# Patient Record
Sex: Male | Born: 1950 | Race: White | Hispanic: No | Marital: Married | Smoking: Former smoker
Health system: Southern US, Community
[De-identification: ages and names within clinical notes are randomized; demographics above are authoritative.]

## PROBLEM LIST (undated history)

## (undated) DIAGNOSIS — E669 Obesity, unspecified: Secondary | ICD-10-CM

## (undated) DIAGNOSIS — I2510 Atherosclerotic heart disease of native coronary artery without angina pectoris: Secondary | ICD-10-CM

## (undated) DIAGNOSIS — M1990 Unspecified osteoarthritis, unspecified site: Secondary | ICD-10-CM

## (undated) DIAGNOSIS — E559 Vitamin D deficiency, unspecified: Secondary | ICD-10-CM

## (undated) DIAGNOSIS — I10 Essential (primary) hypertension: Secondary | ICD-10-CM

## (undated) DIAGNOSIS — K219 Gastro-esophageal reflux disease without esophagitis: Secondary | ICD-10-CM

## (undated) DIAGNOSIS — E119 Type 2 diabetes mellitus without complications: Secondary | ICD-10-CM

## (undated) DIAGNOSIS — Z973 Presence of spectacles and contact lenses: Secondary | ICD-10-CM

## (undated) DIAGNOSIS — E785 Hyperlipidemia, unspecified: Secondary | ICD-10-CM

## (undated) DIAGNOSIS — G4733 Obstructive sleep apnea (adult) (pediatric): Secondary | ICD-10-CM

## (undated) HISTORY — DX: Vitamin D deficiency, unspecified: E55.9

## (undated) HISTORY — DX: Type 2 diabetes mellitus without complications: E11.9

## (undated) HISTORY — DX: Atherosclerotic heart disease of native coronary artery without angina pectoris: I25.10

## (undated) HISTORY — DX: Obstructive sleep apnea (adult) (pediatric): G47.33

## (undated) HISTORY — DX: Hyperlipidemia, unspecified: E78.5

## (undated) HISTORY — DX: Gastro-esophageal reflux disease without esophagitis: K21.9

## (undated) HISTORY — DX: Obesity, unspecified: E66.9

## (undated) HISTORY — DX: Essential (primary) hypertension: I10

## (undated) HISTORY — PX: POLYPECTOMY: SHX149

---

## 1999-08-18 ENCOUNTER — Encounter (INDEPENDENT_AMBULATORY_CARE_PROVIDER_SITE_OTHER): Payer: Self-pay | Admitting: Specialist

## 1999-08-18 ENCOUNTER — Other Ambulatory Visit: Admission: RE | Admit: 1999-08-18 | Discharge: 1999-08-18 | Payer: Self-pay | Admitting: Gastroenterology

## 2001-07-24 ENCOUNTER — Encounter: Payer: Self-pay | Admitting: Internal Medicine

## 2001-07-24 ENCOUNTER — Ambulatory Visit (HOSPITAL_COMMUNITY): Admission: RE | Admit: 2001-07-24 | Discharge: 2001-07-24 | Payer: Self-pay | Admitting: Internal Medicine

## 2002-07-30 ENCOUNTER — Encounter: Payer: Self-pay | Admitting: Internal Medicine

## 2002-07-30 ENCOUNTER — Ambulatory Visit (HOSPITAL_COMMUNITY): Admission: RE | Admit: 2002-07-30 | Discharge: 2002-07-30 | Payer: Self-pay | Admitting: Internal Medicine

## 2003-08-05 ENCOUNTER — Ambulatory Visit (HOSPITAL_COMMUNITY): Admission: RE | Admit: 2003-08-05 | Discharge: 2003-08-05 | Payer: Self-pay | Admitting: Internal Medicine

## 2003-08-15 HISTORY — PX: CARDIAC CATHETERIZATION: SHX172

## 2003-08-15 HISTORY — PX: OTHER SURGICAL HISTORY: SHX169

## 2003-12-18 ENCOUNTER — Ambulatory Visit (HOSPITAL_COMMUNITY): Admission: RE | Admit: 2003-12-18 | Discharge: 2003-12-19 | Payer: Self-pay | Admitting: Cardiology

## 2004-06-09 ENCOUNTER — Ambulatory Visit (HOSPITAL_COMMUNITY): Admission: RE | Admit: 2004-06-09 | Discharge: 2004-06-09 | Payer: Self-pay | Admitting: Cardiology

## 2004-08-04 ENCOUNTER — Ambulatory Visit (HOSPITAL_COMMUNITY): Admission: RE | Admit: 2004-08-04 | Discharge: 2004-08-04 | Payer: Self-pay | Admitting: Internal Medicine

## 2005-08-03 ENCOUNTER — Ambulatory Visit (HOSPITAL_COMMUNITY): Admission: RE | Admit: 2005-08-03 | Discharge: 2005-08-03 | Payer: Self-pay | Admitting: Cardiology

## 2006-05-11 ENCOUNTER — Ambulatory Visit: Payer: Self-pay | Admitting: Pulmonary Disease

## 2006-08-15 ENCOUNTER — Ambulatory Visit (HOSPITAL_COMMUNITY): Admission: RE | Admit: 2006-08-15 | Discharge: 2006-08-15 | Payer: Self-pay | Admitting: Internal Medicine

## 2007-08-27 ENCOUNTER — Ambulatory Visit (HOSPITAL_COMMUNITY): Admission: RE | Admit: 2007-08-27 | Discharge: 2007-08-27 | Payer: Self-pay | Admitting: Internal Medicine

## 2008-09-21 ENCOUNTER — Ambulatory Visit (HOSPITAL_COMMUNITY): Admission: RE | Admit: 2008-09-21 | Discharge: 2008-09-21 | Payer: Self-pay | Admitting: Internal Medicine

## 2010-09-28 ENCOUNTER — Ambulatory Visit (HOSPITAL_COMMUNITY)
Admission: RE | Admit: 2010-09-28 | Discharge: 2010-09-28 | Disposition: A | Discharge status: Home / Self Care | Payer: 59 | Source: Ambulatory Visit | Attending: Internal Medicine | Admitting: Internal Medicine

## 2010-09-28 ENCOUNTER — Other Ambulatory Visit (HOSPITAL_COMMUNITY): Payer: Self-pay | Admitting: Internal Medicine

## 2010-09-28 DIAGNOSIS — J4489 Other specified chronic obstructive pulmonary disease: Secondary | ICD-10-CM | POA: Insufficient documentation

## 2010-09-28 DIAGNOSIS — F17200 Nicotine dependence, unspecified, uncomplicated: Secondary | ICD-10-CM

## 2010-09-28 DIAGNOSIS — J449 Chronic obstructive pulmonary disease, unspecified: Secondary | ICD-10-CM | POA: Insufficient documentation

## 2010-12-30 NOTE — Cardiovascular Report (Signed)
NAME:  Joe Green, Joe Green                       ACCOUNT NO.:  [id number]   MEDICAL RECORD NO.:  [id number]                   PATIENT TYPE:  OIB   LOCATION:  6532                                 FACILITY:  [facility]   PHYSICIAN:  Armanda Magic, M.D.                  DATE OF BIRTH:  Nov 19, 1950   DATE OF PROCEDURE:  12/18/2003  DATE OF DISCHARGE:                              CARDIAC CATHETERIZATION   REFERRING PHYSICIAN:  Lovenia Kim, D.O.   PROCEDURES:  1. Left heart catheterization.  2. Coronary angiography.  3. Left ventriculography.   OPERATOR/CARDIOLOGIST:  Armanda Magic, M.D.   INDICATIONS:  Chest pain.   COMPLICATIONS:  None.   IV ACCESS:  Via right femoral artery, 6 French sheath.   HISTORY:  This is a very pleasant 60 year old white male who recently had  some atypical chest pain.  His cardiac risk factors include:  His age, sex  and hyperlipidemia as well as family history.  He presented for stress  Cardiolite study which showed a reversible inferior wall defect extending to  the apex. He now presents for cardiac catheterization.   DESCRIPTION OF PROCEDURE:  The patient was brought to the cardiac  catheterization laboratory in the fasting, nonsedated state. Informed  consent was obtained. The patient was connected to continuous heart rate,  pulse oximetry monitoring, and intermittent blood pressure monitoring. The  right groin was prepped and draped in a sterile fashion. Xylocaine 1% was  used for local anesthesia.   Using modified Seldinger technique, a 6-French sheath was placed in the  right femoral artery. Under fluoroscopic guidance a 6-French JL-4 catheter  was placed in the left coronary artery.  Multiple cine films were taken in  the 30-degree RAO and 40-degree LAO views.  This catheter was then exchanged  out over a guidewire for a 6-French JR-4 catheter, which was placed under  fluoroscopic guidance in the right coronary artery. Multiple cine films were  taken in 30-degree RAO and 40-degree LAO views.   This catheter was then exchanged out over a guidewire for a 6-French angled  pigtail catheter, which was placed under fluoroscopic guidance in the left  ventricular cavity. Left ventriculography was performed in the 30-degree RAO  view using a total of  30 cc of contrast at 15 cc/second.  The catheter was  then pulled back across the aortic valve with no significant gradient.  At  the end of the procedure the patient went on to PCI for Dr. Amil Amen of the  RCA.   RESULTS:  1. The left main coronary artery is widely patent and bifurcates into a left     anterior descending artery and left circumflex artery.  2. The left anterior descending artery gives right to a first diagonal     branch which has a mid-70% narrowing.  Just distal to the takeoff of the     first diagonal branch there is a  60% narrowing of the LAD; and then there     is a second diagonal branch that takes off which is widely patent. The     rest of the LAD traverses the AV groove and is widely patent.  3. The left circumflex gives off a first obtuse marginal branch which is     widely patent. The rest of the left circumflex traverses the AV groove.     It is diffusely diseased up to 40%.  There is evidence of left-to-right     collateral filling of part of the distal RCA.  4. Right coronary artery has a 95% mid-to-distal stenosis with questionable     thrombus.  The ongoing circumflex bifurcates into a posterior descending     artery and posterolateral artery both of which are widely patent.  5. Left ventriculography shows normal LV systolic function.  LV pressure     122/7 mmHg, aortic pressure 118/75 mmHg, LVEDP 12 mmHg.   ASSESSMENT:  1. 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  2. Normal LV function.   PLAN:  1. PCI of the RCA per Dr.  Amil Amen.  2. Aspirin and Plavix.  3. Continue Lipitor.                                               Armanda Magic, M.D.    TT/MEDQ  D:  12/18/2003  T:  12/19/2003  Job:  [id number]   cc:   Lovenia Kim, D.O.  [address]  [place]  [place] [address]  Fax: [phone]

## 2010-12-30 NOTE — Cardiovascular Report (Signed)
NAMEMarland Kitchen  Joe Green, Joe Green             ACCOUNT NO.:  [id number]   MEDICAL RECORD NO.:  [id number]          PATIENT TYPE:  OIB   LOCATION:  2899                         FACILITY:  [facility]   PHYSICIAN:  Armanda Magic, M.D.     DATE OF BIRTH:  [birth year]   DATE OF PROCEDURE:  08/03/2005  DATE OF DISCHARGE:                              CARDIAC CATHETERIZATION   PROCEDURE:  Left heart catheterization, coronary angiography, left  ventriculography.   OPERATOR:  Armanda Magic, M.D.   INDICATIONS:  Chest pain, abnormal Cardiolite.   COMPLICATIONS:  None.   IV ACCESS:  Via right femoral artery 6-French sheath.   This is a very pleasant 60 year old white male who is status post PTCA  stenting of the RCA back in 2005. He now presents with recurrent anginal  symptoms and a stress Cardiolite study showing partially reversible defect  in the inferior wall. He now presents for cardiac catheterization.   The patient is brought to cardiac catheterization laboratory in a fasting  nonsedated state. Informed consent was obtained. The patient was connected  to continuous heart rate and pulse oximeter monitoring and intermittent  blood pressure monitoring. The right groin was prepped and draped in sterile  fashion. Xylocaine 1% was used for local anesthesia. Using modified  Seldinger technique, a 6-French sheath was placed in right femoral artery.  Under fluoroscopic guidance a 6-French JL-4 catheter was placed in the left  coronary artery. Multiple cine films were taken in 30 degree RAO and 40  degree  LAO views.  This catheter was then exchanged out over a guidewire  for 6-French JR-4 catheter which was placed under fluoroscopic guidance in  right coronary artery. Multiple cine films were taken in 30 degree RAO and  40 degree LAO views. This catheter was then exchanged out over a guidewire  for 6-French angled pigtail catheter which was placed under fluoroscopic  guidance in the left ventricular cavity.  Left ventriculography was performed  in 30 degree RAO view using total of 30 mL of contrast at 15 mL per second.  Catheter was then pulled back across the aortic valve with no significant  gradient noted.  At the end of the procedure all catheters and sheaths were  removed. Manual compression was performed until adequate hemostasis was  obtained. The patient was transferred back to room in stable condition.   RESULTS:  1.  Left main coronary artery is widely patent and bifurcates into left      anterior descending artery and left circumflex artery. Left anterior      descending artery gives rise to a first diagonal branch which has an 80%      proximal narrowing.  The vessel is a moderate sized vessel. In between      the takeoff of the first and second diagonal there is a 60% narrowing of      the proximal to mid LAD, the second diagonal is widely patent and the      ongoing LAD is widely patent throughout its course at the apex.   Left circumflex is widely patent in its proximal and mid portions  giving  rise to a first diagonal branch which has a little luminal irregularities up  to 20%. This branch does bifurcate into two daughter branches both of which  are widely patent. Just distal to the takeoff of the obtuse marginal branch,  there is an aneurysmal dilatation with a 30% narrowing just prior to the  aneurysmal dilatation. The left circumflex then gives rise to a second  obtuse marginal branch which is widely patent. The ongoing circumflex  traverses the AV groove and is patent.   The right coronary artery is widely patent including the stent site and  bifurcates distally in a posterior descending artery posterior lateral  artery both of which are widely patent.   Left ventriculography shows normal LV systolic function. Aortic pressure  127/75 mmHg. LV pressure systolic 134 mmHg.   ASSESSMENT:  1.  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  2.  Normal left  ventricular function.  3.  Dyslipidemia.  4.  Hypertension.  5.  Diabetes mellitus.  6.  Chest pain, presumed secondary to obstructive diagonal disease.   PLAN:  Aggressive medical management. Continue aspirin.  Add Imdur 30 mg a  day. Continue current medications. Follow up with me in two weeks and we did  discuss smoking cessation.      Armanda Magic, M.D.  Electronically Signed     TT/MEDQ  D:  08/03/2005  T:  08/05/2005  Job:  [id number]   cc:   Lovenia Kim, D.O.  Fax: [phone]

## 2010-12-30 NOTE — Cardiovascular Report (Signed)
NAME:  Joe Green, Joe Green                       ACCOUNT NO.:  [id number]   MEDICAL RECORD NO.:  [id number]                   PATIENT TYPE:  OIB   LOCATION:  6532                                 FACILITY:  [facility]   PHYSICIAN:  Francisca December, M.D.               DATE OF BIRTH:  Feb 13, 1951   DATE OF PROCEDURE:  12/18/2003  DATE OF DISCHARGE:                              CARDIAC CATHETERIZATION   PROCEDURE PERFORMED:  1. Percutaneous coronary intervention/drug-eluting stent implantation mid     right coronary artery.  2. Percutaneous closure right femoral artery.   INDICATION:  [name] is a 60 year old man with atypical angina who  underwent recent myocardial perfusion study showing inferior ischemia.  Dr.  Armanda Magic has completed cardiac catheterization and coronary angiography  revealing a subtotal stenosis in the mid right coronary which is rather  diffuse in nature.  He is to undergo percutaneous coronary intervention at  this time.   PROCEDURAL NOTE:  Via the previously placed 6 French catheter sheath a 6  French #4 FR guiding catheter was advanced in the ascending aorta where the  right coronary os was engaged.  The patient received 45 units of heparin  intravenously as well as a bolus of  Aggrastat at 25 mg/kg and subsequent constant infusion.  The resulting ACT  was 286 seconds.  A 0.014 inch Scimed Luge intracoronary guide wire was  passed across the lesion in the mid right coronary without difficulty.  Initial balloon dilatation was performed with 3.0 x 20-mm Scimed Maverick  intracoronary balloon.  This was inflated to 8 atmospheres for approximately  one minute. This device was removed and replaced with a 3.5/32-mm Scimed  Taxus drug-eluting stent intracoronary stent.  This device was deployed at a  peak pressure of 16 atmospheres for approximately one minute. The balloon  was reinflated to 11 atmospheres for approximately 30 seconds to facilitate  removal.  The  balloon was subsequently removed successfully.  Adequate  patency was confirmed in orthogonal views both with and without the guide  wire in place.  The guiding catheter was then removed.  A right femoral  arteriogram was performed in a 45-degree angulation utilizing the arterial  sheath.  It documented femoral artery to be widely patent and the  arteriotomy site to be well above the bifurcation into the profunda femoris  and superficial femoral arteries.  I then proceeded with percutaneous  closure using the Angio-Seal system.  This was successful.  The patient was  transported to the recovery area in stable condition with an intact distal  pulse.   ANGIOGRAPHY:  As mentioned, the lesion treated was in the mid portion of the  right coronary and was 95% stenotic.  The lesion extended approximately 22-  25 mm throughout the mid portion of the right coronary.  Following balloon  dilatation and drug-eluting stent implantation, there was no residual  stenosis.   FINAL IMPRESSION:  1. Atherosclerotic coronary vascular disease, two vessel.  There is a 60 and     70% lesion in the LAD and diagonal     branch respectively.  2. Status post successful percutaneous coronary intervention/drug-eluting     stent implantation mid right coronary.  3. Successful percutaneous closure right femoral artery/Angio-Seal.                                               Francisca December, M.D.    JHE/MEDQ  D:  12/18/2003  T:  12/19/2003  Job:  [id number]   cc:   Lovenia Kim, D.O.  [address]  [place]  [place] [address]  Fax: [phone]

## 2010-12-30 NOTE — Cardiovascular Report (Signed)
NAMEMarland Kitchen  Joe Green, Joe Green             ACCOUNT NO.:  [id number]   MEDICAL RECORD NO.:  [id number]          PATIENT TYPE:  OIB   LOCATION:  2899                         FACILITY:  [facility]   PHYSICIAN:  Armanda Magic, M.D.     DATE OF BIRTH:  November 04, 1950   DATE OF PROCEDURE:  06/09/2004  DATE OF DISCHARGE:                              CARDIAC CATHETERIZATION   PROCEDURE:  Left heart catheterization, coronary angiography and left  ventriculography.   OPERATOR:  Armanda Magic, M.D.   INDICATIONS FOR PROCEDURE:  Chest pain and coronary artery disease.   COMPLICATIONS:  None.   IV ACCESS:  Via the right femoral artery, 6 French sheath.   BRIEF HISTORY:  This is a 60 year old white male who presented back in May  of 2005 with atypical chest pain and was found to have one vessel  obstructive coronary artery disease of the RCA with a 95% mid to distal  lesion and underwent PTCA and stenting of that lesion. He also had  borderline obstructive disease in the LAD and diagonal branches which were  medically managed at that time.  Since then, he has been complaining of  intermittent substernal chest pain similar to what he had in the past and  now presents for repeat heart catheterization.   DESCRIPTION OF PROCEDURE:  The patient was brought to the cardiac  catheterization laboratory in the fasting, non-sedated state.  Informed  consent was obtained.  The patient was connected to continuous heart rate  and pulse oximetry monitoring and intermittent blood pressure monitoring.  The right groin was prepped and draped in sterile fashion.  1% Xylocaine was  used for local anesthesia.  Using the modified Seldinger technique, a 6  French sheath was placed in the right femoral artery.  Under fluoroscopic  guidance, a 6 [place] JL4 catheter was placed in the left coronary artery.  Multiple cine films were taken in the 30 degree RAO and 40 degree LAO views.  This catheter was exchanged out of a guide wire for  a 6 [place] JR4 catheter  which was placed under fluoroscopic guidance into the right coronary artery.  Multiple cine films were taken in the 30 degree RAO and 40 degree LAO views.  This catheter was then exchanged out of a guide wire for a 6 French angled  pigtail catheter which was placed under fluoroscopic guidance in the left  ventricular cavity.  Left ventriculography was performed in the 30 degree  RAO views using a total of 30 mL of contrast at 15 mL per second.  The  catheter was then pulled back across the aortic valve with no significant  gradient noted.  At the end of the procedure the films were reviewed with  Dr. Amil Amen.   RESULTS:  1.  The left main coronary artery is widely patent and bifurcates into the      left anterior descending artery and left circumflex artery.  The left      anterior descending artery has a mid 60 to 70% narrowing in between the      takeoff of the first diagonal and the second  diagonal.  The first      diagonal has a 70 to 80% narrowing proximally.  The left circumflex      gives rise to OM1 which is widely patent.  The circumflex is widely      patent and gives rise to a second obtuse marginal branch which is widely      patent as well. The right coronary artery is widely patent throughout      its course including the mid to distal portion with a stent that appears      widely patent.  It bifurcates into the posterior descending artery and      posterolateral artery both of which are widely patent.  2.  The left ventricular systolic function is normal.  Aortic pressure is      121/64 mmHg.  The left ventricular pressure is 121/0 mmHg.  Left      ventricular end diastolic pressure is 12 mmHg.   ASSESSMENT:  1.  One vessel obstructive coronary artery disease of the diagonal with      borderline obstructive disease of the left anterior descending.  2.  Normal left ventricular function.  3.  Widely patent right coronary artery stent.   PLAN:   1.  Review of films with Dr. Amil Amen.  2.  Continue aspirin and Plavix.       TT/MEDQ  D:  06/09/2004  T:  06/09/2004  Job:  [id number]   cc:   Lovenia Kim, D.O.  [address]  [place]  [place] [address]  Fax: [phone]

## 2010-12-30 NOTE — Assessment & Plan Note (Signed)
[name]                             PULMONARY OFFICE NOTE   Joe Green, Joe Green                    MRN:          [id number]  DATE:05/11/2006                            DOB:          06-28-1951    Patient is a 60 year old gentleman who I have been asked to see for mild  obstructive sleep apnea.  The patient underwent nocturnal  polysomnography in August of 2007 and was starting to have a respiratory  disturbance in excess of 11 events per hour and O2 saturation as low as  83%.  He had no significant leg jerks.  Patient was found to only have  14 minutes less than 90% O2 saturation which is insignificant.  He  states that he typically gets to bed between 10 and 11 and gets up at  5:30 to start his day.  He has been noted to have snoring which is  greatly improved with using Breathe Right strips.  No one has ever  mentioned pauses in his breathing during sleep nor has he ever had  choking arousals.  The patient states that he is rested most of the time  whenever he arises.  He works as a Biochemist, clinical and  rarely has sleepiness in periods of inactivity.  He has rare dozing with  movies or TV.  He has no issues with driving short or long distances.  His weight is up about 5 lbs over the last two years.   PAST MEDICAL HISTORY:  1. Diabetes.  2. Dyslipidemia.  3. History of coronary disease with percutaneous intervention in 2005.   CURRENT MEDICATIONS:  Caduet 5/20 1 daily, Toprol XL 25 mg daily, Altace  5 mg daily, aspirin 1 daily.  PATIENT HAS NO KNOWN DRUG ALLERGIES.   SOCIAL HISTORY:  Married and he has children.  He has a history of  smoking one pack per day for 35 years.  He continues to smoke a pack a  day currently.   FAMILY HISTORY:  Noncontributory in first degree relatives.   REVIEW OF SYSTEMS:  As per History of Present Illness, also see patient  intake form documented in the chart.   PHYSICAL EXAMINATION:  In  general, he is an overweight male in no acute  distress.  Blood pressure is 114/72, pulse 57, temperature is 98.3,  weight is 212 lbs, he is 5' 11 tall, O2 saturation on room air is 96%.  HEENT: Pupils equal, round and reactive to light and accommodation,  extraocular muscles are intact. Nares showed mild deviation to the left.  Oropharynx shows elongation of the soft palate and uvula.  Neck is  supple without JVD or lymphadenopathies.  No palpable thyromegaly.  Chest reveals clear breath sounds throughout.  Cardiac exam reveals  regular rate and rhythm with a 1/6 systolic murmur.  Abdomen is soft and  nontender with good bowel sounds.  Rectal exam was not done and not  indicated.  Lower extremities could not be examined secondary to tall  boots that are difficult to remove for the patient.  Neurological: He is  alert and oriented with  no obvious observable motor defects.   IMPRESSION:  Mild obstructive sleep apnea with very little symptoms  during the day.  The patient feels that he is rested the majority of the  time and that this is having very little impact on his quality of life.  Had a long discussion with him about the pathophysiology of sleep apnea,  including the short term quality of life issues and the long term  cardiovascular issues.  At his current sleep apnea level, this should  have very little impact on his cardiovascular health.  I think the  patient has a choice between looking at some type of treatment versus  just trying to aggressively work on weight loss over the next six  months.  Because he is not very symptomatic and he feels he can lose the  weight, I think that would be the most acceptable solution.   PLAN:  1. Work on weight loss over the next six months.  2. The patient is to call me if he is not able to lose weight or if he      changes his mind and feels this is affecting his quality of life      more than he has thought.     Barbaraann Share,  MD,FCCP  Electronically Signed    [facility]/MedQ  DD: 07/26/2006  DT: 07/26/2006  Job #: [id number]   cc:   Armanda Magic, M.D.  Lovenia Kim, D.O.

## 2011-03-02 ENCOUNTER — Other Ambulatory Visit: Payer: Self-pay | Admitting: Internal Medicine

## 2011-03-02 DIAGNOSIS — R200 Anesthesia of skin: Secondary | ICD-10-CM

## 2011-03-02 DIAGNOSIS — R479 Unspecified speech disturbances: Secondary | ICD-10-CM

## 2011-03-03 ENCOUNTER — Ambulatory Visit
Admission: RE | Admit: 2011-03-03 | Discharge: 2011-03-03 | Disposition: A | Discharge status: Home / Self Care | Payer: BC Managed Care – PPO | Source: Ambulatory Visit | Attending: Internal Medicine | Admitting: Internal Medicine

## 2011-03-03 DIAGNOSIS — R479 Unspecified speech disturbances: Secondary | ICD-10-CM

## 2011-03-03 DIAGNOSIS — R200 Anesthesia of skin: Secondary | ICD-10-CM

## 2011-03-09 ENCOUNTER — Other Ambulatory Visit: Payer: Self-pay | Admitting: Internal Medicine

## 2011-03-09 DIAGNOSIS — I10 Essential (primary) hypertension: Secondary | ICD-10-CM

## 2011-03-13 ENCOUNTER — Ambulatory Visit
Admission: RE | Admit: 2011-03-13 | Discharge: 2011-03-13 | Disposition: A | Discharge status: Home / Self Care | Payer: BC Managed Care – PPO | Source: Ambulatory Visit | Attending: Internal Medicine | Admitting: Internal Medicine

## 2011-03-13 DIAGNOSIS — I10 Essential (primary) hypertension: Secondary | ICD-10-CM

## 2011-03-30 ENCOUNTER — Telehealth: Payer: Self-pay

## 2011-03-30 NOTE — Telephone Encounter (Signed)
Records received from [facility] gave to [facility], spoke with PT he would like to make a NP appt here w/ [facility]  03/30/11/km

## 2011-03-30 NOTE — Telephone Encounter (Deleted)
Records received from [facility], gave to [place] Pt need to make NP appt   03/30/11/km

## 2011-03-31 ENCOUNTER — Encounter: Payer: Self-pay | Admitting: Cardiovascular Disease

## 2011-03-31 ENCOUNTER — Encounter: Payer: Self-pay | Admitting: *Deleted

## 2011-03-31 ENCOUNTER — Ambulatory Visit (INDEPENDENT_AMBULATORY_CARE_PROVIDER_SITE_OTHER): Payer: BC Managed Care – PPO | Admitting: Cardiovascular Disease

## 2011-03-31 DIAGNOSIS — I10 Essential (primary) hypertension: Secondary | ICD-10-CM

## 2011-03-31 DIAGNOSIS — E782 Mixed hyperlipidemia: Secondary | ICD-10-CM

## 2011-03-31 DIAGNOSIS — I2510 Atherosclerotic heart disease of native coronary artery without angina pectoris: Secondary | ICD-10-CM

## 2011-03-31 DIAGNOSIS — Z01810 Encounter for preprocedural cardiovascular examination: Secondary | ICD-10-CM

## 2011-03-31 NOTE — Patient Instructions (Addendum)

## 2011-04-03 DIAGNOSIS — E1169 Type 2 diabetes mellitus with other specified complication: Secondary | ICD-10-CM | POA: Insufficient documentation

## 2011-04-03 DIAGNOSIS — E785 Hyperlipidemia, unspecified: Secondary | ICD-10-CM

## 2011-04-03 DIAGNOSIS — I2510 Atherosclerotic heart disease of native coronary artery without angina pectoris: Secondary | ICD-10-CM | POA: Insufficient documentation

## 2011-04-03 HISTORY — DX: Hyperlipidemia, unspecified: E78.5

## 2011-04-03 HISTORY — DX: Type 2 diabetes mellitus with other specified complication: E11.69

## 2011-04-03 NOTE — Assessment & Plan Note (Signed)
Well controlled.  Continue current medications and low sodium Dash type diet.    

## 2011-04-03 NOTE — Progress Notes (Signed)
60 yo previously followed by [facility] cardiology.  Not happy with them.  Known CAD.  Stent to RCA in 2005.  F/U cath 2006 with patent stent but 80% D1 and 60% mid LAD lesions.  Reviewed cath.  Medical Rx decided on.  Interestingly myovue suggested inferior wall ischemia and tightest lesions were anterior.  Increasing SSCP that is exertional over the last 2 months.  No rest pain.  Symptoms are anginal sounding.  Has had nitro with some relief.  Discussed need for heart cath given known CAD and new symptoms suggesting progression of left sided disease.  Lack of correlation of previous myovue to disease severity makes noninvasive approach also less appealing Risks including bleeding, MI, stroke and need for emergent surgery discussed. Will try to arrange this week.  ROS: Denies fever, malais, weight loss, blurry vision, decreased visual acuity, cough, sputum, SOB, hemoptysis, pleuritic pain, palpitaitons, heartburn, abdominal pain, melena, lower extremity edema, claudication, or rash.  All other systems reviewed and negative   General: Affect appropriate Healthy:  appears stated age HEENT: normal Neck supple with no adenopathy JVP normal no bruits no thyromegaly Lungs clear with no wheezing and good diaphragmatic motion Heart:  S1/S2 no murmur,rub, gallop or click PMI normal Abdomen: benighn, BS positve, no tenderness, no AAA no bruit.  No HSM or HJR Distal pulses intact with no bruits No edema Neuro non-focal Skin warm and dry No muscular weakness  Medications Current Outpatient Prescriptions  Medication Sig Dispense Refill  . amLODipine (NORVASC) 5 MG tablet Take 5 mg by mouth daily.        Marland Kitchen aspirin 325 MG EC tablet Take 325 mg by mouth daily.        . fish oil-omega-3 fatty acids 1000 MG capsule Take 1 g by mouth 3 (three) times daily.        . ramipril (ALTACE) 5 MG capsule Take 5 mg by mouth daily.          Allergies Review of patient's allergies indicates no known  allergies.  Family History: No family history on file.  Social History: History   Social History  . Marital Status: Married    Spouse Name: N/A    Number of Children: N/A  . Years of Education: N/A   Occupational History  . Not on file.   Social History Main Topics  . Smoking status: Current Everyday Smoker -- 0.5 packs/day for 40 years    Types: Cigarettes  . Smokeless tobacco: Not on file  . Alcohol Use: Yes  . Drug Use: No  . Sexually Active: Not on file   Other Topics Concern  . Not on file   Social History Narrative  . No narrative on file    Electrocardiogram:  Assessment and Plan

## 2011-04-03 NOTE — Assessment & Plan Note (Signed)
Continue fish oil.  F/U labs Will probably need statin

## 2011-04-03 NOTE — Assessment & Plan Note (Signed)
Known disease by cath 2006 See HPI.  Increasing anginal sounding pain.  Cath to be scheduled

## 2011-04-05 ENCOUNTER — Encounter: Payer: Self-pay | Admitting: *Deleted

## 2011-04-05 ENCOUNTER — Telehealth: Payer: Self-pay | Admitting: Cardiovascular Disease

## 2011-04-05 DIAGNOSIS — Z01810 Encounter for preprocedural cardiovascular examination: Secondary | ICD-10-CM

## 2011-04-05 NOTE — Telephone Encounter (Signed)
Spoke with pt, he is ready to schedule cath. He would like for any procedure he will need be done on the day of his cath, therefore he will be scheduled in the main cath lab. He is scheduled for Tuesday 04-18-11 @ 7:30am with dr Clifton James. He will go to [address] next week for labs and cxr. Instructions discuss over the phone and copy mailed to pt Joe Green

## 2011-04-05 NOTE — Telephone Encounter (Signed)
Pt calling to set up heart cath, would like asap after 04-15-11 as possible, please

## 2011-04-10 ENCOUNTER — Other Ambulatory Visit: Payer: BC Managed Care – PPO

## 2011-04-13 ENCOUNTER — Other Ambulatory Visit (INDEPENDENT_AMBULATORY_CARE_PROVIDER_SITE_OTHER): Payer: BC Managed Care – PPO

## 2011-04-13 ENCOUNTER — Ambulatory Visit (INDEPENDENT_AMBULATORY_CARE_PROVIDER_SITE_OTHER)
Admission: RE | Admit: 2011-04-13 | Discharge: 2011-04-13 | Disposition: A | Discharge status: Home / Self Care | Payer: BC Managed Care – PPO | Source: Ambulatory Visit | Attending: Cardiovascular Disease | Admitting: Cardiovascular Disease

## 2011-04-13 DIAGNOSIS — R079 Chest pain, unspecified: Secondary | ICD-10-CM

## 2011-04-13 DIAGNOSIS — Z01810 Encounter for preprocedural cardiovascular examination: Secondary | ICD-10-CM

## 2011-04-13 LAB — CBC WITH DIFFERENTIAL/PLATELET
Basophils Absolute: 0 10*3/uL (ref 0.0–0.1)
Hemoglobin: 16.3 g/dL (ref 13.0–17.0)
Lymphocytes Relative: 27 % (ref 12.0–46.0)
Monocytes Relative: 8.4 % (ref 3.0–12.0)
Platelets: 145 10*3/uL — ABNORMAL LOW (ref 150.0–400.0)
RDW: 12.6 % (ref 11.5–14.6)

## 2011-04-13 LAB — BASIC METABOLIC PANEL
GFR: 109.66 mL/min (ref >60.00)
Potassium: 4 mEq/L (ref 3.5–5.1)
Sodium: 137 mEq/L (ref 135–145)

## 2011-04-13 LAB — PROTIME-INR
INR: 0.9 ratio (ref 0.8–1.0)
Prothrombin Time: 9.9 s — ABNORMAL LOW (ref 10.2–12.4)

## 2011-04-18 ENCOUNTER — Ambulatory Visit (HOSPITAL_COMMUNITY)
Admission: RE | Admit: 2011-04-18 | Discharge: 2011-04-18 | Disposition: A | Discharge status: Home / Self Care | Payer: BC Managed Care – PPO | Source: Ambulatory Visit | Attending: Cardiovascular Disease | Admitting: Cardiovascular Disease

## 2011-04-18 DIAGNOSIS — I10 Essential (primary) hypertension: Secondary | ICD-10-CM | POA: Insufficient documentation

## 2011-04-18 DIAGNOSIS — E785 Hyperlipidemia, unspecified: Secondary | ICD-10-CM | POA: Insufficient documentation

## 2011-04-18 DIAGNOSIS — I2510 Atherosclerotic heart disease of native coronary artery without angina pectoris: Secondary | ICD-10-CM

## 2011-04-18 DIAGNOSIS — Z87891 Personal history of nicotine dependence: Secondary | ICD-10-CM | POA: Insufficient documentation

## 2011-04-18 LAB — GLUCOSE, CAPILLARY
Glucose-Capillary: 148 mg/dL — ABNORMAL HIGH (ref 70–99)
Glucose-Capillary: 159 mg/dL — ABNORMAL HIGH (ref 70–99)
Glucose-Capillary: 143 mg/dL — ABNORMAL HIGH (ref 70–99)

## 2011-04-18 NOTE — Cardiovascular Report (Signed)
NAME:  Joe Green, Joe Green NO.:  [id number]  MEDICAL RECORD NO.:  [id number]  LOCATION:  [facility]                         FACILITY:  [facility]  PHYSICIAN:  Verne Carrow, MDDATE OF BIRTH:  [birth year]  DATE OF PROCEDURE:  04/18/2011 DATE OF DISCHARGE:                           CARDIAC CATHETERIZATION   PRIMARY CARDIOLOGIST:  Theron Arista C. Eden Emms, MD, [facility]  PROCEDURES PERFORMED: 1. Left heart catheterization. 2. Selective coronary angiography. 3. Left ventricular angiogram.  OPERATOR:  Verne Carrow, MD  ARTERIAL ACCESS SITE:  Right radial artery.  INDICATION:  This is a 60 year old Caucasian male with a history of coronary artery disease, hypertension, and hyperlipidemia as well as former tobacco abuse who has had recent complaints of chest discomfort. The patient had been followed in the past by Dr. Eliott Nine of [facility].  Last catheterization was in December 2006 and showed moderate disease in the diagonal branch to the LAD as well as in the proximal LAD.  The patient has a stent in the right coronary artery that was placed in 2005.  Diagnostic catheterization was arranged for today. Myoview in the office showed possible inferior wall ischemia.  DETAILS OF PROCEDURE:  The patient was brought to the main cardiac catheterization laboratory after signing informed consent for the procedure.  An Freida Busman test was performed on the right wrist and was positive.  The right wrist was prepped and draped in a sterile fashion. A 1% lidocaine was used for local anesthesia.  A 5-French sheath was inserted into the right radial artery without difficulty.  Verapamil 3 mg was given after sheath insertion.  A 4500 units of intravenous heparin was given after sheath insertion.  Standard diagnostic catheters were used to perform selective coronary angiography.  A pigtail catheter was used to perform a left ventricular angiogram.  The sheath was removed here in the  cath lab and a Terumo hemostasis band was applied over the arteriotomy site.  There were no immediate complications.  The patient was taken to the recovery area in stable condition.  HEMODYNAMIC FINDINGS:  Central aortic pressure 107/60.  Left ventricular pressure 109/6/11.  ANGIOGRAPHIC FINDINGS: 1. The left main coronary artery had no evidence of disease. 2. The left anterior descending was a moderate-to-large sized vessel     that coursed to the apex.  The vessel did become relatively small     in its distal portion before it wrapped around the apex.  Proximal     vessel had diffuse 60% stenosis which appeared unchanged from prior     catheterization.  The distal vessel had a discrete 65% stenosis in     a relatively small-caliber portion of the vessel.  The diagonal     branch was small to moderate in size and has 60% stenosis.  None of     the lesions in the anterior system appeared to be changed since     last catheterization 7 years ago. 3. The circumflex artery had 20% plaque throughout its proximal     segment and 50% plaque throughout the mid segment.  First obtuse     marginal branch was large in caliber and had a 30% plaque. 4. The right coronary artery is  a large dominant vessel with 20%     proximal plaque.  There is a stent present in the midportion of     vessel that is patent with no restenosis.  There is mild diffuse     plaque in the distal portion as well as in the posterior descending     artery. 5. Left ventricular angiogram was performed in the RAO projection and     showed normal left ventricular systolic function with ejection     fraction of 65%.  IMPRESSION: 1. Stable triple-vessel coronary artery disease. 2. Normal left ventricular systolic function.  RECOMMENDATIONS:  At this time, I recommend continued medical management.  I do not think that any of the patient's lesions have progressed in a manner that would indicate a need for  percutaneous intervention.  The patient did not have any anterior wall ischemia on his Myoview.     Verne Carrow, MD     CM/MEDQ  D:  04/18/2011  T:  04/18/2011  Job:  [id number]  cc:   Noralyn Pick. Eden Emms, MD, [facility]  Electronically Signed by Verne Carrow MD on 04/18/2011 02:32:29 PM

## 2011-04-25 NOTE — Progress Notes (Signed)
Addended by: Judithe Modest D on: 04/25/2011 03:44 PM   Modules accepted: Orders

## 2011-08-25 ENCOUNTER — Encounter: Payer: Self-pay | Admitting: Gastroenterology

## 2011-09-06 ENCOUNTER — Ambulatory Visit (AMBULATORY_SURGERY_CENTER): Payer: BC Managed Care – PPO | Admitting: *Deleted

## 2011-09-06 ENCOUNTER — Encounter: Payer: Self-pay | Admitting: Gastroenterology

## 2011-09-06 VITALS — Ht 63.0 in | Wt 207.7 lb

## 2011-09-06 DIAGNOSIS — Z1211 Encounter for screening for malignant neoplasm of colon: Secondary | ICD-10-CM

## 2011-09-06 MED ORDER — MOVIPREP 100 G PO SOLR: ORAL | Status: DC

## 2011-09-20 ENCOUNTER — Other Ambulatory Visit: Payer: BC Managed Care – PPO | Admitting: Gastroenterology

## 2011-11-03 ENCOUNTER — Encounter: Payer: Self-pay | Admitting: Gastroenterology

## 2011-11-03 ENCOUNTER — Ambulatory Visit (AMBULATORY_SURGERY_CENTER): Payer: BC Managed Care – PPO | Admitting: Gastroenterology

## 2011-11-03 VITALS — BP 116/83 | HR 69 | Temp 96.9°F | Resp 19 | Ht 63.0 in | Wt 207.0 lb

## 2011-11-03 DIAGNOSIS — D126 Benign neoplasm of colon, unspecified: Secondary | ICD-10-CM

## 2011-11-03 DIAGNOSIS — Z860100 Personal history of colon polyps, unspecified: Secondary | ICD-10-CM

## 2011-11-03 DIAGNOSIS — Z1211 Encounter for screening for malignant neoplasm of colon: Secondary | ICD-10-CM

## 2011-11-03 LAB — GLUCOSE, CAPILLARY: Glucose-Capillary: 105 mg/dL — ABNORMAL HIGH (ref 70–99)

## 2011-11-03 MED ORDER — SODIUM CHLORIDE 0.9 % IV SOLN: 500.0000 mL | INTRAVENOUS | Status: DC

## 2011-11-03 NOTE — Op Note (Signed)
[facility] [address] [place], [place]  [address]  COLONOSCOPY PROCEDURE REPORT PATIENT:  Joe Green, Joe Green  MR#:  [id number] BIRTHDATE:  08/26/1950, 61 yrs. old  GENDER:  male ENDOSCOPIST:  Judie Petit T. Russella Dar, MD, [facility]  PROCEDURE DATE:  11/03/2011 PROCEDURE:  Colonoscopy with biopsy and snare polypectomy ASA CLASS:  Class II INDICATIONS:  1) surveillance and high-risk screening  2) history of pre-cancerous (adenomatous) colon polyps: 2001 MEDICATIONS:   These medications were titrated to patient response per physician's verbal order, Fentanyl 75 mcg IV, Versed 8 mg IV DESCRIPTION OF PROCEDURE:   After the risks benefits and alternatives of the procedure were thoroughly explained, informed consent was obtained.  Digital rectal exam was performed and revealed no abnormalities.   The LB [id number]-[id number] endoscope was introduced through the anus and advanced to the cecum, which was identified by both the appendix and ileocecal valve, without limitations.  The quality of the prep was good, using MoviPrep. The instrument was then slowly withdrawn as the colon was fully examined. <<PROCEDUREIMAGES>> FINDINGS:  A sessile polyp was found in the descending colon. It was 7 mm in size. Polyp was snared without cautery. Retrieval was successful.  A sessile polyp was found in the sigmoid colon. It was 4 mm in size. The polyp was removed using cold biopsy forceps. Moderate diverticulosis was found in the sigmoid to descending colon. Otherwise normal colonoscopy without other polyps, masses, vascular ectasias, or inflammatory changes.  Retroflexed views in the rectum revealed internal hemorrhoids, small. The time to cecum =  3  minutes. The scope was then withdrawn (time =  14.33  min) from the patient and the procedure completed.  COMPLICATIONS:  None  ENDOSCOPIC IMPRESSION: 1) 7 mm sessile polyp in the descending colon 2) 4 mm sessile polyp in the sigmoid colon 3) Moderate  diverticulosis in the sigmoid to descending colon 4) Internal hemorrhoids  RECOMMENDATIONS: 1) Await pathology results 2) High fiber diet with liberal fluid intake. 3) Repeat Colonoscopy in 5 years.  [name]. Russella Dar, MD, [facility]  CC:  Lucky Cowboy, MD  n. Rosalie DoctorVenita Lick. Jadier Rockers at 11/03/2011 10:54 AM  Karsten Fells, [phone]

## 2011-11-03 NOTE — Patient Instructions (Signed)
YOU HAD AN ENDOSCOPIC PROCEDURE TODAY AT THE [facility]: Refer to the procedure report that was given to you for any specific questions about what was found during the examination.  If the procedure report does not answer your questions, please call your gastroenterologist to clarify.  If you requested that your care partner not be given the details of your procedure findings, then the procedure report has been included in a sealed envelope for you to review at your convenience later.  YOU SHOULD EXPECT: Some feelings of bloating in the abdomen. Passage of more gas than usual.  Walking can help get rid of the air that was put into your GI tract during the procedure and reduce the bloating. If you had a lower endoscopy (such as a colonoscopy or flexible sigmoidoscopy) you may notice spotting of blood in your stool or on the toilet paper. If you underwent a bowel prep for your procedure, then you may not have a normal bowel movement for a few days.  DIET: Your first meal following the procedure should be a light meal and then it is ok to progress to your normal diet.  A half-sandwich or bowl of soup is an example of a good first meal.  Heavy or fried foods are harder to digest and may make you feel nauseous or bloated.  Likewise meals heavy in dairy and vegetables can cause extra gas to form and this can also increase the bloating.  Drink plenty of fluids but you should avoid alcoholic beverages for 24 hours.  ACTIVITY: Your care partner should take you home directly after the procedure.  You should plan to take it easy, moving slowly for the rest of the day.  You can resume normal activity the day after the procedure however you should NOT DRIVE or use heavy machinery for 24 hours (because of the sedation medicines used during the test).    SYMPTOMS TO REPORT IMMEDIATELY: A gastroenterologist can be reached at any hour.  During normal business hours, 8:30 AM to 5:00 PM Monday through Friday,  call [phone].  After hours and on weekends, please call the GI answering service at [phone] who will take a message and have the physician on call contact you.   Following lower endoscopy (colonoscopy or flexible sigmoidoscopy):  Excessive amounts of blood in the stool  Significant tenderness or worsening of abdominal pains  Swelling of the abdomen that is new, acute  Fever of 100F or higher    FOLLOW UP: If any biopsies were taken you will be contacted by phone or by letter within the next 1-3 weeks.  Call your gastroenterologist if you have not heard about the biopsies in 3 weeks.  Our staff will call the home number listed on your records the next business day following your procedure to check on you and address any questions or concerns that you may have at that time regarding the information given to you following your procedure. This is a courtesy call and so if there is no answer at the home number and we have not heard from you through the emergency physician on call, we will assume that you have returned to your regular daily activities without incident.  SIGNATURES/CONFIDENTIALITY: You and/or your care partner have signed paperwork which will be entered into your electronic medical record.  These signatures attest to the fact that that the information above on your After Visit Summary has been reviewed and is understood.  Full responsibility of the confidentiality   of this discharge information lies with you and/or your care-partner.     

## 2011-11-03 NOTE — Progress Notes (Signed)
Patient did not have preoperative order for IV antibiotic SSI prophylaxis. (G8918)  Patient did not experience any of the following events: a burn prior to discharge; a fall within the facility; wrong site/side/patient/procedure/implant event; or a hospital transfer or hospital admission upon discharge from the facility. (G8907)  

## 2011-11-06 ENCOUNTER — Telehealth: Payer: Self-pay

## 2011-11-06 NOTE — Telephone Encounter (Signed)
  Follow up Call-  Call back number 11/03/2011  Post procedure Call Back phone  # [phone]  Permission to leave phone message Yes     Patient questions:  Do you have a fever, pain , or abdominal swelling? no Pain Score  0 *  Have you tolerated food without any problems? yes  Have you been able to return to your normal activities? yes  Do you have any questions about your discharge instructions: Diet   no Medications  no Follow up visit  no  Do you have questions or concerns about your Care? no  Actions: * If pain score is 4 or above: No action needed, pain <4.  Per the pt "the nurses are wonderful up there". Maw

## 2011-11-07 ENCOUNTER — Encounter: Payer: Self-pay | Admitting: Gastroenterology

## 2011-11-13 HISTORY — PX: COLONOSCOPY: SHX174

## 2012-08-13 ENCOUNTER — Encounter: Payer: Self-pay | Admitting: Cardiovascular Disease

## 2012-10-03 ENCOUNTER — Other Ambulatory Visit (HOSPITAL_COMMUNITY): Payer: Self-pay | Admitting: Physician Assistant

## 2012-10-03 ENCOUNTER — Ambulatory Visit (HOSPITAL_COMMUNITY)
Admission: RE | Admit: 2012-10-03 | Discharge: 2012-10-03 | Disposition: A | Discharge status: Home / Self Care | Payer: BC Managed Care – PPO | Source: Ambulatory Visit | Attending: Physician Assistant | Admitting: Physician Assistant

## 2012-10-03 DIAGNOSIS — F17200 Nicotine dependence, unspecified, uncomplicated: Secondary | ICD-10-CM | POA: Insufficient documentation

## 2012-10-03 DIAGNOSIS — J449 Chronic obstructive pulmonary disease, unspecified: Secondary | ICD-10-CM | POA: Insufficient documentation

## 2012-10-03 DIAGNOSIS — Z1231 Encounter for screening mammogram for malignant neoplasm of breast: Secondary | ICD-10-CM

## 2012-10-03 DIAGNOSIS — J4489 Other specified chronic obstructive pulmonary disease: Secondary | ICD-10-CM | POA: Insufficient documentation

## 2012-10-03 DIAGNOSIS — Z0000 Encounter for general adult medical examination without abnormal findings: Secondary | ICD-10-CM | POA: Insufficient documentation

## 2012-12-26 ENCOUNTER — Telehealth: Payer: Self-pay | Admitting: Cardiovascular Disease

## 2012-12-26 NOTE — Telephone Encounter (Signed)
New problem   Pt states he's been having some chest pains & wants to know if he needs to come in

## 2012-12-26 NOTE — Telephone Encounter (Signed)
SPOKE WITH  PT  HAS BEEN COMPLAINING WITH  SOME CHEST PAIN RADIATING TO ARM  FOR THE LAST  FEW DAYS  SAYS  PAIN IS  DIFFER THAN WHEN HAD STENT  DONE    APPT  SCHEDULED WITH SCOTT WEAVER  [facility] FOR  01/21/13  8:50 AM  ENCOURAGED PT IF S/S  BECOME MORE FREQUENT AND SEVERE TO GO TO ER  PT VERBALIZED UNDERSTANDING. WILL FORWARD TO DR Eden Emms FOR REVIEW /CY

## 2013-01-21 ENCOUNTER — Encounter: Payer: Self-pay | Admitting: Physician Assistant

## 2013-01-21 ENCOUNTER — Ambulatory Visit (INDEPENDENT_AMBULATORY_CARE_PROVIDER_SITE_OTHER): Payer: BC Managed Care – PPO | Admitting: Physician Assistant

## 2013-01-21 VITALS — BP 126/70 | HR 65 | Ht 69.0 in | Wt 210.4 lb

## 2013-01-21 DIAGNOSIS — I10 Essential (primary) hypertension: Secondary | ICD-10-CM

## 2013-01-21 DIAGNOSIS — F17200 Nicotine dependence, unspecified, uncomplicated: Secondary | ICD-10-CM

## 2013-01-21 DIAGNOSIS — I2510 Atherosclerotic heart disease of native coronary artery without angina pectoris: Secondary | ICD-10-CM

## 2013-01-21 DIAGNOSIS — E782 Mixed hyperlipidemia: Secondary | ICD-10-CM

## 2013-01-21 DIAGNOSIS — R079 Chest pain, unspecified: Secondary | ICD-10-CM

## 2013-01-21 DIAGNOSIS — Z720 Tobacco use: Secondary | ICD-10-CM

## 2013-01-21 NOTE — Progress Notes (Signed)
[address] [place], [place]  [address] Phone: [phone] Fax:  [phone]  Date:  01/21/2013   ID:  Joe Green, DOB 05/09/1951, MRN [id number]  PCP:  Nadean Corwin, MD  Cardiologist:  Dr. Charlton Haws     History of Present Illness: Joe Green is a 62 y.o. male who returns for evaluation of chest pain.  He has a hx of CAD, s/p stent to RCA in 2005, DM2, HTN, HL.  Last cath in 2006 after myoview that demonstrated inf ischemia was notable for a patent RCA stent but D1 80%, mLAD 60%.  Patient last seen by Dr. Eden Emms 03/2011. He was having increasing pain suggestive of angina. Cardiac catheterization was arranged. LHC 04/18/11: Proximal LAD 60% (unchanged from previous catheterization), distal LAD 65% (small caliber), Dx 60%, proximal CFX 20%, mid CFX 50%, OM1 30%, proximal RCA 20%, mid RCA stent patent, EF 65%. Films were reviewed from 2006 and there were no significant changes. PCI was not indicated. Medical therapy was continued at that time.  Patient had done well after his LHC in 2012 until 1-2 months ago. He started to note left-sided chest pressure. This would last for hours. It was not brought on by exertion. He had some pain in his left arm.  No assoc nausea, diaphoresis, dyspnea.  He did not take NTG.  His symptoms have since resolved. He is quite active.  He does Holiday representative work.  He denies exertional chest pain or shortness of breath at this time. He denies orthopnea, PND or edema. He denies syncope.   Labs (8/12):  K 4, Cr 0.8, Hgb 16.3  Wt Readings from Last 3 Encounters:  01/21/13 210 lb 6.4 oz (95.437 kg)  11/03/11 207 lb (93.895 kg)  09/06/11 207 lb 11.2 oz (94.212 kg)     Past Medical History  Diagnosis Date  . Hypertension   . Diabetes mellitus   . HLD (hyperlipidemia)   . CAD (coronary artery disease)     a. s/p stent to RCA 2005; b. LHC 2006 after Nuc with inf ischemia: patent RCA stent, D1 80%, LAD 60% => med Rx;  c. LHC  04/18/11: Proximal LAD 60% (unchanged from previous catheterization), distal LAD 65% (small caliber), Dx 60%, proximal CFX 20%, mid CFX 50%, OM1 30%, proximal RCA 20%, mid RCA stent patent, EF 65%.=> med Rx    Current Outpatient Prescriptions  Medication Sig Dispense Refill  . aspirin 325 MG EC tablet Take 325 mg by mouth daily.        Marland Kitchen doxazosin (CARDURA) 8 MG tablet       . glimepiride (AMARYL) 4 MG tablet Take 1 tablet by mouth Daily.      Marland Kitchen HYDROcodone-acetaminophen (NORCO) 7.5-325 MG per tablet as needed.       . metFORMIN (GLUCOPHAGE) 850 MG tablet Take 1 tablet by mouth Twice daily.      . Plant Sterols and Stanols (CHOLEST OFF PO) Take by mouth.      . ramipril (ALTACE) 5 MG capsule Take 5 mg by mouth daily.        . rosuvastatin (CRESTOR) 10 MG tablet Take 10 mg by mouth 3 (three) times a week. On Monday, Wednesday, Friday      . Vitamin D, Ergocalciferol, (DRISDOL) 50000 UNITS CAPS Take 5,000 Units by mouth.        Current Facility-Administered Medications  Medication Dose Route Frequency Provider Last Rate Last Dose  . 0.9 %  sodium chloride  infusion  500 mL Intravenous Continuous Meryl Dare, MD        Allergies:   No Known Allergies  Social History:  The patient  reports that he has been smoking Cigarettes.  He has a 20 pack-year smoking history. He does not have any smokeless tobacco history on file. He reports that  drinks alcohol. He reports that he does not use illicit drugs.   ROS:  Please see the history of present illness.      All other systems reviewed and negative.   PHYSICAL EXAM: VS:  BP 126/70  Pulse 65  Ht 5\' 9"  (1.753 m)  Wt 210 lb 6.4 oz (95.437 kg)  BMI 31.06 kg/m2  SpO2 95% Well nourished, well developed, in no acute distress HEENT: normal Neck: no JVD Vascular:  No carotid bruits Endocrine: no TM Cardiac:  normal S1, S2; RRR; no murmur Lungs:  clear to auscultation bilaterally, no wheezing, rhonchi or rales Abd: soft, nontender, no  hepatomegaly Ext: no edema Skin: warm and dry Neuro:  CNs 2-12 intact, no focal abnormalities noted  EKG:  NSR, HR 61, RBBB, no changes     ASSESSMENT AND PLAN:  1. Chest Pain:  This is resolved.  It was atypical.  He is quite active and has had not chest discomfort with heavy exertion.  He is on good medical Rx.  He is not on a beta blocker but would not tolerate this as his HR is already 60 and he has conduction system disease with RBBB.  Continue current Rx.  No cardiac workup at this time. 2. CAD:  Continue ASA and statin. 3. Hypertension:  Controlled.  Continue current therapy.  4. Hyperlipidemia:  Managed by PCP. He is intol to statins but has been able to take crestor 10 mg MWF. 5. Tobacco Abuse:  We discussed the importance of cessation and different strategies for quitting.    6. Disposition:  F/u 6 mos with Dr. Charlton Haws or sooner PRN.  Signed, Tereso Newcomer, PA-C  01/21/2013 9:14 AM

## 2013-01-21 NOTE — Patient Instructions (Addendum)
Your physician wants you to follow-up in: 6 MONTHS WITH DR. Eden Emms. You will receive a reminder letter in the mail two months in advance. If you don't receive a letter, please call our office to schedule the follow-up appointment.   NO CHANGES WERE MADE TODAY

## 2013-07-07 ENCOUNTER — Encounter: Payer: Self-pay | Admitting: Internal Medicine

## 2013-07-07 DIAGNOSIS — G4733 Obstructive sleep apnea (adult) (pediatric): Secondary | ICD-10-CM | POA: Insufficient documentation

## 2013-07-07 DIAGNOSIS — J449 Chronic obstructive pulmonary disease, unspecified: Secondary | ICD-10-CM | POA: Insufficient documentation

## 2013-07-07 DIAGNOSIS — I10 Essential (primary) hypertension: Secondary | ICD-10-CM | POA: Insufficient documentation

## 2013-07-08 ENCOUNTER — Encounter: Payer: Self-pay | Admitting: Physician Assistant

## 2013-07-08 ENCOUNTER — Ambulatory Visit: Payer: Self-pay | Admitting: Internal Medicine

## 2013-07-08 ENCOUNTER — Ambulatory Visit: Payer: BC Managed Care – PPO | Admitting: Physician Assistant

## 2013-07-08 VITALS — BP 138/78 | HR 72 | Temp 98.2°F | Resp 16 | Ht 69.0 in | Wt 213.0 lb

## 2013-07-08 DIAGNOSIS — E119 Type 2 diabetes mellitus without complications: Secondary | ICD-10-CM

## 2013-07-08 DIAGNOSIS — E782 Mixed hyperlipidemia: Secondary | ICD-10-CM

## 2013-07-08 DIAGNOSIS — Z79899 Other long term (current) drug therapy: Secondary | ICD-10-CM

## 2013-07-08 DIAGNOSIS — E559 Vitamin D deficiency, unspecified: Secondary | ICD-10-CM

## 2013-07-08 DIAGNOSIS — I10 Essential (primary) hypertension: Secondary | ICD-10-CM

## 2013-07-08 LAB — CBC WITH DIFFERENTIAL/PLATELET
Basophils Absolute: 0 10*3/uL (ref 0.0–0.1)
Basophils Relative: 0 % (ref 0–1)
Eosinophils Relative: 5 % (ref 0–5)
HCT: 46.2 % (ref 39.0–52.0)
Hemoglobin: 16.1 g/dL (ref 13.0–17.0)
Lymphs Abs: 2.7 10*3/uL (ref 0.7–4.0)
MCH: 31.9 pg (ref 26.0–34.0)
MCHC: 34.8 g/dL (ref 30.0–36.0)
MCV: 91.5 fL (ref 78.0–100.0)
Monocytes Absolute: 0.7 10*3/uL (ref 0.1–1.0)
Monocytes Relative: 7 % (ref 3–12)
Neutro Abs: 5 10*3/uL (ref 1.7–7.7)
RDW: 13 % (ref 11.5–15.5)

## 2013-07-08 LAB — BASIC METABOLIC PANEL WITH GFR
BUN: 10 mg/dL (ref 6–23)
CO2: 24 mEq/L (ref 19–32)
Chloride: 103 mEq/L (ref 96–112)
Creat: 0.73 mg/dL (ref 0.50–1.35)
GFR, Est Non African American: >89 mL/min
Glucose, Bld: 140 mg/dL — ABNORMAL HIGH (ref 70–99)
Potassium: 4.1 mEq/L (ref 3.5–5.3)

## 2013-07-08 LAB — HEPATIC FUNCTION PANEL
ALT: 29 U/L (ref 0–53)
AST: 17 U/L (ref 0–37)
Bilirubin, Direct: 0.1 mg/dL (ref 0.0–0.3)
Total Protein: 6.7 g/dL (ref 6.0–8.3)

## 2013-07-08 LAB — LIPID PANEL
Cholesterol: 164 mg/dL (ref 0–200)
LDL Cholesterol: 94 mg/dL (ref 0–99)
Total CHOL/HDL Ratio: 4 Ratio
Triglycerides: 143 mg/dL (ref <150)
VLDL: 29 mg/dL (ref 0–40)

## 2013-07-08 LAB — HEMOGLOBIN A1C
Hgb A1c MFr Bld: 7.4 % — ABNORMAL HIGH (ref <5.7)
Mean Plasma Glucose: 166 mg/dL — ABNORMAL HIGH (ref <117)

## 2013-07-08 LAB — MAGNESIUM: Magnesium: 1.8 mg/dL (ref 1.5–2.5)

## 2013-07-08 MED ORDER — ROSUVASTATIN CALCIUM 20 MG PO TABS: ORAL_TABLET | ORAL | Status: DC

## 2013-07-08 NOTE — Progress Notes (Signed)
HPI Patient presents for 3 month follow up with hypertension, hyperlipidemia, prediabetes and vitamin D. Patient's blood pressure has been controlled at home. Patient denies chest pain, shortness of breath, dizziness.  Patient's cholesterol is diet controlled. In addition they are on crestor however he has been off for several months, with his heart history we will prescribe it again. LDL was 57 The patient has been working on diet and exercise for diabetes, and denies changes in vision, polys, and paresthesias.  A1C 6.5 Hypomagnesemia 1.7 Patient is on Vitamin D supplement.  Vitamin D 64 Current Medications:  Current Outpatient Prescriptions on File Prior to Visit  Medication Sig Dispense Refill  . aspirin 325 MG EC tablet Take 325 mg by mouth daily.        Marland Kitchen doxazosin (CARDURA) 8 MG tablet       . glimepiride (AMARYL) 4 MG tablet Take 1 tablet by mouth Daily.      . metFORMIN (GLUCOPHAGE) 850 MG tablet Take 1 tablet by mouth Twice daily.      . Plant Sterols and Stanols (CHOLEST OFF PO) Take by mouth.      . ramipril (ALTACE) 5 MG capsule Take 5 mg by mouth daily.        . Vitamin D, Ergocalciferol, (DRISDOL) 50000 UNITS CAPS Take 5,000 Units by mouth.         Medical History:  Past Medical History  Diagnosis Date  . CAD (coronary artery disease)     a. s/p stent to RCA 2005; b. LHC 2006 after Nuc with inf ischemia: patent RCA stent, D1 80%, LAD 60% => med Rx;  c. LHC 04/18/11: Proximal LAD 60% (unchanged from previous catheterization), distal LAD 65% (small caliber), Dx 60%, proximal CFX 20%, mid CFX 50%, OM1 30%, proximal RCA 20%, mid RCA stent patent, EF 65%.=> med Rx  . Diabetes mellitus   . HLD (hyperlipidemia)   . Hypertension   . OSA (obstructive sleep apnea)    Allergies: No Known Allergies  ROS Constitutional: Denies fever, chills, weight loss/gain, headaches, insomnia, fatigue, night sweats, and change in appetite. Eyes: Denies redness, blurred vision, diplopia, discharge,  itchy, watery eyes.  ENT: Denies discharge, congestion, post nasal drip, sore throat, earache, dental pain, Tinnitus, Vertigo, Sinus pain, snoring.  Cardio: Denies chest pain, palpitations, irregular heartbeat,  dyspnea, diaphoresis, orthopnea, PND, claudication, edema Respiratory: denies cough, dyspnea,pleurisy, hoarseness, wheezing.  Gastrointestinal: Denies dysphagia, heartburn,  water brash, pain, cramps, nausea, vomiting, bloating, diarrhea, constipation, hematemesis, melena, hematochezia,  hemorrhoids Genitourinary: Denies dysuria, frequency, urgency, nocturia, hesitancy, discharge, hematuria, flank pain Musculoskeletal: Denies arthralgia, myalgia, stiffness, Jt. Swelling, pain, limp, and strain/sprain. Skin: Denies pruritis, rash, hives, warts, acne, eczema, changing in skin lesion Neuro: Weakness, tremor, incoordination, spasms, paresthesia, pain Psychiatric: Denies confusion, memory loss, sensory loss Endocrine: Denies change in weight, skin, hair change, nocturia, and paresthesia, Diabetic Polys, visual blurring, hyper /hypo glycemic episodes.  Heme/Lymph: Excessive bleeding, bruising, enlarged lymph nodes  Family history- Review and unchanged Social history- Review and unchanged Physical Exam: Filed Vitals:   07/08/13 1111  BP: 138/78  Pulse: 72  Temp: 98.2 F (36.8 C)  Resp: 16   Filed Weights   07/08/13 1111  Weight: 213 lb (96.616 kg)   General Appearance: Well nourished, in no apparent distress. Eyes: PERRLA, EOMs, conjunctiva no swelling or erythema, normal fundi and vessels. Sinuses: No Frontal/maxillary tenderness ENT/Mouth: Ext aud canals clear, with TMs without erythema, bulging.No erythema, swelling, or exudate on post pharynx.  Tonsils not swollen  or erythematous. Hearing normal.  Neck: Supple, thyroid normal.  Respiratory: Respiratory effort normal, BS equal bilaterally without rales, rhonchi, wheezing or stridor.  Cardio: Heart sounds normal, regular rate and  rhythm without murmurs, rubs or gallops. Peripheral pulses brisk and equal bilaterally, without edema.  Abdomen: Flat, soft, with bowel sounds. Non tender, no guarding, rebound, hernias, masses, or organomegaly.  Lymphatics: Non tender without lymphadenopathy.  Musculoskeletal: Full ROM all peripheral extremities, joint stability, 5/5 strength, and normal gait. Skin: Warm, dry without rashes, lesions, ecchymosis.  Neuro: Cranial nerves intact, reflexes equal bilaterally. Normal muscle tone, no cerebellar symptoms. Sensation intact.  Psych: Awake and oriented X 3, normal affect, Insight and Judgment appropriate.   Assessment and Plan:  Hypertension: Continue medication, monitor blood pressure at home. Continue DASH diet. Cholesterol: Continue diet and exercise. Check cholesterol. Crestor 20 mg sent in will do 1/2 pill M,W,F diabetes-Continue diet and exercise. Check A1C Vitamin D Def- check level and continue medications.  Mag- check mag Smoking- discussed smoking cessation and patient is ready to quit, he would like to try gum  Quentin Mulling 11:21 AM

## 2013-07-08 NOTE — Patient Instructions (Signed)

## 2013-07-09 LAB — VITAMIN D 25 HYDROXY (VIT D DEFICIENCY, FRACTURES): Vit D, 25-Hydroxy: 58 ng/mL (ref 30–89)

## 2013-07-09 LAB — INSULIN, FASTING: Insulin fasting, serum: 10 u[IU]/mL (ref 3–28)

## 2013-07-21 ENCOUNTER — Ambulatory Visit (INDEPENDENT_AMBULATORY_CARE_PROVIDER_SITE_OTHER): Payer: BC Managed Care – PPO | Admitting: Cardiovascular Disease

## 2013-07-21 ENCOUNTER — Encounter: Payer: Self-pay | Admitting: Cardiovascular Disease

## 2013-07-21 VITALS — BP 120/68 | HR 80 | Ht 71.0 in | Wt 215.4 lb

## 2013-07-21 DIAGNOSIS — F17200 Nicotine dependence, unspecified, uncomplicated: Secondary | ICD-10-CM

## 2013-07-21 DIAGNOSIS — E119 Type 2 diabetes mellitus without complications: Secondary | ICD-10-CM

## 2013-07-21 DIAGNOSIS — I2510 Atherosclerotic heart disease of native coronary artery without angina pectoris: Secondary | ICD-10-CM

## 2013-07-21 DIAGNOSIS — I10 Essential (primary) hypertension: Secondary | ICD-10-CM

## 2013-07-21 DIAGNOSIS — E782 Mixed hyperlipidemia: Secondary | ICD-10-CM

## 2013-07-21 NOTE — Assessment & Plan Note (Addendum)
Discussed low carb diet.  Target hemoglobin A1c is 6.5 or less.  Continue current medications. Needs to see a nutritionist Referral made poor insight into low carb ddiet

## 2013-07-21 NOTE — Progress Notes (Signed)
Patient ID: Joe Green, male   DOB: [birth year], 62 y.o.   MRN: [id number] Joe Green is a 62 y.o. male who returns for evaluation of chest pain. He has a hx of CAD, s/p stent to RCA in 2005, DM2, HTN, HL. Last cath in 2006 after myoview that demonstrated inf ischemia was notable for a patent RCA stent but D1 80%, mLAD 60%. Patient last seen by me  03/2011. He was having increasing pain suggestive of angina. Cardiac catheterization was arranged. LHC 04/18/11: Proximal LAD 60% (unchanged from previous catheterization), distal LAD 65% (small caliber), Dx 60%, proximal CFX 20%, mid CFX 50%, OM1 30%, proximal RCA 20%, mid RCA stent patent, EF 65%. Films were reviewed from 2006 and there were no significant changes. PCI was not indicated. Medical therapy was continued at that time. Patient had done well after his LHC in 2012   Recent LDL 94  And A1c 7.4   Still smoking Counseled for less than 10 minutes Diet is poor with too much bread.  Admits to not taking crestor  ROS: Denies fever, malais, weight loss, blurry vision, decreased visual acuity, cough, sputum, SOB, hemoptysis, pleuritic pain, palpitaitons, heartburn, abdominal pain, melena, lower extremity edema, claudication, or rash.  All other systems reviewed and negative  General: Affect appropriate Healthy:  appears stated age HEENT: normal Neck supple with no adenopathy JVP normal no bruits no thyromegaly Lungs clear with no wheezing and good diaphragmatic motion Heart:  S1/S2 no murmur, no rub, gallop or click PMI normal Abdomen: benighn, BS positve, no tenderness, no AAA no bruit.  No HSM or HJR Distal pulses intact with no bruits No edema Neuro non-focal Skin warm and dry No muscular weakness   Current Outpatient Prescriptions  Medication Sig Dispense Refill  . aspirin 325 MG EC tablet Take 325 mg by mouth daily.        Marland Kitchen doxazosin (CARDURA) 8 MG tablet       . glimepiride (AMARYL) 4 MG tablet Take 1 tablet by mouth Daily.       . metFORMIN (GLUCOPHAGE) 850 MG tablet Take 1 tablet by mouth Twice daily.      . Plant Sterols and Stanols (CHOLEST OFF PO) Take by mouth.      . ramipril (ALTACE) 5 MG capsule Take 5 mg by mouth daily.        . rosuvastatin (CRESTOR) 20 MG tablet Once daily  30 tablet  0  . Vitamin D, Ergocalciferol, (DRISDOL) 50000 UNITS CAPS Take 5,000 Units by mouth.        Current Facility-Administered Medications  Medication Dose Route Frequency Provider Last Rate Last Dose  . 0.9 %  sodium chloride infusion  500 mL Intravenous Continuous Meryl Dare, MD        Allergies  Review of patient's allergies indicates no known allergies.  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

## 2013-07-21 NOTE — Patient Instructions (Signed)
Your physician recommends that you schedule a follow-up appointment in:   3  MONTHS WITH DR [facility]  Your physician recommends that you continue on your current medications as directed. Please refer to the Current Medication list given to you today.  You have been referred to [facility]

## 2013-07-21 NOTE — Assessment & Plan Note (Addendum)
Seen note from primary crestor qod repeat labs 6 months Compliance is an issue Samples given

## 2013-07-21 NOTE — Assessment & Plan Note (Signed)
Well controlled.  Continue current medications and low sodium Dash type diet.    

## 2013-07-21 NOTE — Assessment & Plan Note (Signed)
Stable with no angina and good activity level.  Continue medical Rx  

## 2013-07-21 NOTE — Assessment & Plan Note (Signed)
Counseled for less than 10 minutes little motivation to quit 

## 2013-07-23 ENCOUNTER — Encounter: Payer: BC Managed Care – PPO | Attending: Cardiovascular Disease | Admitting: Dietician

## 2013-07-23 ENCOUNTER — Encounter: Payer: Self-pay | Admitting: Dietician

## 2013-07-23 VITALS — Ht 71.0 in | Wt 210.9 lb

## 2013-07-23 DIAGNOSIS — Z713 Dietary counseling and surveillance: Secondary | ICD-10-CM | POA: Insufficient documentation

## 2013-07-23 DIAGNOSIS — E119 Type 2 diabetes mellitus without complications: Secondary | ICD-10-CM

## 2013-07-23 NOTE — Patient Instructions (Signed)
Goals:  Follow Diabetes Meal Plan as instructed  Eat 3 meals and 2 snacks, every 3-5 hrs  Limit carbohydrate intake to 45-60 grams carbohydrate/meal  Limit carbohydrate intake to 0-15 grams carbohydrate/snack  Add lean protein foods to meals/snacks  Monitor glucose levels as instructed by your doctor  Aim for 30 mins of physical activity daily  Bring food record and glucose log to your next nutrition visit

## 2013-07-23 NOTE — Progress Notes (Signed)
Appt start time: 1500 end time:  1615.  Assessment:  Patient was seen on  07/23/2013 for individual diabetes education. Joe Green is here today to mostly learn about which foods to eat. 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 States that the shares the food preparation with his wife, who is currently on Weight Watchers.   Current HbA1c: 7.4% on 07/07/13  Preferred Learning Style:  No preference indicated   Learning Readiness:   Ready  MEDICATIONS: Metformin/Glimiperide   DIETARY INTAKE:  24-hr recall:  B ( AM): egg sandwich on brown bread with orange soda  Snk ( AM): none  L ( PM): sandwich, sub, or leftovers with Diet Coke Snk ( PM): none D (8PM): sandwich with unsweet tea Snk ( PM): none Beverages: Orange soda, diet, unsweet tea, 2 beers, Sprite sometimes  Usual physical activity: none at this time, active at work  Estimated energy needs: 2000 calories 225 g carbohydrates 150 g protein 56 g fat  Progress Towards Goal(s):  In progress.   Nutritional Diagnosis:  [place]-2.1 Inpaired nutrition utilization As related to glucose metabolism.  As evidenced by Hgb A1c of 7.4%.     Intervention:  Nutrition counseling provided.  Discussed diabetes disease process and treatment options.  Discussed physiology of diabetes and role of obesity on insulin resistance.  Encouraged moderate weight reduction to improve glucose levels.  Discussed role of medications and diet in glucose control  Provided education on macronutrients on glucose levels.  Provided education on carb counting, importance of regularly scheduled meals/snacks, and meal planning  Discussed effects of physical activity on glucose levels and long-term glucose control.  Recommended 150 minutes of physical activity/week.  Reviewed patient medications.  Discussed role of medication on blood glucose and possible side effects  Discussed blood glucose monitoring and interpretation.  Discussed  recommended target ranges and individual ranges.    Described short-term complications: hyper- and hypo-glycemia.  Discussed causes,symptoms, and treatment options.  Discussed prevention, detection, and treatment of long-term complications.  Discussed the role of prolonged elevated glucose levels on body systems.  Discussed role of stress on blood glucose levels and discussed strategies to manage psychosocial issues.  Discussed recommendations for long-term diabetes self-care.  Established checklist for medical, dental, and emotional self-care.  Teaching Method Utilized:  Visual Auditory Hands on  Handouts given during visit include:  Living Well With Diabetes  Carb Counting and Meal Planning  15 g CHO Snacks  MyPlate Handout  Blood Glucose Monitoring  Diabetes Self Care  Barriers to learning/adherence to lifestyle change: none  Diabetes self-care support plan:   [facility] support group  Wife  Demonstrated degree of understanding via:  Teach Back   Monitoring/Evaluation:  Dietary intake, exercise, and body weight prn.

## 2013-08-27 ENCOUNTER — Other Ambulatory Visit: Payer: Self-pay | Admitting: Internal Medicine

## 2013-10-03 ENCOUNTER — Encounter: Payer: Self-pay | Admitting: Physician Assistant

## 2013-10-03 ENCOUNTER — Ambulatory Visit (INDEPENDENT_AMBULATORY_CARE_PROVIDER_SITE_OTHER): Payer: BC Managed Care – PPO | Admitting: Physician Assistant

## 2013-10-03 ENCOUNTER — Ambulatory Visit (HOSPITAL_COMMUNITY)
Admission: RE | Admit: 2013-10-03 | Discharge: 2013-10-03 | Disposition: A | Discharge status: Home / Self Care | Payer: BC Managed Care – PPO | Source: Ambulatory Visit | Attending: Physician Assistant | Admitting: Physician Assistant

## 2013-10-03 VITALS — BP 118/62 | HR 84 | Temp 97.7°F | Resp 16 | Ht 69.0 in | Wt 206.0 lb

## 2013-10-03 DIAGNOSIS — F17200 Nicotine dependence, unspecified, uncomplicated: Secondary | ICD-10-CM

## 2013-10-03 DIAGNOSIS — I10 Essential (primary) hypertension: Secondary | ICD-10-CM

## 2013-10-03 DIAGNOSIS — N3000 Acute cystitis without hematuria: Secondary | ICD-10-CM

## 2013-10-03 DIAGNOSIS — Z0000 Encounter for general adult medical examination without abnormal findings: Secondary | ICD-10-CM

## 2013-10-03 DIAGNOSIS — I2510 Atherosclerotic heart disease of native coronary artery without angina pectoris: Secondary | ICD-10-CM | POA: Insufficient documentation

## 2013-10-03 DIAGNOSIS — Z79899 Other long term (current) drug therapy: Secondary | ICD-10-CM

## 2013-10-03 DIAGNOSIS — Z125 Encounter for screening for malignant neoplasm of prostate: Secondary | ICD-10-CM

## 2013-10-03 DIAGNOSIS — E119 Type 2 diabetes mellitus without complications: Secondary | ICD-10-CM

## 2013-10-03 DIAGNOSIS — E559 Vitamin D deficiency, unspecified: Secondary | ICD-10-CM

## 2013-10-03 LAB — CBC WITH DIFFERENTIAL/PLATELET
BASOS ABS: 0.1 10*3/uL (ref 0.0–0.1)
Basophils Relative: 1 % (ref 0–1)
EOS PCT: 5 % (ref 0–5)
Eosinophils Absolute: 0.4 10*3/uL (ref 0.0–0.7)
HCT: 44.1 % (ref 39.0–52.0)
Hemoglobin: 15.5 g/dL (ref 13.0–17.0)
LYMPHS PCT: 28 % (ref 12–46)
Lymphs Abs: 2.5 10*3/uL (ref 0.7–4.0)
MCH: 31.6 pg (ref 26.0–34.0)
MCHC: 35.1 g/dL (ref 30.0–36.0)
MCV: 90 fL (ref 78.0–100.0)
Monocytes Absolute: 0.7 10*3/uL (ref 0.1–1.0)
Monocytes Relative: 8 % (ref 3–12)
NEUTROS PCT: 58 % (ref 43–77)
Neutro Abs: 5.2 10*3/uL (ref 1.7–7.7)
PLATELETS: 170 10*3/uL (ref 150–400)
RBC: 4.9 MIL/uL (ref 4.22–5.81)
RDW: 13.3 % (ref 11.5–15.5)
WBC: 8.9 10*3/uL (ref 4.0–10.5)

## 2013-10-03 LAB — HEMOGLOBIN A1C
Hgb A1c MFr Bld: 7.3 % — ABNORMAL HIGH (ref <5.7)
MEAN PLASMA GLUCOSE: 163 mg/dL — AB (ref <117)

## 2013-10-03 MED ORDER — SAXAGLIPTIN HCL 5 MG PO TABS: 5.0000 mg | ORAL_TABLET | Freq: Every day | ORAL | Status: DC

## 2013-10-03 NOTE — Patient Instructions (Addendum)
Please call your insurance and check on shingles vaccine cost.     Bad carbs also include fruit juice, alcohol, and sweet tea. These are empty calories that do not signal to your brain that you are full.   Please remember the good carbs are still carbs which convert into sugar. So please measure them out no more than 1/2-1 cup of rice, oatmeal, pasta, and beans.  Veggies are however free foods! Pile them on.   I like lean protein at every meal such as chicken, [place], pork chops, cottage cheese, etc. Just do not fry these meats and please center your meal around vegetable, the meats should be a side dish.   No all fruit is created equal. Please see the list below, the fruit at the bottom is higher in sugars than the fruit at the top   Preventative Care for Adults, Male       REGULAR HEALTH EXAMS:  A routine yearly physical is a good way to check in with your primary care provider about your health and preventive screening. It is also an opportunity to share updates about your health and any concerns you have, and receive a thorough all-over exam.   Most health insurance companies pay for at least some preventative services.  Check with your health plan for specific coverages.  WHAT PREVENTATIVE SERVICES DO MEN NEED?  Adult men should have their weight and blood pressure checked regularly.   Men age 62 and older should have their cholesterol levels checked regularly.  Beginning at age 62 and continuing to age 62, men should be screened for colorectal cancer.  Certain people should may need continued testing until age 62.  Other cancer screening may include exams for testicular and prostate cancer.  Updating vaccinations is part of preventative care.  Vaccinations help protect against diseases such as the flu.  Lab tests are generally done as part of preventative care to screen for anemia and blood disorders, to screen for problems with the kidneys and liver, to screen for bladder  problems, to check blood sugar, and to check your cholesterol level.  Preventative services generally include counseling about diet, exercise, avoiding tobacco, drugs, excessive alcohol consumption, and sexually transmitted infections.    GENERAL RECOMMENDATIONS FOR GOOD HEALTH:  Healthy diet:  Eat a variety of foods, including fruit, vegetables, animal or vegetable protein, such as meat, fish, chicken, and eggs, or beans, lentils, tofu, and grains, such as rice.  Drink plenty of water daily.  Decrease saturated fat in the diet, avoid lots of red meat, processed foods, sweets, fast foods, and fried foods.  Exercise:  Aerobic exercise helps maintain good heart health. At least 30-40 minutes of moderate-intensity exercise is recommended. For example, a brisk walk that increases your heart rate and breathing. This should be done on most days of the week.   Find a type of exercise or a variety of exercises that you enjoy so that it becomes a part of your daily life.  Examples are running, walking, swimming, water aerobics, and biking.  For motivation and support, explore group exercise such as aerobic class, spin class, Zumba, Yoga,or  martial arts, etc.    Set exercise goals for yourself, such as a certain weight goal, walk or run in a race such as a 5k walk/run.  Speak to your primary care provider about exercise goals.  Disease prevention:  If you smoke or chew tobacco, find out from your caregiver how to quit. It can literally save your  life, no matter how long you have been a tobacco user. If you do not use tobacco, never begin.   Maintain a healthy diet and normal weight. Increased weight leads to problems with blood pressure and diabetes.   The Body Mass Index or BMI is a way of measuring how much of your body is fat. Having a BMI above 27 increases the risk of heart disease, diabetes, hypertension, stroke and other problems related to obesity. Your caregiver can help determine your  BMI and based on it develop an exercise and dietary program to help you achieve or maintain this important measurement at a healthful level.  High blood pressure causes heart and blood vessel problems.  Persistent high blood pressure should be treated with medicine if weight loss and exercise do not work.   Fat and cholesterol leaves deposits in your arteries that can block them. This causes heart disease and vessel disease elsewhere in your body.  If your cholesterol is found to be high, or if you have heart disease or certain other medical conditions, then you may need to have your cholesterol monitored frequently and be treated with medication.   Ask if you should have a stress test if your history suggests this. A stress test is a test done on a treadmill that looks for heart disease. This test can find disease prior to there being a problem.  Avoid drinking alcohol in excess (more than two drinks per day).  Avoid use of street drugs. Do not share needles with anyone. Ask for professional help if you need assistance or instructions on stopping the use of alcohol, cigarettes, and/or drugs.  Brush your teeth twice a day with fluoride toothpaste, and floss once a day. Good oral hygiene prevents tooth decay and gum disease. The problems can be painful, unattractive, and can cause other health problems. Visit your dentist for a routine oral and dental check up and preventive care every 6-12 months.   Look at your skin regularly.  Use a mirror to look at your back. Notify your caregivers of changes in moles, especially if there are changes in shapes, colors, a size larger than a pencil eraser, an irregular border, or development of new moles.  Safety:  Use seatbelts 100% of the time, whether driving or as a passenger.  Use safety devices such as hearing protection if you work in environments with loud noise or significant background noise.  Use safety glasses when doing any work that could send debris  in to the eyes.  Use a helmet if you ride a bike or motorcycle.  Use appropriate safety gear for contact sports.  Talk to your caregiver about gun safety.  Use sunscreen with a SPF (or skin protection factor) of 15 or greater.  Lighter skinned people are at a greater risk of skin cancer. Don't forget to also wear sunglasses in order to protect your eyes from too much damaging sunlight. Damaging sunlight can accelerate cataract formation.   Practice safe sex. Use condoms. Condoms are used for birth control and to help reduce the spread of sexually transmitted infections (or STIs).  Some of the STIs are gonorrhea (the clap), chlamydia, syphilis, trichomonas, herpes, HPV (human papilloma virus) and HIV (human immunodeficiency virus) which causes AIDS. The herpes, HIV and HPV are viral illnesses that have no cure. These can result in disability, cancer and death.   Keep carbon monoxide and smoke detectors in your home functioning at all times. Change the batteries every 6 months or  use a model that plugs into the wall.   Vaccinations:  Stay up to date with your tetanus shots and other required immunizations. You should have a booster for tetanus every 10 years. Be sure to get your flu shot every year, since 5%-20% of the [place] population comes down with the flu. The flu vaccine changes each year, so being vaccinated once is not enough. Get your shot in the fall, before the flu season peaks.   Other vaccines to consider:  Pneumococcal vaccine to protect against certain types of pneumonia.  This is normally recommended for adults age 52 or older.  However, adults younger than 62 years old with certain underlying conditions such as diabetes, heart or lung disease should also receive the vaccine.  Shingles vaccine to protect against Varicella Zoster if you are older than age 60, or younger than 62 years old with certain underlying illness.  Hepatitis A vaccine to protect against a form of infection of the  liver by a virus acquired from food.  Hepatitis B vaccine to protect against a form of infection of the liver by a virus acquired from blood or body fluids, particularly if you work in health care.  If you plan to travel internationally, check with your local health department for specific vaccination recommendations.  Cancer Screening:  Most routine colon cancer screening begins at the age of 66. On a yearly basis, doctors may provide special easy to use take-home tests to check for hidden blood in the stool. Sigmoidoscopy or colonoscopy can detect the earliest forms of colon cancer and is life saving. These tests use a small camera at the end of a tube to directly examine the colon. Speak to your caregiver about this at age 20, when routine screening begins (and is repeated every 5 years unless early forms of pre-cancerous polyps or small growths are found).   At the age of 46 men usually start screening for prostate cancer every year. Screening may begin at a younger age for those with higher risk. Those at higher risk include African-Americans or having a family history of prostate cancer. There are two types of tests for prostate cancer:   Prostate-specific antigen (PSA) testing. Recent studies raise questions about prostate cancer using PSA and you should discuss this with your caregiver.   Digital rectal exam (in which your doctor's lubricated and gloved finger feels for enlargement of the prostate through the anus).   Screening for testicular cancer.  Do a monthly exam of your testicles. Gently roll each testicle between your thumb and fingers, feeling for any abnormal lumps. The best time to do this is after a hot shower or bath when the tissues are looser. Notify your caregivers of any lumps, tenderness or changes in size or shape immediately.

## 2013-10-03 NOTE — Addendum Note (Signed)
Addended by: Vicie Mutters R on: 10/03/2013 01:48 PM   Modules accepted: Orders

## 2013-10-03 NOTE — Progress Notes (Signed)
Complete Physical HPI 63 y.o. male  presents for a complete physical. His blood pressure has been controlled at home, today their BP is BP: 118/62 mmHg He denies chest pain, shortness of breath, dizziness.  His cholesterol is diet controlled. In addition they are on crestor 3 days a week and denies myalgias. His cholesterol is controlled. The cholesterol last visit was:   Lab Results  Component Value Date   CHOL 164 07/08/2013   HDL 41 07/08/2013   LDLCALC 94 07/08/2013   TRIG 143 07/08/2013   CHOLHDL 4.0 07/08/2013   He has been working on diet and exercise for diabetes, and denies blurry vision, polydipsia, polyphagia and polyuria. Last A1C in the office was:  Lab Results  Component Value Date   HGBA1C 7.4* 07/08/2013   Patient is on Vitamin D supplement.    Current Medications:  Current Outpatient Prescriptions on File Prior to Visit  Medication Sig Dispense Refill  . aspirin 325 MG EC tablet Take 325 mg by mouth daily.        Marland Kitchen doxazosin (CARDURA) 8 MG tablet       . glimepiride (AMARYL) 4 MG tablet Take 1 tablet by mouth Daily.      . metFORMIN (GLUCOPHAGE) 850 MG tablet TAKE ONE TABLET BY MOUTH TWICE DAILY  60 tablet  3  . Plant Sterols and Stanols (CHOLEST OFF PO) Take by mouth.      . ramipril (ALTACE) 5 MG capsule Take 5 mg by mouth daily.        . rosuvastatin (CRESTOR) 20 MG tablet every Monday, Wednesday, and Friday. Once daily      . Vitamin D, Ergocalciferol, (DRISDOL) 50000 UNITS CAPS Take 5,000 Units by mouth.        Health Maintenance:  Tetanus: 2009 Pneumovax: 2009 Flu vaccine: 05/2013 Zostavax: will check with insurance DEXA: N/A Colonoscopy: 10/2011 due 5 years Dr. Fuller Plan EGD: N/A  Allergies: No Known Allergies Medical History:  Past Medical History  Diagnosis Date  . CAD (coronary artery disease)     a. s/p stent to RCA 2005; b. LHC 2006 after Nuc with inf ischemia: patent RCA stent, D1 80%, LAD 60% => med Rx;  c. LHC 04/18/11: Proximal LAD 60%  (unchanged from previous catheterization), distal LAD 65% (small caliber), Dx 60%, proximal CFX 20%, mid CFX 50%, OM1 30%, proximal RCA 20%, mid RCA stent patent, EF 65%.=> med Rx  . HLD (hyperlipidemia)   . Hypertension   . OSA (obstructive sleep apnea)   . Type II or unspecified type diabetes mellitus without mention of complication, not stated as uncontrolled    Surgical History:  Past Surgical History  Procedure Laterality Date  . Heart stent  2005  . Colonoscopy    . Polypectomy     Family History:  Family History  Problem Relation Age of Onset  . Prostate cancer Father   . Stroke Father   . Hypertension Father    Social History:  History  Substance Use Topics  . Smoking status: Current Every Day Smoker -- 0.50 packs/day for 40 years    Types: Cigarettes  . Smokeless tobacco: Not on file  . Alcohol Use: Yes    Review of Systems: [X]  = yes, [ ]  = no   General: Fatigue [ ] ; Fever [ ] ; Chills [ ] ; Weakness [ ]   Insomnia [ ]  Eyes: ([facility]) DEE 12/2012 Redness [ ]  Blurred vision [ ]  Diplopia [ ]   ENT: Congestion [ ]  Sinus  Pain [ ]  Post Nasal Drip [ ]  Sore Throat [ ]  Earache [ ]   Cardiac: ( Dr. Oneida Arenas) Carotid U/S neg 2012 Chest pain/pressure [ ] ; SOB [ ] ; Orthopnea [ ] ;  Edema [ ] ; Palpitations [ ] ;  Paroxysmal nocturnal dyspnea[ ]  Claudication [ ]   Pulmonary: Cough [ ] ; Wheezing[ ] ; SOB [ ]   Snoring [ ]   GI: ( Dr. Fuller Plan) Nausea [ ]  Vomiting[ ] ; Dysphagia[ ] ; Heartburn[ ] ; Abdominal pain [ ] ; Constipation [ ] ; Diarrhea [ ] ; BRBPR [ ]  Melena[ ]  GU: Hematuria[ ] ; Dysuria Valu.Nieves ]; Nocturia[ ]  Urgency [ ]   Hesitancy [ ]  Discharge [ ]  Neuro: Headaches[ ] ; Vertigo[ ] ; Seizures[ ] ; Paresthesias[ ] ;Blurred vision [ ] ; Diplopia [ ] ; Vision changes [ ]   Ortho: Arthritis [ ] ; Joint pain [ ] ; Muscle pain [ ] ; Joint swelling [ ] ; Back Pain [ ] ; Skin:  Rash [ ]   Pruritis [ ]  Change in skin lesion [ ]   Psych: Depression[ ] ; Anxiety[ ]  confusion [ ]  Memory loss [ ]   Heme/Lypmh:  Bleeding [ ] ; Bruising [ ] ; Enlarged lymph nodes [ ]   Endocrine: visual blurring [ ]  paresthesia [ ]  polyuria [ ]  polydypsea [ ]    Physical Exam: Estimated body mass index is 30.41 kg/(m^2) as calculated from the following:   Height as of this encounter: 5\' 9"  (1.753 m).   Weight as of this encounter: 206 lb (93.441 kg). Filed Vitals:   10/03/13 0924  BP: 118/62  Pulse: 84  Temp: 97.7 F (36.5 C)  Resp: 16   General Appearance: Well nourished, in no apparent distress. Eyes: PERRLA, EOMs, conjunctiva no swelling or erythema, normal fundi and vessels. Sinuses: No Frontal/maxillary tenderness ENT/Mouth: Ext aud canals clear, normal light reflex with TMs without erythema, bulging. Good dentition. No erythema, swelling, or exudate on post pharynx. Tonsils not swollen or erythematous. Hearing normal.  Neck: Supple, thyroid normal. No bruits Respiratory: Respiratory effort normal, BS equal bilaterally without rales, rhonchi, wheezing or stridor. Cardio: RRR without murmurs, rubs or gallops. Brisk peripheral pulses without edema.  Chest: symmetric, with normal excursions and percussion. Abdomen: Soft, +BS. Non tender, no guarding, rebound, hernias, masses, or organomegaly. .  Lymphatics: Non tender without lymphadenopathy.  Genitourinary: defer Musculoskeletal: Full ROM all peripheral extremities,5/5 strength, and normal gait. Skin: Warm, dry without rashes, lesions, ecchymosis. Foot exam normal.  Neuro: Cranial nerves intact, reflexes equal bilaterally. Normal muscle tone, no cerebellar symptoms. Sensation intact.  Psych: Awake and oriented X 3, normal affect, Insight and Judgment appropriate.   EKG: WNL no changes, CRBBB  Assessment and Plan: CAD (coronary artery disease)- no chest pain, continue to follow with Cardio  HLD (hyperlipidemia)- check levels, goal less than 70  Hypertension- at goal, continue medications, DASH diet  OSA (obstructive sleep apnea)- Sees Dr. Toy Cookey, has dental  appliance  Type II Diabetes- Discussed general issues about diabetes pathophysiology and management., Educational material distributed., Suggested low cholesterol diet., Encouraged aerobic exercise., Discussed foot care., Reminded to get yearly retinal exam. Stop the glipizide due to the weight gain and side effects, try the onglyza, given card  COPD- suggest PFTs, patient declines BPH- continue medicaiton, check PSA Smoking cessation- try to do the vaps, does want to quit.  Will check on zostavax coverage with insurance. Dysuria-? UTI- check culture, PSA. cipro  Discussed med's effects and SE's. Screening labs and tests as requested with regular follow-up as recommended.  Vicie Mutters 9:45 AM

## 2013-10-04 LAB — URINALYSIS, ROUTINE W REFLEX MICROSCOPIC
BILIRUBIN URINE: NEGATIVE
Glucose, UA: NEGATIVE mg/dL
Hgb urine dipstick: NEGATIVE
Ketones, ur: NEGATIVE mg/dL
Leukocytes, UA: NEGATIVE
Nitrite: NEGATIVE
Protein, ur: NEGATIVE mg/dL
SPECIFIC GRAVITY, URINE: 1.018 (ref 1.005–1.030)
Urobilinogen, UA: 1 mg/dL (ref 0.0–1.0)
pH: 6.5 (ref 5.0–8.0)

## 2013-10-04 LAB — HEPATIC FUNCTION PANEL
ALT: 32 U/L (ref 0–53)
AST: 19 U/L (ref 0–37)
Albumin: 4.7 g/dL (ref 3.5–5.2)
Alkaline Phosphatase: 88 U/L (ref 39–117)
BILIRUBIN INDIRECT: 0.5 mg/dL (ref 0.2–1.2)
Bilirubin, Direct: 0.1 mg/dL (ref 0.0–0.3)
TOTAL PROTEIN: 7 g/dL (ref 6.0–8.3)
Total Bilirubin: 0.6 mg/dL (ref 0.2–1.2)

## 2013-10-04 LAB — INSULIN, FASTING: Insulin fasting, serum: 9 u[IU]/mL (ref 3–28)

## 2013-10-04 LAB — VITAMIN B12: Vitamin B-12: 529 pg/mL (ref 211–911)

## 2013-10-04 LAB — TSH: TSH: 1.442 u[IU]/mL (ref 0.350–4.500)

## 2013-10-04 LAB — BASIC METABOLIC PANEL WITH GFR
BUN: 13 mg/dL (ref 6–23)
CHLORIDE: 99 meq/L (ref 96–112)
CO2: 25 mEq/L (ref 19–32)
Calcium: 9.2 mg/dL (ref 8.4–10.5)
Creat: 0.76 mg/dL (ref 0.50–1.35)
GFR, Est Non African American: >89 mL/min
Glucose, Bld: 127 mg/dL — ABNORMAL HIGH (ref 70–99)
Potassium: 4.2 mEq/L (ref 3.5–5.3)
SODIUM: 135 meq/L (ref 135–145)

## 2013-10-04 LAB — VITAMIN D 25 HYDROXY (VIT D DEFICIENCY, FRACTURES): Vit D, 25-Hydroxy: 62 ng/mL (ref 30–89)

## 2013-10-04 LAB — LIPID PANEL
Cholesterol: 136 mg/dL (ref 0–200)
HDL: 43 mg/dL (ref >39)
LDL CALC: 68 mg/dL (ref 0–99)
Total CHOL/HDL Ratio: 3.2 Ratio
Triglycerides: 126 mg/dL (ref <150)
VLDL: 25 mg/dL (ref 0–40)

## 2013-10-04 LAB — IRON AND TIBC
%SAT: 36 % (ref 20–55)
Iron: 128 ug/dL (ref 42–165)
TIBC: 356 ug/dL (ref 215–435)
UIBC: 228 ug/dL (ref 125–400)

## 2013-10-04 LAB — MAGNESIUM: Magnesium: 2 mg/dL (ref 1.5–2.5)

## 2013-10-04 LAB — TESTOSTERONE: Testosterone: 331 ng/dL (ref 300–890)

## 2013-10-04 LAB — MICROALBUMIN / CREATININE URINE RATIO
CREATININE, URINE: 164.2 mg/dL
Microalb Creat Ratio: 8.8 mg/g (ref 0.0–30.0)
Microalb, Ur: 1.45 mg/dL (ref 0.00–1.89)

## 2013-10-04 LAB — PSA: PSA: 4.09 ng/mL — ABNORMAL HIGH (ref <=4.00)

## 2013-10-04 LAB — FERRITIN: FERRITIN: 90 ng/mL (ref 22–322)

## 2013-10-04 LAB — URINE CULTURE
COLONY COUNT: NO GROWTH
ORGANISM ID, BACTERIA: NO GROWTH

## 2013-10-20 ENCOUNTER — Ambulatory Visit (INDEPENDENT_AMBULATORY_CARE_PROVIDER_SITE_OTHER): Payer: BC Managed Care – PPO | Admitting: *Deleted

## 2013-10-20 ENCOUNTER — Encounter: Payer: Self-pay | Admitting: Cardiovascular Disease

## 2013-10-20 ENCOUNTER — Ambulatory Visit (INDEPENDENT_AMBULATORY_CARE_PROVIDER_SITE_OTHER): Payer: BC Managed Care – PPO | Admitting: Cardiovascular Disease

## 2013-10-20 VITALS — BP 110/68 | HR 66 | Ht 71.0 in | Wt 212.0 lb

## 2013-10-20 DIAGNOSIS — E119 Type 2 diabetes mellitus without complications: Secondary | ICD-10-CM

## 2013-10-20 DIAGNOSIS — I2510 Atherosclerotic heart disease of native coronary artery without angina pectoris: Secondary | ICD-10-CM

## 2013-10-20 DIAGNOSIS — E782 Mixed hyperlipidemia: Secondary | ICD-10-CM

## 2013-10-20 DIAGNOSIS — I10 Essential (primary) hypertension: Secondary | ICD-10-CM

## 2013-10-20 DIAGNOSIS — Z23 Encounter for immunization: Secondary | ICD-10-CM

## 2013-10-20 MED ORDER — ZOSTER VACCINE LIVE 19400 UNT/0.65ML ~~LOC~~ SOLR
0.6500 mL | Freq: Once | SUBCUTANEOUS | Status: AC
  Administered 2013-10-20: 19400 [IU] via SUBCUTANEOUS

## 2013-10-20 NOTE — Assessment & Plan Note (Signed)
Well controlled.  Continue current medications and low sodium Dash type diet.    

## 2013-10-20 NOTE — Progress Notes (Signed)
Patient ID: Joe Green, male   DOB: Mar 21, 1951, 63 y.o.   MRN: [id number] Patient presents today for Zostavax administration.  Back of left, upper arm cleaned with alcohol and Zostavax administered SQ.  Patient tolerated well.  Advised patient of possible side effects to watch for and monitor area for the next few days.  Advised patient to call with any questions or concerns.

## 2013-10-20 NOTE — Assessment & Plan Note (Signed)
Stable with no angina and good activity level.  Continue medical Rx  

## 2013-10-20 NOTE — Assessment & Plan Note (Signed)
Discussed low carb diet.  Target hemoglobin A1c is 6.5 or less.  Continue current medications.  

## 2013-10-20 NOTE — Assessment & Plan Note (Signed)
Cholesterol is at goal.  Continue current dose of statin and diet Rx.  No myalgias or side effects.  F/U  LFT's in 6 months. Lab Results  Component Value Date   LDLCALC 68 10/03/2013

## 2013-10-20 NOTE — Patient Instructions (Signed)
Your physician wants you to follow-up in:  6 MONTHS WITH DR NISHAN  You will receive a reminder letter in the mail two months in advance. If you don't receive a letter, please call our office to schedule the follow-up appointment. Your physician recommends that you continue on your current medications as directed. Please refer to the Current Medication list given to you today. 

## 2013-10-20 NOTE — Progress Notes (Signed)
Patient ID: [name], male   DOB: [birth year], 63 y.o.   MRN: [id number] Joe Green is a 63 y.o. male who returns for evaluation of chest pain. He has a hx of CAD, s/p stent to RCA in 2005, DM2, HTN, HL. Last cath in 2006 after myoview that demonstrated inf ischemia was notable for a patent RCA stent but D1 80%, mLAD 60%. Patient last seen by me 03/2011. He was having increasing pain suggestive of angina. Cardiac catheterization was arranged. LHC 04/18/11: Proximal LAD 60% (unchanged from previous catheterization), distal LAD 65% (small caliber), Dx 60%, proximal CFX 20%, mid CFX 50%, OM1 30%, proximal RCA 20%, mid RCA stent patent, EF 65%. Films were reviewed from 2006 and there were no significant changes. PCI was not indicated. Medical therapy was continued at that time. Patient had done well after his LHC in 2012  Recent LDL 94 And A1c 7.4    Taking statin recent labs by [place] ok    ROS: Denies fever, malais, weight loss, blurry vision, decreased visual acuity, cough, sputum, SOB, hemoptysis, pleuritic pain, palpitaitons, heartburn, abdominal pain, melena, lower extremity edema, claudication, or rash.  All other systems reviewed and negative  General: Affect appropriate Healthy:  appears stated age 63: normal Neck supple with no adenopathy JVP normal no bruits no thyromegaly Lungs clear with no wheezing and good diaphragmatic motion Heart:  S1/S2 no murmur, no rub, gallop or click PMI normal Abdomen: benighn, BS positve, no tenderness, no AAA no bruit.  No HSM or HJR Distal pulses intact with no bruits No edema Neuro non-focal Skin warm and dry No muscular weakness   Current Outpatient Prescriptions  Medication Sig Dispense Refill  . aspirin 325 MG EC tablet Take 325 mg by mouth daily.        . Cholecalciferol (VITAMIN D PO) Take 5,000 mg by mouth daily.      Marland Kitchen doxazosin (CARDURA) 8 MG tablet       . glimepiride (AMARYL) 4 MG tablet Take 1 tablet by mouth Daily.       . metFORMIN (GLUCOPHAGE) 850 MG tablet TAKE ONE TABLET BY MOUTH TWICE DAILY  60 tablet  3  . Plant Sterols and Stanols (CHOLEST OFF PO) Take by mouth.      . ramipril (ALTACE) 5 MG capsule Take 5 mg by mouth daily.        . rosuvastatin (CRESTOR) 20 MG tablet Take 10 mg by mouth. Three Times a Week      . saxagliptin HCl (ONGLYZA) 5 MG TABS tablet Take 1 tablet (5 mg total) by mouth daily.  30 tablet  5   Current Facility-Administered Medications  Medication Dose Route Frequency Provider Last Rate Last Dose  . 0.9 %  sodium chloride infusion  500 mL Intravenous Continuous Ladene Artist, MD        Allergies  Review of patient's allergies indicates no known allergies.  Electrocardiogram:  SR RBBB   Assessment and Plan

## 2013-10-23 ENCOUNTER — Ambulatory Visit: Payer: BC Managed Care – PPO | Admitting: Internal Medicine

## 2013-12-29 ENCOUNTER — Other Ambulatory Visit: Payer: Self-pay | Admitting: Internal Medicine

## 2014-01-02 ENCOUNTER — Ambulatory Visit (INDEPENDENT_AMBULATORY_CARE_PROVIDER_SITE_OTHER): Payer: BC Managed Care – PPO | Admitting: Physician Assistant

## 2014-01-02 ENCOUNTER — Encounter: Payer: Self-pay | Admitting: Physician Assistant

## 2014-01-02 VITALS — BP 122/64 | HR 76 | Temp 97.7°F | Resp 16 | Wt 210.0 lb

## 2014-01-02 DIAGNOSIS — Z79899 Other long term (current) drug therapy: Secondary | ICD-10-CM

## 2014-01-02 DIAGNOSIS — I2510 Atherosclerotic heart disease of native coronary artery without angina pectoris: Secondary | ICD-10-CM

## 2014-01-02 DIAGNOSIS — R972 Elevated prostate specific antigen [PSA]: Secondary | ICD-10-CM

## 2014-01-02 DIAGNOSIS — G4733 Obstructive sleep apnea (adult) (pediatric): Secondary | ICD-10-CM

## 2014-01-02 DIAGNOSIS — E119 Type 2 diabetes mellitus without complications: Secondary | ICD-10-CM

## 2014-01-02 DIAGNOSIS — E782 Mixed hyperlipidemia: Secondary | ICD-10-CM

## 2014-01-02 DIAGNOSIS — E559 Vitamin D deficiency, unspecified: Secondary | ICD-10-CM

## 2014-01-02 DIAGNOSIS — I10 Essential (primary) hypertension: Secondary | ICD-10-CM

## 2014-01-02 DIAGNOSIS — F17200 Nicotine dependence, unspecified, uncomplicated: Secondary | ICD-10-CM

## 2014-01-02 LAB — CBC WITH DIFFERENTIAL/PLATELET
BASOS ABS: 0 10*3/uL (ref 0.0–0.1)
BASOS PCT: 0 % (ref 0–1)
EOS ABS: 0.3 10*3/uL (ref 0.0–0.7)
Eosinophils Relative: 4 % (ref 0–5)
HCT: 45.8 % (ref 39.0–52.0)
Hemoglobin: 15.7 g/dL (ref 13.0–17.0)
LYMPHS ABS: 2.1 10*3/uL (ref 0.7–4.0)
Lymphocytes Relative: 24 % (ref 12–46)
MCH: 31.4 pg (ref 26.0–34.0)
MCHC: 34.3 g/dL (ref 30.0–36.0)
MCV: 91.6 fL (ref 78.0–100.0)
Monocytes Absolute: 0.6 10*3/uL (ref 0.1–1.0)
Monocytes Relative: 7 % (ref 3–12)
NEUTROS PCT: 65 % (ref 43–77)
Neutro Abs: 5.6 10*3/uL (ref 1.7–7.7)
Platelets: 158 10*3/uL (ref 150–400)
RBC: 5 MIL/uL (ref 4.22–5.81)
RDW: 12.9 % (ref 11.5–15.5)
WBC: 8.6 10*3/uL (ref 4.0–10.5)

## 2014-01-02 LAB — HEPATIC FUNCTION PANEL
ALBUMIN: 4.4 g/dL (ref 3.5–5.2)
ALT: 30 U/L (ref 0–53)
AST: 19 U/L (ref 0–37)
Alkaline Phosphatase: 79 U/L (ref 39–117)
BILIRUBIN TOTAL: 0.6 mg/dL (ref 0.2–1.2)
Bilirubin, Direct: 0.1 mg/dL (ref 0.0–0.3)
Indirect Bilirubin: 0.5 mg/dL (ref 0.2–1.2)
Total Protein: 6.5 g/dL (ref 6.0–8.3)

## 2014-01-02 LAB — BASIC METABOLIC PANEL WITH GFR
BUN: 9 mg/dL (ref 6–23)
CO2: 25 meq/L (ref 19–32)
CREATININE: 0.79 mg/dL (ref 0.50–1.35)
Calcium: 9.2 mg/dL (ref 8.4–10.5)
Chloride: 104 mEq/L (ref 96–112)
Glucose, Bld: 169 mg/dL — ABNORMAL HIGH (ref 70–99)
Potassium: 4.2 mEq/L (ref 3.5–5.3)
SODIUM: 138 meq/L (ref 135–145)

## 2014-01-02 LAB — LIPID PANEL
Cholesterol: 122 mg/dL (ref 0–200)
HDL: 42 mg/dL (ref >39)
LDL Cholesterol: 59 mg/dL (ref 0–99)
TRIGLYCERIDES: 104 mg/dL (ref <150)
Total CHOL/HDL Ratio: 2.9 Ratio
VLDL: 21 mg/dL (ref 0–40)

## 2014-01-02 LAB — MAGNESIUM: Magnesium: 1.9 mg/dL (ref 1.5–2.5)

## 2014-01-02 LAB — HEMOGLOBIN A1C
Hgb A1c MFr Bld: 7.3 % — ABNORMAL HIGH (ref <5.7)
MEAN PLASMA GLUCOSE: 163 mg/dL — AB (ref <117)

## 2014-01-02 LAB — TSH: TSH: 1.571 u[IU]/mL (ref 0.350–4.500)

## 2014-01-02 MED ORDER — SAXAGLIPTIN HCL 5 MG PO TABS: 5.0000 mg | ORAL_TABLET | Freq: Every day | ORAL | Status: DC

## 2014-01-02 NOTE — Patient Instructions (Signed)
 Bad carbs also include fruit juice, alcohol, and sweet tea. These are empty calories that do not signal to your brain that you are full.   Please remember the good carbs are still carbs which convert into sugar. So please measure them out no more than 1/2-1 cup of rice, oatmeal, pasta, and beans.  Veggies are however free foods! Pile them on.   I like lean protein at every meal such as chicken, turkey, pork chops, cottage cheese, etc. Just do not fry these meats and please center your meal around vegetable, the meats should be a side dish.   No all fruit is created equal. Please see the list below, the fruit at the bottom is higher in sugars than the fruit at the top  Smoking Cessation Quitting smoking is important to your health and has many advantages. However, it is not always easy to quit since nicotine is a very addictive drug. Often times, people try 3 times or more before being able to quit. This document explains the best ways for you to prepare to quit smoking. Quitting takes hard work and a lot of effort, but you can do it. ADVANTAGES OF QUITTING SMOKING  You will live longer, feel better, and live better.  Your body will feel the impact of quitting smoking almost immediately.  Within 20 minutes, blood pressure decreases. Your pulse returns to its normal level.  After 8 hours, carbon monoxide levels in the blood return to normal. Your oxygen level increases.  After 24 hours, the chance of having a heart attack starts to decrease. Your breath, hair, and body stop smelling like smoke.  After 48 hours, damaged nerve endings begin to recover. Your sense of taste and smell improve.  After 72 hours, the body is virtually free of nicotine. Your bronchial tubes relax and breathing becomes easier.  After 2 to 12 weeks, lungs can hold more air. Exercise becomes easier and circulation improves.  The risk of having a heart attack, stroke, cancer, or lung disease is greatly  reduced.  After 1 year, the risk of coronary heart disease is cut in half.  After 5 years, the risk of stroke falls to the same as a nonsmoker.  After 10 years, the risk of lung cancer is cut in half and the risk of other cancers decreases significantly.  After 15 years, the risk of coronary heart disease drops, usually to the level of a nonsmoker.  If you are pregnant, quitting smoking will improve your chances of having a healthy baby.  The people you live with, especially any children, will be healthier.  You will have extra money to spend on things other than cigarettes. QUESTIONS TO THINK ABOUT BEFORE ATTEMPTING TO QUIT You may want to talk about your answers with your caregiver.  Why do you want to quit?  If you tried to quit in the past, what helped and what did not?  What will be the most difficult situations for you after you quit? How will you plan to handle them?  Who can help you through the tough times? Your family? Friends? A caregiver?  What pleasures do you get from smoking? What ways can you still get pleasure if you quit? Here are some questions to ask your caregiver:  How can you help me to be successful at quitting?  What medicine do you think would be best for me and how should I take it?  What should I do if I need more help?  What   is smoking withdrawal like? How can I get information on withdrawal? GET READY  Set a quit date.  Change your environment by getting rid of all cigarettes, ashtrays, matches, and lighters in your home, car, or work. Do not let people smoke in your home.  Review your past attempts to quit. Think about what worked and what did not. GET SUPPORT AND ENCOURAGEMENT You have a better chance of being successful if you have help. You can get support in many ways.  Tell your family, friends, and co-workers that you are going to quit and need their support. Ask them not to smoke around you.  Get individual, group, or telephone  counseling and support. Programs are available at local hospitals and health centers. Call your local health department for information about programs in your area.  Spiritual beliefs and practices may help some smokers quit.  Download a "quit meter" on your computer to keep track of quit statistics, such as how long you have gone without smoking, cigarettes not smoked, and money saved.  Get a self-help book about quitting smoking and staying off of tobacco. LEARN NEW SKILLS AND BEHAVIORS  Distract yourself from urges to smoke. Talk to someone, go for a walk, or occupy your time with a task.  Change your normal routine. Take a different route to work. Drink tea instead of coffee. Eat breakfast in a different place.  Reduce your stress. Take a hot bath, exercise, or read a book.  Plan something enjoyable to do every day. Reward yourself for not smoking.  Explore interactive web-based programs that specialize in helping you quit. GET MEDICINE AND USE IT CORRECTLY Medicines can help you stop smoking and decrease the urge to smoke. Combining medicine with the above behavioral methods and support can greatly increase your chances of successfully quitting smoking.  Nicotine replacement therapy helps deliver nicotine to your body without the negative effects and risks of smoking. Nicotine replacement therapy includes nicotine gum, lozenges, inhalers, nasal sprays, and skin patches. Some may be available over-the-counter and others require a prescription.  Antidepressant medicine helps people abstain from smoking, but how this works is unknown. This medicine is available by prescription.  Nicotinic receptor partial agonist medicine simulates the effect of nicotine in your brain. This medicine is available by prescription. Ask your caregiver for advice about which medicines to use and how to use them based on your health history. Your caregiver will tell you what side effects to look out for if you  choose to be on a medicine or therapy. Carefully read the information on the package. Do not use any other product containing nicotine while using a nicotine replacement product.  RELAPSE OR DIFFICULT SITUATIONS Most relapses occur within the first 3 months after quitting. Do not be discouraged if you start smoking again. Remember, most people try several times before finally quitting. You may have symptoms of withdrawal because your body is used to nicotine. You may crave cigarettes, be irritable, feel very hungry, cough often, get headaches, or have difficulty concentrating. The withdrawal symptoms are only temporary. They are strongest when you first quit, but they will go away within 10 14 days. To reduce the chances of relapse, try to:  Avoid drinking alcohol. Drinking lowers your chances of successfully quitting.  Reduce the amount of caffeine you consume. Once you quit smoking, the amount of caffeine in your body increases and can give you symptoms, such as a rapid heartbeat, sweating, and anxiety.  Avoid smokers because they can   make you want to smoke.  Do not let weight gain distract you. Many smokers will gain weight when they quit, usually less than 10 pounds. Eat a healthy diet and stay active. You can always lose the weight gained after you quit.  Find ways to improve your mood other than smoking. FOR MORE INFORMATION  www.smokefree.gov  Document Released: 07/25/2001 Document Revised: 01/30/2012 Document Reviewed: 11/09/2011 [employer] Patient Information 2014 [employer].  

## 2014-01-02 NOTE — Progress Notes (Signed)
Assessment and Plan:  Hypertension: Continue medication, monitor blood pressure at home. Continue DASH diet. Cholesterol: Continue diet and exercise. Check cholesterol.  Diabetes-Continue diet and exercise. Check A1C, refill onglyza CAD- discussed smoking cessation, using vape, discussed need for better control of sugar, HTN, and chol Vitamin D Def- check level and continue medications.  Smoking cessation discussed Elevated PSA- recheck  Continue diet and meds as discussed. Further disposition pending results of labs. Discussed med's effects and SE's.    HPI 63 y.o. male  presents for 3 month follow up with hypertension, hyperlipidemia, diabetes and vitamin D. His blood pressure has been controlled at home, today their BP is BP: 122/64 mmHg He does not workout but he does yard work and stays busy. He denies chest pain, shortness of breath, dizziness.  He is on cholesterol medication and denies myalgias. His cholesterol is at goal. The cholesterol last visit was:   Lab Results  Component Value Date   CHOL 136 10/03/2013   HDL 43 10/03/2013   LDLCALC 68 10/03/2013   TRIG 126 10/03/2013   CHOLHDL 3.2 10/03/2013   He has been working on diet and exercise for Diabetes, he has freestyle machine and checks sugars occ, in AM runs in the 140's, he was suppose to be on onglyza but he has not been able to activate the card and wants to get back on it and denies paresthesia of the feet, polydipsia and polyuria. Last A1C in the office was:  Lab Results  Component Value Date   HGBA1C 7.3* 10/03/2013   Patient is on Vitamin D supplement.   Last PSA values was elevated with normal urine. Will recheck today.  Lab Results  Component Value Date   PSA 4.09* 10/03/2013    Current Medications:  Current Outpatient Prescriptions on File Prior to Visit  Medication Sig Dispense Refill  . aspirin 325 MG EC tablet Take 325 mg by mouth daily.        . Cholecalciferol (VITAMIN D PO) Take 5,000 mg by mouth daily.       Marland Kitchen doxazosin (CARDURA) 8 MG tablet TAKE ONE TABLET BY MOUTH EVERY DAY  90 tablet  1  . glimepiride (AMARYL) 4 MG tablet Take 1 tablet by mouth Daily.      . metFORMIN (GLUCOPHAGE) 850 MG tablet TAKE ONE TABLET BY MOUTH TWICE DAILY  60 tablet  3  . Plant Sterols and Stanols (CHOLEST OFF PO) Take by mouth.      . ramipril (ALTACE) 5 MG capsule Take 5 mg by mouth daily.        . rosuvastatin (CRESTOR) 20 MG tablet Take 10 mg by mouth. Three Times a Week      . saxagliptin HCl (ONGLYZA) 5 MG TABS tablet Take 1 tablet (5 mg total) by mouth daily.  30 tablet  5   Current Facility-Administered Medications on File Prior to Visit  Medication Dose Route Frequency Provider Last Rate Last Dose  . 0.9 %  sodium chloride infusion  500 mL Intravenous Continuous Ladene Artist, MD       Medical History:  Past Medical History  Diagnosis Date  . CAD (coronary artery disease)     a. s/p stent to RCA 2005; b. LHC 2006 after Nuc with inf ischemia: patent RCA stent, D1 80%, LAD 60% => med Rx;  c. LHC 04/18/11: Proximal LAD 60% (unchanged from previous catheterization), distal LAD 65% (small caliber), Dx 60%, proximal CFX 20%, mid CFX 50%, OM1 30%, proximal RCA  20%, mid RCA stent patent, EF 65%.=> med Rx  . HLD (hyperlipidemia)   . Hypertension   . OSA (obstructive sleep apnea)   . Type II or unspecified type diabetes mellitus without mention of complication, not stated as uncontrolled   . Obesity   . GERD (gastroesophageal reflux disease)   . Vitamin D deficiency    Allergies: No Known Allergies   Review of Systems: [X]  = complains of  [ ]  = denies  General: Fatigue [ ]  Fever [ ]  Chills [ ]  Weakness [ ]   Insomnia [ ]  Eyes: Redness [ ]  Blurred vision [ ]  Diplopia [ ]   ENT: Congestion [ ]  Sinus Pain [ ]  Post Nasal Drip [ ]  Sore Throat [ ]  Earache [ ]   Cardiac: Chest pain/pressure [ ]  SOB [ ]  Orthopnea [ ]   Palpitations [ ]   Paroxysmal nocturnal dyspnea[ ]  Claudication [ ]  Edema [ ]   Pulmonary: Cough [ ]   Wheezing[ ]   SOB [ ]   Snoring [ ]   GI: Nausea [ ]  Vomiting[ ]  Dysphagia[ ]  Heartburn[ ]  Abdominal pain [ ]  Constipation [ ] ; Diarrhea [ ] ; BRBPR [ ]  Melena[ ]  GU: Hematuria[ ]  Dysuria [ ]  Nocturia[ ]  Urgency [ ]   Hesitancy [ ]  Discharge [ ]  Neuro: Headaches[ ]  Vertigo[ ]  Paresthesias[ ]  Spasm [ ]  Speech changes [ ]  Incoordination [ ]   Ortho: Arthritis [ ]  Joint pain [ ]  Muscle pain [ ]  Joint swelling [ ]  Back Pain [ ]  Skin:  Rash [ ]   Pruritis [ ]  Change in skin lesion [ ]   Psych: Depression[ ]  Anxiety[ ]  Confusion [ ]  Memory loss [ ]   Heme/Lypmh: Bleeding [ ]  Bruising [ ]  Enlarged lymph nodes [ ]   Endocrine: Visual blurring [ ]  Paresthesia [ ]  Polyuria [ ]  Polydypsea [ ]    Heat/cold intolerance [ ]  Hypoglycemia [ ]   Family history- Review and unchanged Social history- Review and unchanged Physical Exam: BP 122/64  Pulse 76  Temp(Src) 97.7 F (36.5 C)  Resp 16  Wt 210 lb (95.255 kg) Wt Readings from Last 3 Encounters:  01/02/14 210 lb (95.255 kg)  10/20/13 212 lb (96.163 kg)  10/03/13 206 lb (93.441 kg)   General Appearance: Well nourished, in no apparent distress. Eyes: PERRLA, EOMs, conjunctiva no swelling or erythema Sinuses: No Frontal/maxillary tenderness ENT/Mouth: Ext aud canals clear, TMs without erythema, bulging. No erythema, swelling, or exudate on post pharynx.  Tonsils not swollen or erythematous. Hearing normal.  Neck: Supple, thyroid normal.  Respiratory: Respiratory effort normal, BS equal bilaterally without rales, rhonchi, wheezing or stridor.  Cardio: RRR with no MRGs. Brisk peripheral pulses without edema.  Abdomen: Soft, + BS.  Non tender, no guarding, rebound, hernias, masses. Lymphatics: Non tender without lymphadenopathy.  Musculoskeletal: Full ROM, 5/5 strength, normal gait.  Skin: Warm, dry without rashes, lesions, ecchymosis.  Neuro: Cranial nerves intact. No cerebellar symptoms. Sensation intact.  Psych: Awake and oriented X 3, normal affect, Insight  and Judgment appropriate.    Vicie Mutters 9:23 AM

## 2014-01-03 LAB — VITAMIN D 25 HYDROXY (VIT D DEFICIENCY, FRACTURES): Vit D, 25-Hydroxy: 52 ng/mL (ref 30–89)

## 2014-01-03 LAB — INSULIN, FASTING: Insulin fasting, serum: 11 u[IU]/mL (ref 3–28)

## 2014-01-03 LAB — PSA: PSA: 3.84 ng/mL (ref <=4.00)

## 2014-01-06 ENCOUNTER — Telehealth: Payer: Self-pay

## 2014-01-06 NOTE — Telephone Encounter (Signed)
Pt called requesting lab results. Pt aware of lab results and instructions.

## 2014-02-01 ENCOUNTER — Other Ambulatory Visit: Payer: Self-pay | Admitting: Physician Assistant

## 2014-02-27 ENCOUNTER — Other Ambulatory Visit: Payer: Self-pay | Admitting: Emergency Medicine

## 2014-04-01 ENCOUNTER — Encounter: Payer: Self-pay | Admitting: Internal Medicine

## 2014-04-01 ENCOUNTER — Ambulatory Visit (INDEPENDENT_AMBULATORY_CARE_PROVIDER_SITE_OTHER): Payer: BC Managed Care – PPO | Admitting: Internal Medicine

## 2014-04-01 VITALS — BP 116/70 | HR 72 | Temp 98.1°F | Resp 16 | Ht 69.0 in | Wt 208.4 lb

## 2014-04-01 DIAGNOSIS — W57XXXA Bitten or stung by nonvenomous insect and other nonvenomous arthropods, initial encounter: Secondary | ICD-10-CM

## 2014-04-01 DIAGNOSIS — E782 Mixed hyperlipidemia: Secondary | ICD-10-CM

## 2014-04-01 DIAGNOSIS — E559 Vitamin D deficiency, unspecified: Secondary | ICD-10-CM | POA: Insufficient documentation

## 2014-04-01 DIAGNOSIS — Z79899 Other long term (current) drug therapy: Secondary | ICD-10-CM | POA: Insufficient documentation

## 2014-04-01 DIAGNOSIS — E119 Type 2 diabetes mellitus without complications: Secondary | ICD-10-CM

## 2014-04-01 DIAGNOSIS — I10 Essential (primary) hypertension: Secondary | ICD-10-CM

## 2014-04-01 DIAGNOSIS — S40861A Insect bite (nonvenomous) of right upper arm, initial encounter: Secondary | ICD-10-CM

## 2014-04-01 LAB — CBC WITH DIFFERENTIAL/PLATELET
Basophils Absolute: 0 10*3/uL (ref 0.0–0.1)
Basophils Relative: 0 % (ref 0–1)
Eosinophils Absolute: 0.3 10*3/uL (ref 0.0–0.7)
Eosinophils Relative: 4 % (ref 0–5)
HCT: 41.5 % (ref 39.0–52.0)
Hemoglobin: 14.5 g/dL (ref 13.0–17.0)
LYMPHS ABS: 2.4 10*3/uL (ref 0.7–4.0)
Lymphocytes Relative: 28 % (ref 12–46)
MCH: 31.5 pg (ref 26.0–34.0)
MCHC: 34.9 g/dL (ref 30.0–36.0)
MCV: 90.2 fL (ref 78.0–100.0)
MONOS PCT: 10 % (ref 3–12)
Monocytes Absolute: 0.9 10*3/uL (ref 0.1–1.0)
NEUTROS PCT: 58 % (ref 43–77)
Neutro Abs: 4.9 10*3/uL (ref 1.7–7.7)
Platelets: 156 10*3/uL (ref 150–400)
RBC: 4.6 MIL/uL (ref 4.22–5.81)
RDW: 13.1 % (ref 11.5–15.5)
WBC: 8.5 10*3/uL (ref 4.0–10.5)

## 2014-04-01 MED ORDER — DOXYCYCLINE HYCLATE 100 MG PO CAPS: ORAL_CAPSULE | ORAL | Status: DC

## 2014-04-01 NOTE — Progress Notes (Signed)
Patient ID: Joe Green, male   DOB: 08-Jun-1951, 63 y.o.   MRN: [id number]   This very nice 63 y.o.male presents for 3 month follow up with Hypertension, Hyperlipidemia, Pre-Diabetes and Vitamin D Deficiency. He also reports receiving 2 "bites" on the right forearm 2 days ago.    Patient is treated for HTN & BP has been controlled at home. Today's BP: 116/70 mmHg. Patient has ASCAD with DMI, and PTCA/Stent in 2005. Patient denies any cardiac type chest pain, palpitations, dyspnea/orthopnea/PND, dizziness, claudication, or dependent edema.   Hyperlipidemia is controlled with diet & meds. Patient denies myalgias or other med SE's. Last Lipids were  Cholesterol  122; HDL  42; LDL 59; Triglycerides 104 on 01/02/2014.   Also, the patient has history of T2_NIDDM (2005) and CBG's range less than 150 mg% . Patient denies any symptoms of reactive hypoglycemia, diabetic polys, paresthesias or visual blurring.  Last A1c was 7.3% on  01/02/2014.   Further, Patient has history of Vitamin D Deficiency and patient supplements vitamin D without any suspected side-effects. Last vitamin D was 52 on  01/02/2014.   Medication List   aspirin 325 MG EC tablet  Take 325 mg by mouth daily.     CHOLEST OFF PO  Take by mouth.     CRESTOR 20 MG tablet  Generic drug:  rosuvastatin  TAKE ONE TABLET BY MOUTH ONCE DAILY     metFORMIN 850 MG tablet  Commonly known as:  GLUCOPHAGE  TAKE ONE TABLET BY MOUTH TWICE DAILY     ramipril 5 MG capsule  Commonly known as:  ALTACE  Take 5 mg by mouth daily.     saxagliptin HCl 5 MG Tabs tablet  Commonly known as:  ONGLYZA  Take 1 tablet (5 mg total) by mouth daily.     VITAMIN D PO  Take 5,000 mg by mouth daily.     No Known Allergies  PMHx:   Past Medical History  Diagnosis Date  . CAD (coronary artery disease)     a. s/p stent to RCA 2005; b. LHC 2006 after Nuc with inf ischemia: patent RCA stent, D1 80%, LAD 60% => med Rx;  c. LHC 04/18/11: Proximal LAD 60%  (unchanged from previous catheterization), distal LAD 65% (small caliber), Dx 60%, proximal CFX 20%, mid CFX 50%, OM1 30%, proximal RCA 20%, mid RCA stent patent, EF 65%.=> med Rx  . HLD (hyperlipidemia)   . Hypertension   . OSA (obstructive sleep apnea)   . Type II or unspecified type diabetes mellitus without mention of complication, not stated as uncontrolled   . Obesity   . GERD (gastroesophageal reflux disease)   . Vitamin D deficiency    FHx:    Reviewed / unchanged SHx:    Reviewed / unchanged  Systems Review:  Constitutional: Denies fever, chills, wt changes, headaches, insomnia, fatigue, night sweats, change in appetite. Eyes: Denies redness, blurred vision, diplopia, discharge, itchy, watery eyes.  ENT: Denies discharge, congestion, post nasal drip, epistaxis, sore throat, earache, hearing loss, dental pain, tinnitus, vertigo, sinus pain, snoring.  CV: Denies chest pain, palpitations, irregular heartbeat, syncope, dyspnea, diaphoresis, orthopnea, PND, claudication or edema. Respiratory: denies cough, dyspnea, DOE, pleurisy, hoarseness, laryngitis, wheezing.  Gastrointestinal: Denies dysphagia, odynophagia, heartburn, reflux, water brash, abdominal pain or cramps, nausea, vomiting, bloating, diarrhea, constipation, hematemesis, melena, hematochezia  or hemorrhoids. Genitourinary: Denies dysuria, frequency, urgency, nocturia, hesitancy, discharge, hematuria or flank pain. Musculoskeletal: Denies arthralgias, myalgias, stiffness, jt. swelling, pain, limping or strain/sprain.  Skin: Two  Slightly indurated 1.5 cm ecchymotic areas with tiny central eschar over the volar surface of the right forearm. No lymphangitic streaking. Neuro: No weakness, tremor, incoordination, spasms, paresthesia or pain. Psychiatric: Denies confusion, memory loss or sensory loss. Endo: Denies change in weight, skin or hair change.  Heme/Lymph: No excessive bleeding, bruising or enlarged lymph  nodes.  Exam:  BP 116/70  Pulse 72  Temp 98.1 F   Resp 16  Ht 5\' 9"    Wt 208 lb 6.4 oz   BMI 30.76 kg/m2  Appears well nourished and in no distress. Eyes: PERRLA, EOMs, conjunctiva no swelling or erythema. Sinuses: No frontal/maxillary tenderness ENT/Mouth: EAC's clear, TM's nl w/o erythema, bulging. Nares clear w/o erythema, swelling, exudates. Oropharynx clear without erythema or exudates. Oral hygiene is good. Tongue normal, non obstructing. Hearing intact.  Neck: Supple. Thyroid nl. Car 2+/2+ without bruits, nodes or JVD. Chest: Respirations nl with BS clear & equal w/o rales, rhonchi, wheezing or stridor.  Cor: Heart sounds normal w/ regular rate and rhythm without sig. murmurs, gallops, clicks, or rubs. Peripheral pulses normal and equal  without edema.  Abdomen: Soft & bowel sounds normal. Non-tender w/o guarding, rebound, hernias, masses, or organomegaly.  Lymphatics: Unremarkable.  Musculoskeletal: Full ROM all peripheral extremities, joint stability, 5/5 strength, and normal gait.  Skin: Warm, dry without exposed rashes, lesions or ecchymosis apparent.  Neuro: Cranial nerves intact, reflexes equal bilaterally. Sensory-motor testing grossly intact. Tendon reflexes grossly intact.  Pysch: Alert & oriented x 3. Insight and judgement nl & appropriate. No ideations.  Assessment and Plan:  1. Hypertension/ CAD- Stents - Continue monitor blood pressure at home. Continue diet/meds same.  2. Hyperlipidemia - Continue diet/meds, exercise,& lifestyle modifications. Continue monitor periodic cholesterol/liver & renal functions   3. T2_NIDDM -  Vontinue same pending labs.  4. Vitamin D Deficiency - Continue supplementation.  5. Insect Bites - suspected spider or tick - cover empirically with Doxycycline  Recommended regular exercise, BP monitoring, weight control, and discussed med and SE's. Recommended labs to assess and monitor clinical status. Further disposition pending  results of labs.

## 2014-04-01 NOTE — Patient Instructions (Signed)

## 2014-04-02 LAB — LIPID PANEL
Cholesterol: 106 mg/dL (ref 0–200)
HDL: 38 mg/dL — ABNORMAL LOW (ref >39)
LDL Cholesterol: 53 mg/dL (ref 0–99)
Total CHOL/HDL Ratio: 2.8 Ratio
Triglycerides: 74 mg/dL (ref <150)
VLDL: 15 mg/dL (ref 0–40)

## 2014-04-02 LAB — BASIC METABOLIC PANEL WITH GFR
BUN: 13 mg/dL (ref 6–23)
CALCIUM: 9.1 mg/dL (ref 8.4–10.5)
CO2: 27 meq/L (ref 19–32)
Chloride: 102 mEq/L (ref 96–112)
Creat: 0.71 mg/dL (ref 0.50–1.35)
GFR, Est African American: >89 mL/min
GFR, Est Non African American: >89 mL/min
GLUCOSE: 132 mg/dL — AB (ref 70–99)
Potassium: 3.9 mEq/L (ref 3.5–5.3)
SODIUM: 136 meq/L (ref 135–145)

## 2014-04-02 LAB — HEPATIC FUNCTION PANEL
ALT: 19 U/L (ref 0–53)
AST: 16 U/L (ref 0–37)
Albumin: 4.2 g/dL (ref 3.5–5.2)
Alkaline Phosphatase: 93 U/L (ref 39–117)
BILIRUBIN DIRECT: 0.1 mg/dL (ref 0.0–0.3)
BILIRUBIN INDIRECT: 0.3 mg/dL (ref 0.2–1.2)
TOTAL PROTEIN: 6.8 g/dL (ref 6.0–8.3)
Total Bilirubin: 0.4 mg/dL (ref 0.2–1.2)

## 2014-04-02 LAB — MAGNESIUM: MAGNESIUM: 1.9 mg/dL (ref 1.5–2.5)

## 2014-04-02 LAB — INSULIN, FASTING: Insulin fasting, serum: 8 u[IU]/mL (ref 3–28)

## 2014-04-02 LAB — VITAMIN D 25 HYDROXY (VIT D DEFICIENCY, FRACTURES): Vit D, 25-Hydroxy: 68 ng/mL (ref 30–89)

## 2014-04-02 LAB — HEMOGLOBIN A1C
HEMOGLOBIN A1C: 7.2 % — AB (ref <5.7)
Mean Plasma Glucose: 160 mg/dL — ABNORMAL HIGH (ref <117)

## 2014-04-02 LAB — TSH: TSH: 1.34 u[IU]/mL (ref 0.350–4.500)

## 2014-04-07 ENCOUNTER — Other Ambulatory Visit: Payer: Self-pay | Admitting: Internal Medicine

## 2014-04-07 ENCOUNTER — Ambulatory Visit: Payer: Self-pay | Admitting: Internal Medicine

## 2014-04-24 ENCOUNTER — Ambulatory Visit: Payer: BC Managed Care – PPO | Admitting: Cardiovascular Disease

## 2014-06-05 ENCOUNTER — Ambulatory Visit (INDEPENDENT_AMBULATORY_CARE_PROVIDER_SITE_OTHER): Payer: BC Managed Care – PPO | Admitting: Cardiovascular Disease

## 2014-06-05 ENCOUNTER — Encounter: Payer: Self-pay | Admitting: Cardiovascular Disease

## 2014-06-05 VITALS — BP 118/68 | HR 62 | Ht 70.0 in | Wt 199.1 lb

## 2014-06-05 DIAGNOSIS — E1165 Type 2 diabetes mellitus with hyperglycemia: Secondary | ICD-10-CM

## 2014-06-05 DIAGNOSIS — I10 Essential (primary) hypertension: Secondary | ICD-10-CM

## 2014-06-05 DIAGNOSIS — E119 Type 2 diabetes mellitus without complications: Secondary | ICD-10-CM | POA: Insufficient documentation

## 2014-06-05 DIAGNOSIS — I2510 Atherosclerotic heart disease of native coronary artery without angina pectoris: Secondary | ICD-10-CM

## 2014-06-05 DIAGNOSIS — I2583 Coronary atherosclerosis due to lipid rich plaque: Principal | ICD-10-CM

## 2014-06-05 DIAGNOSIS — E782 Mixed hyperlipidemia: Secondary | ICD-10-CM

## 2014-06-05 HISTORY — DX: Type 2 diabetes mellitus without complications: E11.9

## 2014-06-05 NOTE — Assessment & Plan Note (Signed)
Well controlled.  Continue current medications and low sodium Dash type diet.    

## 2014-06-05 NOTE — Progress Notes (Signed)
Patient ID: Joe Green, male   DOB: [birth year], 63 y.o.   MRN: [id number] Joe Green is a 63 y.o. male who returns for evaluation of chest pain. He has a hx of CAD, s/p stent to RCA in 2005, DM2, HTN, HL. Last cath in 2006 after myoview that demonstrated inf ischemia was notable for a patent RCA stent but D1 80%, mLAD 60%. Patient last seen by me 03/2011. He was having increasing pain suggestive of angina. Cardiac catheterization was arranged. LHC 04/18/11: Proximal LAD 60% (unchanged from previous catheterization), distal LAD 65% (small caliber), Dx 60%, proximal CFX 20%, mid CFX 50%, OM1 30%, proximal RCA 20%, mid RCA stent patent, EF 65%. Films were reviewed from 2006 and there were no significant changes. PCI was not indicated. Medical therapy was continued at that time. Patient had done well after his LHC in 2012  Recent LDL 94 And A1c 7.4  Taking statin recent labs by [place] ok      ROS: Denies fever, malais, weight loss, blurry vision, decreased visual acuity, cough, sputum, SOB, hemoptysis, pleuritic pain, palpitaitons, heartburn, abdominal pain, melena, lower extremity edema, claudication, or rash.  All other systems reviewed and negative  General: Affect appropriate Healthy:  appears stated age 42: normal Neck supple with no adenopathy JVP normal no bruits no thyromegaly Lungs clear with no wheezing and good diaphragmatic motion Heart:  S1/S2 no murmur, no rub, gallop or click PMI normal Abdomen: benighn, BS positve, no tenderness, no AAA no bruit.  No HSM or HJR Distal pulses intact with no bruits No edema Neuro non-focal Skin warm and dry No muscular weakness   Current Outpatient Prescriptions  Medication Sig Dispense Refill  . aspirin 325 MG EC tablet Take 325 mg by mouth daily.        . Cholecalciferol (VITAMIN D PO) Take 5,000 mg by mouth daily.      . CRESTOR 20 MG tablet TAKE ONE TABLET BY MOUTH ONCE DAILY  30 tablet  3  . doxycycline (VIBRAMYCIN) 100 MG  capsule TAKE TWO CAPSULES BY MOUTH ONCE ON A FULL STOMACH, THEN TAKE 1 CAPSULE TWICE DAILY ON A FULL STOMACH FOR 4 MORE DAYS  10 capsule  0  . metFORMIN (GLUCOPHAGE) 850 MG tablet TAKE ONE TABLET BY MOUTH TWICE DAILY  60 tablet  6  . Plant Sterols and Stanols (CHOLEST OFF PO) Take by mouth.      . ramipril (ALTACE) 5 MG capsule Take 5 mg by mouth daily.        . saxagliptin HCl (ONGLYZA) 5 MG TABS tablet Take 1 tablet (5 mg total) by mouth daily.  30 tablet  5   Current Facility-Administered Medications  Medication Dose Route Frequency Provider Last Rate Last Dose  . 0.9 %  sodium chloride infusion  500 mL Intravenous Continuous Ladene Artist, MD        Allergies  Review of patient's allergies indicates no known allergies.  Electrocardiogram:  SR RBBB   Assessment and Plan

## 2014-06-05 NOTE — Assessment & Plan Note (Signed)
.  chog

## 2014-06-05 NOTE — Assessment & Plan Note (Signed)
Discussed low carb diet.  Target hemoglobin A1c is 6.5 or less.  Continue current medications.  

## 2014-06-05 NOTE — Patient Instructions (Signed)
Your physician wants you to follow-up in:  6 MONTHS WITH DR NISHAN  You will receive a reminder letter in the mail two months in advance. If you don't receive a letter, please call our office to schedule the follow-up appointment. Your physician recommends that you continue on your current medications as directed. Please refer to the Current Medication list given to you today. 

## 2014-06-05 NOTE — Assessment & Plan Note (Signed)
Stable with no angina and good activity level.  Continue medical Rx  

## 2014-07-13 ENCOUNTER — Other Ambulatory Visit: Payer: Self-pay | Admitting: *Deleted

## 2014-07-13 ENCOUNTER — Ambulatory Visit: Payer: Self-pay | Admitting: Internal Medicine

## 2014-07-13 MED ORDER — RAMIPRIL 5 MG PO CAPS: 5.0000 mg | ORAL_CAPSULE | Freq: Every day | ORAL | Status: DC

## 2014-07-22 ENCOUNTER — Other Ambulatory Visit: Payer: Self-pay | Admitting: Physician Assistant

## 2014-07-22 MED ORDER — DOXAZOSIN MESYLATE 8 MG PO TABS: 8.0000 mg | ORAL_TABLET | Freq: Every day | ORAL | Status: DC

## 2014-08-05 ENCOUNTER — Ambulatory Visit (INDEPENDENT_AMBULATORY_CARE_PROVIDER_SITE_OTHER): Payer: BC Managed Care – PPO | Admitting: Internal Medicine

## 2014-08-05 ENCOUNTER — Encounter: Payer: Self-pay | Admitting: Internal Medicine

## 2014-08-05 VITALS — BP 102/70 | HR 80 | Temp 98.1°F | Resp 16 | Ht 69.0 in | Wt 204.6 lb

## 2014-08-05 DIAGNOSIS — Z79899 Other long term (current) drug therapy: Secondary | ICD-10-CM

## 2014-08-05 DIAGNOSIS — E782 Mixed hyperlipidemia: Secondary | ICD-10-CM

## 2014-08-05 DIAGNOSIS — E119 Type 2 diabetes mellitus without complications: Secondary | ICD-10-CM

## 2014-08-05 DIAGNOSIS — E559 Vitamin D deficiency, unspecified: Secondary | ICD-10-CM

## 2014-08-05 DIAGNOSIS — I10 Essential (primary) hypertension: Secondary | ICD-10-CM

## 2014-08-05 LAB — CBC WITH DIFFERENTIAL/PLATELET
BASOS PCT: 0 % (ref 0–1)
Basophils Absolute: 0 10*3/uL (ref 0.0–0.1)
EOS ABS: 0.3 10*3/uL (ref 0.0–0.7)
Eosinophils Relative: 3 % (ref 0–5)
HCT: 45.3 % (ref 39.0–52.0)
Hemoglobin: 15.4 g/dL (ref 13.0–17.0)
Lymphocytes Relative: 19 % (ref 12–46)
Lymphs Abs: 2.1 10*3/uL (ref 0.7–4.0)
MCH: 31.2 pg (ref 26.0–34.0)
MCHC: 34 g/dL (ref 30.0–36.0)
MCV: 91.9 fL (ref 78.0–100.0)
MPV: 11.7 fL (ref 9.4–12.4)
Monocytes Absolute: 1 10*3/uL (ref 0.1–1.0)
Monocytes Relative: 9 % (ref 3–12)
Neutro Abs: 7.6 10*3/uL (ref 1.7–7.7)
Neutrophils Relative %: 69 % (ref 43–77)
PLATELETS: 158 10*3/uL (ref 150–400)
RBC: 4.93 MIL/uL (ref 4.22–5.81)
RDW: 13.1 % (ref 11.5–15.5)
WBC: 11 10*3/uL — ABNORMAL HIGH (ref 4.0–10.5)

## 2014-08-05 LAB — HEMOGLOBIN A1C
Hgb A1c MFr Bld: 7.8 % — ABNORMAL HIGH (ref <5.7)
Mean Plasma Glucose: 177 mg/dL — ABNORMAL HIGH (ref <117)

## 2014-08-05 LAB — LIPID PANEL
Cholesterol: 117 mg/dL (ref 0–200)
HDL: 43 mg/dL (ref >39)
LDL Cholesterol: 61 mg/dL (ref 0–99)
TRIGLYCERIDES: 64 mg/dL (ref <150)
Total CHOL/HDL Ratio: 2.7 Ratio
VLDL: 13 mg/dL (ref 0–40)

## 2014-08-05 LAB — BASIC METABOLIC PANEL WITH GFR
BUN: 15 mg/dL (ref 6–23)
CALCIUM: 9.5 mg/dL (ref 8.4–10.5)
CO2: 24 mEq/L (ref 19–32)
Chloride: 103 mEq/L (ref 96–112)
Creat: 0.7 mg/dL (ref 0.50–1.35)
GFR, Est African American: >89 mL/min
GFR, Est Non African American: >89 mL/min
Glucose, Bld: 164 mg/dL — ABNORMAL HIGH (ref 70–99)
Potassium: 4.4 mEq/L (ref 3.5–5.3)
Sodium: 138 mEq/L (ref 135–145)

## 2014-08-05 LAB — MAGNESIUM: MAGNESIUM: 1.7 mg/dL (ref 1.5–2.5)

## 2014-08-05 LAB — HEPATIC FUNCTION PANEL
ALT: 25 U/L (ref 0–53)
AST: 16 U/L (ref 0–37)
Albumin: 4.1 g/dL (ref 3.5–5.2)
Alkaline Phosphatase: 84 U/L (ref 39–117)
BILIRUBIN TOTAL: 0.6 mg/dL (ref 0.2–1.2)
Bilirubin, Direct: 0.2 mg/dL (ref 0.0–0.3)
Indirect Bilirubin: 0.4 mg/dL (ref 0.2–1.2)
TOTAL PROTEIN: 6.4 g/dL (ref 6.0–8.3)

## 2014-08-05 LAB — TSH: TSH: 1.444 u[IU]/mL (ref 0.350–4.500)

## 2014-08-05 NOTE — Patient Instructions (Signed)

## 2014-08-06 LAB — VITAMIN D 25 HYDROXY (VIT D DEFICIENCY, FRACTURES): Vit D, 25-Hydroxy: 51 ng/mL (ref 30–100)

## 2014-08-06 LAB — INSULIN, FASTING: INSULIN FASTING, SERUM: 5.5 u[IU]/mL (ref 2.0–19.6)

## 2014-08-09 ENCOUNTER — Encounter: Payer: Self-pay | Admitting: Internal Medicine

## 2014-08-09 NOTE — Progress Notes (Signed)
Patient ID: Joe Green, male   DOB: [birth year], 63 y.o.   MRN: [id number]   This very nice 63 y.o.MWM presents for 3 month follow up with Hypertension, Hyperlipidemia, T2_NIDDM and Vitamin D Deficiency.    Patient is treated for HTN (2013) & BP has been controlled at home. Today's BP: 102/70 mmHg. Patient has had no complaints of any cardiac type chest pain, palpitations, dyspnea/orthopnea/PND, dizziness, claudication, or dependent edema.  Hyperlipidemia is controlled with diet & meds. Patient denies myalgias or other med SE's. Last Lipids were at goal - Total  Chol 117; HDL 43; LDL  61; Trig 64 on 08/05/2014.   Also, the patient has history of T2_NIDDM (2005) and has had no symptoms of reactive hypoglycemia, diabetic polys, paresthesias or visual blurring, but relates his CBG's have been running in the 150-160's range & sometimes higher..  Last A1c is high 7.8% on 08/05/2014.   Further, the patient also has history of Vitamin D Deficiency and supplements vitamin D without any suspected side-effects. Last vitamin D was  51 on  08/05/2014.   Medication List   CRESTOR 20 MG tablet  Generic drug:  rosuvastatin  take Monday, Wednesday and Friday.     doxazosin 8 MG tablet  Commonly known as:  CARDURA  Take 1 tablet (8 mg total) by mouth daily.     metFORMIN 850 MG tablet  Commonly known as:  GLUCOPHAGE  TAKE ONE TABLET BY MOUTH TWICE DAILY     ramipril 5 MG capsule  Commonly known as:  ALTACE  Take 1 capsule (5 mg total) by mouth daily.     saxagliptin HCl 5 MG Tabs tablet  Commonly known as:  ONGLYZA  Take 1 tablet (5 mg total) by mouth daily.     VITAMIN D PO  Take 5,000 mg by mouth daily.     No Known Allergies  PMHx:   Past Medical History  Diagnosis Date  . CAD (coronary artery disease)     a. s/p stent to RCA 2005; b. LHC 2006 after Nuc with inf ischemia: patent RCA stent, D1 80%, LAD 60% => med Rx;  c. LHC 04/18/11: Proximal LAD 60% (unchanged from previous  catheterization), distal LAD 65% (small caliber), Dx 60%, proximal CFX 20%, mid CFX 50%, OM1 30%, proximal RCA 20%, mid RCA stent patent, EF 65%.=> med Rx  . HLD (hyperlipidemia)   . Hypertension   . OSA (obstructive sleep apnea)   . Type II or unspecified type diabetes mellitus without mention of complication, not stated as uncontrolled   . Obesity   . GERD (gastroesophageal reflux disease)   . Vitamin D deficiency    Immunization History  Administered Date(s) Administered  . Influenza-Unspecified 06/02/2013  . Pneumococcal Polysaccharide-23 08/27/2007  . Tdap 08/27/2007  . Zoster 10/20/2013   Past Surgical History  Procedure Laterality Date  . Heart stent  2005    RCA  . Colonoscopy  11/2011    Due 5 years Dr. Fuller Plan  . Polypectomy     FHx:    Reviewed / unchanged  SHx:    Reviewed / unchanged  Systems Review:  Constitutional: Denies fever, chills, wt changes, headaches, insomnia, fatigue, night sweats, change in appetite. Eyes: Denies redness, blurred vision, diplopia, discharge, itchy, watery eyes.  ENT: Denies discharge, congestion, post nasal drip, epistaxis, sore throat, earache, hearing loss, dental pain, tinnitus, vertigo, sinus pain, snoring.  CV: Denies chest pain, palpitations, irregular heartbeat, syncope, dyspnea, diaphoresis, orthopnea, PND, claudication or edema. Respiratory:  denies cough, dyspnea, DOE, pleurisy, hoarseness, laryngitis, wheezing.  Gastrointestinal: Denies dysphagia, odynophagia, heartburn, reflux, water brash, abdominal pain or cramps, nausea, vomiting, bloating, diarrhea, constipation, hematemesis, melena, hematochezia  or hemorrhoids. Genitourinary: Denies dysuria, frequency, urgency, nocturia, hesitancy, discharge, hematuria or flank pain. Musculoskeletal: Denies arthralgias, myalgias, stiffness, jt. swelling, pain, limping or strain/sprain.  Skin: Denies pruritus, rash, hives, warts, acne, eczema or change in skin lesion(s). Neuro: No  weakness, tremor, incoordination, spasms, paresthesia or pain. Psychiatric: Denies confusion, memory loss or sensory loss. Endo: Denies change in weight, skin or hair change.  Heme/Lymph: No excessive bleeding, bruising or enlarged lymph nodes.  Physical Exam  BP 102/70 mmHg  Pulse 80  Temp(Src) 98.1 F (36.7 C)  Resp 16  Ht 5\' 9"  (1.753 m)  Wt 204 lb 9.6 oz (92.806 kg)  BMI 30.20 kg/m2  Appears well nourished and in no distress. Eyes: PERRLA, EOMs, conjunctiva no swelling or erythema. Sinuses: No frontal/maxillary tenderness ENT/Mouth: EAC's clear, TM's nl w/o erythema, bulging. Nares clear w/o erythema, swelling, exudates. Oropharynx clear without erythema or exudates. Oral hygiene is good. Tongue normal, non obstructing. Hearing intact.  Neck: Supple. Thyroid nl. Car 2+/2+ without bruits, nodes or JVD. Chest: Respirations nl with BS clear & equal w/o rales, rhonchi, wheezing or stridor.  Cor: Heart sounds normal w/ regular rate and rhythm without sig. murmurs, gallops, clicks, or rubs. Peripheral pulses normal and equal  without edema.  Abdomen: Soft & bowel sounds normal. Non-tender w/o guarding, rebound, hernias, masses, or organomegaly.  Lymphatics: Unremarkable.  Musculoskeletal: Full ROM all peripheral extremities, joint stability, 5/5 strength, and normal gait.  Skin: Warm, dry without exposed rashes, lesions or ecchymosis apparent.  Neuro: Cranial nerves intact, reflexes equal bilaterally. Sensory-motor testing grossly intact. Tendon reflexes grossly intact.  Pysch: Alert & oriented x 3.  Insight and judgement nl & appropriate. No ideations.  Assessment and Plan:  1. Hypertension - Continue monitor blood pressure at home. Continue diet/meds same.  2. Hyperlipidemia - Continue diet/meds, exercise,& lifestyle modifications. Continue monitor periodic cholesterol/liver & renal functions   3. T2_NIDDM - Continue diet, exercise, lifestyle modifications. Monitor appropriate  labs. Advised to increase MF from 2 up to 4 tabs /day .  4. Vitamin D Deficiency - Continue supplementation.   Recommended regular exercise, BP monitoring, weight control, and discussed med and SE's. Recommended labs to assess and monitor clinical status. Further disposition pending results of labs.

## 2014-08-14 ENCOUNTER — Encounter: Payer: Self-pay | Admitting: *Deleted

## 2014-10-05 ENCOUNTER — Ambulatory Visit (INDEPENDENT_AMBULATORY_CARE_PROVIDER_SITE_OTHER): Payer: BLUE CROSS/BLUE SHIELD | Admitting: Physician Assistant

## 2014-10-05 VITALS — BP 122/70 | HR 72 | Temp 98.1°F | Resp 16 | Ht 69.0 in | Wt 203.0 lb

## 2014-10-05 DIAGNOSIS — N400 Enlarged prostate without lower urinary tract symptoms: Secondary | ICD-10-CM

## 2014-10-05 DIAGNOSIS — I2583 Coronary atherosclerosis due to lipid rich plaque: Secondary | ICD-10-CM

## 2014-10-05 DIAGNOSIS — Z0001 Encounter for general adult medical examination with abnormal findings: Secondary | ICD-10-CM

## 2014-10-05 DIAGNOSIS — R972 Elevated prostate specific antigen [PSA]: Secondary | ICD-10-CM

## 2014-10-05 DIAGNOSIS — G2581 Restless legs syndrome: Secondary | ICD-10-CM

## 2014-10-05 DIAGNOSIS — IMO0001 Reserved for inherently not codable concepts without codable children: Secondary | ICD-10-CM

## 2014-10-05 DIAGNOSIS — I2510 Atherosclerotic heart disease of native coronary artery without angina pectoris: Secondary | ICD-10-CM

## 2014-10-05 DIAGNOSIS — K4090 Unilateral inguinal hernia, without obstruction or gangrene, not specified as recurrent: Secondary | ICD-10-CM

## 2014-10-05 DIAGNOSIS — E782 Mixed hyperlipidemia: Secondary | ICD-10-CM

## 2014-10-05 DIAGNOSIS — R6889 Other general symptoms and signs: Secondary | ICD-10-CM

## 2014-10-05 DIAGNOSIS — G4733 Obstructive sleep apnea (adult) (pediatric): Secondary | ICD-10-CM

## 2014-10-05 DIAGNOSIS — E119 Type 2 diabetes mellitus without complications: Secondary | ICD-10-CM

## 2014-10-05 DIAGNOSIS — F17200 Nicotine dependence, unspecified, uncomplicated: Secondary | ICD-10-CM

## 2014-10-05 DIAGNOSIS — J449 Chronic obstructive pulmonary disease, unspecified: Secondary | ICD-10-CM

## 2014-10-05 DIAGNOSIS — I10 Essential (primary) hypertension: Secondary | ICD-10-CM

## 2014-10-05 DIAGNOSIS — E559 Vitamin D deficiency, unspecified: Secondary | ICD-10-CM

## 2014-10-05 DIAGNOSIS — Z79899 Other long term (current) drug therapy: Secondary | ICD-10-CM

## 2014-10-05 HISTORY — DX: Benign prostatic hyperplasia without lower urinary tract symptoms: N40.0

## 2014-10-05 HISTORY — DX: Chronic obstructive pulmonary disease, unspecified: J44.9

## 2014-10-05 HISTORY — DX: Restless legs syndrome: G25.81

## 2014-10-05 LAB — CBC WITH DIFFERENTIAL/PLATELET
Basophils Absolute: 0 10*3/uL (ref 0.0–0.1)
Basophils Relative: 0 % (ref 0–1)
Eosinophils Absolute: 0.3 10*3/uL (ref 0.0–0.7)
Eosinophils Relative: 2 % (ref 0–5)
HEMATOCRIT: 47.4 % (ref 39.0–52.0)
Hemoglobin: 16.2 g/dL (ref 13.0–17.0)
Lymphocytes Relative: 16 % (ref 12–46)
Lymphs Abs: 2 10*3/uL (ref 0.7–4.0)
MCH: 31.9 pg (ref 26.0–34.0)
MCHC: 34.2 g/dL (ref 30.0–36.0)
MCV: 93.3 fL (ref 78.0–100.0)
MPV: 12.1 fL (ref 8.6–12.4)
Monocytes Absolute: 0.9 10*3/uL (ref 0.1–1.0)
Monocytes Relative: 7 % (ref 3–12)
NEUTROS ABS: 9.6 10*3/uL — AB (ref 1.7–7.7)
Neutrophils Relative %: 75 % (ref 43–77)
Platelets: 156 10*3/uL (ref 150–400)
RBC: 5.08 MIL/uL (ref 4.22–5.81)
RDW: 12.9 % (ref 11.5–15.5)
WBC: 12.8 10*3/uL — AB (ref 4.0–10.5)

## 2014-10-05 LAB — HEMOGLOBIN A1C
Hgb A1c MFr Bld: 8 % — ABNORMAL HIGH (ref <5.7)
Mean Plasma Glucose: 183 mg/dL — ABNORMAL HIGH (ref <117)

## 2014-10-05 NOTE — Progress Notes (Signed)
Complete Physical  Assessment and Plan: 1. Essential hypertension - continue medications, DASH diet, exercise and monitor at home. Call if greater than 130/80.  - CBC with Differential/Platelet - BASIC METABOLIC PANEL WITH GFR - Hepatic function panel - TSH  2. Coronary artery disease due to lipid rich plaque Control blood pressure, cholesterol, glucose, increase exercise.  Continue cardio follow up Smoking cessation advised  3. Mixed hyperlipidemia -continue medications, check lipids, decrease fatty foods, increase activity. Goal is less than 70 - Lipid panel  4. Type 2 diabetes mellitus without complication Discussed general issues about diabetes pathophysiology and management., Educational material distributed., Suggested low cholesterol diet., Encouraged aerobic exercise., Discussed foot care., Reminded to get yearly retinal exam. May have to switch meds/add medications if not at goal of 6.5 - Hemoglobin A1c - Insulin, fasting - Urinalysis, Routine w reflex microscopic - Microalbumin / creatinine urine ratio - HM DIABETES FOOT EXAM  5. OSA (obstructive sleep apnea) On mouth piece, follows with Dr. Toy Cookey  6. Smoking Smoking cessation-  instruction/counseling given, counseled patient on the dangers of tobacco use, advised patient to stop smoking, and reviewed strategies to maximize success - DG Chest 2 View; Future  7. Vitamin D deficiency - Vit D  25 hydroxy (rtn osteoporosis monitoring)  8. Medication management - Magnesium  9. Elevated PSA - PSA  10. RLS (restless legs syndrome) Declines meds at this time.  - Vitamin B12 - Iron and TIBC - Ferritin  11. Right inguinal hernia Increasing pain, will refer to gen surgeon.  - Ambulatory referral to General Surgery  12. Encounter for general adult medical examination with abnormal findings - DG Chest 2 View; Future - HM DIABETES FOOT EXAM  13. Chronic obstructive pulmonary disease, unspecified COPD,  unspecified chronic bronchitis type Advised to stop smoking, will get CXR, continue meds.   14. BPH (benign prostatic hyperplasia) Continue meds   Discussed med's effects and SE's. Screening labs and tests as requested with regular follow-up as recommended.  HPI 64 y.o. male  presents for a complete physical. His blood pressure has been controlled at home, today their BP is BP: 122/70 mmHg He is active but does not regularly exercise, denies chest pain, shortness of breath, dizziness.  He has a history of CAD s/p stent RCA in 2005, last cath 2012 and was unchanged, being treated medially and following with Dr. Johnsie Cancel.  His cholesterol is diet controlled. In addition he is on crestor 20mg   3 days a week and denies myalgias. His cholesterol is at goal less than 70. The cholesterol last visit was:   Lab Results  Component Value Date   CHOL 117 08/05/2014   HDL 43 08/05/2014   LDLCALC 61 08/05/2014   TRIG 64 08/05/2014   CHOLHDL 2.7 08/05/2014   He has been working on diet and exercise for Diabetes with other circulatory complications, he is on ASA, he is on ACE/ARB, he has been having a hard time getting down his sugars, it is running 160's, he is on metformin 850 BID and onglyza 5mg , and denies paresthesia of the feet, polydipsia, polyuria and visual disturbances. Last A1C was:  Lab Results  Component Value Date   HGBA1C 7.8* 08/05/2014  He has BPH and is on cardura which helps. He has a history of elevated PSA in the past.  Lab Results  Component Value Date   PSA 3.84 01/02/2014   PSA 4.09* 10/03/2013  Patient is on Vitamin D supplement, 7000 IU daily.   Still smoking but  states that he has cut back on smoking.  Has OSA and he is on the dental appliance.  History of right inguinal hernia, saw Dr. Rise Patience in 2003, he has since reitired and he was told to monitor. He is having increasing pain with it with activity and would like it reevaluated.    Current Medications:  Current  Outpatient Prescriptions on File Prior to Visit  Medication Sig Dispense Refill  . aspirin 325 MG EC tablet Take 325 mg by mouth daily.      . Cholecalciferol (VITAMIN D PO) Take 5,000 mg by mouth daily.    Marland Kitchen doxazosin (CARDURA) 8 MG tablet Take 1 tablet (8 mg total) by mouth daily. 90 tablet 1  . metFORMIN (GLUCOPHAGE) 850 MG tablet TAKE ONE TABLET BY MOUTH TWICE DAILY 60 tablet 6  . ramipril (ALTACE) 5 MG capsule Take 1 capsule (5 mg total) by mouth daily. 90 capsule 1  . rosuvastatin (CRESTOR) 20 MG tablet take Monday, Wednesday and Friday.    . saxagliptin HCl (ONGLYZA) 5 MG TABS tablet Take 1 tablet (5 mg total) by mouth daily. 30 tablet 5     Health Maintenance:  Immunization History  Administered Date(s) Administered  . Influenza-Unspecified 06/02/2013, 08/18/2014  . Pneumococcal Polysaccharide-23 08/27/2007  . Tdap 08/27/2007  . Zoster 10/20/2013   Tetanus: 2009 Pneumovax: 2009 Prevnar13: will wait until 66 Flu vaccine: 05/2015 Zostavax: 2015 DEXA: N/A Colonoscopy: 10/2011 due 2018 Dr. Fuller Plan EGD: N/A CT head 2012 Carotid US 2012 2012 DEE last year- [facility]- wears glasses Dentist: [place] [facility]   Allergies: No Known Allergies Medical History:  Past Medical History  Diagnosis Date  . CAD (coronary artery disease)     a. s/p stent to RCA 2005; b. LHC 2006 after Nuc with inf ischemia: patent RCA stent, D1 80%, LAD 60% => med Rx;  c. LHC 04/18/11: Proximal LAD 60% (unchanged from previous catheterization), distal LAD 65% (small caliber), Dx 60%, proximal CFX 20%, mid CFX 50%, OM1 30%, proximal RCA 20%, mid RCA stent patent, EF 65%.=> med Rx  . HLD (hyperlipidemia)   . Hypertension   . OSA (obstructive sleep apnea)   . Type II or unspecified type diabetes mellitus without mention of complication, not stated as uncontrolled   . Obesity   . GERD (gastroesophageal reflux disease)   . Vitamin D deficiency    Surgical History:  Past Surgical History   Procedure Laterality Date  . Heart stent  2005    RCA  . Colonoscopy  11/2011    Due 5 years Dr. Fuller Plan  . Polypectomy     Family History:  Family History  Problem Relation Age of Onset  . Prostate cancer Father   . Stroke Father   . Hypertension Father    Social History:  History  Substance Use Topics  . Smoking status: Current Every Day Smoker -- 0.50 packs/day for 40 years    Types: Cigarettes  . Smokeless tobacco: Never Used  . Alcohol Use: Yes   Review of Systems:  Review of Systems  Constitutional: Negative.   HENT: Negative.   Respiratory: Negative.  Negative for cough, shortness of breath and wheezing.   Cardiovascular: Negative.  Negative for chest pain and claudication.  Gastrointestinal: Positive for heartburn. Negative for nausea, vomiting, abdominal pain, diarrhea, constipation, blood in stool and melena.       + right inguinal hernia, has had evaluated in past by surgeon that has retired, + increasing pain, would like referral  Genitourinary: Negative.   Musculoskeletal: Positive for myalgias ( RLS) and back pain. Negative for joint pain, falls and neck pain.  Skin: Negative.   Neurological: Negative.  Negative for dizziness.  Psychiatric/Behavioral: Negative.    Physical Exam: Estimated body mass index is 29.96 kg/(m^2) as calculated from the following:   Height as of this encounter: 5\' 9"  (1.753 m).   Weight as of this encounter: 203 lb (92.08 kg). Filed Vitals:   10/05/14 0854  BP: 122/70  Pulse: 72  Temp: 98.1 F (36.7 C)  Resp: 16   General Appearance: Well nourished, in no apparent distress. Eyes: PERRLA, EOMs, conjunctiva no swelling or erythema, normal fundi and vessels. Sinuses: No Frontal/maxillary tenderness ENT/Mouth: Ext aud canals clear, normal light reflex with TMs without erythema, bulging. Good dentition. No erythema, swelling, or exudate on post pharynx. Tonsils not swollen or erythematous. Hearing normal.  Neck: Supple, thyroid  normal. No bruits Respiratory: Respiratory effort normal, BS equal bilaterally without rales, rhonchi, wheezing or stridor. Cardio: RRR without murmurs, rubs or gallops. Brisk peripheral pulses without edema.  Chest: symmetric, with normal excursions and percussion. Abdomen: Soft, +BS. Non tender, no guarding, rebound, hernias, masses, or organomegaly. + right inguinal hernia Lymphatics: Non tender without lymphadenopathy.  Genitourinary: defer Musculoskeletal: Full ROM all peripheral extremities,5/5 strength, and normal gait. Skin: Warm, dry without rashes, lesions, ecchymosis. Foot exam normal.  Neuro: Cranial nerves intact, reflexes equal bilaterally. Normal muscle tone, no cerebellar symptoms. Sensation intact.  Psych: Awake and oriented X 3, normal affect, Insight and Judgment appropriate.   EKG: WNL no changes, CRBBB- Defer Dr. Jaynee Eagles, [place] 9:03 AM

## 2014-10-05 NOTE — Patient Instructions (Addendum)
Use a dropper or use a cap to put olive oil,mineral oil or canola oil in the effected ear- 2-3 times a week. Let it soak for 20-30 min then you can take a shower or use a baby bulb with warm water to wash out the ear wax.  Do not use Qtips  Diabetes is a very complicated disease...lets simplify it.  An easy way to look at it to understand the complications is if you think of the extra sugar floating in your blood stream as glass shards floating through your blood stream.    Diabetes affects your small vessels first: 1) The glass shards (sugar) scraps down the tiny blood vessels in your eyes and lead to diabetic retinopathy, the leading cause of blindness in the [place]. Diabetes is the leading cause of newly diagnosed adult (11 to 63 years of age) blindness in the [place].  2) The glass shards scratches down the tiny vessels of your legs leading to nerve damage called neuropathy and can lead to amputations of your feet. More than 60% of all non-traumatic amputations of lower limbs occur in people with diabetes.  3) Over time the small vessels in your brain are shredded and closed off, individually this does not cause any problems but over a long period of time many of the small vessels being blocked can lead to Vascular Dementia.   4) Your kidney's are a filter system and have a "net" that keeps certain things in the body and lets bad things out. Sugar shreds this net and leads to kidney damage and eventually failure. Decreasing the sugar that is destroying the net and certain blood pressure medications can help stop or decrease progression of kidney disease. Diabetes was the primary cause of kidney failure in 44 percent of all new cases in 2011.  5) Diabetes also destroys the small vessels in your penis that lead to erectile dysfunction. Eventually the vessels are so damaged that you may not be responsive to cialis or viagra.   Diabetes and your large vessels: Your larger vessels consist of your  coronary arteries in your heart and the carotid vessels to your brain. Diabetes or even increased sugars put you at 300% increased risk of heart attack and stroke and this is why.. The sugar scrapes down your large blood vessels and your body sees this as an internal injury and tries to repair itself. Just like you get a scab on your skin, your platelets will stick to the blood vessel wall trying to heal it. This is why we have diabetics on low dose aspirin daily, this prevents the platelets from sticking and can prevent plaque formation. In addition, your body takes cholesterol and tries to shove it into the open wound. This is why we want your LDL, or bad cholesterol, below 70.   The combination of platelets and cholesterol over 5-10 years forms plaque that can break off and cause a heart attack or stroke.   PLEASE REMEMBER:  Diabetes is preventable! Up to 63 percent of complications and morbidities among individuals with type 2 diabetes can be prevented, delayed, or effectively treated and minimized with regular visits to a health professional, appropriate monitoring and medication, and a healthy diet and lifestyle.  Before you even begin to attack a weight-loss plan, it pays to remember this: You are not fat. You have fat. Losing weight isn't about blame or shame; it's simply another achievement to accomplish. Dieting is like any other skill-you have to buckle down and  work at it. As long as you act in a smart, reasonable way, you'll ultimately get where you want to be. Here are some weight loss pearls for you.  1. It's Not a Diet. It's a Lifestyle Thinking of a diet as something you're on and suffering through only for the short term doesn't work. To shed weight and keep it off, you need to make permanent changes to the way you eat. It's OK to indulge occasionally, of course, but if you cut calories temporarily and then revert to your old way of eating, you'll gain back the weight quicker than you can  say yo-yo. Use it to lose it. Research shows that one of the best predictors of long-term weight loss is how many pounds you drop in the first month. For that reason, nutritionists often suggest being stricter for the first two weeks of your new eating strategy to build momentum. Cut out added sugar and alcohol and avoid unrefined carbs. After that, figure out how you can reincorporate them in a way that's healthy and maintainable.  2. There's a Right Way to Exercise Working out burns calories and fat and boosts your metabolism by building muscle. But those trying to lose weight are notorious for overestimating the number of calories they burn and underestimating the amount they take in. Unfortunately, your system is biologically programmed to hold on to extra pounds and that means when you start exercising, your body senses the deficit and ramps up its hunger signals. If you're not diligent, you'll eat everything you burn and then some. Use it to lose it. Cardio gets all the exercise glory, but strength and interval training are the real heroes. They help you build lean muscle, which in turn increases your metabolism and calorie-burning ability 3. Don't Overreact to Mild Hunger Some people have a hard time losing weight because of hunger anxiety. To them, being hungry is bad-something to be avoided at all costs-so they carry snacks with them and eat when they don't need to. Others eat because they're stressed out or bored. While you never want to get to the point of being ravenous (that's when bingeing is likely to happen), a hunger pang, a craving, or the fact that it's 3:00 p.m. should not send you racing for the vending machine or obsessing about the energy bar in your purse. Ideally, you should put off eating until your stomach is growling and it's difficult to concentrate.  Use it to lose it. When you feel the urge to eat, use the HALT method. Ask yourself, Am I really hungry? Or am I angry or anxious,  lonely or bored, or tired? If you're still not certain, try the apple test. If you're truly hungry, an apple should seem delicious; if it doesn't, something else is going on. Or you can try drinking water and making yourself busy, if you are still hungry try a healthy snack.  4. Not All Calories Are Created Equal The mechanics of weight loss are pretty simple: Take in fewer calories than you use for energy. But the kind of food you eat makes all the difference. Processed food that's high in saturated fat and refined starch or sugar can cause inflammation that disrupts the hormone signals that tell your brain you're full. The result: You eat a lot more.  Use it to lose it. Clean up your diet. Swap in whole, unprocessed foods, including vegetables, lean protein, and healthy fats that will fill you up and give you the biggest nutritional  bang for your calorie buck. In a few weeks, as your brain starts receiving regular hunger and fullness signals once again, you'll notice that you feel less hungry overall and naturally start cutting back on the amount you eat.  5. Protein, Produce, and Plant-Based Fats Are Your Weight-Loss Trinity Here's why eating the three Ps regularly will help you drop pounds. Protein fills you up. You need it to build lean muscle, which keeps your metabolism humming so that you can torch more fat. People in a weight-loss program who ate double the recommended daily allowance for protein (about 110 grams for a 150-pound woman) lost 70 percent of their weight from fat, while people who ate the RDA lost only about 40 percent, one study found. Produce is packed with filling fiber. "It's very difficult to consume too many calories if you're eating a lot of vegetables. Example: Three cups of broccoli is a lot of food, yet only 93 calories. (Fruit is another story. It can be easy to overeat and can contain a lot of calories from sugar, so be sure to monitor your intake.) Plant-based fats like  olive oil and those in avocados and nuts are healthy and extra satiating.  Use it to lose it. Aim to incorporate each of the three Ps into every meal and snack. People who eat protein throughout the day are able to keep weight off, according to a study in the [place] of Clinical Nutrition. In addition to meat, poultry and seafood, good sources are beans, lentils, eggs, tofu, and yogurt. As for fat, keep portion sizes in check by measuring out salad dressing, oil, and nut butters (shoot for one to two tablespoons). Finally, eat veggies or a little fruit at every meal. People who did that consumed 308 fewer calories but didn't feel any hungrier than when they didn't eat more produce.  7. How You Eat Is As Important As What You Eat In order for your brain to register that you're full, you need to focus on what you're eating. Sit down whenever you eat, preferably at a table. Turn off the TV or computer, put down your phone, and look at your food. Smell it. Chew slowly, and don't put another bite on your fork until you swallow. When women ate lunch this attentively, they consumed 30 percent less when snacking later than those who listened to an audiobook at lunchtime, according to a study in the [place] of Nutrition. 8. Weighing Yourself Really Works The scale provides the best evidence about whether your efforts are paying off. Seeing the numbers tick up or down or stagnate is motivation to keep going-or to rethink your approach. A 2015 study at [facility] found that daily weigh-ins helped people lose more weight, keep it off, and maintain that loss, even after two years. Use it to lose it. Step on the scale at the same time every day for the best results. If your weight shoots up several pounds from one weigh-in to the next, don't freak out. Eating a lot of salt the night before or having your period is the likely culprit. The number should return to normal in a day or two. It's a steady  climb that you need to do something about. 9. Too Much Stress and Too Little Sleep Are Your Enemies When you're tired and frazzled, your body cranks up the production of cortisol, the stress hormone that can cause carb cravings. Not getting enough sleep also boosts your levels of ghrelin, a hormone  associated with hunger, while suppressing leptin, a hormone that signals fullness and satiety. People on a diet who slept only five and a half hours a night for two weeks lost 55 percent less fat and were hungrier than those who slept eight and a half hours, according to a study in the [place]. Use it to lose it. Prioritize sleep, aiming for seven hours or more a night, which research shows helps lower stress. And make sure you're getting quality zzz's. If a snoring spouse or a fidgety cat wakes you up frequently throughout the night, you may end up getting the equivalent of just four hours of sleep, according to a study from [facility]. Keep pets out of the bedroom, and use a white-noise app to drown out snoring. 10. You Will Hit a plateau-And You Can Bust Through It As you slim down, your body releases much less leptin, the fullness hormone.  If you're not strength training, start right now. Building muscle can raise your metabolism to help you overcome a plateau. To keep your body challenged and burning calories, incorporate new moves and more intense intervals into your workouts or add another sweat session to your weekly routine. Alternatively, cut an extra 100 calories or so a day from your diet. Now that you've lost weight, your body simply doesn't need as much fuel.

## 2014-10-06 LAB — LIPID PANEL
Cholesterol: 119 mg/dL (ref 0–200)
HDL: 44 mg/dL (ref >=40)
LDL Cholesterol: 52 mg/dL (ref 0–99)
Total CHOL/HDL Ratio: 2.7 Ratio
Triglycerides: 116 mg/dL (ref <150)
VLDL: 23 mg/dL (ref 0–40)

## 2014-10-06 LAB — URINALYSIS, ROUTINE W REFLEX MICROSCOPIC
BILIRUBIN URINE: NEGATIVE
GLUCOSE, UA: NEGATIVE mg/dL
Hgb urine dipstick: NEGATIVE
Ketones, ur: NEGATIVE mg/dL
Leukocytes, UA: NEGATIVE
Nitrite: NEGATIVE
PH: 7 (ref 5.0–8.0)
Protein, ur: NEGATIVE mg/dL
SPECIFIC GRAVITY, URINE: 1.025 (ref 1.005–1.030)
Urobilinogen, UA: 1 mg/dL (ref 0.0–1.0)

## 2014-10-06 LAB — HEPATIC FUNCTION PANEL
ALK PHOS: 88 U/L (ref 39–117)
ALT: 24 U/L (ref 0–53)
AST: 15 U/L (ref 0–37)
Albumin: 4.4 g/dL (ref 3.5–5.2)
BILIRUBIN INDIRECT: 0.5 mg/dL (ref 0.2–1.2)
Bilirubin, Direct: 0.1 mg/dL (ref 0.0–0.3)
Total Bilirubin: 0.6 mg/dL (ref 0.2–1.2)
Total Protein: 7 g/dL (ref 6.0–8.3)

## 2014-10-06 LAB — BASIC METABOLIC PANEL WITH GFR
BUN: 10 mg/dL (ref 6–23)
CHLORIDE: 101 meq/L (ref 96–112)
CO2: 28 mEq/L (ref 19–32)
CREATININE: 0.77 mg/dL (ref 0.50–1.35)
Calcium: 9.7 mg/dL (ref 8.4–10.5)
GFR, Est Non African American: >89 mL/min
Glucose, Bld: 179 mg/dL — ABNORMAL HIGH (ref 70–99)
Potassium: 4.2 mEq/L (ref 3.5–5.3)
SODIUM: 137 meq/L (ref 135–145)

## 2014-10-06 LAB — VITAMIN D 25 HYDROXY (VIT D DEFICIENCY, FRACTURES): Vit D, 25-Hydroxy: 54 ng/mL (ref 30–100)

## 2014-10-06 LAB — INSULIN, FASTING: Insulin fasting, serum: 3.7 u[IU]/mL (ref 2.0–19.6)

## 2014-10-06 LAB — MICROALBUMIN / CREATININE URINE RATIO
Creatinine, Urine: 169.7 mg/dL
Microalb Creat Ratio: 21.2 mg/g (ref 0.0–30.0)
Microalb, Ur: 3.6 mg/dL — ABNORMAL HIGH (ref <2.0)

## 2014-10-06 LAB — FERRITIN: Ferritin: 70 ng/mL (ref 22–322)

## 2014-10-06 LAB — TSH: TSH: 1.899 u[IU]/mL (ref 0.350–4.500)

## 2014-10-06 LAB — IRON AND TIBC
%SAT: 32 % (ref 20–55)
IRON: 116 ug/dL (ref 42–165)
TIBC: 357 ug/dL (ref 215–435)
UIBC: 241 ug/dL (ref 125–400)

## 2014-10-06 LAB — PSA: PSA: 3.6 ng/mL (ref <=4.00)

## 2014-10-06 LAB — MAGNESIUM: MAGNESIUM: 1.8 mg/dL (ref 1.5–2.5)

## 2014-10-06 LAB — VITAMIN B12: VITAMIN B 12: 420 pg/mL (ref 211–911)

## 2014-10-13 ENCOUNTER — Ambulatory Visit (INDEPENDENT_AMBULATORY_CARE_PROVIDER_SITE_OTHER): Payer: Self-pay | Admitting: Surgery

## 2014-11-09 ENCOUNTER — Encounter (HOSPITAL_BASED_OUTPATIENT_CLINIC_OR_DEPARTMENT_OTHER): Payer: Self-pay | Admitting: *Deleted

## 2014-11-09 NOTE — Progress Notes (Signed)
To come in for ekg-bmet-saw dr Johnsie Cancel 10/15-doing well-last cath 06- To bring all meds cpap-and overnight bag He does not use the cpap, but wears a mouthpiece

## 2014-11-13 ENCOUNTER — Encounter (HOSPITAL_BASED_OUTPATIENT_CLINIC_OR_DEPARTMENT_OTHER)
Admission: RE | Admit: 2014-11-13 | Discharge: 2014-11-13 | Disposition: A | Discharge status: Home / Self Care | Payer: BLUE CROSS/BLUE SHIELD | Source: Ambulatory Visit | Attending: Surgery | Admitting: Surgery

## 2014-11-13 DIAGNOSIS — Z7982 Long term (current) use of aspirin: Secondary | ICD-10-CM | POA: Diagnosis not present

## 2014-11-13 DIAGNOSIS — F17200 Nicotine dependence, unspecified, uncomplicated: Secondary | ICD-10-CM | POA: Diagnosis not present

## 2014-11-13 DIAGNOSIS — I10 Essential (primary) hypertension: Secondary | ICD-10-CM | POA: Diagnosis not present

## 2014-11-13 DIAGNOSIS — E119 Type 2 diabetes mellitus without complications: Secondary | ICD-10-CM | POA: Diagnosis not present

## 2014-11-13 DIAGNOSIS — Z955 Presence of coronary angioplasty implant and graft: Secondary | ICD-10-CM | POA: Diagnosis not present

## 2014-11-13 DIAGNOSIS — K4090 Unilateral inguinal hernia, without obstruction or gangrene, not specified as recurrent: Secondary | ICD-10-CM | POA: Diagnosis present

## 2014-11-13 DIAGNOSIS — I2510 Atherosclerotic heart disease of native coronary artery without angina pectoris: Secondary | ICD-10-CM | POA: Diagnosis not present

## 2014-11-13 DIAGNOSIS — Z79899 Other long term (current) drug therapy: Secondary | ICD-10-CM | POA: Diagnosis not present

## 2014-11-13 DIAGNOSIS — K219 Gastro-esophageal reflux disease without esophagitis: Secondary | ICD-10-CM | POA: Diagnosis not present

## 2014-11-13 DIAGNOSIS — G4730 Sleep apnea, unspecified: Secondary | ICD-10-CM | POA: Diagnosis not present

## 2014-11-13 LAB — BASIC METABOLIC PANEL
Anion gap: 8 (ref 5–15)
BUN: 13 mg/dL (ref 6–23)
CO2: 25 mmol/L (ref 19–32)
CREATININE: 0.78 mg/dL (ref 0.50–1.35)
Calcium: 9.1 mg/dL (ref 8.4–10.5)
Chloride: 104 mmol/L (ref 96–112)
GFR calc Af Amer: >90 mL/min (ref >90)
GFR calc non Af Amer: >90 mL/min (ref >90)
GLUCOSE: 145 mg/dL — AB (ref 70–99)
Potassium: 4.6 mmol/L (ref 3.5–5.1)
Sodium: 137 mmol/L (ref 135–145)

## 2014-11-13 NOTE — Progress Notes (Signed)
ekg reviewed by dr Marin Comment, no changes from last ekg

## 2014-11-14 ENCOUNTER — Encounter (HOSPITAL_BASED_OUTPATIENT_CLINIC_OR_DEPARTMENT_OTHER): Payer: Self-pay | Admitting: Surgery

## 2014-11-14 NOTE — Progress Notes (Signed)
Quick Note:  These results are acceptable for scheduled surgery.  Evalie Hargraves M. Jeanni Allshouse, MD, FACS [facility] Office: [phone]   ______ 

## 2014-11-14 NOTE — H&P (Signed)
  General Surgery [facility]  Joe Green DOB: 09-Sep-1950 Language: Cleophus Molt / Race: White Male  History of Present Illness  Patient words: hernia.  The patient is a 64 year old male who presents with an inguinal hernia. Patient is referred by Dr. [name] for treatment of right inguinal hernia. Patient was diagnosed with right inguinal hernia several years ago. This has gradually increased in size. He has had intermittent mild discomfort. He denies any signs or symptoms of obstruction. It has always been reducible. He has had no prior abdominal surgery and no prior hernia repairs. Patient was recently seen by the physician assistant at his primary care physician's office. He was referred for repair of right inguinal hernia.  Allergies No Known Drug Allergies03/08/2014  Medication History  Aspirin EC (325MG  Tablet DR, Oral) Active. Vitamin D (Cholecalciferol) (1000UNIT Tablet, Oral) Active. Cardura XL (8MG  Tablet ER 24HR, Oral) Active. Glucophage (850MG  Tablet, Oral) Active. Altace (5MG  Capsule, Oral) Active. Crestor (20MG  Tablet, Oral) Active. Onglyza (5MG  Tablet, Oral) Active.  Vitals  Weight: 206.4 lb Height: 69in Body Surface Area: 2.13 m Body Mass Index: 30.48 kg/m Temp.: 97.24F(Oral)  Pulse: 77 (Regular)  BP: 136/78 (Sitting, Left Arm, Standard)    Physical Exam  General - appears comfortable, no distress; not diaphorectic  HEENT - normocephalic; sclerae clear, gaze conjugate; mucous membranes moist, dentition good; voice normal  Neck - symmetric on extension; no palpable anterior or posterior cervical adenopathy; no palpable masses in the thyroid bed  Chest - clear bilaterally with rhonchi, rales, or wheeze  Cor - regular rhythm with normal rate; no significant murmur  Abd - soft without distension; no sign of umbilical hernia; no surgical incisions  GU - normal male genitalia without mass or lesion;  obvious bulge in right groin; palpation reveals a moderate sized hernia sac which is mildly tender but easily reducible; left inguinal canal shows no evidence of hernia with cough and Valsalva.  Ext - non-tender without significant edema or lymphedema  Neuro - grossly intact; no tremor   Assessment & Plan   REDUCIBLE RIGHT INGUINAL HERNIA (550.90  K40.90)  The patient has a reducible right inguinal hernia which is mildly symptomatic. I have provided him with written literature on hernia repair to review at home.  I have recommended repair of right inguinal hernia with mesh as an outpatient surgical procedure. We discussed the risk and benefits. We discussed the possibility of recurrence. We discussed restrictions on his activities following the surgery. He understands and wishes to proceed in the near future. We will make arrangements for surgery at a time convenient for the patient.  The risks and benefits of the procedure have been discussed at length with the patient. The patient understands the proposed procedure, potential alternative treatments, and the course of recovery to be expected. All of the patient's questions have been answered at this time. The patient wishes to proceed with surgery.  Earnstine Regal, MD, [facility] Office: [phone]

## 2014-11-16 ENCOUNTER — Encounter (HOSPITAL_BASED_OUTPATIENT_CLINIC_OR_DEPARTMENT_OTHER)
Admission: RE | Disposition: A | Discharge status: Home / Self Care | Payer: Self-pay | Source: Ambulatory Visit | Attending: Surgery

## 2014-11-16 ENCOUNTER — Encounter (HOSPITAL_BASED_OUTPATIENT_CLINIC_OR_DEPARTMENT_OTHER): Payer: Self-pay | Admitting: Anesthesiology

## 2014-11-16 ENCOUNTER — Ambulatory Visit (HOSPITAL_BASED_OUTPATIENT_CLINIC_OR_DEPARTMENT_OTHER)
Admission: RE | Admit: 2014-11-16 | Discharge: 2014-11-17 | Disposition: A | Discharge status: Home / Self Care | Payer: BLUE CROSS/BLUE SHIELD | Source: Ambulatory Visit | Attending: Surgery | Admitting: Surgery

## 2014-11-16 ENCOUNTER — Ambulatory Visit (HOSPITAL_BASED_OUTPATIENT_CLINIC_OR_DEPARTMENT_OTHER): Payer: BLUE CROSS/BLUE SHIELD | Admitting: Anesthesiology

## 2014-11-16 DIAGNOSIS — Z7982 Long term (current) use of aspirin: Secondary | ICD-10-CM | POA: Insufficient documentation

## 2014-11-16 DIAGNOSIS — K4090 Unilateral inguinal hernia, without obstruction or gangrene, not specified as recurrent: Secondary | ICD-10-CM | POA: Diagnosis not present

## 2014-11-16 DIAGNOSIS — F17200 Nicotine dependence, unspecified, uncomplicated: Secondary | ICD-10-CM | POA: Insufficient documentation

## 2014-11-16 DIAGNOSIS — I2510 Atherosclerotic heart disease of native coronary artery without angina pectoris: Secondary | ICD-10-CM | POA: Insufficient documentation

## 2014-11-16 DIAGNOSIS — Z79899 Other long term (current) drug therapy: Secondary | ICD-10-CM | POA: Insufficient documentation

## 2014-11-16 DIAGNOSIS — E119 Type 2 diabetes mellitus without complications: Secondary | ICD-10-CM | POA: Insufficient documentation

## 2014-11-16 DIAGNOSIS — I10 Essential (primary) hypertension: Secondary | ICD-10-CM | POA: Insufficient documentation

## 2014-11-16 DIAGNOSIS — K219 Gastro-esophageal reflux disease without esophagitis: Secondary | ICD-10-CM | POA: Insufficient documentation

## 2014-11-16 DIAGNOSIS — Z955 Presence of coronary angioplasty implant and graft: Secondary | ICD-10-CM | POA: Insufficient documentation

## 2014-11-16 DIAGNOSIS — G4730 Sleep apnea, unspecified: Secondary | ICD-10-CM | POA: Insufficient documentation

## 2014-11-16 HISTORY — DX: Presence of spectacles and contact lenses: Z97.3

## 2014-11-16 HISTORY — PX: INGUINAL HERNIA REPAIR: SHX194

## 2014-11-16 HISTORY — PX: INSERTION OF MESH: SHX5868

## 2014-11-16 LAB — GLUCOSE, CAPILLARY
GLUCOSE-CAPILLARY: 156 mg/dL — AB (ref 70–99)
GLUCOSE-CAPILLARY: 161 mg/dL — AB (ref 70–99)

## 2014-11-16 SURGERY — REPAIR, HERNIA, INGUINAL, ADULT
Anesthesia: Regional | Site: Groin | Laterality: Right

## 2014-11-16 MED ORDER — BUPIVACAINE-EPINEPHRINE (PF) 0.5% -1:200000 IJ SOLN
INTRAMUSCULAR | Status: DC | PRN
  Administered 2014-11-16: 30 mL

## 2014-11-16 MED ORDER — RAMIPRIL 5 MG PO CAPS
5.0000 mg | ORAL_CAPSULE | Freq: Every day | ORAL | Status: DC
  Administered 2014-11-16: 5 mg via ORAL

## 2014-11-16 MED ORDER — OXYCODONE HCL 5 MG PO TABS: 5.0000 mg | ORAL_TABLET | ORAL | Status: DC | PRN

## 2014-11-16 MED ORDER — NON FORMULARY
5.0000 mg | Freq: Every day | Status: AC
  Administered 2014-11-16: 5 mg via ORAL

## 2014-11-16 MED ORDER — KCL IN DEXTROSE-NACL 20-5-0.45 MEQ/L-%-% IV SOLN
INTRAVENOUS | Status: DC
  Filled 2014-11-16: qty 1000

## 2014-11-16 MED ORDER — ONDANSETRON HCL 4 MG PO TABS: 4.0000 mg | ORAL_TABLET | Freq: Four times a day (QID) | ORAL | Status: DC | PRN

## 2014-11-16 MED ORDER — HYDROCODONE-ACETAMINOPHEN 5-325 MG PO TABS
1.0000 | ORAL_TABLET | ORAL | Status: DC | PRN
  Administered 2014-11-16: 1 via ORAL
  Administered 2014-11-16: 2 via ORAL
  Administered 2014-11-16: 1 via ORAL
  Administered 2014-11-17 (×2): 2 via ORAL
  Filled 2014-11-16: qty 1
  Filled 2014-11-16 (×3): qty 2

## 2014-11-16 MED ORDER — CEFAZOLIN SODIUM-DEXTROSE 2-3 GM-% IV SOLR
2.0000 g | INTRAVENOUS | Status: AC
  Administered 2014-11-16: 2 g via INTRAVENOUS

## 2014-11-16 MED ORDER — ROSUVASTATIN CALCIUM 20 MG PO TABS
20.0000 mg | ORAL_TABLET | Freq: Every day | ORAL | Status: DC
  Administered 2014-11-16: 20 mg via ORAL

## 2014-11-16 MED ORDER — METFORMIN HCL 850 MG PO TABS
850.0000 mg | ORAL_TABLET | Freq: Two times a day (BID) | ORAL | Status: DC
  Administered 2014-11-16: 850 mg via ORAL

## 2014-11-16 MED ORDER — DOXAZOSIN MESYLATE 8 MG PO TABS
8.0000 mg | ORAL_TABLET | Freq: Every evening | ORAL | Status: DC
  Administered 2014-11-16: 8 mg via ORAL

## 2014-11-16 MED ORDER — FENTANYL CITRATE 0.05 MG/ML IJ SOLN
INTRAMUSCULAR | Status: AC
  Filled 2014-11-16: qty 2

## 2014-11-16 MED ORDER — LACTATED RINGERS IV SOLN
INTRAVENOUS | Status: DC
  Administered 2014-11-16 (×2): via INTRAVENOUS

## 2014-11-16 MED ORDER — FENTANYL CITRATE 0.05 MG/ML IJ SOLN
INTRAMUSCULAR | Status: AC
  Filled 2014-11-16: qty 6

## 2014-11-16 MED ORDER — HYDROCODONE-ACETAMINOPHEN 5-325 MG PO TABS
ORAL_TABLET | ORAL | Status: AC
  Filled 2014-11-16: qty 1

## 2014-11-16 MED ORDER — FENTANYL CITRATE 0.05 MG/ML IJ SOLN
INTRAMUSCULAR | Status: DC | PRN
  Administered 2014-11-16: 25 ug via INTRAVENOUS
  Administered 2014-11-16 (×2): 50 ug via INTRAVENOUS
  Administered 2014-11-16: 25 ug via INTRAVENOUS
  Administered 2014-11-16: 50 ug via INTRAVENOUS

## 2014-11-16 MED ORDER — POTASSIUM CHLORIDE IN NACL 20-0.9 MEQ/L-% IV SOLN
INTRAVENOUS | Status: DC
  Administered 2014-11-16: 16:00:00 via INTRAVENOUS
  Filled 2014-11-16: qty 1000

## 2014-11-16 MED ORDER — ONDANSETRON HCL 4 MG/2ML IJ SOLN: 4.0000 mg | Freq: Four times a day (QID) | INTRAMUSCULAR | Status: DC | PRN

## 2014-11-16 MED ORDER — MIDAZOLAM HCL 2 MG/2ML IJ SOLN
INTRAMUSCULAR | Status: AC
  Filled 2014-11-16: qty 2

## 2014-11-16 MED ORDER — FENTANYL CITRATE 0.05 MG/ML IJ SOLN
50.0000 ug | INTRAMUSCULAR | Status: DC | PRN
  Administered 2014-11-16: 100 ug via INTRAVENOUS

## 2014-11-16 MED ORDER — MIDAZOLAM HCL 2 MG/2ML IJ SOLN
1.0000 mg | INTRAMUSCULAR | Status: DC | PRN
  Administered 2014-11-16: 2 mg via INTRAVENOUS

## 2014-11-16 MED ORDER — ACETAMINOPHEN 325 MG PO TABS: 650.0000 mg | ORAL_TABLET | ORAL | Status: DC | PRN

## 2014-11-16 MED ORDER — HYDROMORPHONE HCL 1 MG/ML IJ SOLN
0.2500 mg | INTRAMUSCULAR | Status: DC | PRN
Start: 2014-11-16 — End: 2014-11-17

## 2014-11-16 MED ORDER — BUPIVACAINE HCL (PF) 0.5 % IJ SOLN
INTRAMUSCULAR | Status: DC | PRN
  Administered 2014-11-16: 20 mL

## 2014-11-16 MED ORDER — HYDROMORPHONE HCL 1 MG/ML IJ SOLN: 1.0000 mg | INTRAMUSCULAR | Status: DC | PRN

## 2014-11-16 MED ORDER — BUPIVACAINE HCL (PF) 0.5 % IJ SOLN
INTRAMUSCULAR | Status: AC
  Filled 2014-11-16: qty 30

## 2014-11-16 MED ORDER — CEFAZOLIN SODIUM-DEXTROSE 2-3 GM-% IV SOLR
INTRAVENOUS | Status: AC
  Filled 2014-11-16: qty 50

## 2014-11-16 MED ORDER — MIDAZOLAM HCL 5 MG/5ML IJ SOLN
INTRAMUSCULAR | Status: DC | PRN
  Administered 2014-11-16: 2 mg via INTRAVENOUS

## 2014-11-16 MED ORDER — LIDOCAINE HCL (CARDIAC) 20 MG/ML IV SOLN
INTRAVENOUS | Status: DC | PRN
  Administered 2014-11-16: 50 mg via INTRAVENOUS

## 2014-11-16 MED ORDER — PROPOFOL 10 MG/ML IV BOLUS
INTRAVENOUS | Status: DC | PRN
  Administered 2014-11-16: 200 mg via INTRAVENOUS

## 2014-11-16 MED ORDER — ONDANSETRON HCL 4 MG/2ML IJ SOLN
INTRAMUSCULAR | Status: DC | PRN
  Administered 2014-11-16: 4 mg via INTRAVENOUS

## 2014-11-16 MED ORDER — DEXAMETHASONE SODIUM PHOSPHATE 4 MG/ML IJ SOLN
INTRAMUSCULAR | Status: DC | PRN
  Administered 2014-11-16: 10 mg via INTRAVENOUS

## 2014-11-16 SURGICAL SUPPLY — 44 items
APL SKNCLS STERI-STRIP NONHPOA (GAUZE/BANDAGES/DRESSINGS) ×1
BENZOIN TINCTURE PRP APPL 2/3 (GAUZE/BANDAGES/DRESSINGS) ×2 IMPLANT
BLADE CLIPPER SURG (BLADE) ×2 IMPLANT
BLADE SURG 15 STRL LF DISP TIS (BLADE) ×1 IMPLANT
BLADE SURG 15 STRL SS (BLADE) ×2
CANISTER SUCT 1200ML W/VALVE (MISCELLANEOUS) IMPLANT
CHLORAPREP W/TINT 26ML (MISCELLANEOUS) ×2 IMPLANT
CLEANER CAUTERY TIP 5X5 PAD (MISCELLANEOUS) ×1 IMPLANT
COVER BACK TABLE 60X90IN (DRAPES) ×2 IMPLANT
COVER MAYO STAND STRL (DRAPES) ×2 IMPLANT
DECANTER SPIKE VIAL GLASS SM (MISCELLANEOUS) IMPLANT
DRAIN PENROSE 1/2X12 LTX STRL (WOUND CARE) ×2 IMPLANT
DRAPE LAPAROTOMY 100X72 PEDS (DRAPES) ×2 IMPLANT
DRAPE UTILITY XL STRL (DRAPES) ×2 IMPLANT
ELECT REM PT RETURN 9FT ADLT (ELECTROSURGICAL) ×2
ELECTRODE REM PT RTRN 9FT ADLT (ELECTROSURGICAL) ×1 IMPLANT
GLOVE SURG ORTHO 8.0 STRL STRW (GLOVE) ×2 IMPLANT
GLOVE SURG SS PI 7.0 STRL IVOR (GLOVE) ×2 IMPLANT
GOWN STRL REUS W/ TWL LRG LVL3 (GOWN DISPOSABLE) ×1 IMPLANT
GOWN STRL REUS W/ TWL XL LVL3 (GOWN DISPOSABLE) ×1 IMPLANT
GOWN STRL REUS W/TWL LRG LVL3 (GOWN DISPOSABLE) ×2
GOWN STRL REUS W/TWL XL LVL3 (GOWN DISPOSABLE) ×2
MESH ULTRAPRO 3X6 7.6X15CM (Mesh General) ×1 IMPLANT
NDL HYPO 25X1 1.5 SAFETY (NEEDLE) ×1 IMPLANT
NEEDLE HYPO 25X1 1.5 SAFETY (NEEDLE) ×2 IMPLANT
NS IRRIG 1000ML POUR BTL (IV SOLUTION) ×2 IMPLANT
PACK BASIN DAY SURGERY FS (CUSTOM PROCEDURE TRAY) ×2 IMPLANT
PAD CLEANER CAUTERY TIP 5X5 (MISCELLANEOUS) ×1
PENCIL BUTTON HOLSTER BLD 10FT (ELECTRODE) ×2 IMPLANT
SLEEVE SCD COMPRESS KNEE MED (MISCELLANEOUS) ×1 IMPLANT
SPONGE GAUZE 4X4 12PLY STER LF (GAUZE/BANDAGES/DRESSINGS) ×1 IMPLANT
STRIP CLOSURE SKIN 1/2X4 (GAUZE/BANDAGES/DRESSINGS) ×2 IMPLANT
SUT MNCRL AB 4-0 PS2 18 (SUTURE) ×2 IMPLANT
SUT NOVA NAB GS-22 2 0 T19 (SUTURE) ×4 IMPLANT
SUT PROLENE 0 CT 2 (SUTURE) ×1 IMPLANT
SUT SILK 2 0 SH (SUTURE) ×2 IMPLANT
SUT SILK 2 0 TIES 17X18 (SUTURE)
SUT SILK 2-0 18XBRD TIE BLK (SUTURE) IMPLANT
SUT VICRYL 3-0 CR8 SH (SUTURE) ×2 IMPLANT
SYR CONTROL 10ML LL (SYRINGE) ×2 IMPLANT
TOWEL OR 17X24 6PK STRL BLUE (TOWEL DISPOSABLE) ×5 IMPLANT
TOWEL OR NON WOVEN STRL DISP B (DISPOSABLE) ×1 IMPLANT
TUBE CONNECTING 20X1/4 (TUBING) IMPLANT
YANKAUER SUCT BULB TIP NO VENT (SUCTIONS) IMPLANT

## 2014-11-16 NOTE — Anesthesia Procedure Notes (Addendum)
Anesthesia Regional Block:  TAP block  Pre-Anesthetic Checklist: ,, timeout performed, Correct Patient, Correct Site, Correct Laterality, Correct Procedure, Correct Position, site marked, Risks and benefits discussed, pre-op evaluation, post-op pain management  Laterality: Right  Prep: Maximum Sterile Barrier Precautions used and chloraprep       Needles:  Injection technique: Single-shot  Needle Type: Echogenic Stimulator Needle     Needle Length: 9cm 9 cm Needle Gauge: 21 and 21 G    Additional Needles:  Procedures: ultrasound guided (picture in chart) TAP block Narrative:  Start time: 11/16/2014 11:24 AM End time: 11/16/2014 11:32 AM Injection made incrementally with aspirations every 5 mL. Anesthesiologist: Roderic Palau  Additional Notes: 2% Lidocaine skin wheel.   Procedure Name: LMA Insertion Date/Time: 11/16/2014 12:14 PM Performed by: Toula Moos L Pre-anesthesia Checklist: Patient identified, Emergency Drugs available, Suction available, Patient being monitored and Timeout performed Patient Re-evaluated:Patient Re-evaluated prior to inductionOxygen Delivery Method: Circle System Utilized Preoxygenation: Pre-oxygenation with 100% oxygen Intubation Type: IV induction Ventilation: Mask ventilation without difficulty LMA: LMA inserted LMA Size: 5.0 Number of attempts: 1 Airway Equipment and Method: Bite block Placement Confirmation: positive ETCO2 Tube secured with: Tape Dental Injury: Teeth and Oropharynx as per pre-operative assessment

## 2014-11-16 NOTE — Progress Notes (Signed)
Assisted Dr. Oren Bracket with right, ultrasound guided, transabdominal plane block. Side rails up, monitors on throughout procedure. See vital signs in flow sheet. Tolerated Procedure well.

## 2014-11-16 NOTE — Anesthesia Preprocedure Evaluation (Addendum)
Anesthesia Evaluation  Patient identified by MRN, date of birth, ID band Patient awake    Reviewed: Allergy & Precautions, H&P , NPO status , Patient's Chart, lab work & pertinent test results  Airway Mallampati: II  TM Distance: >3 FB Neck ROM: Full    Dental no notable dental hx. (+) Teeth Intact, Dental Advisory Given   Pulmonary sleep apnea , Current Smoker,  breath sounds clear to auscultation  Pulmonary exam normal       Cardiovascular hypertension, Pt. on medications + CAD and + Cardiac Stents Rhythm:Regular Rate:Normal     Neuro/Psych negative neurological ROS  negative psych ROS   GI/Hepatic negative GI ROS, Neg liver ROS,   Endo/Other  negative endocrine ROSdiabetes, Type 2, Oral Hypoglycemic Agents  Renal/GU negative Renal ROS  negative genitourinary   Musculoskeletal   Abdominal   Peds  Hematology negative hematology ROS (+)   Anesthesia Other Findings   Reproductive/Obstetrics negative OB ROS                           Anesthesia Physical Anesthesia Plan  ASA: III  Anesthesia Plan: General and Regional   Post-op Pain Management:    Induction: Intravenous  Airway Management Planned: LMA  Additional Equipment:   Intra-op Plan:   Post-operative Plan:   Informed Consent: I have reviewed the patients History and Physical, chart, labs and discussed the procedure including the risks, benefits and alternatives for the proposed anesthesia with the patient or authorized representative who has indicated his/her understanding and acceptance.   Dental advisory given  Plan Discussed with: CRNA  Anesthesia Plan Comments:         Anesthesia Quick Evaluation

## 2014-11-16 NOTE — Interval H&P Note (Signed)
History and Physical Interval Note:  11/16/2014 11:58 AM  Joe Green  has presented today for surgery, with the diagnosis of RIGHT INGUINAL HERNIA.  The various methods of treatment have been discussed with the patient and family. After consideration of risks, benefits and other options for treatment, the patient has consented to    Procedure(s): RIGHT INGUINAL HERNIA REPAIR WITH MESH (Right) INSERTION OF MESH (Right) as a surgical intervention .    The patient's history has been reviewed, patient examined, no change in status, stable for surgery.  I have reviewed the patient's chart and labs.  Questions were answered to the patient's satisfaction.    Earnstine Regal, MD, [facility] Office: [place]

## 2014-11-16 NOTE — Op Note (Signed)
Inguinal Hernia, Open, Procedure Note  Pre-operative Diagnosis:  Right inguinal hernia, reducible  Post-operative Diagnosis: same  Surgeon:  Earnstine Regal, MD, FACS  Anesthesia:  General  Preparation:  Chlora-prep  Estimated Blood Loss: Minimal  Complications:  none  Indications: The patient is a 64 year old male who presents with an inguinal hernia. Patient is referred by Dr. [name] for treatment of right inguinal hernia. Patient was diagnosed with right inguinal hernia several years ago. This has gradually increased in size. He has had intermittent mild discomfort. He denies any signs or symptoms of obstruction. It has always been reducible. He has had no prior abdominal surgery and no prior hernia repairs. Patient was recently seen by the physician assistant at his primary care physician's office. He was referred for repair of right inguinal hernia.   Procedure Details  The patient was evaluated in the holding area. All of the patient's questions were answered and the proposed procedure was confirmed. The site of the procedure was properly marked. The patient was taken to the Operating Room, identified by name, and the procedure verified as inguinal hernia repair.  The patient was placed in the supine position and underwent induction of anesthesia. A "Time Out" was performed per routine. The lower abdomen and groin were prepped and draped in the usual aseptic fashion.  After ascertaining that an adequate level of anesthesia had been obtained, an incision was made in the groin with a #10 blade.  Dissection was carried through the subcutaneous tissues and hemostasis obtained with the electrocautery.  A Gelpi retractor was placed for exposure.  The external oblique fascia was incised in line with it's fibers and extended through the external inguinal ring.  The cord structures were dissected out of the inguinal canal and encircled with a Penrose drain.  The floor of the  inguinal canal was dissected out.  There was a small direct inguinal hernia present and it was easily reduced.  The cord was explored and a moderate indirect hernia was present.  It was reduced and the internal ring tightened with interrupted 0-Prolene sutures.  The floor of the inguinal canal was reconstructed with Ethicon Ultrapro mesh cut to the appropriate dimensions.  It was secured to the pubic tubercle with a 2-0 Novafil suture and along the inguinal ligament with a running 2-0 Novafil suture.  Mesh was split to accommodate the cord structures.  The superior margin of the mesh was secured to the transversalis and internal oblique musculature with interrupted 2-0 Novafil sutures.  The tails of the mesh were overlapped lateral to the cord structures and secured to the inguinal ligament with interrupted 2-0 Novafil sutures to recreate the internal inguinal ring.  Cord structures were returned to the inguinal canal.  Local anesthetic was infiltrated throughout the field.  External oblique fascia was closed with interrupted 3-0 Vicryl sutures.  Subcutaneous tissues were closed with interrupted 3-0 Vicryl sutures.  Skin was anesthetized with local anesthetic, and the skin edges were re-approximated with a running 4-0 Monocryl suture.  Wound was washed and dried and benzoin and steristrips were applied.  A gauze dressing was applied.  Instrument, sponge, and needle counts were correct prior to closure and at the conclusion of the case.  The patient tolerated the procedure well.  The patient was awakened from anesthesia and brought to the recovery room in stable condition.  Earnstine Regal, MD, [facility] Office: [phone]

## 2014-11-16 NOTE — Anesthesia Postprocedure Evaluation (Signed)
  Anesthesia Post-op Note  Patient: Joe Green  Procedure(s) Performed: Procedure(s): RIGHT INGUINAL HERNIA REPAIR WITH MESH (Right) INSERTION OF MESH (Right)  Patient Location: PACU  Anesthesia Type:General and block  Level of Consciousness: awake and alert   Airway and Oxygen Therapy: Patient Spontanous Breathing  Post-op Pain: mild  Post-op Assessment: Post-op Vital signs reviewed, Patient's Cardiovascular Status Stable and Respiratory Function Stable  Post-op Vital Signs: Reviewed  Filed Vitals:   11/16/14 1426  BP:   Pulse: 71  Temp:   Resp: 20    Complications: No apparent anesthesia complications

## 2014-11-16 NOTE — Transfer of Care (Signed)
Immediate Anesthesia Transfer of Care Note  Patient: Joe Green  Procedure(s) Performed: Procedure(s): RIGHT INGUINAL HERNIA REPAIR WITH MESH (Right) INSERTION OF MESH (Right)  Patient Location: PACU  Anesthesia Type:GA combined with regional for post-op pain  Level of Consciousness: awake and patient cooperative  Airway & Oxygen Therapy: Patient Spontanous Breathing and Patient connected to face mask oxygen  Post-op Assessment: Report given to RN and Post -op Vital signs reviewed and stable  Post vital signs: Reviewed and stable  Last Vitals:  Filed Vitals:   11/16/14 1140  BP: 127/60  Pulse: 83  Temp:   Resp: 97    Complications: No apparent anesthesia complications

## 2014-11-17 DIAGNOSIS — K4090 Unilateral inguinal hernia, without obstruction or gangrene, not specified as recurrent: Secondary | ICD-10-CM | POA: Diagnosis not present

## 2014-11-17 NOTE — Discharge Instructions (Signed)
[facility]  HERNIA REPAIR POST OP INSTRUCTIONS  Always review your discharge instruction sheet given to you by the facility where your surgery was performed.  1. A  prescription for pain medication may be given to you upon discharge.  Take your pain medication as prescribed.  If narcotic pain medicine is not needed, then you may take acetaminophen (Tylenol) or ibuprofen (Advil) as needed.  2. Take your usually prescribed medications unless otherwise directed.  3. If you need a refill on your pain medication, please contact your pharmacy.  They will contact our office to request authorization. Prescriptions will not be filled after 5 pm daily or on weekends.  4. You should follow a light diet the first 24 hours after arrival home, such as soup and crackers or toast.  Be sure to include plenty of fluids daily.  Resume your normal diet the day after surgery.  5. Most patients will experience some swelling and bruising around the surgical site.  Ice packs and reclining will help.  Swelling and bruising can take several days to resolve.   6. It is common to experience some constipation if taking pain medication after surgery.  Increasing fluid intake and taking a stool softener (such as Colace) will usually help or prevent this problem from occurring.  A mild laxative (Milk of Magnesia or Miralax) should be taken according to package directions if there are no bowel movements after 48 hours.  7. Unless discharge instructions indicate otherwise, you may remove your bandages 24-48 hours after surgery, and you may shower at that time.  You may have steri-strips (small skin tapes) in place directly over the incision.  These strips should be left on the skin for 7-10 days.  If your surgeon used skin glue on the incision, you may shower in 24 hours.  The glue will flake off over the next 2-3 weeks.  Any sutures or staples will be removed at the office during your follow-up  visit.  8. ACTIVITIES:  You may resume regular (light) daily activities beginning the next day-such as daily self-care, walking, climbing stairs-gradually increasing activities as tolerated.  You may have sexual intercourse when it is comfortable.  Refrain from any heavy lifting or straining until approved by your doctor.  You may drive when you are no longer taking prescription pain medication, you can comfortably wear a seatbelt, and you can safely maneuver your car and apply brakes.  9. You should see your doctor in the office for a follow-up appointment approximately 2-3 weeks after your surgery.  Make sure that you call for this appointment within a day or two after you arrive home to insure a convenient appointment time. 10.   WHEN TO CALL YOUR DOCTOR: 1. Fever greater than 101.0 2. Inability to urinate 3. Persistent nausea and/or vomiting 4. Extreme swelling or bruising 5. Continued bleeding from incision 6. Increased pain, redness, or drainage from the incision  The clinic staff is available to answer your questions during regular business hours.  Please don't hesitate to call and ask to speak to one of the nurses for clinical concerns.  If you have a medical emergency, go to the nearest emergency room or call 911.  A surgeon from [facility] is always on call for the hospital.   [facility] [address], [address], [place], [place]  [address]  [phone] ? [phone] ? FAX [phone]  [email or web address]   

## 2014-11-18 ENCOUNTER — Encounter (HOSPITAL_BASED_OUTPATIENT_CLINIC_OR_DEPARTMENT_OTHER): Payer: Self-pay | Admitting: Surgery

## 2014-12-11 ENCOUNTER — Other Ambulatory Visit: Payer: Self-pay | Admitting: Physician Assistant

## 2014-12-22 ENCOUNTER — Encounter: Payer: Self-pay | Admitting: Gastroenterology

## 2014-12-29 ENCOUNTER — Other Ambulatory Visit: Payer: Self-pay | Admitting: Internal Medicine

## 2015-01-23 ENCOUNTER — Other Ambulatory Visit: Payer: Self-pay | Admitting: Physician Assistant

## 2015-01-23 ENCOUNTER — Other Ambulatory Visit: Payer: Self-pay | Admitting: Internal Medicine

## 2015-01-24 ENCOUNTER — Other Ambulatory Visit: Payer: Self-pay | Admitting: Internal Medicine

## 2015-01-25 ENCOUNTER — Other Ambulatory Visit: Payer: Self-pay | Admitting: *Deleted

## 2015-01-25 MED ORDER — METFORMIN HCL 850 MG PO TABS: 850.0000 mg | ORAL_TABLET | Freq: Two times a day (BID) | ORAL | Status: DC

## 2015-01-25 MED ORDER — RAMIPRIL 5 MG PO CAPS: 5.0000 mg | ORAL_CAPSULE | Freq: Every day | ORAL | Status: DC

## 2015-01-25 MED ORDER — DOXAZOSIN MESYLATE 8 MG PO TABS: 8.0000 mg | ORAL_TABLET | Freq: Every day | ORAL | Status: DC

## 2015-01-27 ENCOUNTER — Telehealth: Payer: Self-pay

## 2015-01-27 NOTE — Telephone Encounter (Signed)
Called pt to sched f/u OV. Per pt's spouse, pt fell 01-26-15 and broke his leg and is unable to drive. Pt's spouse is recovering from surgery and is also unable to drive. Pt will call to sched f/u when they are able to drive.

## 2015-04-01 ENCOUNTER — Telehealth: Payer: Self-pay | Admitting: Cardiovascular Disease

## 2015-04-01 ENCOUNTER — Other Ambulatory Visit: Payer: Self-pay | Admitting: *Deleted

## 2015-04-01 NOTE — Telephone Encounter (Signed)
PT  CALLING IS HAVING  MRI DONE TOMORROW  ON SHOULDER  IS THIS OKAY  WITH HAVING  STENT ? WILL DISCUSS WITH DR  Johnsie Cancel .

## 2015-04-01 NOTE — Telephone Encounter (Signed)
Yes he is fine to have MRI

## 2015-04-01 NOTE — Telephone Encounter (Signed)
VOICE MAIL LEFT THAT  PT MAY  PROCEED WITH  MRI   PER  DR Johnsie Cancel .Adonis Housekeeper

## 2015-04-01 NOTE — Telephone Encounter (Signed)
New Message        Pt calling stating that he has an MRI tomorrow and needs to know if the stint he had put in will be ok for him to get the MRI. Please call back and advise.

## 2015-05-06 ENCOUNTER — Ambulatory Visit (INDEPENDENT_AMBULATORY_CARE_PROVIDER_SITE_OTHER): Payer: BLUE CROSS/BLUE SHIELD | Admitting: Physician Assistant

## 2015-05-06 ENCOUNTER — Encounter: Payer: Self-pay | Admitting: Physician Assistant

## 2015-05-06 VITALS — BP 126/66 | HR 72 | Temp 97.5°F | Resp 16 | Ht 69.0 in | Wt 194.8 lb

## 2015-05-06 DIAGNOSIS — I10 Essential (primary) hypertension: Secondary | ICD-10-CM | POA: Diagnosis not present

## 2015-05-06 DIAGNOSIS — R972 Elevated prostate specific antigen [PSA]: Secondary | ICD-10-CM

## 2015-05-06 DIAGNOSIS — IMO0001 Reserved for inherently not codable concepts without codable children: Secondary | ICD-10-CM

## 2015-05-06 DIAGNOSIS — Z79899 Other long term (current) drug therapy: Secondary | ICD-10-CM | POA: Diagnosis not present

## 2015-05-06 DIAGNOSIS — E559 Vitamin D deficiency, unspecified: Secondary | ICD-10-CM

## 2015-05-06 DIAGNOSIS — E119 Type 2 diabetes mellitus without complications: Secondary | ICD-10-CM

## 2015-05-06 DIAGNOSIS — E782 Mixed hyperlipidemia: Secondary | ICD-10-CM

## 2015-05-06 DIAGNOSIS — Z720 Tobacco use: Secondary | ICD-10-CM | POA: Diagnosis not present

## 2015-05-06 DIAGNOSIS — F17200 Nicotine dependence, unspecified, uncomplicated: Secondary | ICD-10-CM

## 2015-05-06 DIAGNOSIS — I2583 Coronary atherosclerosis due to lipid rich plaque: Secondary | ICD-10-CM

## 2015-05-06 DIAGNOSIS — I2510 Atherosclerotic heart disease of native coronary artery without angina pectoris: Secondary | ICD-10-CM

## 2015-05-06 LAB — HEMOGLOBIN A1C
Hgb A1c MFr Bld: 8.6 % — ABNORMAL HIGH (ref <5.7)
Mean Plasma Glucose: 200 mg/dL — ABNORMAL HIGH (ref <117)

## 2015-05-06 MED ORDER — METFORMIN HCL 1000 MG PO TABS: 1000.0000 mg | ORAL_TABLET | Freq: Two times a day (BID) | ORAL | Status: DC

## 2015-05-06 NOTE — Patient Instructions (Signed)
Diabetes is a very complicated disease...lets simplify it.  An easy way to look at it to understand the complications is if you think of the extra sugar floating in your blood stream as glass shards floating through your blood stream.    Diabetes affects your small vessels first: 1) The glass shards (sugar) scraps down the tiny blood vessels in your eyes and lead to diabetic retinopathy, the leading cause of blindness in the [place]. Diabetes is the leading cause of newly diagnosed adult (20 to 63 years of age) blindness in the [place].  2) The glass shards scratches down the tiny vessels of your legs leading to nerve damage called neuropathy and can lead to amputations of your feet. More than 60% of all non-traumatic amputations of lower limbs occur in people with diabetes.  3) Over time the small vessels in your brain are shredded and closed off, individually this does not cause any problems but over a long period of time many of the small vessels being blocked can lead to Vascular Dementia.   4) Your kidney's are a filter system and have a "net" that keeps certain things in the body and lets bad things out. Sugar shreds this net and leads to kidney damage and eventually failure. Decreasing the sugar that is destroying the net and certain blood pressure medications can help stop or decrease progression of kidney disease. Diabetes was the primary cause of kidney failure in 44 percent of all new cases in 2011.  5) Diabetes also destroys the small vessels in your penis that lead to erectile dysfunction. Eventually the vessels are so damaged that you may not be responsive to cialis or viagra.   Diabetes and your large vessels: Your larger vessels consist of your coronary arteries in your heart and the carotid vessels to your brain. Diabetes or even increased sugars put you at 300% increased risk of heart attack and stroke and this is why.. The sugar scrapes down your large blood vessels and your body  sees this as an internal injury and tries to repair itself. Just like you get a scab on your skin, your platelets will stick to the blood vessel wall trying to heal it. This is why we have diabetics on low dose aspirin daily, this prevents the platelets from sticking and can prevent plaque formation. In addition, your body takes cholesterol and tries to shove it into the open wound. This is why we want your LDL, or bad cholesterol, below 70.   The combination of platelets and cholesterol over 5-10 years forms plaque that can break off and cause a heart attack or stroke.   PLEASE REMEMBER:  Diabetes is preventable! Up to 85 percent of complications and morbidities among individuals with type 2 diabetes can be prevented, delayed, or effectively treated and minimized with regular visits to a health professional, appropriate monitoring and medication, and a healthy diet and lifestyle.     Bad carbs also include fruit juice, alcohol, and sweet tea. These are empty calories that do not signal to your brain that you are full.   Please remember the good carbs are still carbs which convert into sugar. So please measure them out no more than 1/2-1 cup of rice, oatmeal, pasta, and beans  Veggies are however free foods! Pile them on.   Not all fruit is created equal. Please see the list below, the fruit at the bottom is higher in sugars than the fruit at the top. Please avoid all dried fruits.     

## 2015-05-06 NOTE — Progress Notes (Signed)
Assessment and Plan:  Hypertension: Continue medication, monitor blood pressure at home. Continue DASH diet. Cholesterol: Continue diet and exercise. Check cholesterol.  Diabetes-Continue diet and exercise. Check A1C, increase metformin to 1000mg  BID, continue onglyza, stop the soda.  CAD- discussed smoking cessation, using vape, discussed need for better control of sugar, HTN, and chol Vitamin D Def- check level and continue medications.  Smoking cessation discussed Elevated PSA- recheck  Continue diet and meds as discussed. Further disposition pending results of labs. Discussed med's effects and SE's.    HPI 64 y.o. male  presents for 3 month follow up with hypertension, hyperlipidemia, diabetes and vitamin D. His blood pressure has been controlled at home, today their BP is BP: 126/66 mmHg He does not workout but he does yard work and stays busy. He denies chest pain, shortness of breath, dizziness.  He is on cholesterol medication, crestor and denies myalgias. His cholesterol is at goal. The cholesterol last visit was:   Lab Results  Component Value Date   CHOL 119 10/05/2014   HDL 44 10/05/2014   LDLCALC 52 10/05/2014   TRIG 116 10/05/2014   CHOLHDL 2.7 10/05/2014   He has been working on diet and exercise for Diabetes, he has freestyle machine and checks sugars occ, in AM runs in the 160's, he is on onglyza and metformin 850 BID, he is on ACE, and he is on bASA, and denies paresthesia of the feet, polydipsia and polyuria. Last A1C in the office was:  Lab Results  Component Value Date   HGBA1C 8.0* 10/05/2014   Patient is on Vitamin D supplement.   Lab Results  Component Value Date   VD25OH 13 10/05/2014   He broke his right leg at work and was out of work for 2 month. He is doing better, mild swelling in that leg, no pain, has follow up.  Had right hernia repair.  Last PSA values was elevated with normal urine. Will recheck today.  Lab Results  Component Value Date   PSA  3.60 10/05/2014   PSA 3.84 01/02/2014   PSA 4.09* 10/03/2013    Current Medications:  Current Outpatient Prescriptions on File Prior to Visit  Medication Sig Dispense Refill  . aspirin 325 MG EC tablet Take 325 mg by mouth daily.      . Cholecalciferol (VITAMIN D PO) Take 5,000 mg by mouth daily.    . CRESTOR 20 MG tablet TAKE ONE TABLET BY MOUTH ONCE DAILY 30 tablet 3  . doxazosin (CARDURA) 8 MG tablet Take 1 tablet (8 mg total) by mouth daily. 90 tablet 0  . metFORMIN (GLUCOPHAGE) 850 MG tablet Take 1 tablet (850 mg total) by mouth 2 (two) times daily. 60 tablet 0  . ONGLYZA 5 MG TABS tablet TAKE ONE TABLET BY MOUTH ONCE DAILY 30 tablet 3  . ramipril (ALTACE) 5 MG capsule Take 1 capsule (5 mg total) by mouth daily. 90 capsule 0  . rosuvastatin (CRESTOR) 20 MG tablet take Monday, Wednesday and Friday.     No current facility-administered medications on file prior to visit.   Medical History:  Past Medical History  Diagnosis Date  . CAD (coronary artery disease)     a. s/p stent to RCA 2005; b. LHC 2006 after Nuc with inf ischemia: patent RCA stent, D1 80%, LAD 60% => med Rx;  c. LHC 04/18/11: Proximal LAD 60% (unchanged from previous catheterization), distal LAD 65% (small caliber), Dx 60%, proximal CFX 20%, mid CFX 50%, OM1 30%, proximal  RCA 20%, mid RCA stent patent, EF 65%.=> med Rx  . HLD (hyperlipidemia)   . Hypertension   . Type II or unspecified type diabetes mellitus without mention of complication, not stated as uncontrolled   . Obesity   . GERD (gastroesophageal reflux disease)   . Vitamin D deficiency   . Wears glasses   . OSA (obstructive sleep apnea)     has a cpap-does not use-has a special mouthpiece he wears at night   Allergies: No Known Allergies   Review of Systems  Constitutional: Negative.   HENT: Negative.   Respiratory: Positive for cough. Negative for hemoptysis, sputum production, shortness of breath and wheezing.   Cardiovascular: Negative.  Negative  for chest pain and claudication.  Gastrointestinal: Negative for heartburn, nausea, vomiting, abdominal pain, diarrhea, constipation, blood in stool and melena.  Genitourinary: Negative.   Musculoskeletal: Positive for myalgias ( RLS) and back pain. Negative for joint pain, falls and neck pain.  Skin: Negative.   Neurological: Negative.  Negative for dizziness.  Psychiatric/Behavioral: Negative.     Family history- Review and unchanged Social history- Review and unchanged Physical Exam: BP 126/66 mmHg  Pulse 72  Temp(Src) 97.5 F (36.4 C)  Resp 16  Ht 5\' 9"  (1.753 m)  Wt 194 lb 12.8 oz (88.361 kg)  BMI 28.75 kg/m2 Wt Readings from Last 3 Encounters:  05/06/15 194 lb 12.8 oz (88.361 kg)  11/16/14 197 lb (89.359 kg)  10/05/14 203 lb (92.08 kg)   General Appearance: Well nourished, in no apparent distress. Eyes: PERRLA, EOMs, conjunctiva no swelling or erythema Sinuses: No Frontal/maxillary tenderness ENT/Mouth: Ext aud canals clear, TMs without erythema, bulging. No erythema, swelling, or exudate on post pharynx.  Tonsils not swollen or erythematous. Hearing normal.  Neck: Supple, thyroid normal.  Respiratory: Respiratory effort normal, BS equal bilaterally without rales, rhonchi, wheezing or stridor.  Cardio: RRR with no MRGs. Brisk peripheral pulses with mild edema right leg great than left without warmth, erythema, tenderness. .  Abdomen: Soft, + BS.  Non tender, no guarding, rebound, hernias, masses. Lymphatics: Non tender without lymphadenopathy.  Musculoskeletal: Full ROM, 5/5 strength, normal gait.  Skin: Warm, dry without rashes, lesions, ecchymosis.  Neuro: Cranial nerves intact. No cerebellar symptoms. Sensation intact.  Psych: Awake and oriented X 3, normal affect, Insight and Judgment appropriate.    Vicie Mutters 9:56 AM

## 2015-05-07 LAB — HEPATIC FUNCTION PANEL
ALT: 23 U/L (ref 9–46)
AST: 14 U/L (ref 10–35)
Albumin: 4.2 g/dL (ref 3.6–5.1)
Alkaline Phosphatase: 99 U/L (ref 40–115)
BILIRUBIN INDIRECT: 0.6 mg/dL (ref 0.2–1.2)
Bilirubin, Direct: 0.2 mg/dL (ref <=0.2)
Total Bilirubin: 0.8 mg/dL (ref 0.2–1.2)
Total Protein: 6.6 g/dL (ref 6.1–8.1)

## 2015-05-07 LAB — BASIC METABOLIC PANEL WITH GFR
BUN: 14 mg/dL (ref 7–25)
CO2: 25 mmol/L (ref 20–31)
Calcium: 9.3 mg/dL (ref 8.6–10.3)
Chloride: 101 mmol/L (ref 98–110)
Creat: 0.75 mg/dL (ref 0.70–1.25)
GFR, Est Non African American: >89 mL/min (ref >=60)
GLUCOSE: 249 mg/dL — AB (ref 65–99)
POTASSIUM: 4.4 mmol/L (ref 3.5–5.3)
Sodium: 138 mmol/L (ref 135–146)

## 2015-05-07 LAB — CBC WITH DIFFERENTIAL/PLATELET
BASOS ABS: 0 10*3/uL (ref 0.0–0.1)
Basophils Relative: 0 % (ref 0–1)
EOS PCT: 2 % (ref 0–5)
Eosinophils Absolute: 0.2 10*3/uL (ref 0.0–0.7)
HEMATOCRIT: 45.2 % (ref 39.0–52.0)
Hemoglobin: 15.6 g/dL (ref 13.0–17.0)
LYMPHS PCT: 18 % (ref 12–46)
Lymphs Abs: 2.2 10*3/uL (ref 0.7–4.0)
MCH: 32.1 pg (ref 26.0–34.0)
MCHC: 34.5 g/dL (ref 30.0–36.0)
MCV: 93 fL (ref 78.0–100.0)
MPV: 11.7 fL (ref 8.6–12.4)
Monocytes Absolute: 0.9 10*3/uL (ref 0.1–1.0)
Monocytes Relative: 7 % (ref 3–12)
Neutro Abs: 9.1 10*3/uL — ABNORMAL HIGH (ref 1.7–7.7)
Neutrophils Relative %: 73 % (ref 43–77)
Platelets: 157 10*3/uL (ref 150–400)
RBC: 4.86 MIL/uL (ref 4.22–5.81)
RDW: 13.3 % (ref 11.5–15.5)
WBC: 12.4 10*3/uL — AB (ref 4.0–10.5)

## 2015-05-07 LAB — LIPID PANEL
CHOL/HDL RATIO: 2.9 ratio (ref <=5.0)
Cholesterol: 111 mg/dL — ABNORMAL LOW (ref 125–200)
HDL: 38 mg/dL — AB (ref >=40)
LDL CALC: 57 mg/dL (ref <130)
TRIGLYCERIDES: 81 mg/dL (ref <150)
VLDL: 16 mg/dL (ref <30)

## 2015-05-07 LAB — TSH: TSH: 1.477 u[IU]/mL (ref 0.350–4.500)

## 2015-05-07 LAB — MAGNESIUM: Magnesium: 1.7 mg/dL (ref 1.5–2.5)

## 2015-05-07 LAB — PSA: PSA: 3.58 ng/mL (ref <=4.00)

## 2015-05-07 LAB — VITAMIN D 25 HYDROXY (VIT D DEFICIENCY, FRACTURES): Vit D, 25-Hydroxy: 66 ng/mL (ref 30–100)

## 2015-05-07 LAB — INSULIN, FASTING: INSULIN FASTING, SERUM: 8 u[IU]/mL (ref 2.0–19.6)

## 2015-05-08 ENCOUNTER — Other Ambulatory Visit: Payer: Self-pay | Admitting: Internal Medicine

## 2015-07-31 ENCOUNTER — Other Ambulatory Visit: Payer: Self-pay | Admitting: Internal Medicine

## 2015-07-31 DIAGNOSIS — N320 Bladder-neck obstruction: Secondary | ICD-10-CM

## 2015-08-10 ENCOUNTER — Encounter: Payer: Self-pay | Admitting: Internal Medicine

## 2015-08-10 ENCOUNTER — Ambulatory Visit (INDEPENDENT_AMBULATORY_CARE_PROVIDER_SITE_OTHER): Payer: BLUE CROSS/BLUE SHIELD | Admitting: Internal Medicine

## 2015-08-10 VITALS — BP 110/60 | HR 72 | Temp 98.0°F | Resp 16 | Ht 69.0 in | Wt 200.0 lb

## 2015-08-10 DIAGNOSIS — Z79899 Other long term (current) drug therapy: Secondary | ICD-10-CM

## 2015-08-10 DIAGNOSIS — I10 Essential (primary) hypertension: Secondary | ICD-10-CM | POA: Diagnosis not present

## 2015-08-10 DIAGNOSIS — E559 Vitamin D deficiency, unspecified: Secondary | ICD-10-CM

## 2015-08-10 DIAGNOSIS — E119 Type 2 diabetes mellitus without complications: Secondary | ICD-10-CM

## 2015-08-10 DIAGNOSIS — E782 Mixed hyperlipidemia: Secondary | ICD-10-CM

## 2015-08-10 LAB — HEPATIC FUNCTION PANEL
ALBUMIN: 3.9 g/dL (ref 3.6–5.1)
ALK PHOS: 79 U/L (ref 40–115)
ALT: 23 U/L (ref 9–46)
AST: 13 U/L (ref 10–35)
Bilirubin, Direct: 0.1 mg/dL (ref <=0.2)
Indirect Bilirubin: 0.4 mg/dL (ref 0.2–1.2)
Total Bilirubin: 0.5 mg/dL (ref 0.2–1.2)
Total Protein: 6.2 g/dL (ref 6.1–8.1)

## 2015-08-10 LAB — BASIC METABOLIC PANEL WITH GFR
BUN: 11 mg/dL (ref 7–25)
CALCIUM: 9.4 mg/dL (ref 8.6–10.3)
CO2: 26 mmol/L (ref 20–31)
Chloride: 101 mmol/L (ref 98–110)
Creat: 0.75 mg/dL (ref 0.70–1.25)
GFR, Est Non African American: >89 mL/min (ref >=60)
Glucose, Bld: 204 mg/dL — ABNORMAL HIGH (ref 65–99)
Potassium: 4.3 mmol/L (ref 3.5–5.3)
Sodium: 135 mmol/L (ref 135–146)

## 2015-08-10 LAB — TSH: TSH: 1.262 u[IU]/mL (ref 0.350–4.500)

## 2015-08-10 LAB — CBC WITH DIFFERENTIAL/PLATELET
Basophils Absolute: 0 10*3/uL (ref 0.0–0.1)
Basophils Relative: 0 % (ref 0–1)
EOS PCT: 4 % (ref 0–5)
Eosinophils Absolute: 0.3 10*3/uL (ref 0.0–0.7)
HCT: 45.3 % (ref 39.0–52.0)
Hemoglobin: 15.2 g/dL (ref 13.0–17.0)
Lymphocytes Relative: 29 % (ref 12–46)
Lymphs Abs: 2.1 10*3/uL (ref 0.7–4.0)
MCH: 31.3 pg (ref 26.0–34.0)
MCHC: 33.6 g/dL (ref 30.0–36.0)
MCV: 93.2 fL (ref 78.0–100.0)
MONOS PCT: 9 % (ref 3–12)
MPV: 11.5 fL (ref 8.6–12.4)
Monocytes Absolute: 0.7 10*3/uL (ref 0.1–1.0)
Neutro Abs: 4.3 10*3/uL (ref 1.7–7.7)
Neutrophils Relative %: 58 % (ref 43–77)
Platelets: 159 10*3/uL (ref 150–400)
RBC: 4.86 MIL/uL (ref 4.22–5.81)
RDW: 12.9 % (ref 11.5–15.5)
WBC: 7.4 10*3/uL (ref 4.0–10.5)

## 2015-08-10 LAB — LIPID PANEL
Cholesterol: 110 mg/dL — ABNORMAL LOW (ref 125–200)
HDL: 39 mg/dL — ABNORMAL LOW (ref >=40)
LDL CALC: 54 mg/dL (ref <130)
TRIGLYCERIDES: 83 mg/dL (ref <150)
Total CHOL/HDL Ratio: 2.8 Ratio (ref <=5.0)
VLDL: 17 mg/dL (ref <30)

## 2015-08-10 LAB — HEMOGLOBIN A1C
HEMOGLOBIN A1C: 8.9 % — AB (ref <5.7)
Mean Plasma Glucose: 209 mg/dL — ABNORMAL HIGH (ref <117)

## 2015-08-10 MED ORDER — BENZONATATE 200 MG PO CAPS: 200.0000 mg | ORAL_CAPSULE | Freq: Three times a day (TID) | ORAL | Status: DC | PRN

## 2015-08-10 NOTE — Progress Notes (Signed)
Patient ID: Joe Green, male   DOB: Oct 20, 1950, 64 y.o.   MRN: [id number]  Assessment and Plan:  Hypertension:  -Continue medication -monitor blood pressure at home. -Continue DASH diet -Reminder to go to the ER if any CP, SOB, nausea, dizziness, severe HA, changes vision/speech, left arm numbness and tingling and jaw pain.  Cholesterol - Continue diet and exercise -Check cholesterol.   Diabetes without complications -Continue diet and exercise.  -Check A1C  Vitamin D Def -check level -continue medications.   Smoking -wants to quit -given tips and tricks -can use chantix/wellbutrin if needed.  Continue diet and meds as discussed. Further disposition pending results of labs. Discussed med's effects and SE's.    HPI 64 y.o. male  presents for 3 month follow up with hypertension, hyperlipidemia, diabetes and vitamin D deficiency.   His blood pressure has been controlled at home, today their BP is BP: 110/60 mmHg.He does workout. He denies chest pain, shortness of breath, dizziness.   He is on cholesterol medication and denies myalgias. His cholesterol is at goal. The cholesterol was:  05/06/2015: Cholesterol 111*; HDL 38*; LDL Cholesterol 57; Triglycerides 81   He has been working on diet and exercise for diabetes without complications, he is on bASA, he is on ACE/ARB, and denies  foot ulcerations, hyperglycemia, hypoglycemia , increased appetite, nausea, paresthesia of the feet, polydipsia, polyuria, visual disturbances, vomiting and weight loss. Last A1C was: 05/06/2015: Hgb A1c MFr Bld 8.6*   Patient is on Vitamin D supplement. 05/06/2015: Vit D, 25-Hydroxy 66   He reports that he would like to quit smoking cigarrettes.  He reports that he has been smoking for longer than I've been alive.  He reports that he would like to do it off medications.  He has never seriously tried to quit before.      Current Medications:  Current Outpatient Prescriptions on File Prior to Visit   Medication Sig Dispense Refill  . aspirin 325 MG EC tablet Take 325 mg by mouth daily.      . Cholecalciferol (VITAMIN D PO) Take 5,000 mg by mouth daily.    . CRESTOR 20 MG tablet TAKE ONE TABLET BY MOUTH ONCE DAILY 30 tablet 3  . doxazosin (CARDURA) 8 MG tablet TAKE ONE TABLET BY MOUTH ONCE DAILY 90 tablet 1  . metFORMIN (GLUCOPHAGE) 1000 MG tablet Take 1 tablet (1,000 mg total) by mouth 2 (two) times daily. 60 tablet 4  . ONGLYZA 5 MG TABS tablet TAKE ONE TABLET BY MOUTH ONCE DAILY 30 tablet 5  . ramipril (ALTACE) 5 MG capsule Take 1 capsule (5 mg total) by mouth daily. 90 capsule 0  . rosuvastatin (CRESTOR) 20 MG tablet take Monday, Wednesday and Friday.     No current facility-administered medications on file prior to visit.   Medical History:  Past Medical History  Diagnosis Date  . CAD (coronary artery disease)     a. s/p stent to RCA 2005; b. LHC 2006 after Nuc with inf ischemia: patent RCA stent, D1 80%, LAD 60% => med Rx;  c. LHC 04/18/11: Proximal LAD 60% (unchanged from previous catheterization), distal LAD 65% (small caliber), Dx 60%, proximal CFX 20%, mid CFX 50%, OM1 30%, proximal RCA 20%, mid RCA stent patent, EF 65%.=> med Rx  . HLD (hyperlipidemia)   . Hypertension   . Type II or unspecified type diabetes mellitus without mention of complication, not stated as uncontrolled   . Obesity   . GERD (gastroesophageal reflux disease)   .  Vitamin D deficiency   . Wears glasses   . OSA (obstructive sleep apnea)     has a cpap-does not use-has a special mouthpiece he wears at night   Allergies: No Known Allergies   Review of Systems:  Review of Systems  Constitutional: Negative for fever, chills and malaise/fatigue.  HENT: Negative for congestion, ear pain and sore throat.   Respiratory: Positive for cough. Negative for shortness of breath and wheezing.   Cardiovascular: Negative for chest pain, palpitations and leg swelling.  Gastrointestinal: Negative for heartburn,  diarrhea, constipation, blood in stool and melena.  Genitourinary: Negative.   Neurological: Negative for dizziness, sensory change, loss of consciousness and headaches.  Psychiatric/Behavioral: Negative for depression. The patient is not nervous/anxious and does not have insomnia.     Family history- Review and unchanged  Social history- Review and unchanged  Physical Exam: BP 110/60 mmHg  Pulse 72  Temp(Src) 98 F (36.7 C) (Temporal)  Resp 16  Ht 5\' 9"  (1.753 m)  Wt 200 lb (90.719 kg)  BMI 29.52 kg/m2 Wt Readings from Last 3 Encounters:  08/10/15 200 lb (90.719 kg)  05/06/15 194 lb 12.8 oz (88.361 kg)  11/16/14 197 lb (89.359 kg)   General Appearance: Well nourished well developed, non-toxic appearing, in no apparent distress. Eyes: PERRLA, EOMs, conjunctiva no swelling or erythema ENT/Mouth: Ear canals clear with no erythema, swelling, or discharge.  TMs normal bilaterally, oropharynx clear, moist, with no exudate.   Neck: Supple, thyroid normal, no JVD, no cervical adenopathy.  Respiratory: Respiratory effort normal, breath sounds clear A&P, no wheeze, rhonchi or rales noted.  No retractions, no accessory muscle usage Cardio: RRR with no MRGs. No noted edema.  Abdomen: Soft, + BS.  Non tender, no guarding, rebound, hernias, masses. Musculoskeletal: Full ROM, 5/5 strength, Normal gait Skin: Warm, dry without rashes, lesions, ecchymosis.  Neuro: Awake and oriented X 3, Cranial nerves intact. No cerebellar symptoms.  Psych: normal affect, Insight and Judgment appropriate.    Starlyn Skeans, PA-C 9:23 AM [facility]

## 2015-08-10 NOTE — Patient Instructions (Signed)
Smoking Cessation, Tips for Success If you are ready to quit smoking, congratulations! You have chosen to help yourself be healthier. Cigarettes bring nicotine, tar, carbon monoxide, and other irritants into your body. Your lungs, heart, and blood vessels will be able to work better without these poisons. There are many different ways to quit smoking. Nicotine gum, nicotine patches, a nicotine inhaler, or nicotine nasal spray can help with physical craving. Hypnosis, support groups, and medicines help break the habit of smoking. WHAT THINGS CAN I DO TO MAKE QUITTING EASIER?  Here are some tips to help you quit for good:  Pick a date when you will quit smoking completely. Tell all of your friends and family about your plan to quit on that date.  Do not try to slowly cut down on the number of cigarettes you are smoking. Pick a quit date and quit smoking completely starting on that day.  Throw away all cigarettes.   Clean and remove all ashtrays from your home, work, and car.  On a card, write down your reasons for quitting. Carry the card with you and read it when you get the urge to smoke.  Cleanse your body of nicotine. Drink enough water and fluids to keep your urine clear or pale yellow. Do this after quitting to flush the nicotine from your body.  Learn to predict your moods. Do not let a bad situation be your excuse to have a cigarette. Some situations in your life might tempt you into wanting a cigarette.  Never have "just one" cigarette. It leads to wanting another and another. Remind yourself of your decision to quit.  Change habits associated with smoking. If you smoked while driving or when feeling stressed, try other activities to replace smoking. Stand up when drinking your coffee. Brush your teeth after eating. Sit in a different chair when you read the paper. Avoid alcohol while trying to quit, and try to drink fewer caffeinated beverages. Alcohol and caffeine may urge you to  smoke.  Avoid foods and drinks that can trigger a desire to smoke, such as sugary or spicy foods and alcohol.  Ask people who smoke not to smoke around you.  Have something planned to do right after eating or having a cup of coffee. For example, plan to take a walk or exercise.  Try a relaxation exercise to calm you down and decrease your stress. Remember, you may be tense and nervous for the first 2 weeks after you quit, but this will pass.  Find new activities to keep your hands busy. Play with a pen, coin, or rubber band. Doodle or draw things on paper.  Brush your teeth right after eating. This will help cut down on the craving for the taste of tobacco after meals. You can also try mouthwash.   Use oral substitutes in place of cigarettes. Try using lemon drops, carrots, cinnamon sticks, or chewing gum. Keep them handy so they are available when you have the urge to smoke.  When you have the urge to smoke, try deep breathing.  Designate your home as a nonsmoking area.  If you are a heavy smoker, ask your health care provider about a prescription for nicotine chewing gum. It can ease your withdrawal from nicotine.  Reward yourself. Set aside the cigarette money you save and buy yourself something nice.  Look for support from others. Join a support group or smoking cessation program. Ask someone at home or at work to help you with your plan   to quit smoking.  Always ask yourself, "Do I need this cigarette or is this just a reflex?" Tell yourself, "Today, I choose not to smoke," or "I do not want to smoke." You are reminding yourself of your decision to quit.  Do not replace cigarette smoking with electronic cigarettes (commonly called e-cigarettes). The safety of e-cigarettes is unknown, and some may contain harmful chemicals.  If you relapse, do not give up! Plan ahead and think about what you will do the next time you get the urge to smoke. HOW WILL I FEEL WHEN I QUIT SMOKING? You  may have symptoms of withdrawal because your body is used to nicotine (the addictive substance in cigarettes). You may crave cigarettes, be irritable, feel very hungry, cough often, get headaches, or have difficulty concentrating. The withdrawal symptoms are only temporary. They are strongest when you first quit but will go away within 10-14 days. When withdrawal symptoms occur, stay in control. Think about your reasons for quitting. Remind yourself that these are signs that your body is healing and getting used to being without cigarettes. Remember that withdrawal symptoms are easier to treat than the major diseases that smoking can cause.  Even after the withdrawal is over, expect periodic urges to smoke. However, these cravings are generally short lived and will go away whether you smoke or not. Do not smoke! WHAT RESOURCES ARE AVAILABLE TO HELP ME QUIT SMOKING? Your health care provider can direct you to community resources or hospitals for support, which may include:  Group support.  Education.  Hypnosis.  Therapy.   This information is not intended to replace advice given to you by your health care provider. Make sure you discuss any questions you have with your health care provider.   Document Released: 04/28/2004 Document Revised: 08/21/2014 Document Reviewed: 01/16/2013 Elsevier Interactive Patient Education 2016 [employer]  

## 2015-08-11 ENCOUNTER — Other Ambulatory Visit: Payer: Self-pay | Admitting: *Deleted

## 2015-08-11 MED ORDER — DAPAGLIFLOZIN PROPANEDIOL 10 MG PO TABS: 10.0000 mg | ORAL_TABLET | Freq: Every day | ORAL | Status: DC

## 2015-08-13 ENCOUNTER — Other Ambulatory Visit: Payer: Self-pay | Admitting: Internal Medicine

## 2015-09-13 ENCOUNTER — Other Ambulatory Visit: Payer: Self-pay | Admitting: Internal Medicine

## 2015-09-13 MED ORDER — DAPAGLIFLOZIN PROPANEDIOL 10 MG PO TABS: 10.0000 mg | ORAL_TABLET | Freq: Every day | ORAL | Status: DC

## 2015-09-30 ENCOUNTER — Encounter: Payer: Self-pay | Admitting: Internal Medicine

## 2015-09-30 ENCOUNTER — Ambulatory Visit (INDEPENDENT_AMBULATORY_CARE_PROVIDER_SITE_OTHER): Payer: BLUE CROSS/BLUE SHIELD | Admitting: Internal Medicine

## 2015-09-30 VITALS — BP 116/62 | HR 92 | Temp 98.2°F | Resp 18 | Ht 69.0 in | Wt 196.0 lb

## 2015-09-30 DIAGNOSIS — J069 Acute upper respiratory infection, unspecified: Secondary | ICD-10-CM

## 2015-09-30 MED ORDER — MOMETASONE FUROATE 50 MCG/ACT NA SUSP: 2.0000 | Freq: Every day | NASAL | Status: DC

## 2015-09-30 MED ORDER — PREDNISONE 20 MG PO TABS: ORAL_TABLET | ORAL | Status: DC

## 2015-09-30 MED ORDER — PROMETHAZINE-DM 6.25-15 MG/5ML PO SYRP: ORAL_SOLUTION | ORAL | Status: DC

## 2015-09-30 NOTE — Progress Notes (Signed)
Patient ID: Joe Green, male   DOB: February 10, 1951, 65 y.o.   MRN: [id number]  HPI  Patient presents to the office for evaluation of cough.  It has been going on for 4 days.  Patient reports night > day, dry, worse with lying down.  They also endorse change in voice, postnasal drip and nasal congestion, clear rhinorrhea, mild sore throat.  .  They have tried cepacol, nyquil.  They report that nothing has worked.  They admits to other sick contacts.  Review of Systems  Constitutional: Positive for malaise/fatigue. Negative for fever and chills.  HENT: Positive for congestion and sore throat. Negative for ear pain.   Respiratory: Positive for cough. Negative for shortness of breath and wheezing.   Cardiovascular: Negative for chest pain, palpitations and leg swelling.  Neurological: Negative for headaches.    PE:  Filed Vitals:   09/30/15 1115  BP: 116/62  Pulse: 92  Temp: 98.2 F (36.8 C)  Resp: 18    General:  Alert and non-toxic, WDWN, NAD HEENT: NCAT, PERLA, EOM normal, no occular discharge or erythema.  Nasal mucosal edema with sinus tenderness to palpation.  Oropharynx clear with minimal oropharyngeal edema and erythema.  Mucous membranes moist and pink. Neck:  Cervical adenopathy Chest:  RRR no MRGs.  Lungs clear to auscultation A&P with no wheezes rhonchi or rales.   Abdomen: +BS x 4 quadrants, soft, non-tender, no guarding, rigidity, or rebound. Skin: warm and dry no rash Neuro: A&Ox4, CN II-XII grossly intact  Assessment and Plan:   1. Acute URI -nasal saline -zyrtec D daily -drink plenty of fluids - predniSONE (DELTASONE) 20 MG tablet; 3 tabs po daily x 3 days, then 2 tabs x 3 days, then 1.5 tabs x 3 days, then 1 tab x 3 days, then 0.5 tabs x 3 days  Dispense: 27 tablet; Refill: 0 - promethazine-dextromethorphan (PROMETHAZINE-DM) 6.25-15 MG/5ML syrup; Take 5-10 mL PO q8hrs prn for cold symptoms.  Dispense: 360 mL; Refill: 1 - mometasone (NASONEX) 50 MCG/ACT nasal  spray; Place 2 sprays into the nose daily.  Dispense: 17 g; Refill: 2

## 2015-09-30 NOTE — Patient Instructions (Signed)
Please use saline in your nose as often as you can tolerate.  Please use nasonex 2 sprays per nostril right before bedtime.  Take cough syrup as needed for coughing.    Please take prednisone until it is completely gone.  Please use your zyrtec D daily.  Please drink plenty of fluids.    Call office if worsening breathing, fevers, or colored snot coming out of your nose.

## 2015-10-20 ENCOUNTER — Encounter: Payer: Self-pay | Admitting: Physician Assistant

## 2015-10-24 ENCOUNTER — Other Ambulatory Visit: Payer: Self-pay | Admitting: Internal Medicine

## 2015-11-18 ENCOUNTER — Other Ambulatory Visit: Payer: Self-pay

## 2015-11-18 ENCOUNTER — Encounter: Payer: Self-pay | Admitting: Physician Assistant

## 2015-11-18 ENCOUNTER — Ambulatory Visit (INDEPENDENT_AMBULATORY_CARE_PROVIDER_SITE_OTHER): Payer: BLUE CROSS/BLUE SHIELD | Admitting: Physician Assistant

## 2015-11-18 ENCOUNTER — Ambulatory Visit (HOSPITAL_COMMUNITY)
Admission: RE | Admit: 2015-11-18 | Discharge: 2015-11-18 | Disposition: A | Discharge status: Home / Self Care | Payer: BLUE CROSS/BLUE SHIELD | Source: Ambulatory Visit | Attending: Physician Assistant | Admitting: Physician Assistant

## 2015-11-18 VITALS — BP 130/70 | HR 74 | Temp 97.9°F | Resp 16 | Ht 70.0 in | Wt 192.4 lb

## 2015-11-18 DIAGNOSIS — E119 Type 2 diabetes mellitus without complications: Secondary | ICD-10-CM

## 2015-11-18 DIAGNOSIS — Z136 Encounter for screening for cardiovascular disorders: Secondary | ICD-10-CM

## 2015-11-18 DIAGNOSIS — G4733 Obstructive sleep apnea (adult) (pediatric): Secondary | ICD-10-CM

## 2015-11-18 DIAGNOSIS — F17200 Nicotine dependence, unspecified, uncomplicated: Secondary | ICD-10-CM

## 2015-11-18 DIAGNOSIS — Z0001 Encounter for general adult medical examination with abnormal findings: Secondary | ICD-10-CM

## 2015-11-18 DIAGNOSIS — J449 Chronic obstructive pulmonary disease, unspecified: Secondary | ICD-10-CM

## 2015-11-18 DIAGNOSIS — I10 Essential (primary) hypertension: Secondary | ICD-10-CM

## 2015-11-18 DIAGNOSIS — R6889 Other general symptoms and signs: Secondary | ICD-10-CM | POA: Insufficient documentation

## 2015-11-18 DIAGNOSIS — E559 Vitamin D deficiency, unspecified: Secondary | ICD-10-CM | POA: Diagnosis not present

## 2015-11-18 DIAGNOSIS — Z0000 Encounter for general adult medical examination without abnormal findings: Secondary | ICD-10-CM | POA: Diagnosis not present

## 2015-11-18 DIAGNOSIS — Z1159 Encounter for screening for other viral diseases: Secondary | ICD-10-CM

## 2015-11-18 DIAGNOSIS — Z79899 Other long term (current) drug therapy: Secondary | ICD-10-CM | POA: Diagnosis not present

## 2015-11-18 DIAGNOSIS — Z125 Encounter for screening for malignant neoplasm of prostate: Secondary | ICD-10-CM | POA: Diagnosis not present

## 2015-11-18 DIAGNOSIS — N400 Enlarged prostate without lower urinary tract symptoms: Secondary | ICD-10-CM

## 2015-11-18 DIAGNOSIS — R972 Elevated prostate specific antigen [PSA]: Secondary | ICD-10-CM | POA: Insufficient documentation

## 2015-11-18 DIAGNOSIS — E782 Mixed hyperlipidemia: Secondary | ICD-10-CM

## 2015-11-18 DIAGNOSIS — I2510 Atherosclerotic heart disease of native coronary artery without angina pectoris: Secondary | ICD-10-CM

## 2015-11-18 DIAGNOSIS — I2583 Coronary atherosclerosis due to lipid rich plaque: Secondary | ICD-10-CM

## 2015-11-18 DIAGNOSIS — G2581 Restless legs syndrome: Secondary | ICD-10-CM

## 2015-11-18 LAB — CBC WITH DIFFERENTIAL/PLATELET
BASOS PCT: 0 %
Basophils Absolute: 0 cells/uL (ref 0–200)
EOS ABS: 360 {cells}/uL (ref 15–500)
Eosinophils Relative: 4 %
HEMATOCRIT: 48.6 % (ref 38.5–50.0)
HEMOGLOBIN: 16.7 g/dL (ref 13.2–17.1)
LYMPHS ABS: 2160 {cells}/uL (ref 850–3900)
Lymphocytes Relative: 24 %
MCH: 31.8 pg (ref 27.0–33.0)
MCHC: 34.4 g/dL (ref 32.0–36.0)
MCV: 92.6 fL (ref 80.0–100.0)
MONO ABS: 630 {cells}/uL (ref 200–950)
MPV: 11.5 fL (ref 7.5–12.5)
Monocytes Relative: 7 %
Neutro Abs: 5850 cells/uL (ref 1500–7800)
Neutrophils Relative %: 65 %
Platelets: 139 10*3/uL — ABNORMAL LOW (ref 140–400)
RBC: 5.25 MIL/uL (ref 4.20–5.80)
RDW: 13.2 % (ref 11.0–15.0)
WBC: 9 10*3/uL (ref 3.8–10.8)

## 2015-11-18 LAB — LIPID PANEL
Cholesterol: 107 mg/dL — ABNORMAL LOW (ref 125–200)
HDL: 38 mg/dL — ABNORMAL LOW (ref >=40)
LDL CALC: 53 mg/dL (ref <130)
TRIGLYCERIDES: 79 mg/dL (ref <150)
Total CHOL/HDL Ratio: 2.8 Ratio (ref <=5.0)
VLDL: 16 mg/dL (ref <30)

## 2015-11-18 LAB — HEPATIC FUNCTION PANEL
ALK PHOS: 77 U/L (ref 40–115)
ALT: 21 U/L (ref 9–46)
AST: 17 U/L (ref 10–35)
Albumin: 4.2 g/dL (ref 3.6–5.1)
BILIRUBIN INDIRECT: 0.5 mg/dL (ref 0.2–1.2)
Bilirubin, Direct: 0.2 mg/dL (ref <=0.2)
TOTAL PROTEIN: 6.9 g/dL (ref 6.1–8.1)
Total Bilirubin: 0.7 mg/dL (ref 0.2–1.2)

## 2015-11-18 LAB — BASIC METABOLIC PANEL WITH GFR
BUN: 11 mg/dL (ref 7–25)
CHLORIDE: 100 mmol/L (ref 98–110)
CO2: 23 mmol/L (ref 20–31)
Calcium: 9.1 mg/dL (ref 8.6–10.3)
Creat: 0.75 mg/dL (ref 0.70–1.25)
GLUCOSE: 115 mg/dL — AB (ref 65–99)
POTASSIUM: 4.3 mmol/L (ref 3.5–5.3)
Sodium: 137 mmol/L (ref 135–146)

## 2015-11-18 LAB — HIV ANTIBODY (ROUTINE TESTING W REFLEX): HIV 1&2 Ab, 4th Generation: NONREACTIVE

## 2015-11-18 LAB — MAGNESIUM: MAGNESIUM: 2 mg/dL (ref 1.5–2.5)

## 2015-11-18 LAB — HEPATITIS C ANTIBODY: HCV Ab: NEGATIVE

## 2015-11-18 LAB — TSH: TSH: 1.45 mIU/L (ref 0.40–4.50)

## 2015-11-18 MED ORDER — DAPAGLIFLOZIN PROPANEDIOL 10 MG PO TABS: 10.0000 mg | ORAL_TABLET | Freq: Every day | ORAL | Status: DC

## 2015-11-18 NOTE — Patient Instructions (Signed)
Diabetes is a very complicated disease...lets simplify it.  An easy way to look at it to understand the complications is if you think of the extra sugar floating in your blood stream as glass shards floating through your blood stream.    Diabetes affects your small vessels first: 1) The glass shards (sugar) scraps down the tiny blood vessels in your eyes and lead to diabetic retinopathy, the leading cause of blindness in the [place]. Diabetes is the leading cause of newly diagnosed adult (52 to 64 years of age) blindness in the [place].  2) The glass shards scratches down the tiny vessels of your legs leading to nerve damage called neuropathy and can lead to amputations of your feet. More than 60% of all non-traumatic amputations of lower limbs occur in people with diabetes.  3) Over time the small vessels in your brain are shredded and closed off, individually this does not cause any problems but over a long period of time many of the small vessels being blocked can lead to Vascular Dementia.   4) Your kidney's are a filter system and have a "net" that keeps certain things in the body and lets bad things out. Sugar shreds this net and leads to kidney damage and eventually failure. Decreasing the sugar that is destroying the net and certain blood pressure medications can help stop or decrease progression of kidney disease. Diabetes was the primary cause of kidney failure in 44 percent of all new cases in 2011.  5) Diabetes also destroys the small vessels in your penis that lead to erectile dysfunction. Eventually the vessels are so damaged that you may not be responsive to cialis or viagra.   Diabetes and your large vessels: Your larger vessels consist of your coronary arteries in your heart and the carotid vessels to your brain. Diabetes or even increased sugars put you at 300% increased risk of heart attack and stroke and this is why.. The sugar scrapes down your large blood vessels and your body  sees this as an internal injury and tries to repair itself. Just like you get a scab on your skin, your platelets will stick to the blood vessel wall trying to heal it. This is why we have diabetics on low dose aspirin daily, this prevents the platelets from sticking and can prevent plaque formation. In addition, your body takes cholesterol and tries to shove it into the open wound. This is why we want your LDL, or bad cholesterol, below 70.   The combination of platelets and cholesterol over 5-10 years forms plaque that can break off and cause a heart attack or stroke.   PLEASE REMEMBER:  Diabetes is preventable! Up to 66 percent of complications and morbidities among individuals with type 2 diabetes can be prevented, delayed, or effectively treated and minimized with regular visits to a health professional, appropriate monitoring and medication, and a healthy diet and lifestyle.  Here is some information to help you keep your heart healthy: Move it! - Aim for 30 mins of activity every day. Take it slowly at first. Talk to [place] before starting any new exercise program.   Lose it.  -Body Mass Index (BMI) can indicate if you need to lose weight. A healthy range is 18.5-24.9. For a BMI calculator, go to [employer]  Waist Management -Excess abdominal fat is a risk factor for heart disease, diabetes, asthma, stroke and more. Ideal waist circumference is less than 35" for women and less than 40" for men.  Eat Right -focus on fruits, vegetables, whole grains, and meals you make yourself. Avoid foods with trans fat and high sugar/sodium content.   Snooze or Snore? - Loud snoring can be a sign of sleep apnea, a significant risk factor for high blood pressure, heart attach, stroke, and heart arrhythmias.  Kick the habit -Quit Smoking! Avoid second hand smoke. A single cigarette raises your blood pressure for 20 mins and increases the risk of heart attack and stroke for the next 24 hours.   Are  Aspirin and Supplements right for you? -Add ENTERIC COATED low dose 81 mg Aspirin daily OR can do every other day if you have easy bruising to protect your heart and head. As well as to reduce risk of Colon Cancer by 20 %, Skin Cancer by 26 % , Melanoma by 46% and Pancreatic cancer by 60%  Say "No to Stress -There may be little you can do about problems that cause stress. However, techniques such as long walks, meditation, and exercise can help you manage it.   Start Now! - Make changes one at a time and set reasonable goals to increase your likelihood of success.

## 2015-11-18 NOTE — Progress Notes (Signed)
Complete Physical  Assessment and Plan: 1. Essential hypertension - continue medications, DASH diet, exercise and monitor at home. Call if greater than 130/80.  - CBC with Differential/Platelet - BASIC METABOLIC PANEL WITH GFR - Hepatic function panel - TSH  2. Coronary artery disease due to lipid rich plaque Control blood pressure, cholesterol, glucose, increase exercise.  Continue cardio follow up Smoking cessation advised  3. Mixed hyperlipidemia -continue medications, check lipids, decrease fatty foods, increase activity. Goal is less than 70 - Lipid panel  4. Type 2 diabetes mellitus without complication Discussed general issues about diabetes pathophysiology and management., Educational material distributed., Suggested low cholesterol diet., Encouraged aerobic exercise., Discussed foot care., Reminded to get yearly retinal exam. May have to switch meds/add medications if not at goal of 6.5 - Hemoglobin A1c - Insulin, fasting - Urinalysis, Routine w reflex microscopic - Microalbumin / creatinine urine ratio - HM DIABETES FOOT EXAM  5. OSA (obstructive sleep apnea) On mouth piece, follows with Dr. Toy Cookey  6. Smoking Smoking cessation-  instruction/counseling given, counseled patient on the dangers of tobacco use, advised patient to stop smoking, and reviewed strategies to maximize success - DG Chest 2 View; Future  7. Vitamin D deficiency - Vit D  25 hydroxy (rtn osteoporosis monitoring)  8. Medication management - Magnesium  9. Elevated PSA - PSA  10. RLS (restless legs syndrome) Declines meds at this time.  - Vitamin B12 - Iron and TIBC - Ferritin  11.  Encounter for general adult medical examination with abnormal findings - DG Chest 2 View; Future - HM DIABETES FOOT EXAM  13. Chronic obstructive pulmonary disease, unspecified COPD, unspecified chronic bronchitis type Advised to stop smoking, will get CXR, continue meds.   14. BPH (benign prostatic  hyperplasia) Continue meds   Discussed med's effects and SE's. Screening labs and tests as requested with regular follow-up as recommended.  HPI 65 y.o. male  presents for a complete physical. His blood pressure has been controlled at home, today their BP is BP: 130/70 mmHg He is active but does not regularly exercise, denies chest pain, shortness of breath, dizziness.  He has a history of CAD s/p stent RCA in 2005, last cath 2012 and was unchanged, being treated medicaly and following with Dr. Johnsie Cancel.  His cholesterol is diet controlled. In addition he is on crestor 20mg   3 days a week and denies myalgias. His cholesterol is at goal less than 70. The cholesterol last visit was:   Lab Results  Component Value Date   CHOL 110* 08/10/2015   HDL 39* 08/10/2015   LDLCALC 54 08/10/2015   TRIG 83 08/10/2015   CHOLHDL 2.8 08/10/2015   He has been working on diet and exercise for Diabetes with other circulatory complications, he is on ASA, he is on ACE/ARB,, he is on metformin 850 BID and onglyza 5mg , last visit was farxiga was added, sugars have been in 130's, he is tolerating it well,, and denies paresthesia of the feet, polydipsia, polyuria and visual disturbances. Last A1C was:  Lab Results  Component Value Date   HGBA1C 8.9* 08/10/2015   Lab Results  Component Value Date   GFRNONAA >89 08/10/2015   He has BPH and is on cardura which helps. He has a history of elevated PSA in the past.  Lab Results  Component Value Date   PSA 3.58 05/06/2015   PSA 3.60 10/05/2014   PSA 3.84 01/02/2014  Patient is on Vitamin D supplement, 7000 IU daily.   Still  smoking but states that he has cut back on smoking.  Has OSA and he is on the dental appliance.  BMI is Body mass index is 27.61 kg/(m^2)., he is working on diet and exercise. Wt Readings from Last 3 Encounters:  11/18/15 192 lb 6.4 oz (87.272 kg)  09/30/15 196 lb (88.905 kg)  08/10/15 200 lb (90.719 kg)   Current Medications:     Medication List       This list is accurate as of: 11/18/15  9:07 AM.  Always use your most recent med list.               aspirin EC 325 MG tablet  Take by mouth.     benzonatate 200 MG capsule  Commonly known as:  TESSALON  Take 1 capsule (200 mg total) by mouth 3 (three) times daily as needed for cough.     rosuvastatin 5 MG tablet  Commonly known as:  CRESTOR  Take by mouth.     CRESTOR 20 MG tablet  Generic drug:  rosuvastatin  TAKE ONE TABLET BY MOUTH ONCE DAILY     dapagliflozin propanediol 10 MG Tabs tablet  Commonly known as:  FARXIGA  Take 10 mg by mouth daily.     doxazosin 8 MG tablet  Commonly known as:  CARDURA  TAKE ONE TABLET BY MOUTH ONCE DAILY     metFORMIN 1000 MG tablet  Commonly known as:  GLUCOPHAGE  Take 1 tablet (1,000 mg total) by mouth 2 (two) times daily.     ONGLYZA 5 MG Tabs tablet  Generic drug:  saxagliptin HCl  TAKE ONE TABLET BY MOUTH ONCE DAILY     ramipril 5 MG capsule  Commonly known as:  ALTACE  TAKE ONE CAPSULE BY MOUTH ONCE DAILY     VITAMIN D PO  Take 5,000 mg by mouth daily.        Health Maintenance:  Immunization History  Administered Date(s) Administered  . Influenza-Unspecified 06/02/2013, 08/18/2014  . Pneumococcal Polysaccharide-23 08/27/2007  . Tdap 08/27/2007  . Zoster 10/20/2013   Tetanus: 2009 Pneumovax: 2009 Prevnar13: will wait until 66 Flu vaccine: 08/2014 Zostavax: 2015 DEXA: N/A Colonoscopy: 10/2011 due 2018 Dr. Fuller Plan EGD: N/A CT head 2012 Carotid US 2012 2012 DEE- 2 years- [facility]- wears glasses- OVER DUE Dentist: [facility]   Allergies: No Known Allergies Medical History:  Past Medical History  Diagnosis Date  . CAD (coronary artery disease)     a. s/p stent to RCA 2005; b. LHC 2006 after Nuc with inf ischemia: patent RCA stent, D1 80%, LAD 60% => med Rx;  c. LHC 04/18/11: Proximal LAD 60% (unchanged from previous catheterization), distal LAD 65% (small caliber), Dx  60%, proximal CFX 20%, mid CFX 50%, OM1 30%, proximal RCA 20%, mid RCA stent patent, EF 65%.=> med Rx  . HLD (hyperlipidemia)   . Hypertension   . Type II or unspecified type diabetes mellitus without mention of complication, not stated as uncontrolled   . Obesity   . GERD (gastroesophageal reflux disease)   . Vitamin D deficiency   . Wears glasses   . OSA (obstructive sleep apnea)     has a cpap-does not use-has a special mouthpiece he wears at night   Surgical History:  Past Surgical History  Procedure Laterality Date  . Heart stent  2005    RCA  . Colonoscopy  11/2011    Due 5 years Dr. Fuller Plan  . Polypectomy    .  Cardiac catheterization  2005    stent  . Inguinal hernia repair Right 11/16/2014    Procedure: RIGHT INGUINAL HERNIA REPAIR WITH MESH;  Surgeon: Armandina Gemma, MD;  Location: [facility];  Service: General;  Laterality: Right;  . Insertion of mesh Right 11/16/2014    Procedure: INSERTION OF MESH;  Surgeon: Armandina Gemma, MD;  Location: [facility];  Service: General;  Laterality: Right;   Family History:  Family History  Problem Relation Age of Onset  . Prostate cancer Father   . Stroke Father   . Hypertension Father    Social History:  Social History  Substance Use Topics  . Smoking status: Current Every Day Smoker -- 0.50 packs/day for 40 years    Types: Cigarettes  . Smokeless tobacco: Never Used  . Alcohol Use: Yes     Comment: 2-3 beers most nights   Review of Systems:  Review of Systems  Constitutional: Negative.   HENT: Negative.   Respiratory: Negative.  Negative for cough, shortness of breath and wheezing.   Cardiovascular: Negative.  Negative for chest pain and claudication.  Gastrointestinal: Positive for heartburn. Negative for nausea, vomiting, abdominal pain, diarrhea, constipation, blood in stool and melena.  Genitourinary: Negative.   Musculoskeletal: Positive for myalgias ( RLS) and back pain. Negative for joint pain,  falls and neck pain.  Skin: Negative.   Neurological: Negative.  Negative for dizziness.  Psychiatric/Behavioral: Negative.    Physical Exam: Estimated body mass index is 27.61 kg/(m^2) as calculated from the following:   Height as of this encounter: 5\' 10"  (1.778 m).   Weight as of this encounter: 192 lb 6.4 oz (87.272 kg). Filed Vitals:   11/18/15 0903  BP: 130/70  Pulse: 74  Temp: 97.9 F (36.6 C)  Resp: 16   General Appearance: Well nourished, in no apparent distress. Eyes: PERRLA, EOMs, conjunctiva no swelling or erythema, normal fundi and vessels. Sinuses: No Frontal/maxillary tenderness ENT/Mouth: Ext aud canals clear, normal light reflex with TMs without erythema, bulging. Good dentition. No erythema, swelling, or exudate on post pharynx. Tonsils not swollen or erythematous. Hearing normal.  Neck: Supple, thyroid normal. No bruits Respiratory: Respiratory effort normal, BS equal bilaterally without rales, rhonchi, wheezing or stridor. Cardio: RRR without murmurs, rubs or gallops. Brisk peripheral pulses without edema.  Chest: symmetric, with normal excursions and percussion. Abdomen: Soft, +BS. Non tender, no guarding, rebound, hernias, masses, or organomegaly. + right inguinal hernia Lymphatics: Non tender without lymphadenopathy.  Genitourinary: defer Musculoskeletal: Full ROM all peripheral extremities,5/5 strength, and normal gait. Skin: Warm, dry without rashes, lesions, ecchymosis. Foot exam normal.  Neuro: Cranial nerves intact, reflexes equal bilaterally. Normal muscle tone, no cerebellar symptoms. Sensation intact.  Psych: Awake and oriented X 3, normal affect, Insight and Judgment appropriate.   EKG: WNL no changes, CRBBB   Vicie Mutters 9:06 AM

## 2015-11-19 LAB — URINALYSIS, MICROSCOPIC ONLY
Bacteria, UA: NONE SEEN [HPF]
Casts: NONE SEEN [LPF]
Crystals: NONE SEEN [HPF]
RBC / HPF: NONE SEEN RBC/HPF (ref <=2)
SQUAMOUS EPITHELIAL / LPF: NONE SEEN [HPF] (ref <=5)
WBC, UA: NONE SEEN WBC/HPF (ref <=5)
YEAST: NONE SEEN [HPF]

## 2015-11-19 LAB — URINALYSIS, ROUTINE W REFLEX MICROSCOPIC
Bilirubin Urine: NEGATIVE
Hgb urine dipstick: NEGATIVE
Ketones, ur: NEGATIVE
Leukocytes, UA: NEGATIVE
NITRITE: NEGATIVE
PROTEIN: NEGATIVE
Specific Gravity, Urine: 1.035 (ref 1.001–1.035)
pH: 6 (ref 5.0–8.0)

## 2015-11-19 LAB — MICROALBUMIN / CREATININE URINE RATIO
CREATININE, URINE: 95 mg/dL (ref 20–370)
MICROALB/CREAT RATIO: 8 ug/mg{creat} (ref <30)
Microalb, Ur: 0.8 mg/dL

## 2015-11-19 LAB — PSA: PSA: 3.57 ng/mL (ref <=4.00)

## 2015-11-19 LAB — HEMOGLOBIN A1C
HEMOGLOBIN A1C: 7.5 % — AB (ref <5.7)
Mean Plasma Glucose: 169 mg/dL

## 2015-11-19 LAB — VITAMIN D 25 HYDROXY (VIT D DEFICIENCY, FRACTURES): VIT D 25 HYDROXY: 84 ng/mL (ref 30–100)

## 2015-12-10 ENCOUNTER — Other Ambulatory Visit: Payer: Self-pay | Admitting: Physician Assistant

## 2015-12-24 ENCOUNTER — Other Ambulatory Visit: Payer: Self-pay | Admitting: Internal Medicine

## 2016-02-13 ENCOUNTER — Other Ambulatory Visit: Payer: Self-pay | Admitting: Physician Assistant

## 2016-02-13 ENCOUNTER — Other Ambulatory Visit: Payer: Self-pay | Admitting: Internal Medicine

## 2016-02-24 ENCOUNTER — Ambulatory Visit: Payer: Self-pay | Admitting: Physician Assistant

## 2016-03-11 ENCOUNTER — Other Ambulatory Visit: Payer: Self-pay | Admitting: Internal Medicine

## 2016-03-11 DIAGNOSIS — N320 Bladder-neck obstruction: Secondary | ICD-10-CM

## 2016-03-21 ENCOUNTER — Other Ambulatory Visit: Payer: Self-pay | Admitting: Internal Medicine

## 2016-03-24 ENCOUNTER — Encounter: Payer: Self-pay | Admitting: Physician Assistant

## 2016-03-24 ENCOUNTER — Other Ambulatory Visit: Payer: Self-pay

## 2016-03-24 ENCOUNTER — Ambulatory Visit (INDEPENDENT_AMBULATORY_CARE_PROVIDER_SITE_OTHER): Payer: Managed Care, Other (non HMO) | Admitting: Physician Assistant

## 2016-03-24 VITALS — BP 120/66 | HR 68 | Temp 97.5°F | Resp 16 | Ht 69.0 in | Wt 193.2 lb

## 2016-03-24 DIAGNOSIS — Z79899 Other long term (current) drug therapy: Secondary | ICD-10-CM | POA: Diagnosis not present

## 2016-03-24 DIAGNOSIS — I10 Essential (primary) hypertension: Secondary | ICD-10-CM | POA: Diagnosis not present

## 2016-03-24 DIAGNOSIS — E782 Mixed hyperlipidemia: Secondary | ICD-10-CM | POA: Diagnosis not present

## 2016-03-24 DIAGNOSIS — E559 Vitamin D deficiency, unspecified: Secondary | ICD-10-CM

## 2016-03-24 DIAGNOSIS — E119 Type 2 diabetes mellitus without complications: Secondary | ICD-10-CM

## 2016-03-24 DIAGNOSIS — I2510 Atherosclerotic heart disease of native coronary artery without angina pectoris: Secondary | ICD-10-CM | POA: Diagnosis not present

## 2016-03-24 DIAGNOSIS — I2583 Coronary atherosclerosis due to lipid rich plaque: Secondary | ICD-10-CM

## 2016-03-24 LAB — CBC WITH DIFFERENTIAL/PLATELET
BASOS ABS: 0 {cells}/uL (ref 0–200)
Basophils Relative: 0 %
EOS ABS: 435 {cells}/uL (ref 15–500)
Eosinophils Relative: 5 %
HEMATOCRIT: 49.7 % (ref 38.5–50.0)
HEMOGLOBIN: 16.7 g/dL (ref 13.2–17.1)
LYMPHS ABS: 2175 {cells}/uL (ref 850–3900)
Lymphocytes Relative: 25 %
MCH: 31 pg (ref 27.0–33.0)
MCHC: 33.6 g/dL (ref 32.0–36.0)
MCV: 92.2 fL (ref 80.0–100.0)
MPV: 12.2 fL (ref 7.5–12.5)
Monocytes Absolute: 609 cells/uL (ref 200–950)
Monocytes Relative: 7 %
NEUTROS ABS: 5481 {cells}/uL (ref 1500–7800)
Neutrophils Relative %: 63 %
Platelets: 149 10*3/uL (ref 140–400)
RBC: 5.39 MIL/uL (ref 4.20–5.80)
RDW: 13.1 % (ref 11.0–15.0)
WBC: 8.7 10*3/uL (ref 3.8–10.8)

## 2016-03-24 LAB — BASIC METABOLIC PANEL WITH GFR
BUN: 11 mg/dL (ref 7–25)
CHLORIDE: 103 mmol/L (ref 98–110)
CO2: 25 mmol/L (ref 20–31)
CREATININE: 0.78 mg/dL (ref 0.70–1.25)
Calcium: 9.3 mg/dL (ref 8.6–10.3)
GFR, Est African American: >89 mL/min (ref >=60)
GFR, Est Non African American: >89 mL/min (ref >=60)
GLUCOSE: 134 mg/dL — AB (ref 65–99)
Potassium: 4.3 mmol/L (ref 3.5–5.3)
Sodium: 138 mmol/L (ref 135–146)

## 2016-03-24 LAB — TSH: TSH: 1.62 m[IU]/L (ref 0.40–4.50)

## 2016-03-24 LAB — HEPATIC FUNCTION PANEL
ALT: 18 U/L (ref 9–46)
AST: 15 U/L (ref 10–35)
Albumin: 4.3 g/dL (ref 3.6–5.1)
Alkaline Phosphatase: 102 U/L (ref 40–115)
BILIRUBIN DIRECT: 0.1 mg/dL (ref <=0.2)
Indirect Bilirubin: 0.3 mg/dL (ref 0.2–1.2)
Total Bilirubin: 0.4 mg/dL (ref 0.2–1.2)
Total Protein: 6.4 g/dL (ref 6.1–8.1)

## 2016-03-24 LAB — LIPID PANEL
CHOLESTEROL: 109 mg/dL — AB (ref 125–200)
HDL: 42 mg/dL (ref >=40)
LDL CALC: 48 mg/dL (ref <130)
Total CHOL/HDL Ratio: 2.6 Ratio (ref <=5.0)
Triglycerides: 94 mg/dL (ref <150)
VLDL: 19 mg/dL (ref <30)

## 2016-03-24 LAB — MAGNESIUM: MAGNESIUM: 2.1 mg/dL (ref 1.5–2.5)

## 2016-03-24 LAB — HEMOGLOBIN A1C
Hgb A1c MFr Bld: 7 % — ABNORMAL HIGH (ref <5.7)
MEAN PLASMA GLUCOSE: 154 mg/dL

## 2016-03-24 MED ORDER — BENZONATATE 200 MG PO CAPS: 200.0000 mg | ORAL_CAPSULE | Freq: Three times a day (TID) | ORAL | 1 refills | Status: DC | PRN

## 2016-03-24 NOTE — Patient Instructions (Signed)
If you have a smart phone, please look up Smoke Free app, this will help you stay on track and give you information about money you have saved, life that you have gained back and a ton of more information.     ADVANTAGES OF QUITTING SMOKING  Within 20 minutes, blood pressure decreases. Your pulse is at normal level.  After 8 hours, carbon monoxide levels in the blood return to normal. Your oxygen level increases.  After 24 hours, the chance of having a heart attack starts to decrease. Your breath, hair, and body stop smelling like smoke.  After 48 hours, damaged nerve endings begin to recover. Your sense of taste and smell improve.  After 72 hours, the body is virtually free of nicotine. Your bronchial tubes relax and breathing becomes easier.  After 2 to 12 weeks, lungs can hold more air. Exercise becomes easier and circulation improves.  After 1 year, the risk of coronary heart disease is cut in half.  After 5 years, the risk of stroke falls to the same as a nonsmoker.  After 10 years, the risk of lung cancer is cut in half and the risk of other cancers decreases significantly.  After 15 years, the risk of coronary heart disease drops, usually to the level of a nonsmoker.  You will have extra money to spend on things other than cigarettes.   Your A1C is a measure of your sugar over the past 3 months and is not affected by what you have eaten over the past few days. Diabetes increases your chances of stroke and heart attack over 300 % and is the leading cause of blindness and kidney failure in the [place]. Please make sure you decrease bad carbs like white bread, white rice, potatoes, corn, soft drinks, pasta, cereals, refined sugars, sweet tea, dried fruits, and fruit juice. Good carbs are okay to eat in moderation like sweet potatoes, brown rice, whole grain pasta/bread, most fruit (except dried fruit) and you can eat as many veggies as you want.   Greater than 6.5 is  considered diabetic. Between 6.4 and 5.7 is prediabetic If your A1C is less than 5.7 you are NOT diabetic.  Targets for Glucose Readings: Time of Check Target for patients WITHOUT Diabetes Target for DIABETICS  Before Meals Less than 100  less than 150  Two hours after meals Less than 200  Less than 250

## 2016-03-24 NOTE — Progress Notes (Signed)
Patient ID: Joe Green, male   DOB: 1951-08-14, 65 y.o.   MRN: [id number]  Assessment and Plan:  Hypertension:  -Continue medication -monitor blood pressure at home. -Continue DASH diet -Reminder to go to the ER if any CP, SOB, nausea, dizziness, severe HA, changes vision/speech, left arm numbness and tingling and jaw pain.  Cholesterol - Continue diet and exercise -Check cholesterol.   Diabetes without complications -Continue diet and exercise.  -Check A1C  Vitamin D Def -check level -continue medications.   Smoking -  instruction/counseling given, counseled patient on the dangers of tobacco use, advised patient to stop smoking, and reviewed strategies to maximize success, quit date is 3 months when he gets back  COPD Advised to stop smoking, tessalon PRN, declines additional meds at this time.   Continue diet and meds as discussed. Further disposition pending results of labs. Discussed med's effects and SE's.    HPI 65 y.o. male  presents for 3 month follow up with hypertension, hyperlipidemia, diabetes and vitamin D deficiency.   His blood pressure has been controlled at home, today their BP is BP: 120/66.He does workout. He denies chest pain, shortness of breath, dizziness. He is a smoker, he would like to quit.    He is on cholesterol medication and denies myalgias. His cholesterol is at goal. The cholesterol was:  11/18/2015: Cholesterol 107; HDL 38; LDL Cholesterol 53; Triglycerides 79   He has been working on diet and exercise for diabetes without complications, he is on bASA, he is on ACE/ARB, and denies  foot ulcerations, hyperglycemia, hypoglycemia , increased appetite, nausea, paresthesia of the feet, polydipsia, polyuria, visual disturbances, vomiting and weight loss. He has not had ongylza for several months, is on MF and farxiga, he checks his sugars 2-3 x a week, sugars are 130-140 in the monring. Last A1C was: 11/18/2015: Hgb A1c MFr Bld 7.5   Patient is on  Vitamin D supplement. 11/18/2015: Vit D, 25-Hydroxy 84   BMI is Body mass index is 28.53 kg/m., he is working on diet and exercise. Wt Readings from Last 3 Encounters:  03/24/16 193 lb 3.2 oz (87.6 kg)  11/18/15 192 lb 6.4 oz (87.3 kg)  09/30/15 196 lb (88.9 kg)    Current Medications:  Current Outpatient Prescriptions on File Prior to Visit  Medication Sig Dispense Refill  . aspirin EC 325 MG tablet Take by mouth.    . benzonatate (TESSALON) 200 MG capsule Take 1 capsule (200 mg total) by mouth 3 (three) times daily as needed for cough. 30 capsule 1  . Cholecalciferol (VITAMIN D PO) Take 5,000 mg by mouth daily.    . dapagliflozin propanediol (FARXIGA) 10 MG TABS tablet Take 10 mg by mouth daily. 90 tablet 1  . doxazosin (CARDURA) 8 MG tablet TAKE ONE TABLET BY MOUTH ONCE DAILY 90 tablet 0  . metFORMIN (GLUCOPHAGE) 1000 MG tablet TAKE ONE TABLET BY MOUTH TWICE DAILY 180 tablet 1  . ramipril (ALTACE) 5 MG capsule TAKE ONE CAPSULE BY MOUTH ONCE DAILY 90 capsule 1  . rosuvastatin (CRESTOR) 20 MG tablet TAKE ONE TABLET BY MOUTH ONCE DAILY 90 tablet 1   No current facility-administered medications on file prior to visit.    Medical History:  Past Medical History:  Diagnosis Date  . CAD (coronary artery disease)    a. s/p stent to RCA 2005; b. [place] 2006 after Nuc with inf ischemia: patent RCA stent, D1 80%, LAD 60% => med Rx;  c. LHC 04/18/11: Proximal LAD  60% (unchanged from previous catheterization), distal LAD 65% (small caliber), Dx 60%, proximal CFX 20%, mid CFX 50%, OM1 30%, proximal RCA 20%, mid RCA stent patent, EF 65%.=> med Rx  . GERD (gastroesophageal reflux disease)   . HLD (hyperlipidemia)   . Hypertension   . Obesity   . OSA (obstructive sleep apnea)    has a cpap-does not use-has a special mouthpiece he wears at night  . Type II or unspecified type diabetes mellitus without mention of complication, not stated as uncontrolled   . Vitamin D deficiency   . Wears glasses     Allergies: No Known Allergies   Review of Systems:  Review of Systems  Constitutional: Negative for chills, fever and malaise/fatigue.  HENT: Negative for congestion, ear pain and sore throat.   Respiratory: Negative for cough, shortness of breath and wheezing.   Cardiovascular: Negative for chest pain, palpitations and leg swelling.  Gastrointestinal: Negative for blood in stool, constipation, diarrhea, heartburn and melena.  Genitourinary: Negative.   Neurological: Negative for dizziness, sensory change, loss of consciousness and headaches.  Psychiatric/Behavioral: Negative for depression. The patient is not nervous/anxious and does not have insomnia.     Family history- Review and unchanged  Social history- Review and unchanged  Physical Exam: BP 120/66   Pulse 68   Temp 97.5 F (36.4 C)   Resp 16   Ht 5\' 9"  (1.753 m)   Wt 193 lb 3.2 oz (87.6 kg)   SpO2 96%   BMI 28.53 kg/m  Wt Readings from Last 3 Encounters:  03/24/16 193 lb 3.2 oz (87.6 kg)  11/18/15 192 lb 6.4 oz (87.3 kg)  09/30/15 196 lb (88.9 kg)   General Appearance: Well nourished well developed, non-toxic appearing, in no apparent distress. Eyes: PERRLA, EOMs, conjunctiva no swelling or erythema ENT/Mouth: Ear canals clear with no erythema, swelling, or discharge.  TMs normal bilaterally, oropharynx clear, moist, with no exudate.   Neck: Supple, thyroid normal, no JVD, no cervical adenopathy.  Respiratory: Respiratory effort normal, breath sounds clear A&P, no wheeze, rhonchi or rales noted.  No retractions, no accessory muscle usage Cardio: RRR with no MRGs. No noted edema.  Abdomen: Soft, + BS.  Non tender, no guarding, rebound, hernias, masses. Musculoskeletal: Full ROM, 5/5 strength, Normal gait Skin: Warm, dry without rashes, lesions, ecchymosis.  Neuro: Awake and oriented X 3, Cranial nerves intact. No cerebellar symptoms.  Psych: normal affect, Insight and Judgment appropriate.    Vicie Mutters,  PA-C 8:50 AM [facility]

## 2016-03-25 LAB — VITAMIN D 25 HYDROXY (VIT D DEFICIENCY, FRACTURES): Vit D, 25-Hydroxy: 71 ng/mL (ref 30–100)

## 2016-03-27 NOTE — Progress Notes (Signed)
Pt aware of lab results & voice understanding.

## 2016-06-06 ENCOUNTER — Other Ambulatory Visit: Payer: Self-pay | Admitting: Internal Medicine

## 2016-06-06 DIAGNOSIS — N320 Bladder-neck obstruction: Secondary | ICD-10-CM

## 2016-06-30 ENCOUNTER — Ambulatory Visit (INDEPENDENT_AMBULATORY_CARE_PROVIDER_SITE_OTHER): Payer: Managed Care, Other (non HMO) | Admitting: Physician Assistant

## 2016-06-30 ENCOUNTER — Encounter: Payer: Self-pay | Admitting: Physician Assistant

## 2016-06-30 VITALS — BP 134/72 | HR 78 | Temp 98.2°F | Resp 16 | Ht 69.0 in | Wt 195.0 lb

## 2016-06-30 DIAGNOSIS — E559 Vitamin D deficiency, unspecified: Secondary | ICD-10-CM | POA: Diagnosis not present

## 2016-06-30 DIAGNOSIS — E782 Mixed hyperlipidemia: Secondary | ICD-10-CM

## 2016-06-30 DIAGNOSIS — J449 Chronic obstructive pulmonary disease, unspecified: Secondary | ICD-10-CM

## 2016-06-30 DIAGNOSIS — Z79899 Other long term (current) drug therapy: Secondary | ICD-10-CM | POA: Diagnosis not present

## 2016-06-30 DIAGNOSIS — I2583 Coronary atherosclerosis due to lipid rich plaque: Secondary | ICD-10-CM

## 2016-06-30 DIAGNOSIS — E119 Type 2 diabetes mellitus without complications: Secondary | ICD-10-CM

## 2016-06-30 DIAGNOSIS — I2510 Atherosclerotic heart disease of native coronary artery without angina pectoris: Secondary | ICD-10-CM | POA: Diagnosis not present

## 2016-06-30 DIAGNOSIS — F17200 Nicotine dependence, unspecified, uncomplicated: Secondary | ICD-10-CM

## 2016-06-30 DIAGNOSIS — I10 Essential (primary) hypertension: Secondary | ICD-10-CM

## 2016-06-30 LAB — CBC WITH DIFFERENTIAL/PLATELET
BASOS ABS: 0 {cells}/uL (ref 0–200)
Basophils Relative: 0 %
EOS ABS: 328 {cells}/uL (ref 15–500)
Eosinophils Relative: 4 %
HEMATOCRIT: 47.2 % (ref 38.5–50.0)
Hemoglobin: 16.2 g/dL (ref 13.2–17.1)
LYMPHS PCT: 24 %
Lymphs Abs: 1968 cells/uL (ref 850–3900)
MCH: 31.7 pg (ref 27.0–33.0)
MCHC: 34.3 g/dL (ref 32.0–36.0)
MCV: 92.4 fL (ref 80.0–100.0)
MONO ABS: 656 {cells}/uL (ref 200–950)
MPV: 11.6 fL (ref 7.5–12.5)
Monocytes Relative: 8 %
NEUTROS PCT: 64 %
Neutro Abs: 5248 cells/uL (ref 1500–7800)
Platelets: 161 10*3/uL (ref 140–400)
RBC: 5.11 MIL/uL (ref 4.20–5.80)
RDW: 13.5 % (ref 11.0–15.0)
WBC: 8.2 10*3/uL (ref 3.8–10.8)

## 2016-06-30 LAB — HEMOGLOBIN A1C
HEMOGLOBIN A1C: 7.1 % — AB (ref <5.7)
MEAN PLASMA GLUCOSE: 157 mg/dL

## 2016-06-30 LAB — TSH: TSH: 1.03 mIU/L (ref 0.40–4.50)

## 2016-06-30 MED ORDER — FLUTICASONE FUROATE-VILANTEROL 100-25 MCG/INH IN AEPB: 1.0000 | INHALATION_SPRAY | Freq: Every day | RESPIRATORY_TRACT | 3 refills | Status: DC

## 2016-06-30 NOTE — Patient Instructions (Addendum)
Try the breo once daily  If you have a smart phone, please look up Smoke Free app, this will help you stay on track and give you information about money you have saved, life that you have gained back and a ton of more information.   We are giving you chantix for smoking cessation. You can do it! And we are here to help! You may have heard some scary side effects about chantix, the three most common I hear about are nausea, crazy dreams and depression.  However, I like for my patients to try to stay on 1/2 a tablet twice a day rather than one tablet twice a day as normally prescribed. This helps decrease the chances of side effects and helps save money by making a one month prescription last two months  Please start the prescription this way:  Start 1/2 tablet by mouth once daily after food with a full glass of water for 3 days Then do 1/2 tablet by mouth twice daily for 4 days. During this first week you can smoke, but try to stop after this week.  At this point we have several options: 1) continue on 1/2 tablet twice a day- which I encourage you to do. You can stay on this dose the rest of the time on the medication or if you still feel the need to smoke you can do one of the two options below. 2) do one tablet in the morning and 1/2 in the evening which helps decrease dreams. 3) do one tablet twice a day.   What if I miss a dose? If you miss a dose, take it as soon as you can. If it is almost time for your next dose, take only that dose. Do not take double or extra doses.  What should I watch for while using this medicine? Visit your doctor or health care professional for regular check ups. Ask for ongoing advice and encouragement from your doctor or healthcare professional, friends, and family to help you quit. If you smoke while on this medication, quit again  Your mouth may get dry. Chewing sugarless gum or hard candy, and drinking plenty of water may help. Contact your doctor if the  problem does not go away or is severe.  You may get drowsy or dizzy. Do not drive, use machinery, or do anything that needs mental alertness until you know how this medicine affects you. Do not stand or sit up quickly, especially if you are an older patient.   The use of this medicine may increase the chance of suicidal thoughts or actions. Pay special attention to how you are responding while on this medicine. Any worsening of mood, or thoughts of suicide or dying should be reported to your health care professional right away.  ADVANTAGES OF QUITTING SMOKING  Within 20 minutes, blood pressure decreases. Your pulse is at normal level.  After 8 hours, carbon monoxide levels in the blood return to normal. Your oxygen level increases.  After 24 hours, the chance of having a heart attack starts to decrease. Your breath, hair, and body stop smelling like smoke.  After 48 hours, damaged nerve endings begin to recover. Your sense of taste and smell improve.  After 72 hours, the body is virtually free of nicotine. Your bronchial tubes relax and breathing becomes easier.  After 2 to 12 weeks, lungs can hold more air. Exercise becomes easier and circulation improves.  After 1 year, the risk of coronary heart disease is cut  in half.  After 5 years, the risk of stroke falls to the same as a nonsmoker.  After 10 years, the risk of lung cancer is cut in half and the risk of other cancers decreases significantly.  After 15 years, the risk of coronary heart disease drops, usually to the level of a nonsmoker.  You will have extra money to spend on things other than cigarettes.  Here is some information to help you keep your heart healthy: Move it! - Aim for 30 mins of activity every day. Take it slowly at first. Talk to [place] before starting any new exercise program.   Lose it.  -Body Mass Index (BMI) can indicate if you need to lose weight. A healthy range is 18.5-24.9. For a BMI calculator, go to  [employer]  Waist Management -Excess abdominal fat is a risk factor for heart disease, diabetes, asthma, stroke and more. Ideal waist circumference is less than 35" for women and less than 40" for men.   Eat Right -focus on fruits, vegetables, whole grains, and meals you make yourself. Avoid foods with trans fat and high sugar/sodium content.   Snooze or Snore? - Loud snoring can be a sign of sleep apnea, a significant risk factor for high blood pressure, heart attach, stroke, and heart arrhythmias.  Kick the habit -Quit Smoking! Avoid second hand smoke. A single cigarette raises your blood pressure for 20 mins and increases the risk of heart attack and stroke for the next 24 hours.   Are Aspirin and Supplements right for you? -Add ENTERIC COATED low dose 81 mg Aspirin daily OR can do every other day if you have easy bruising to protect your heart and head. As well as to reduce risk of Colon Cancer by 20 %, Skin Cancer by 26 % , Melanoma by 46% and Pancreatic cancer by 60%  Say "No to Stress -There may be little you can do about problems that cause stress. However, techniques such as long walks, meditation, and exercise can help you manage it.   Start Now! - Make changes one at a time and set reasonable goals to increase your likelihood of success.

## 2016-06-30 NOTE — Progress Notes (Signed)
Patient ID: Joe Green, male   DOB: 24-Apr-1951, 65 y.o.   MRN: [id number]  Assessment and Plan:  Hypertension:  -Continue medication -monitor blood pressure at home. -Continue DASH diet -Reminder to go to the ER if any CP, SOB, nausea, dizziness, severe HA, changes vision/speech, left arm numbness and tingling and jaw pain.  Cholesterol - Continue diet and exercise -Check cholesterol.   Diabetes without complications -Continue diet and exercise.  -Check A1C  Vitamin D Def -check level -continue medications.   Smoking -  instruction/counseling given, counseled patient on the dangers of tobacco use, advised patient to stop smoking, and reviewed strategies to maximize success, will discuss chantix next OV  COPD Advised to stop smoking, tessalon PRN Will try breo once daily  Continue diet and meds as discussed. Further disposition pending results of labs. Discussed med's effects and SE's.   Future Appointments Date Time Provider [place]  11/21/2016 9:00 AM Vicie Mutters, PA-C [id number] None    HPI 65 y.o. male  presents for 3 month follow up with hypertension, hyperlipidemia, diabetes and vitamin D deficiency.   His blood pressure has been controlled at home, today their BP is BP: 134/72.He does workout. He denies chest pain, shortness of breath, dizziness. He is a smoker, he would like to quit.    He is on cholesterol medication and denies myalgias. His cholesterol is at goal. The cholesterol was:  03/24/2016: Cholesterol 109; HDL 42; LDL Cholesterol 48; Triglycerides 94   He has been working on diet and exercise for diabetes without complications, he is on bASA, he is on ACE/ARB, and denies  foot ulcerations, hyperglycemia, hypoglycemia , increased appetite, nausea, paresthesia of the feet, polydipsia, polyuria, visual disturbances, vomiting and weight loss. He is on MF and farxiga, he checks his sugars 2-3 x a week, sugars are 130 in the monring. Last A1C was:  03/24/2016: Hgb A1c MFr Bld 7.0   Patient is on Vitamin D supplement. 03/24/2016: Vit D, 25-Hydroxy 71   BMI is Body mass index is 28.8 kg/m., he is working on diet and exercise. Wt Readings from Last 3 Encounters:  06/30/16 195 lb (88.5 kg)  03/24/16 193 lb 3.2 oz (87.6 kg)  11/18/15 192 lb 6.4 oz (87.3 kg)    Current Medications:  Current Outpatient Prescriptions on File Prior to Visit  Medication Sig Dispense Refill  . aspirin EC 325 MG tablet Take by mouth.    . benzonatate (TESSALON) 200 MG capsule Take 1 capsule (200 mg total) by mouth 3 (three) times daily as needed for cough. 60 capsule 1  . Cholecalciferol (VITAMIN D PO) Take 5,000 mg by mouth daily.    . dapagliflozin propanediol (FARXIGA) 10 MG TABS tablet Take 10 mg by mouth daily. 90 tablet 1  . doxazosin (CARDURA) 8 MG tablet TAKE ONE TABLET BY MOUTH ONCE DAILY 90 tablet 1  . metFORMIN (GLUCOPHAGE) 1000 MG tablet TAKE ONE TABLET BY MOUTH TWICE DAILY 180 tablet 1  . ramipril (ALTACE) 5 MG capsule TAKE ONE CAPSULE BY MOUTH ONCE DAILY 90 capsule 1  . rosuvastatin (CRESTOR) 20 MG tablet TAKE ONE TABLET BY MOUTH ONCE DAILY 90 tablet 1   No current facility-administered medications on file prior to visit.    Medical History:  Past Medical History:  Diagnosis Date  . CAD (coronary artery disease)    a. s/p stent to RCA 2005; b. [place] 2006 after Nuc with inf ischemia: patent RCA stent, D1 80%, LAD 60% => med Rx;  c. LHC 04/18/11: Proximal LAD 60% (unchanged from previous catheterization), distal LAD 65% (small caliber), Dx 60%, proximal CFX 20%, mid CFX 50%, OM1 30%, proximal RCA 20%, mid RCA stent patent, EF 65%.=> med Rx  . GERD (gastroesophageal reflux disease)   . HLD (hyperlipidemia)   . Hypertension   . Obesity   . OSA (obstructive sleep apnea)    has a cpap-does not use-has a special mouthpiece he wears at night  . Type II or unspecified type diabetes mellitus without mention of complication, not stated as uncontrolled    . Vitamin D deficiency   . Wears glasses    Allergies: No Known Allergies   Review of Systems:  Review of Systems  Constitutional: Negative for chills, fever and malaise/fatigue.  HENT: Negative for congestion, ear pain and sore throat.   Respiratory: Negative for cough, shortness of breath and wheezing.   Cardiovascular: Negative for chest pain, palpitations and leg swelling.  Gastrointestinal: Negative for blood in stool, constipation, diarrhea, heartburn and melena.  Genitourinary: Negative.   Neurological: Negative for dizziness, sensory change, loss of consciousness and headaches.  Psychiatric/Behavioral: Negative for depression. The patient is not nervous/anxious and does not have insomnia.     Family history- Review and unchanged  Social history- Review and unchanged  Physical Exam: BP 134/72   Pulse 78   Temp 98.2 F (36.8 C) (Temporal)   Resp 16   Ht 5\' 9"  (1.753 m)   Wt 195 lb (88.5 kg)   BMI 28.80 kg/m  Wt Readings from Last 3 Encounters:  06/30/16 195 lb (88.5 kg)  03/24/16 193 lb 3.2 oz (87.6 kg)  11/18/15 192 lb 6.4 oz (87.3 kg)   General Appearance: Well nourished well developed, non-toxic appearing, in no apparent distress. Eyes: PERRLA, EOMs, conjunctiva no swelling or erythema ENT/Mouth: Ear canals clear with no erythema, swelling, or discharge.  TMs normal bilaterally, oropharynx clear, moist, with no exudate.   Neck: Supple, thyroid normal, no JVD, no cervical adenopathy.  Respiratory: Respiratory effort normal, breath sounds clear A&P, no wheeze, rhonchi or rales noted.  No retractions, no accessory muscle usage Cardio: RRR with no MRGs. No noted edema.  Abdomen: Soft, + BS.  Non tender, no guarding, rebound, hernias, masses. Musculoskeletal: Full ROM, 5/5 strength, Normal gait Skin: Warm, dry without rashes, lesions, ecchymosis.  Neuro: Awake and oriented X 3, Cranial nerves intact. No cerebellar symptoms.  Psych: normal affect, Insight and  Judgment appropriate.    Vicie Mutters, PA-C 10:10 AM [facility]

## 2016-07-01 LAB — HEPATIC FUNCTION PANEL
ALBUMIN: 4.4 g/dL (ref 3.6–5.1)
ALT: 18 U/L (ref 9–46)
AST: 14 U/L (ref 10–35)
Alkaline Phosphatase: 90 U/L (ref 40–115)
Bilirubin, Direct: 0.2 mg/dL (ref <=0.2)
Indirect Bilirubin: 0.5 mg/dL (ref 0.2–1.2)
TOTAL PROTEIN: 6.8 g/dL (ref 6.1–8.1)
Total Bilirubin: 0.7 mg/dL (ref 0.2–1.2)

## 2016-07-01 LAB — BASIC METABOLIC PANEL WITH GFR
BUN: 15 mg/dL (ref 7–25)
CALCIUM: 9.5 mg/dL (ref 8.6–10.3)
CO2: 22 mmol/L (ref 20–31)
CREATININE: 0.72 mg/dL (ref 0.70–1.25)
Chloride: 102 mmol/L (ref 98–110)
GFR, Est Non African American: >89 mL/min (ref >=60)
GLUCOSE: 93 mg/dL (ref 65–99)
Potassium: 4.4 mmol/L (ref 3.5–5.3)
Sodium: 137 mmol/L (ref 135–146)

## 2016-07-01 LAB — LIPID PANEL
CHOLESTEROL: 121 mg/dL (ref <200)
HDL: 42 mg/dL (ref >40)
LDL CALC: 58 mg/dL (ref <100)
Total CHOL/HDL Ratio: 2.9 Ratio (ref <5.0)
Triglycerides: 106 mg/dL (ref <150)
VLDL: 21 mg/dL (ref <30)

## 2016-07-01 LAB — MAGNESIUM: MAGNESIUM: 2 mg/dL (ref 1.5–2.5)

## 2016-08-12 ENCOUNTER — Other Ambulatory Visit: Payer: Self-pay | Admitting: Physician Assistant

## 2016-08-12 DIAGNOSIS — E119 Type 2 diabetes mellitus without complications: Secondary | ICD-10-CM

## 2016-09-13 ENCOUNTER — Encounter: Payer: Self-pay | Admitting: Gastroenterology

## 2016-10-13 ENCOUNTER — Other Ambulatory Visit: Payer: Self-pay | Admitting: Family Medicine

## 2016-10-13 DIAGNOSIS — M25561 Pain in right knee: Secondary | ICD-10-CM

## 2016-10-13 DIAGNOSIS — Z77018 Contact with and (suspected) exposure to other hazardous metals: Secondary | ICD-10-CM

## 2016-10-20 ENCOUNTER — Other Ambulatory Visit: Payer: BLUE CROSS/BLUE SHIELD

## 2016-10-20 ENCOUNTER — Other Ambulatory Visit: Payer: Self-pay | Admitting: Physician Assistant

## 2016-11-03 ENCOUNTER — Ambulatory Visit
Admission: RE | Admit: 2016-11-03 | Discharge: 2016-11-03 | Disposition: A | Discharge status: Home / Self Care | Payer: 59 | Source: Ambulatory Visit | Attending: Family Medicine | Admitting: Family Medicine

## 2016-11-03 DIAGNOSIS — M25561 Pain in right knee: Secondary | ICD-10-CM

## 2016-11-03 DIAGNOSIS — Z77018 Contact with and (suspected) exposure to other hazardous metals: Secondary | ICD-10-CM

## 2016-11-07 ENCOUNTER — Telehealth: Payer: Self-pay | Admitting: Cardiovascular Disease

## 2016-11-07 NOTE — Telephone Encounter (Signed)
I think he should stay with Dr. Johnsie Cancel

## 2016-11-07 NOTE — Telephone Encounter (Signed)
New message     Pt is requesting permission to switch from Dr Johnsie Cancel to Fransico Him as Cardiologist

## 2016-11-07 NOTE — Telephone Encounter (Signed)
Ok with me 

## 2016-11-16 ENCOUNTER — Encounter: Payer: Self-pay | Admitting: Cardiology

## 2016-11-16 ENCOUNTER — Encounter (INDEPENDENT_AMBULATORY_CARE_PROVIDER_SITE_OTHER): Payer: 59 | Admitting: Cardiology

## 2016-11-16 NOTE — Progress Notes (Deleted)
Cardiology Office Note    Date:  11/16/2016   ID:  Joe Green, DOB June 30, 1951, MRN [id number]  PCP:  Alesia Richards, MD  Cardiologist:  Fransico Him, MD   No chief complaint on file.   History of Present Illness:  Joe Green is a 66 y.o. male with ah history of     Past Medical History:  Diagnosis Date  . CAD (coronary artery disease)    a. s/p stent to RCA 2005; b. [facility] 2006 after Nuc with inf ischemia: patent RCA stent, D1 80%, LAD 60% => med Rx;  c. LHC 04/18/11: Proximal LAD 60% (unchanged from previous catheterization), distal LAD 65% (small caliber), Dx 60%, proximal CFX 20%, mid CFX 50%, OM1 30%, proximal RCA 20%, mid RCA stent patent, EF 65%.=> med Rx  . GERD (gastroesophageal reflux disease)   . HLD (hyperlipidemia)   . Hypertension   . Obesity   . OSA (obstructive sleep apnea)    has a cpap-does not use-has a special mouthpiece he wears at night  . Type II or unspecified type diabetes mellitus without mention of complication, not stated as uncontrolled   . Vitamin D deficiency   . Wears glasses     Past Surgical History:  Procedure Laterality Date  . CARDIAC CATHETERIZATION  2005   stent  . COLONOSCOPY  11/2011   Due 5 years Dr. Fuller Plan  . heart stent  2005   RCA  . INGUINAL HERNIA REPAIR Right 11/16/2014   Procedure: RIGHT INGUINAL HERNIA REPAIR WITH MESH;  Surgeon: Armandina Gemma, MD;  Location: [facility];  Service: General;  Laterality: Right;  . INSERTION OF MESH Right 11/16/2014   Procedure: INSERTION OF MESH;  Surgeon: Armandina Gemma, MD;  Location: [facility];  Service: General;  Laterality: Right;  . POLYPECTOMY      Current Medications: Current Meds  Medication Sig  . aspirin EC 325 MG tablet Take by mouth.  . benzonatate (TESSALON) 200 MG capsule Take 1 capsule (200 mg total) by mouth 3 (three) times daily as needed for cough.  . Cholecalciferol (VITAMIN D PO) Take 5,000 mg by mouth daily.  Marland Kitchen doxazosin  (CARDURA) 8 MG tablet TAKE ONE TABLET BY MOUTH ONCE DAILY  . FARXIGA 10 MG TABS tablet TAKE ONE TABLET BY MOUTH ONCE DAILY  . fluticasone furoate-vilanterol (BREO ELLIPTA) 100-25 MCG/INH AEPB Inhale 1 puff into the lungs daily. Rinse mouth with water after each use  . metFORMIN (GLUCOPHAGE) 1000 MG tablet TAKE ONE TABLET BY MOUTH TWICE DAILY  . ramipril (ALTACE) 5 MG capsule TAKE ONE CAPSULE BY MOUTH ONCE DAILY  . rosuvastatin (CRESTOR) 20 MG tablet TAKE ONE TABLET BY MOUTH ONCE DAILY    Allergies:   Patient has no known allergies.   Social History   Social History  . Marital status: Married    Spouse name: N/A  . Number of children: N/A  . Years of education: N/A   Social History Main Topics  . Smoking status: Current Every Day Smoker    Packs/day: 0.50    Years: 40.00    Types: Cigarettes  . Smokeless tobacco: Never Used  . Alcohol use Yes     Comment: 2-3 beers most nights  . Drug use: No  . Sexual activity: Not Asked   Other Topics Concern  . None   Social History Narrative  . None     Family History:  The patient's ***family history includes Hypertension in his father;  Prostate cancer in his father; Stroke in his father.   ROS:   Please see the history of present illness.    ROS All other systems reviewed and are negative.  No flowsheet data found.     PHYSICAL EXAM:   VS:  BP 118/62   Pulse 66   Ht 5\' 9"  (1.753 m)   Wt 197 lb 12.8 oz (89.7 kg)   BMI 29.21 kg/m    GEN: Well nourished, well developed, in no acute distress  HEENT: normal  Neck: no JVD, carotid bruits, or masses Cardiac: ***RRR; no murmurs, rubs, or gallops,no edema.  Intact distal pulses bilaterally.  Respiratory:  clear to auscultation bilaterally, normal work of breathing GI: soft, nontender, nondistended, + BS MS: no deformity or atrophy  Skin: warm and dry, no rash Neuro:  Alert and Oriented x 3, Strength and sensation are intact Psych: euthymic mood, full affect  Wt Readings  from Last 3 Encounters:  11/16/16 197 lb 12.8 oz (89.7 kg)  06/30/16 195 lb (88.5 kg)  03/24/16 193 lb 3.2 oz (87.6 kg)      Studies/Labs Reviewed:   EKG:  EKG is*** ordered today.  The ekg ordered today demonstrates ***  Recent Labs: 06/30/2016: ALT 18; BUN 15; Creat 0.72; Hemoglobin 16.2; Magnesium 2.0; Platelets 161; Potassium 4.4; Sodium 137; TSH 1.03   Lipid Panel    Component Value Date/Time   CHOL 121 06/30/2016 1027   TRIG 106 06/30/2016 1027   HDL 42 06/30/2016 1027   CHOLHDL 2.9 06/30/2016 1027   VLDL 21 06/30/2016 1027   LDLCALC 58 06/30/2016 1027    Additional studies/ records that were reviewed today include:  ***    ASSESSMENT:    No diagnosis found.   PLAN:  In order of problems listed above:  1. ***    Medication Adjustments/Labs and Tests Ordered: Current medicines are reviewed at length with the patient today.  Concerns regarding medicines are outlined above.  Medication changes, Labs and Tests ordered today are listed in the Patient Instructions below.  There are no Patient Instructions on file for this visit.   Signed, Fransico Him, MD  11/16/2016 9:18 AM    [facility] [address], [place], [place]  [address] Phone: [phone]; Fax: [phone]

## 2016-11-17 ENCOUNTER — Ambulatory Visit (INDEPENDENT_AMBULATORY_CARE_PROVIDER_SITE_OTHER): Payer: 59 | Admitting: Physician Assistant

## 2016-11-17 ENCOUNTER — Encounter: Payer: Self-pay | Admitting: Physician Assistant

## 2016-11-17 ENCOUNTER — Telehealth: Payer: Self-pay

## 2016-11-17 ENCOUNTER — Encounter (INDEPENDENT_AMBULATORY_CARE_PROVIDER_SITE_OTHER): Payer: Self-pay

## 2016-11-17 VITALS — BP 136/78 | HR 85 | Ht 70.0 in | Wt 196.8 lb

## 2016-11-17 DIAGNOSIS — Z720 Tobacco use: Secondary | ICD-10-CM | POA: Diagnosis not present

## 2016-11-17 DIAGNOSIS — I10 Essential (primary) hypertension: Secondary | ICD-10-CM

## 2016-11-17 DIAGNOSIS — G4733 Obstructive sleep apnea (adult) (pediatric): Secondary | ICD-10-CM

## 2016-11-17 DIAGNOSIS — E118 Type 2 diabetes mellitus with unspecified complications: Secondary | ICD-10-CM | POA: Diagnosis not present

## 2016-11-17 DIAGNOSIS — E785 Hyperlipidemia, unspecified: Secondary | ICD-10-CM | POA: Diagnosis not present

## 2016-11-17 DIAGNOSIS — I25119 Atherosclerotic heart disease of native coronary artery with unspecified angina pectoris: Secondary | ICD-10-CM | POA: Diagnosis not present

## 2016-11-17 MED ORDER — ASPIRIN EC 81 MG PO TBEC: 81.0000 mg | DELAYED_RELEASE_TABLET | Freq: Every day | ORAL | 3 refills | Status: DC

## 2016-11-17 NOTE — Progress Notes (Signed)
This encounter was created in error - please disregard. This encounter was created in error - please disregard. This encounter was created in error - please disregard. 

## 2016-11-17 NOTE — Telephone Encounter (Signed)
Late entry:  Patient was in the office yesterday, 4/5 for OV with Dr. Radford Pax.  Per Dr. Radford Pax, patient was incorrectly scheduled as he is a patient of Dr. Johnsie Cancel since Dr. Radford Pax did not approve provider switch (see 3/27 phone note).  Con Memos Suits spoke to the patient at [place] time, took him to check-in, got his appointment cancelled and his copay refunded.   Today, K. Grandville Silos, [place] called Nursing for clarification of what occurred yesterday. Explained above to [place] as she stated patient seemed confused when he had an OV with her today.   Valetta Fuller would like Con Memos to call patient to reiterate to him that there are protocols to switch providers and Dr. Radford Pax didn't just refuse to see the patient because she doesn't like him or didn't want to treat him.  To Georgana Curio to follow-up with patient.

## 2016-11-17 NOTE — Progress Notes (Addendum)
Cardiology Office Note    Date:  11/17/2016   ID:  [name], DOB 1950-12-19, MRN [id number]  PCP:  Alesia Richards, MD  Cardiologist:  Dr. Johnsie Cancel  CC: chest pains and follow up  History of Present Illness:  Joe Green is a 66 y.o. male with a history of CAD s/p stent to RCA in 2005, DM2, HTN, HLD, OSA non compliant with CPAP but wears mouth piece, ongoing tobacco abuse and RBBB who presents to clinic for evaluation of chest pain.   He has a history of CAD s/p PCI to RCA in 2005. He underwent LHC in 2006 after nuc showed inf ischemia which showed patent RCA stent, D1 80%, and LAD 60%. Medical therapy was recommended.  He underwent LHC in 04/18/11 which showed a proximal LAD 60% (unchanged from previous catheterization), distal LAD 65% (small caliber), Dx 60%, proximal CFX 20%, mid CFX 50%, OM1 30%, proximal RCA 20%, mid RCA stent patent, EF 65%. Medical therapy recommended.   He has done pretty well since last heart cath in 2012. He last saw Dr. Johnsie Cancel in 05/2014.   Today he presents to clinic for follow up. No SOB, but has had some twinges in his chest. They are described as a dull pressure that radiates to his shoulder blade. It last minutes and then self resolves. Not necessarily related to exertion. He is supposed to have knee surgery next month and wanted to get checked out before that. No LE edema, orthopnea or PND. No dizziness or syncope. No blood in stool or urine. No palpitations. He is down to 5-7 cigs a day but not been able to quit. He is interested in chantix and is going to discuss it with his PCP next week at annual visit. 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 He does not formally exercise.    Past Medical History:  Diagnosis Date  . CAD (coronary artery disease)    a. s/p stent to RCA 2005; b. LHC 2006 after Nuc with inf ischemia: patent RCA stent, D1 80%, LAD 60% => med Rx;  c. LHC 04/18/11: Proximal LAD 60% (unchanged from previous catheterization),  distal LAD 65% (small caliber), Dx 60%, proximal CFX 20%, mid CFX 50%, OM1 30%, proximal RCA 20%, mid RCA stent patent, EF 65%.=> med Rx  . GERD (gastroesophageal reflux disease)   . HLD (hyperlipidemia)   . Hypertension   . Obesity   . OSA (obstructive sleep apnea)    has a cpap-does not use-has a special mouthpiece he wears at night  . Type II or unspecified type diabetes mellitus without mention of complication, not stated as uncontrolled   . Vitamin D deficiency   . Wears glasses     Past Surgical History:  Procedure Laterality Date  . CARDIAC CATHETERIZATION  2005   stent  . COLONOSCOPY  11/2011   Due 5 years Dr. Fuller Plan  . heart stent  2005   RCA  . INGUINAL HERNIA REPAIR Right 11/16/2014   Procedure: RIGHT INGUINAL HERNIA REPAIR WITH MESH;  Surgeon: Armandina Gemma, MD;  Location: [facility];  Service: General;  Laterality: Right;  . INSERTION OF MESH Right 11/16/2014   Procedure: INSERTION OF MESH;  Surgeon: Armandina Gemma, MD;  Location: [facility];  Service: General;  Laterality: Right;  . POLYPECTOMY      Current Medications: Outpatient Medications Prior to Visit  Medication Sig Dispense Refill  . Cholecalciferol (VITAMIN D PO) Take 5,000  mg by mouth daily.    Marland Kitchen doxazosin (CARDURA) 8 MG tablet TAKE ONE TABLET BY MOUTH ONCE DAILY 90 tablet 1  . FARXIGA 10 MG TABS tablet TAKE ONE TABLET BY MOUTH ONCE DAILY 90 tablet 1  . fluticasone furoate-vilanterol (BREO ELLIPTA) 100-25 MCG/INH AEPB Inhale 1 puff into the lungs daily. Rinse mouth with water after each use 1 each 3  . metFORMIN (GLUCOPHAGE) 1000 MG tablet TAKE ONE TABLET BY MOUTH TWICE DAILY 180 tablet 1  . ramipril (ALTACE) 5 MG capsule TAKE ONE CAPSULE BY MOUTH ONCE DAILY 90 capsule 1  . rosuvastatin (CRESTOR) 20 MG tablet TAKE ONE TABLET BY MOUTH ONCE DAILY 90 tablet 1  . aspirin EC 325 MG tablet Take 81 mg by mouth daily.    . benzonatate (TESSALON) 200 MG capsule Take 1 capsule (200 mg total) by  mouth 3 (three) times daily as needed for cough. 60 capsule 1   No facility-administered medications prior to visit.      Allergies:   Patient has no known allergies.   Social History   Social History  . Marital status: Married    Spouse name: N/A  . Number of children: N/A  . Years of education: N/A   Social History Main Topics  . Smoking status: Current Every Day Smoker    Packs/day: 0.50    Years: 40.00    Types: Cigarettes  . Smokeless tobacco: Never Used  . Alcohol use Yes     Comment: 2-3 beers most nights  . Drug use: No  . Sexual activity: Not Asked   Other Topics Concern  . None   Social History Narrative  . None     Family History:  The patient's family history includes Hypertension in his father; Prostate cancer in his father; Stroke in his father.      ROS:   Please see the history of present illness.    ROS All other systems reviewed and are negative.   PHYSICAL EXAM:   VS:  BP 136/78   Pulse 85   Ht 5\' 10"  (1.778 m)   Wt 196 lb 12 oz (89.2 kg)   SpO2 96%   BMI 28.23 kg/m    GEN: Well nourished, well developed, in no acute distress  HEENT: normal  Neck: no JVD, carotid bruits, or masses Cardiac: RRR; no murmurs, rubs, or gallops,no edema  Respiratory:  clear to auscultation bilaterally, normal work of breathing GI: soft, nontender, nondistended, + BS MS: no deformity or atrophy  Skin: warm and dry, no rash Neuro:  Alert and Oriented x 3, Strength and sensation are intact Psych: euthymic mood, full affect    Wt Readings from Last 3 Encounters:  11/17/16 196 lb 12 oz (89.2 kg)  11/16/16 197 lb 12.8 oz (89.7 kg)  06/30/16 195 lb (88.5 kg)      Studies/Labs Reviewed:   EKG:  EKG is ordered today.  The ekg ordered today demonstrates NSR, RBBB HR 85  Recent Labs: 06/30/2016: ALT 18; BUN 15; Creat 0.72; Hemoglobin 16.2; Magnesium 2.0; Platelets 161; Potassium 4.4; Sodium 137; TSH 1.03   Lipid Panel    Component Value Date/Time    CHOL 121 06/30/2016 1027   TRIG 106 06/30/2016 1027   HDL 42 06/30/2016 1027   CHOLHDL 2.9 06/30/2016 1027   VLDL 21 06/30/2016 1027   LDLCALC 58 06/30/2016 1027    Additional studies/ records that were reviewed today include:  Carotid artery dopplers 03/13/2011 IMPRESSION: 1. No significant plaque  accumulation or stenosis.  Cath 2012 IMPRESSION: 1. Stable triple-vessel coronary artery disease. 2. Normal left ventricular systolic function.  RECOMMENDATIONS:  At this time, I recommend continued medical management.  I do not think that any of the patient's lesions have progressed in a manner that would indicate a need for percutaneous intervention.  The patient did not have any anterior wall ischemia on his Myoview.   ASSESSMENT & PLAN:   Chest pain:a little atypical and not always related to exertion,  but with his history of CAD and upcoming knee surgery with Dr. Berenice Primas, will get a myoivew.   CAD: will decrease ASA 325mg  to 81mg  daily. Continue statin. We discussed adding a BB but he is reluctant at this time.   HTN: BP well controlled today  HLD: LDL excellent at 58 and LFTs normal in 06/2016. Continue Crestor. Lipids followed by his PCP  DMT2: most recent HgA1c 7.1. Continue current reigmen  OSA: complaint with mouth piece but cannot tolerate mask.   Tobacco abuse: down to 6 cigs. He is going to talk about Chantix with his PCP next week. I strongly encouraged this   Medication Adjustments/Labs and Tests Ordered: Current medicines are reviewed at length with the patient today.  Concerns regarding medicines are outlined above.  Medication changes, Labs and Tests ordered today are listed in the Patient Instructions below. Patient Instructions  Medication Instructions:  Your physician has recommended you make the following change in your medication:  1.  STOP the Aspirin 325 mg  2.  START the Aspirin 81 mg taking 1 tablet daily    Labwork: None  ordered  Testing/Procedures: Your physician has requested that you have a lexiscan myoview. For further information please visit [email or web address]. Please follow instruction sheet, as given.   Follow-Up: Your physician recommends that you schedule a follow-up appointment in: [place], PA-C   Any Other Special Instructions Will Be Listed Below (If Applicable).  Pharmacologic Stress Electrocardiogram Introduction A pharmacologic stress electrocardiogram is a heart (cardiac) test that uses nuclear imaging to evaluate the blood supply to your heart. This test may also be called a pharmacologic stress electrocardiography. Pharmacologic means that a medicine is used to increase your heart rate and blood pressure. This stress test is done to find areas of poor blood flow to the heart by determining the extent of coronary artery disease (CAD). Some people exercise on a treadmill, which naturally increases the blood flow to the heart. For those people unable to exercise on a treadmill, a medicine is used. This medicine stimulates your heart and will cause your heart to beat harder and more quickly, as if you were exercising. Pharmacologic stress tests can help determine:  The adequacy of blood flow to your heart during increased levels of activity in order to clear you for discharge home.  The extent of coronary artery blockage caused by CAD.  Your prognosis if you have suffered a heart attack.  The effectiveness of cardiac procedures done, such as an angioplasty, which can increase the circulation in your coronary arteries.  Causes of chest pain or pressure. LET [facility] CARE PROVIDER KNOW ABOUT:  Any allergies you have.  All medicines you are taking, including vitamins, herbs, eye drops, creams, and over-the-counter medicines.  Previous problems you or members of your family have had with the use of anesthetics.  Any blood disorders you have.  Previous surgeries  you have had.  Medical conditions you have.  Possibility of pregnancy, if this applies.  If you are currently breastfeeding. RISKS AND COMPLICATIONS Generally, this is a safe procedure. However, as with any procedure, complications can occur. Possible complications include:  You develop pain or pressure in the following areas:  Chest.  Jaw or neck.  Between your shoulder blades.  Radiating down your left arm.  Headache.  Dizziness or light-headedness.  Shortness of breath.  Increased or irregular heartbeat.  Low blood pressure.  Nausea or vomiting.  Flushing.  Redness going up the arm and slight pain during injection of medicine.  Heart attack (rare). BEFORE THE PROCEDURE  Avoid all forms of caffeine for 24 hours before your test or as directed by your health care provider. This includes coffee, tea (even decaffeinated tea), caffeinated sodas, chocolate, cocoa, and certain pain medicines.  Follow your health care provider's instructions regarding eating and drinking before the test.  Take your medicines as directed at regular times with water unless instructed otherwise. Exceptions may include:  If you have diabetes, ask how you are to take your insulin or pills. It is common to adjust insulin dosing the morning of the test.  If you are taking beta-blocker medicines, it is important to talk to your health care provider about these medicines well before the date of your test. Taking beta-blocker medicines may interfere with the test. In some cases, these medicines need to be changed or stopped 24 hours or more before the test.  If you wear a nitroglycerin patch, it may need to be removed prior to the test. Ask your health care provider if the patch should be removed before the test.  If you use an inhaler for any breathing condition, bring it with you to the test.  If you are an outpatient, bring a snack so you can eat right after the stress phase of the  test.  Do not smoke for 4 hours prior to the test or as directed by your health care provider.  Do not apply lotions, powders, creams, or oils on your chest prior to the test.  Wear comfortable shoes and clothing. Let your health care provider know if you were unable to complete or follow the preparations for your test. PROCEDURE  Multiple patches (electrodes) will be put on your chest. If needed, small areas of your chest may be shaved to get better contact with the electrodes. Once the electrodes are attached to your body, multiple wires will be attached to the electrodes, and your heart rate will be monitored.  An IV access will be started. A nuclear trace (isotope) is given. The isotope may be given intravenously, or it may be swallowed. Nuclear refers to several types of radioactive isotopes, and the nuclear isotope lights up the arteries so that the nuclear images are clear. The isotope is absorbed by your body. This results in low radiation exposure.  A resting nuclear image is taken to show how your heart functions at rest.  A medicine is given through the IV access.  A second scan is done about 1 hour after the medicine injection and determines how your heart functions under stress.  During this stress phase, you will be connected to an electrocardiogram machine. Your blood pressure and oxygen levels will be monitored. What to expect after the procedure  Your heart rate and blood pressure will be monitored after the test.  You may return to your normal schedule, including diet,activities, and medicines, unless your health care provider tells you otherwise. This information is not intended to replace advice given to  you by your health care provider. Make sure you discuss any questions you have with your health care provider. Document Released: 12/17/2008 Document Revised: 01/06/2016 Document Reviewed: 02/07/2016 Elsevier Interactive Patient Education  2017 [employer].     If you need a refill on your cardiac medications before your next appointment, please call your pharmacy.      Signed, Angelena Form, PA-C  11/17/2016 4:22 PM    [facility] [place], [place], [place]  [address] Phone: [phone]; Fax: [phone]

## 2016-11-17 NOTE — Patient Instructions (Signed)
Medication Instructions:  Your physician has recommended you make the following change in your medication:  1.  STOP the Aspirin 325 mg  2.  START the Aspirin 81 mg taking 1 tablet daily    Labwork: None ordered  Testing/Procedures: Your physician has requested that you have a lexiscan myoview. For further information please visit [email or web address]. Please follow instruction sheet, as given.   Follow-Up: Your physician recommends that you schedule a follow-up appointment in: [place], PA-C   Any Other Special Instructions Will Be Listed Below (If Applicable).  Pharmacologic Stress Electrocardiogram Introduction A pharmacologic stress electrocardiogram is a heart (cardiac) test that uses nuclear imaging to evaluate the blood supply to your heart. This test may also be called a pharmacologic stress electrocardiography. Pharmacologic means that a medicine is used to increase your heart rate and blood pressure. This stress test is done to find areas of poor blood flow to the heart by determining the extent of coronary artery disease (CAD). Some people exercise on a treadmill, which naturally increases the blood flow to the heart. For those people unable to exercise on a treadmill, a medicine is used. This medicine stimulates your heart and will cause your heart to beat harder and more quickly, as if you were exercising. Pharmacologic stress tests can help determine:  The adequacy of blood flow to your heart during increased levels of activity in order to clear you for discharge home.  The extent of coronary artery blockage caused by CAD.  Your prognosis if you have suffered a heart attack.  The effectiveness of cardiac procedures done, such as an angioplasty, which can increase the circulation in your coronary arteries.  Causes of chest pain or pressure. LET [facility] CARE PROVIDER KNOW ABOUT:  Any allergies you have.  All medicines you are taking, including  vitamins, herbs, eye drops, creams, and over-the-counter medicines.  Previous problems you or members of your family have had with the use of anesthetics.  Any blood disorders you have.  Previous surgeries you have had.  Medical conditions you have.  Possibility of pregnancy, if this applies.  If you are currently breastfeeding. RISKS AND COMPLICATIONS Generally, this is a safe procedure. However, as with any procedure, complications can occur. Possible complications include:  You develop pain or pressure in the following areas:  Chest.  Jaw or neck.  Between your shoulder blades.  Radiating down your left arm.  Headache.  Dizziness or light-headedness.  Shortness of breath.  Increased or irregular heartbeat.  Low blood pressure.  Nausea or vomiting.  Flushing.  Redness going up the arm and slight pain during injection of medicine.  Heart attack (rare). BEFORE THE PROCEDURE  Avoid all forms of caffeine for 24 hours before your test or as directed by your health care provider. This includes coffee, tea (even decaffeinated tea), caffeinated sodas, chocolate, cocoa, and certain pain medicines.  Follow your health care provider's instructions regarding eating and drinking before the test.  Take your medicines as directed at regular times with water unless instructed otherwise. Exceptions may include:  If you have diabetes, ask how you are to take your insulin or pills. It is common to adjust insulin dosing the morning of the test.  If you are taking beta-blocker medicines, it is important to talk to your health care provider about these medicines well before the date of your test. Taking beta-blocker medicines may interfere with the test. In some cases, these medicines need to be changed or stopped  24 hours or more before the test.  If you wear a nitroglycerin patch, it may need to be removed prior to the test. Ask your health care provider if the patch should be  removed before the test.  If you use an inhaler for any breathing condition, bring it with you to the test.  If you are an outpatient, bring a snack so you can eat right after the stress phase of the test.  Do not smoke for 4 hours prior to the test or as directed by your health care provider.  Do not apply lotions, powders, creams, or oils on your chest prior to the test.  Wear comfortable shoes and clothing. Let your health care provider know if you were unable to complete or follow the preparations for your test. PROCEDURE  Multiple patches (electrodes) will be put on your chest. If needed, small areas of your chest may be shaved to get better contact with the electrodes. Once the electrodes are attached to your body, multiple wires will be attached to the electrodes, and your heart rate will be monitored.  An IV access will be started. A nuclear trace (isotope) is given. The isotope may be given intravenously, or it may be swallowed. Nuclear refers to several types of radioactive isotopes, and the nuclear isotope lights up the arteries so that the nuclear images are clear. The isotope is absorbed by your body. This results in low radiation exposure.  A resting nuclear image is taken to show how your heart functions at rest.  A medicine is given through the IV access.  A second scan is done about 1 hour after the medicine injection and determines how your heart functions under stress.  During this stress phase, you will be connected to an electrocardiogram machine. Your blood pressure and oxygen levels will be monitored. What to expect after the procedure  Your heart rate and blood pressure will be monitored after the test.  You may return to your normal schedule, including diet,activities, and medicines, unless your health care provider tells you otherwise. This information is not intended to replace advice given to you by your health care provider. Make sure you discuss any  questions you have with your health care provider. Document Released: 12/17/2008 Document Revised: 01/06/2016 Document Reviewed: 02/07/2016 Elsevier Interactive Patient Education  2017 [employer].     If you need a refill on your cardiac medications before your next appointment, please call your pharmacy.

## 2016-11-20 NOTE — Telephone Encounter (Signed)
11/20/2016 1600  Call to patient after seeing request from Angelena Form PA.  Pt was in the office on 11/16/2016 to see Dr Radford Pax.  Pt had previously seen Dr Johnsie Cancel and asked to see Dr Radford Pax.  He explained to Probation officer he knew Dr Radford Pax from [place] and wanted another opinion.  Dr Radford Pax had reviewed chart and agreed with plan of care therefore did not see a reason for patient to change providers. Appointment was scheduled and not canceled with Dr Radford Pax as should have happened after Dr Radford Pax did not agree to accept the patient. Writer was called to explain to patient who stated he was fine with rescheduling either with Dr Johnsie Cancel or an APP, stated he had some intermittant dull shoulder pain he wanted evaluated.  Next available appt on 11/17/2016 with Angelena Form scheduled.  Writer contacted patient today to clarify why Dr Radford Pax didn't see him last week.  Patient verbalized understanding, stated he understood why he was rescheduled to see an APP. Patient explained he had an upcoming stress test and had not additional concerns.  Georgana Curio MHA RN CCM

## 2016-11-21 ENCOUNTER — Telehealth (HOSPITAL_COMMUNITY): Payer: Self-pay | Admitting: *Deleted

## 2016-11-21 ENCOUNTER — Ambulatory Visit (INDEPENDENT_AMBULATORY_CARE_PROVIDER_SITE_OTHER): Payer: 59 | Admitting: Physician Assistant

## 2016-11-21 ENCOUNTER — Encounter: Payer: Self-pay | Admitting: Physician Assistant

## 2016-11-21 VITALS — BP 132/80 | HR 74 | Temp 97.5°F | Resp 16 | Ht 70.0 in | Wt 195.4 lb

## 2016-11-21 DIAGNOSIS — Z23 Encounter for immunization: Secondary | ICD-10-CM | POA: Diagnosis not present

## 2016-11-21 DIAGNOSIS — Z0000 Encounter for general adult medical examination without abnormal findings: Secondary | ICD-10-CM | POA: Diagnosis not present

## 2016-11-21 DIAGNOSIS — J449 Chronic obstructive pulmonary disease, unspecified: Secondary | ICD-10-CM

## 2016-11-21 DIAGNOSIS — E559 Vitamin D deficiency, unspecified: Secondary | ICD-10-CM

## 2016-11-21 DIAGNOSIS — E119 Type 2 diabetes mellitus without complications: Secondary | ICD-10-CM

## 2016-11-21 DIAGNOSIS — R350 Frequency of micturition: Secondary | ICD-10-CM

## 2016-11-21 DIAGNOSIS — Z79899 Other long term (current) drug therapy: Secondary | ICD-10-CM

## 2016-11-21 DIAGNOSIS — I10 Essential (primary) hypertension: Secondary | ICD-10-CM

## 2016-11-21 DIAGNOSIS — I25119 Atherosclerotic heart disease of native coronary artery with unspecified angina pectoris: Secondary | ICD-10-CM

## 2016-11-21 DIAGNOSIS — E782 Mixed hyperlipidemia: Secondary | ICD-10-CM

## 2016-11-21 DIAGNOSIS — R972 Elevated prostate specific antigen [PSA]: Secondary | ICD-10-CM

## 2016-11-21 DIAGNOSIS — G4733 Obstructive sleep apnea (adult) (pediatric): Secondary | ICD-10-CM

## 2016-11-21 DIAGNOSIS — Z0001 Encounter for general adult medical examination with abnormal findings: Secondary | ICD-10-CM

## 2016-11-21 DIAGNOSIS — N401 Enlarged prostate with lower urinary tract symptoms: Secondary | ICD-10-CM

## 2016-11-21 DIAGNOSIS — F17200 Nicotine dependence, unspecified, uncomplicated: Secondary | ICD-10-CM

## 2016-11-21 DIAGNOSIS — G2581 Restless legs syndrome: Secondary | ICD-10-CM

## 2016-11-21 LAB — CBC WITH DIFFERENTIAL/PLATELET
BASOS ABS: 0 {cells}/uL (ref 0–200)
Basophils Relative: 0 %
Eosinophils Absolute: 360 cells/uL (ref 15–500)
Eosinophils Relative: 4 %
HCT: 48.1 % (ref 38.5–50.0)
Hemoglobin: 16.5 g/dL (ref 13.2–17.1)
LYMPHS PCT: 21 %
Lymphs Abs: 1890 cells/uL (ref 850–3900)
MCH: 31.9 pg (ref 27.0–33.0)
MCHC: 34.3 g/dL (ref 32.0–36.0)
MCV: 92.9 fL (ref 80.0–100.0)
MONOS PCT: 8 %
MPV: 11.2 fL (ref 7.5–12.5)
Monocytes Absolute: 720 cells/uL (ref 200–950)
NEUTROS ABS: 6030 {cells}/uL (ref 1500–7800)
Neutrophils Relative %: 67 %
PLATELETS: 160 10*3/uL (ref 140–400)
RBC: 5.18 MIL/uL (ref 4.20–5.80)
RDW: 13.1 % (ref 11.0–15.0)
WBC: 9 10*3/uL (ref 3.8–10.8)

## 2016-11-21 LAB — BASIC METABOLIC PANEL WITHOUT GFR
BUN: 14 mg/dL (ref 7–25)
CO2: 26 mmol/L (ref 20–31)
Calcium: 9.2 mg/dL (ref 8.6–10.3)
Chloride: 102 mmol/L (ref 98–110)
Creat: 0.79 mg/dL (ref 0.70–1.25)
GFR, Est African American: >89 mL/min
GFR, Est Non African American: >89 mL/min
Glucose, Bld: 148 mg/dL — ABNORMAL HIGH (ref 65–99)
Potassium: 4.3 mmol/L (ref 3.5–5.3)
Sodium: 135 mmol/L (ref 135–146)

## 2016-11-21 LAB — IRON AND TIBC
%SAT: 28 % (ref 15–60)
Iron: 97 ug/dL (ref 50–180)
TIBC: 344 ug/dL (ref 250–425)
UIBC: 247 ug/dL (ref 125–400)

## 2016-11-21 LAB — LIPID PANEL
CHOL/HDL RATIO: 3.1 ratio (ref <5.0)
CHOLESTEROL: 134 mg/dL (ref <200)
HDL: 43 mg/dL (ref >40)
LDL Cholesterol: 68 mg/dL (ref <100)
Triglycerides: 113 mg/dL (ref <150)
VLDL: 23 mg/dL (ref <30)

## 2016-11-21 LAB — VITAMIN B12: Vitamin B-12: 442 pg/mL (ref 200–1100)

## 2016-11-21 LAB — HEPATIC FUNCTION PANEL
ALBUMIN: 4.5 g/dL (ref 3.6–5.1)
ALT: 20 U/L (ref 9–46)
AST: 15 U/L (ref 10–35)
Alkaline Phosphatase: 89 U/L (ref 40–115)
BILIRUBIN DIRECT: 0.1 mg/dL (ref <=0.2)
Indirect Bilirubin: 0.5 mg/dL (ref 0.2–1.2)
Total Bilirubin: 0.6 mg/dL (ref 0.2–1.2)
Total Protein: 6.8 g/dL (ref 6.1–8.1)

## 2016-11-21 LAB — PSA: PSA: 2.9 ng/mL

## 2016-11-21 LAB — FERRITIN: Ferritin: 54 ng/mL (ref 20–380)

## 2016-11-21 LAB — TSH: TSH: 1.5 m[IU]/L (ref 0.40–4.50)

## 2016-11-21 MED ORDER — VARENICLINE TARTRATE 1 MG PO TABS: 1.0000 mg | ORAL_TABLET | Freq: Two times a day (BID) | ORAL | 2 refills | Status: DC

## 2016-11-21 MED ORDER — METFORMIN HCL 1000 MG PO TABS: 1000.0000 mg | ORAL_TABLET | Freq: Two times a day (BID) | ORAL | 1 refills | Status: DC

## 2016-11-21 NOTE — Assessment & Plan Note (Signed)
Goal A1C 6.5, continue medications, low carb diet discussed.

## 2016-11-21 NOTE — Progress Notes (Signed)
Complete Physical  Assessment and Plan:  Essential hypertension - continue medications, DASH diet, exercise and monitor at home. Call if greater than 130/80.  - CBC with Differential/Platelet - BASIC METABOLIC PANEL WITH GFR - Hepatic function panel - TSH   Coronary artery disease due to lipid rich plaque Control blood pressure, cholesterol, glucose, increase exercise.  Continue cardio follow up- getting stress test before knee surgery Smoking cessation advised   Mixed hyperlipidemia -continue medications, check lipids, decrease fatty foods, increase activity. Goal is less than 70 - Lipid panel  Type 2 diabetes mellitus without complication Discussed general issues about diabetes pathophysiology and management., Educational material distributed., Suggested low cholesterol diet., Encouraged aerobic exercise., Discussed foot care., Reminded to get yearly retinal exam. 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 Toy Cookey May want to get back on CPAP, if needs hosing/auto titration will let us know.   Smoking Smoking cessation-  instruction/counseling given, counseled patient on the dangers of tobacco use, advised patient to stop smoking, and reviewed strategies to maximize success, will start on chantix - discussed low dose CT screening, has 40 pack year smoking history, no symptoms at this time, after discussion will try to get low dose CT   Vitamin D deficiency - Vit D  25 hydroxy (rtn osteoporosis monitoring)   Medication management - Magnesium  Elevated PSA - PSA   RLS (restless legs syndrome) Declines meds at this time.  - Vitamin B12 - Iron and TIBC - Ferritin   Encounter for general adult medical examination with  abnormal findings - CT chest low dose - HM DIABETES FOOT EXAM  - DUE EYE EXAM - DUE COLONOSCOPY  Chronic obstructive pulmonary disease, unspecified COPD, unspecified chronic bronchitis type Advised to stop smoking, will get CT, no meds, no symptoms at this time   BPH (benign prostatic hyperplasia) Continue meds   Future Appointments Date Time Provider [place]  11/23/2016 7:15 AM [facility] NM2/TREAD [id number]  02/09/2017 9:00 AM Eileen Stanford, PA-C [facility]  11/21/2017 9:00 AM Vicie Mutters, PA-C [facility] None    Discussed med's effects and SE's. Screening labs and tests as requested with regular follow-up as recommended.  HPI 66 y.o. male  presents for a complete physical. His blood pressure has been controlled at home, today their BP is BP: 132/80 He is active but does not regularly exercise, denies chest pain, shortness of breath, dizziness.  He has a history of CAD s/p stent RCA in 2005, last cath 2012 and was unchanged, being treated medicaly and following with Dr. Johnsie Cancel, scheduled for right knee arthroscopy on the 18th, getting stress test this Thursday. Marland Kitchen  His cholesterol is diet controlled. In addition he is on crestor 20mg   3 days a week and denies myalgias. His cholesterol is at goal less than 70. The cholesterol last visit was:   Lab Results  Component Value Date   CHOL 121 06/30/2016   HDL 42 06/30/2016   LDLCALC 58 06/30/2016   TRIG 106 06/30/2016   CHOLHDL 2.9 06/30/2016   He has been working on diet and exercise for Diabetes with other circulatory complications, he is on ASA, he is on ACE/ARB,, he is on metformin 1000 BID and onglyza 5mg , and farxiga sugars have been in  130's, he is tolerating it well,, and denies paresthesia of the feet, polydipsia, polyuria and visual disturbances. Last A1C was:  Lab Results  Component Value Date   HGBA1C 7.1 (H) 06/30/2016   Lab Results  Component Value Date   GFRNONAA >89  06/30/2016   He has BPH and is on cardura which helps. He has a history of elevated PSA in the past.  Lab Results  Component Value Date   PSA 3.57 11/18/2015   PSA 3.58 05/06/2015   PSA 3.60 10/05/2014  Patient is on Vitamin D supplement, 7000 IU daily.   Still smoking but states that he has cut back on smoking, interested in quitting, would like to try chantix.  Has OSA and he is on the dental appliance.  BMI is Body mass index is 28.04 kg/m., he is working on diet and exercise. Wt Readings from Last 3 Encounters:  11/21/16 195 lb 6.4 oz (88.6 kg)  11/17/16 196 lb 12 oz (89.2 kg)  11/16/16 197 lb 12.8 oz (89.7 kg)   Current Medications:  Current Outpatient Prescriptions on File Prior to Visit  Medication Sig  . aspirin EC 81 MG tablet Take 1 tablet (81 mg total) by mouth daily.  . Cholecalciferol (VITAMIN D PO) Take 5,000 mg by mouth daily.  Marland Kitchen doxazosin (CARDURA) 8 MG tablet TAKE ONE TABLET BY MOUTH ONCE DAILY  . FARXIGA 10 MG TABS tablet TAKE ONE TABLET BY MOUTH ONCE DAILY  . fluticasone furoate-vilanterol (BREO ELLIPTA) 100-25 MCG/INH AEPB Inhale 1 puff into the lungs daily. Rinse mouth with water after each use  . metFORMIN (GLUCOPHAGE) 1000 MG tablet TAKE ONE TABLET BY MOUTH TWICE DAILY  . ramipril (ALTACE) 5 MG capsule TAKE ONE CAPSULE BY MOUTH ONCE DAILY  . rosuvastatin (CRESTOR) 20 MG tablet TAKE ONE TABLET BY MOUTH ONCE DAILY   No current facility-administered medications on file prior to visit.     Health Maintenance:  Immunization History  Administered Date(s) Administered  . Influenza-Unspecified 06/02/2013, 08/18/2014  . Pneumococcal Conjugate-13 11/21/2016  . Pneumococcal Polysaccharide-23 08/27/2007  . Tdap 08/27/2007  . Zoster 10/20/2013   Tetanus: 2009 Pneumovax: 2009 Prevnar13: DUE TODAY Flu vaccine: 05/2016 at work Zostavax: 2015  DEXA: N/A Colonoscopy  10/2011 due 2018 Dr. Fuller Plan DUE THIS YEAR CXR 11/2015 EGD: N/A CT head 2012 Carotid US 2012  2012 DEE- 3 years- [facility]- wears glasses- OVER DUE Dentist:  dentist  Medical History:  Past Medical History:  Diagnosis Date  . CAD (coronary artery disease)    a. s/p stent to RCA 2005; b. LHC 2006 after Nuc with inf ischemia: patent RCA stent, D1 80%, LAD 60% => med Rx;  c. LHC 04/18/11: Proximal LAD 60% (unchanged from previous catheterization), distal LAD 65% (small caliber), Dx 60%, proximal CFX 20%, mid CFX 50%, OM1 30%, proximal RCA 20%, mid RCA stent patent, EF 65%.=> med Rx  . GERD (gastroesophageal reflux disease)   . HLD (hyperlipidemia)   . Hypertension   . Obesity   . OSA (obstructive sleep apnea)    has a cpap-does not use-has a special mouthpiece he wears at night  . Type II or unspecified type diabetes mellitus without mention of complication, not stated as uncontrolled   . Vitamin D deficiency   . Wears glasses    Allergies No Known Allergies  SURGICAL HISTORY He  has a past surgical history that includes heart stent (2005); Colonoscopy (11/2011); Polypectomy; Cardiac catheterization (2005); Inguinal hernia repair (Right, 11/16/2014); and Insertion of  mesh (Right, 11/16/2014). FAMILY HISTORY His family history includes Hypertension in his father; Prostate cancer in his father; Stroke in his father. SOCIAL HISTORY He  reports that he has been smoking Cigarettes.  He has a 40.00 pack-year smoking history. He has never used smokeless tobacco. He reports that he drinks alcohol. He reports that he does not use drugs. ' Review of Systems:  Review of Systems  Constitutional: Negative.   HENT: Negative.   Respiratory: Negative.  Negative for cough, shortness of breath and wheezing.   Cardiovascular: Positive for chest pain. Negative for palpitations, orthopnea, claudication, leg swelling and PND.  Gastrointestinal: Positive for heartburn. Negative for abdominal pain, blood in stool, constipation, diarrhea, melena, nausea and vomiting.  Genitourinary:  Negative.   Musculoskeletal: Positive for back pain, joint pain and myalgias ( RLS). Negative for falls and neck pain.  Skin: Negative.   Neurological: Negative.  Negative for dizziness.  Psychiatric/Behavioral: Negative.    Physical Exam: Estimated body mass index is 28.04 kg/m as calculated from the following:   Height as of this encounter: 5\' 10"  (1.778 m).   Weight as of this encounter: 195 lb 6.4 oz (88.6 kg). Vitals:   11/21/16 0900  BP: 132/80  Pulse: 74  Resp: 16  Temp: 97.5 F (36.4 C)   General Appearance: Well nourished, in no apparent distress. Eyes: PERRLA, EOMs, conjunctiva no swelling or erythema, normal fundi and vessels. Sinuses: No Frontal/maxillary tenderness ENT/Mouth: Ext aud canals clear, normal light reflex with TMs without erythema, bulging. Good dentition. No erythema, swelling, or exudate on post pharynx. Tonsils not swollen or erythematous. Hearing normal.  Neck: Supple, thyroid normal. No bruits Respiratory: Respiratory effort normal, BS equal bilaterally without rales, rhonchi, wheezing or stridor. Cardio: RRR without murmurs, rubs or gallops. Brisk peripheral pulses without edema.  Chest: symmetric, with normal excursions and percussion. Abdomen: Soft, +BS. Non tender, no guarding, rebound, hernias, masses, or organomegaly. Lymphatics: Non tender without lymphadenopathy.  Genitourinary: defer Musculoskeletal: Full ROM all peripheral extremities,5/5 strength, and normal gait. Skin: Warm, dry without rashes, lesions, ecchymosis. Foot exam normal.  Neuro: Cranial nerves intact, reflexes equal bilaterally. Normal muscle tone, no cerebellar symptoms. Sensation intact.  Psych: Awake and oriented X 3, normal affect, Insight and Judgment appropriate.   EKG: defer cardiology   Vicie Mutters 9:19 AM

## 2016-11-21 NOTE — Assessment & Plan Note (Signed)
instruction/counseling given, counseled patient on the dangers of tobacco use, advised patient to stop smoking, and reviewed strategies to maximize success, chantix prescribed.

## 2016-11-21 NOTE — Assessment & Plan Note (Signed)
At goal, continue medications, dash diet discussed

## 2016-11-21 NOTE — Assessment & Plan Note (Signed)
Goal is less than 70, continue medications, diet discussed, at goal.

## 2016-11-21 NOTE — Telephone Encounter (Signed)
Patient given detailed instructions per Myocardial Perfusion Study Information Sheet for the test on 11/23/16 Patient notified to arrive 15 minutes early and that it is imperative to arrive on time for appointment to keep from having the test rescheduled.  If you need to cancel or reschedule your appointment, please call the office within 24 hours of your appointment. Failure to do so may result in a cancellation of your appointment, and a $50 no show fee. Patient verbalized understanding. Joe Green [name]    

## 2016-11-21 NOTE — Assessment & Plan Note (Signed)
No symptoms at this time, advised to quit smoking, chantix sent in, will try to get low dose CT scan for screening, discussed with patient.

## 2016-11-21 NOTE — Patient Instructions (Signed)
If you have a smart phone, please look up Smoke Free app, this will help you stay on track and give you information about money you have saved, life that you have gained back and a ton of more information.   We are giving you chantix for smoking cessation. You can do it! And we are here to help! You may have heard some scary side effects about chantix, the three most common I hear about are nausea, crazy dreams and depression.  However, I like for my patients to try to stay on 1/2 a tablet twice a day rather than one tablet twice a day as normally prescribed. This helps decrease the chances of side effects and helps save money by making a one month prescription last two months  Please start the prescription this way:  Start 1/2 tablet by mouth once daily after food with a full glass of water for 3 days Then do 1/2 tablet by mouth twice daily for 4 days. During this first week you can smoke, but try to stop after this week.  At this point we have several options: 1) continue on 1/2 tablet twice a day- which I encourage you to do. You can stay on this dose the rest of the time on the medication or if you still feel the need to smoke you can do one of the two options below. 2) do one tablet in the morning and 1/2 in the evening which helps decrease dreams. 3) do one tablet twice a day.   What if I miss a dose? If you miss a dose, take it as soon as you can. If it is almost time for your next dose, take only that dose. Do not take double or extra doses.  What should I watch for while using this medicine? Visit your doctor or health care professional for regular check ups. Ask for ongoing advice and encouragement from your doctor or healthcare professional, friends, and family to help you quit. If you smoke while on this medication, quit again  Your mouth may get dry. Chewing sugarless gum or hard candy, and drinking plenty of water may help. Contact your doctor if the problem does not go away or is  severe.  You may get drowsy or dizzy. Do not drive, use machinery, or do anything that needs mental alertness until you know how this medicine affects you. Do not stand or sit up quickly, especially if you are an older patient.   The use of this medicine may increase the chance of suicidal thoughts or actions. Pay special attention to how you are responding while on this medicine. Any worsening of mood, or thoughts of suicide or dying should be reported to your health care professional right away.  ADVANTAGES OF QUITTING SMOKING  Within 20 minutes, blood pressure decreases. Your pulse is at normal level.  After 8 hours, carbon monoxide levels in the blood return to normal. Your oxygen level increases.  After 24 hours, the chance of having a heart attack starts to decrease. Your breath, hair, and body stop smelling like smoke.  After 48 hours, damaged nerve endings begin to recover. Your sense of taste and smell improve.  After 72 hours, the body is virtually free of nicotine. Your bronchial tubes relax and breathing becomes easier.  After 2 to 12 weeks, lungs can hold more air. Exercise becomes easier and circulation improves.  After 1 year, the risk of coronary heart disease is cut in half.  After 5 years,   the risk of stroke falls to the same as a nonsmoker.  After 10 years, the risk of lung cancer is cut in half and the risk of other cancers decreases significantly.  After 15 years, the risk of coronary heart disease drops, usually to the level of a nonsmoker.  You will have extra money to spend on things other than cigarettes.  Diabetes is a very complicated disease...lets simplify it.  An easy way to look at it to understand the complications is if you think of the extra sugar floating in your blood stream as glass shards floating through your blood stream.    Diabetes affects your small vessels first: 1) The glass shards (sugar) scraps down the tiny blood vessels in your eyes  and lead to diabetic retinopathy, the leading cause of blindness in the [place]. Diabetes is the leading cause of newly diagnosed adult (50 to 65 years of age) blindness in the [place].  2) The glass shards scratches down the tiny vessels of your legs leading to nerve damage called neuropathy and can lead to amputations of your feet. More than 60% of all non-traumatic amputations of lower limbs occur in people with diabetes.  3) Over time the small vessels in your brain are shredded and closed off, individually this does not cause any problems but over a long period of time many of the small vessels being blocked can lead to Vascular Dementia.   4) Your kidney's are a filter system and have a "net" that keeps certain things in the body and lets bad things out. Sugar shreds this net and leads to kidney damage and eventually failure. Decreasing the sugar that is destroying the net and certain blood pressure medications can help stop or decrease progression of kidney disease. Diabetes was the primary cause of kidney failure in 44 percent of all new cases in 2011.  5) Diabetes also destroys the small vessels in your penis that lead to erectile dysfunction. Eventually the vessels are so damaged that you may not be responsive to cialis or viagra.   Diabetes and your large vessels: Your larger vessels consist of your coronary arteries in your heart and the carotid vessels to your brain. Diabetes or even increased sugars put you at 300% increased risk of heart attack and stroke and this is why.. The sugar scrapes down your large blood vessels and your body sees this as an internal injury and tries to repair itself. Just like you get a scab on your skin, your platelets will stick to the blood vessel wall trying to heal it. This is why we have diabetics on low dose aspirin daily, this prevents the platelets from sticking and can prevent plaque formation. In addition, your body takes cholesterol and tries to shove  it into the open wound. This is why we want your LDL, or bad cholesterol, below 70.   The combination of platelets and cholesterol over 5-10 years forms plaque that can break off and cause a heart attack or stroke.   PLEASE REMEMBER:  Diabetes is preventable! Up to 52 percent of complications and morbidities among individuals with type 2 diabetes can be prevented, delayed, or effectively treated and minimized with regular visits to a health professional, appropriate monitoring and medication, and a healthy diet and lifestyle.     Bad carbs also include fruit juice, alcohol, and sweet tea. These are empty calories that do not signal to your brain that you are full.   Please remember the good carbs  are still carbs which convert into sugar. So please measure them out no more than 1/2-1 cup of rice, oatmeal, pasta, and beans  Veggies are however free foods! Pile them on.   Not all fruit is created equal. Please see the list below, the fruit at the bottom is higher in sugars than the fruit at the top. Please avoid all dried fruits.      Simple math prevails.    1st - exercise does not produce significant weight loss - at best one converts fat into muscle , "bulks up", loses inches, but usually stays "weight neutral"     2nd - think of your body weightas a check book: If you eat more calories than you burn up - you save money or gain weight .... Or if you spend more money than you put in the check book, ie burn up more calories than you eat, then you lose weight     3rd - if you walk or run 1 mile, you burn up 100 calories - you have to burn up 3,500 calories to lose 1 pound, ie you have to walk/run 35 miles to lose 1 measly pound. So if you want to lose 10 #, then you have to walk/run 350 miles, so.... clearly exercise is not the solution.     4. So if you consume 1,500 calories, then you have to burn up the equivalent of 15 miles to stay weight neutral - It also stands to reason that if you  consume 1,500 cal/day and don't lose weight, then you must be burning up about 1,500 cals/day to stay weight neutral.     5. If you really want to lose weight, you must cut your calorie intake 300 calories /day and at that rate you should lose about 1 # every 3 days.   6. Please purchase Dr Fara Olden Fuhrman's book(s) "The End of Dieting" & "Eat to Live" . It has some great concepts and recipes.

## 2016-11-22 LAB — URINALYSIS, ROUTINE W REFLEX MICROSCOPIC
Bilirubin Urine: NEGATIVE
Hgb urine dipstick: NEGATIVE
KETONES UR: NEGATIVE
Leukocytes, UA: NEGATIVE
Nitrite: NEGATIVE
PROTEIN: NEGATIVE
SPECIFIC GRAVITY, URINE: 1.034 (ref 1.001–1.035)
pH: 6 (ref 5.0–8.0)

## 2016-11-22 LAB — URINALYSIS, MICROSCOPIC ONLY
Bacteria, UA: NONE SEEN [HPF]
Casts: NONE SEEN [LPF]
Crystals: NONE SEEN [HPF]
RBC / HPF: NONE SEEN RBC/HPF (ref <=2)
SQUAMOUS EPITHELIAL / LPF: NONE SEEN [HPF] (ref <=5)
WBC UA: NONE SEEN WBC/HPF (ref <=5)
Yeast: NONE SEEN [HPF]

## 2016-11-22 LAB — MICROALBUMIN / CREATININE URINE RATIO
CREATININE, URINE: 66 mg/dL (ref 20–370)
MICROALB UR: 1.3 mg/dL
Microalb Creat Ratio: 20 mcg/mg creat (ref <30)

## 2016-11-22 LAB — MAGNESIUM: Magnesium: 2.1 mg/dL (ref 1.5–2.5)

## 2016-11-22 LAB — HEMOGLOBIN A1C
Hgb A1c MFr Bld: 7.1 % — ABNORMAL HIGH (ref <5.7)
Mean Plasma Glucose: 157 mg/dL

## 2016-11-22 LAB — VITAMIN D 25 HYDROXY (VIT D DEFICIENCY, FRACTURES): VIT D 25 HYDROXY: 68 ng/mL (ref 30–100)

## 2016-11-23 ENCOUNTER — Encounter: Payer: Self-pay | Admitting: Physician Assistant

## 2016-11-23 ENCOUNTER — Ambulatory Visit (HOSPITAL_COMMUNITY): Payer: 59 | Attending: Physician Assistant

## 2016-11-23 DIAGNOSIS — I10 Essential (primary) hypertension: Secondary | ICD-10-CM | POA: Diagnosis not present

## 2016-11-23 DIAGNOSIS — R079 Chest pain, unspecified: Secondary | ICD-10-CM | POA: Diagnosis present

## 2016-11-23 DIAGNOSIS — E119 Type 2 diabetes mellitus without complications: Secondary | ICD-10-CM | POA: Diagnosis not present

## 2016-11-23 DIAGNOSIS — I2510 Atherosclerotic heart disease of native coronary artery without angina pectoris: Secondary | ICD-10-CM | POA: Insufficient documentation

## 2016-11-23 DIAGNOSIS — I25119 Atherosclerotic heart disease of native coronary artery with unspecified angina pectoris: Secondary | ICD-10-CM

## 2016-11-23 LAB — MYOCARDIAL PERFUSION IMAGING
CHL CUP NUCLEAR SDS: 2
CHL CUP RESTING HR STRESS: 67 {beats}/min
CSEPPHR: 98 {beats}/min
LV dias vol: 116 mL (ref 62–150)
LV sys vol: 49 mL
RATE: 0.35
SRS: 2
SSS: 4
TID: 1.07

## 2016-11-23 MED ORDER — REGADENOSON 0.4 MG/5ML IV SOLN
0.4000 mg | Freq: Once | INTRAVENOUS | Status: AC
  Administered 2016-11-23: 0.4 mg via INTRAVENOUS

## 2016-11-23 MED ORDER — TECHNETIUM TC 99M TETROFOSMIN IV KIT
10.2000 | PACK | Freq: Once | INTRAVENOUS | Status: AC | PRN
  Administered 2016-11-23: 10.2 via INTRAVENOUS
  Filled 2016-11-23: qty 11

## 2016-11-23 MED ORDER — TECHNETIUM TC 99M TETROFOSMIN IV KIT
30.0000 | PACK | Freq: Once | INTRAVENOUS | Status: AC | PRN
Start: 2016-11-23 — End: 2016-11-23
  Administered 2016-11-23: 30 via INTRAVENOUS
  Filled 2016-11-23: qty 30

## 2016-11-23 NOTE — Progress Notes (Addendum)
I am covering Joe Green's box this week. Nuc result in Epic, have forwarded to Dr. Johnsie Cancel to weigh in on if patient is cleared for surgery since he knows the patient. No mention of ischemia, but old scar. Hanley Woerner PA-C

## 2016-11-23 NOTE — Progress Notes (Signed)
Pt aware of lab results & voiced understanding of those results.

## 2016-11-24 ENCOUNTER — Telehealth: Payer: Self-pay | Admitting: Physician Assistant

## 2016-11-24 NOTE — Telephone Encounter (Signed)
New Message    Pt is returning [place] call for Stress test results

## 2016-11-24 NOTE — Telephone Encounter (Signed)
-----   Message from Charlie Pitter, [place] sent at 11/24/2016  1:05 PM EDT ----- I am covering Katie's box, had Dr. Johnsie Cancel review nuc. 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 Keep f/u as planned. Dayna Dunn PA-C

## 2016-11-24 NOTE — Telephone Encounter (Signed)
Returned pts call and we discussed his stress test results and he verbalized understanding.

## 2016-12-15 ENCOUNTER — Ambulatory Visit
Admission: RE | Admit: 2016-12-15 | Discharge: 2016-12-15 | Disposition: A | Discharge status: Home / Self Care | Payer: 59 | Source: Ambulatory Visit | Attending: Physician Assistant | Admitting: Physician Assistant

## 2016-12-15 DIAGNOSIS — F17200 Nicotine dependence, unspecified, uncomplicated: Secondary | ICD-10-CM

## 2016-12-18 NOTE — Progress Notes (Signed)
Pt aware of lab results & voiced understanding of those results.

## 2016-12-27 ENCOUNTER — Other Ambulatory Visit: Payer: Self-pay | Admitting: Internal Medicine

## 2016-12-27 DIAGNOSIS — N320 Bladder-neck obstruction: Secondary | ICD-10-CM

## 2017-02-09 ENCOUNTER — Ambulatory Visit: Payer: 59 | Admitting: Physician Assistant

## 2017-02-15 NOTE — Progress Notes (Signed)
Cardiology Office Note    Date:  02/16/2017   ID:  Joe Green, DOB 06/12/1951, MRN [id number]  PCP:  [name], MD  Cardiologist: Dr. Johnsie Cancel   CC: follow up  History of Present Illness:  Joe Green is a 66 y.o. male with a history of CAD s/p stent to [place] 2005, DM2, HTN, HLD, OSA non compliant with CPAP but wears mouth piece, ongoing tobacco abuse and RBBB who presents to clinic for follow up.   He has a history of CAD s/p PCI to RCA in 2005. He underwent LHC in 2006 after nuc showed inf ischemia which showed patent RCA stent, D1 80%, and LAD 60%. Medical therapy was recommended.  He underwent LHC in 04/18/11 which showed a proximal LAD 60% (unchanged from previous catheterization), distal LAD 65% (small caliber), Dx 60%, proximal CFX 20%, mid CFX 50%, OM1 30%, proximal RCA 20%, mid RCA stent patent, EF 65%. Medical therapy recommended.   He has done pretty well since last heart cath in 2012. He last saw Dr. Johnsie Cancel in 05/2014.   I saw him in clinic on 11/17/16 for evaluation of chest pain which I felt were atypical (twinges that lasted minutes and self resolved, not related to exertion). However, since he had upcoming knee surgery, I ordered a myoview. This was completed 11/23/16 and was low risk for ischemia.   Today he presents to clinic for follow up. He still hasn't gotten knee surgery because he switched insurances. No more chest pains. No shortness of breath. Still smoking less than half a pack per day. He tried chantix but didn't like it. But he is making progress and cutting back. No LE edema, orthopnea or PND. No dizziness or syncope. No blood in stool or urine. No palpitations.    Past Medical History:  Diagnosis Date  . CAD (coronary artery disease)    a. s/p stent to RCA 2005; b. LHC 2006 after Nuc with inf ischemia: patent RCA stent, D1 80%, LAD 60% => med Rx;  c. LHC 04/18/11: Proximal LAD 60% (unchanged from previous catheterization), distal LAD 65% (small  caliber), Dx 60%, proximal CFX 20%, mid CFX 50%, OM1 30%, proximal RCA 20%, mid RCA stent patent, EF 65%.=> med Rx  . GERD (gastroesophageal reflux disease)   . HLD (hyperlipidemia)   . Hypertension   . Obesity   . OSA (obstructive sleep apnea)    has a cpap-does not use-has a special mouthpiece he wears at night  . Type II or unspecified type diabetes mellitus without mention of complication, not stated as uncontrolled   . Vitamin D deficiency   . Wears glasses     Past Surgical History:  Procedure Laterality Date  . CARDIAC CATHETERIZATION  2005   stent  . COLONOSCOPY  11/2011   Due 5 years Dr. Fuller Plan  . heart stent  2005   RCA  . INGUINAL HERNIA REPAIR Right 11/16/2014   Procedure: RIGHT INGUINAL HERNIA REPAIR WITH MESH;  Surgeon: Armandina Gemma, MD;  Location: [place];  Service: General;  Laterality: Right;  . INSERTION OF MESH Right 11/16/2014   Procedure: INSERTION OF MESH;  Surgeon: Armandina Gemma, MD;  Location: [facility];  Service: General;  Laterality: Right;  . POLYPECTOMY      Current Medications: Outpatient Medications Prior to Visit  Medication Sig Dispense Refill  . aspirin EC 81 MG tablet Take 1 tablet (81 mg total) by mouth daily. 90 tablet 3  .  Cholecalciferol (VITAMIN D PO) Take 5,000 mg by mouth daily.    Marland Kitchen doxazosin (CARDURA) 8 MG tablet TAKE ONE TABLET BY MOUTH ONCE DAILY 90 tablet 1  . FARXIGA 10 MG TABS tablet TAKE ONE TABLET BY MOUTH ONCE DAILY 90 tablet 1  . fluticasone furoate-vilanterol (BREO ELLIPTA) 100-25 MCG/INH AEPB Inhale 1 puff into the lungs daily. Rinse mouth with water after each use 1 each 3  . metFORMIN (GLUCOPHAGE) 1000 MG tablet Take 1 tablet (1,000 mg total) by mouth 2 (two) times daily. 180 tablet 1  . ramipril (ALTACE) 5 MG capsule TAKE ONE CAPSULE BY MOUTH ONCE DAILY 90 capsule 1  . rosuvastatin (CRESTOR) 20 MG tablet TAKE ONE TABLET BY MOUTH ONCE DAILY 90 tablet 1  . varenicline (CHANTIX CONTINUING MONTH PAK)  1 MG tablet Take 1 tablet (1 mg total) by mouth 2 (two) times daily. 56 tablet 2   No facility-administered medications prior to visit.      Allergies:   Patient has no known allergies.   Social History   Social History  . Marital status: Married    Spouse name: N/A  . Number of children: N/A  . Years of education: N/A   Social History Main Topics  . Smoking status: Current Every Day Smoker    Packs/day: 1.00    Years: 40.00    Types: Cigarettes  . Smokeless tobacco: Never Used     Comment: sent in chantix  . Alcohol use Yes     Comment: 2-3 beers most nights  . Drug use: No  . Sexual activity: Not Currently   Other Topics Concern  . None   Social History Narrative  . None     Family History:  The patient's family history includes Hypertension in his father; Prostate cancer in his father; Stroke in his father.      ROS:   Please see the history of present illness.    ROS All other systems reviewed and are negative.   PHYSICAL EXAM:   VS:  BP 122/82   Pulse 79   Ht 5\' 10"  (1.778 m)   Wt 192 lb (87.1 kg)   SpO2 94%   BMI 27.55 kg/m    GEN: Well nourished, well developed, in no acute distress  HEENT: normal  Neck: no JVD, carotid bruits, or masses Cardiac: RRR; no murmurs, rubs, or gallops,no edema  Respiratory:  clear to auscultation bilaterally, normal work of breathing GI: soft, nontender, nondistended, + BS MS: no deformity or atrophy  Skin: warm and dry, no rash Neuro:  Alert and Oriented x 3, Strength and sensation are intact Psych: euthymic mood, full affect   Wt Readings from Last 3 Encounters:  02/16/17 192 lb (87.1 kg)  11/23/16 196 lb (88.9 kg)  11/21/16 195 lb 6.4 oz (88.6 kg)      Studies/Labs Reviewed:   EKG:  EKG is ordered today  Recent Labs: 11/21/2016: ALT 20; BUN 14; Creat 0.79; Hemoglobin 16.5; Magnesium 2.1; Platelets 160; Potassium 4.3; Sodium 135; TSH 1.50   Lipid Panel    Component Value Date/Time   CHOL 134 11/21/2016  0937   TRIG 113 11/21/2016 0937   HDL 43 11/21/2016 0937   CHOLHDL 3.1 11/21/2016 0937   VLDL 23 11/21/2016 0937   LDLCALC 68 11/21/2016 0937    Additional studies/ records that were reviewed today include:  Carotid artery dopplers 03/13/2011 IMPRESSION: 1. No significant plaque accumulation or stenosis.  Cath 2012 IMPRESSION: 1. Stable triple-vessel coronary artery  disease. 2. Normal left ventricular systolic function.  RECOMMENDATIONS: At this time, I recommend continued medical management. I do not think that any of the patient's lesions have progressed in a manner that would indicate a need for percutaneous intervention. The patient did not have any anterior wall ischemia on his Myoview.  11/23/16 Study Highlights    Nuclear stress EF: 58%. No wall motion abnormalities  There was no ST segment deviation noted during stress.  Defect 1: There is a small defect of moderate severity present in the apex location.  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  This is a low risk study.   Candee Furbish, MD     ASSESSMENT & PLAN:   CAD: recent nuc 11/2016 was low risk. No more chest pain. Continue ASA, statin and BB   HTN: BP well controlled today   HLD: LDL excellent at 58 and LFTs normal in 06/2016. Continue statin   DMT2: continue current regimen. Followed by PCP  OSA: complaint with mouth piece but cannot tolerate mask   Tobacco abuse: almost quit. Tried Chantix but did not tolerate it well. He promises that he will be quit by his next visit.    Medication Adjustments/Labs and Tests Ordered: Current medicines are reviewed at length with the patient today.  Concerns regarding medicines are outlined above.  Medication changes, Labs and Tests ordered today are listed in the Patient Instructions below. Patient Instructions  Medication Instructions:  Your physician recommends that you continue on your current medications as directed. Please  refer to the Current Medication list given to you today.   Labwork: None ordered  Testing/Procedures: None ordered  Follow-Up: Your physician wants you to follow-up in: [place] DR. Johnsie Cancel   You will receive a reminder letter in the mail two months in advance. If you don't receive a letter, please call our office to schedule the follow-up appointment.   Any Other Special Instructions Will Be Listed Below (If Applicable).     If you need a refill on your cardiac medications before your next appointment, please call your pharmacy.      Signed, Angelena Form, PA-C  02/16/2017 9:37 AM    [facility] [place], [place], [place]  [address] Phone: [phone]; Fax: [phone]

## 2017-02-16 ENCOUNTER — Ambulatory Visit (INDEPENDENT_AMBULATORY_CARE_PROVIDER_SITE_OTHER): Payer: BLUE CROSS/BLUE SHIELD | Admitting: Physician Assistant

## 2017-02-16 ENCOUNTER — Encounter: Payer: Self-pay | Admitting: Physician Assistant

## 2017-02-16 ENCOUNTER — Encounter (INDEPENDENT_AMBULATORY_CARE_PROVIDER_SITE_OTHER): Payer: Self-pay

## 2017-02-16 VITALS — BP 122/82 | HR 79 | Ht 70.0 in | Wt 192.0 lb

## 2017-02-16 DIAGNOSIS — I25119 Atherosclerotic heart disease of native coronary artery with unspecified angina pectoris: Secondary | ICD-10-CM | POA: Diagnosis not present

## 2017-02-16 DIAGNOSIS — E118 Type 2 diabetes mellitus with unspecified complications: Secondary | ICD-10-CM

## 2017-02-16 DIAGNOSIS — E785 Hyperlipidemia, unspecified: Secondary | ICD-10-CM | POA: Diagnosis not present

## 2017-02-16 DIAGNOSIS — G4733 Obstructive sleep apnea (adult) (pediatric): Secondary | ICD-10-CM | POA: Diagnosis not present

## 2017-02-16 DIAGNOSIS — I10 Essential (primary) hypertension: Secondary | ICD-10-CM

## 2017-02-16 DIAGNOSIS — Z720 Tobacco use: Secondary | ICD-10-CM

## 2017-02-16 NOTE — Patient Instructions (Signed)
Medication Instructions:  Your physician recommends that you continue on your current medications as directed. Please refer to the Current Medication list given to you today.   Labwork: None ordered  Testing/Procedures: None ordered  Follow-Up: Your physician wants you to follow-up in: 6 MONTHS WITH DR. NISHAN  You will receive a reminder letter in the mail two months in advance. If you don't receive a letter, please call our office to schedule the follow-up appointment.    Any Other Special Instructions Will Be Listed Below (If Applicable).     If you need a refill on your cardiac medications before your next appointment, please call your pharmacy.   

## 2017-03-02 ENCOUNTER — Encounter: Payer: Self-pay | Admitting: Physician Assistant

## 2017-03-02 ENCOUNTER — Other Ambulatory Visit: Payer: Self-pay

## 2017-03-02 ENCOUNTER — Ambulatory Visit (INDEPENDENT_AMBULATORY_CARE_PROVIDER_SITE_OTHER): Payer: BLUE CROSS/BLUE SHIELD | Admitting: Physician Assistant

## 2017-03-02 VITALS — BP 130/70 | HR 62 | Temp 97.7°F | Resp 16 | Ht 70.0 in | Wt 192.2 lb

## 2017-03-02 DIAGNOSIS — G4733 Obstructive sleep apnea (adult) (pediatric): Secondary | ICD-10-CM | POA: Diagnosis not present

## 2017-03-02 DIAGNOSIS — J449 Chronic obstructive pulmonary disease, unspecified: Secondary | ICD-10-CM | POA: Diagnosis not present

## 2017-03-02 DIAGNOSIS — Z79899 Other long term (current) drug therapy: Secondary | ICD-10-CM | POA: Diagnosis not present

## 2017-03-02 DIAGNOSIS — E119 Type 2 diabetes mellitus without complications: Secondary | ICD-10-CM | POA: Diagnosis not present

## 2017-03-02 DIAGNOSIS — I10 Essential (primary) hypertension: Secondary | ICD-10-CM | POA: Diagnosis not present

## 2017-03-02 DIAGNOSIS — E782 Mixed hyperlipidemia: Secondary | ICD-10-CM

## 2017-03-02 DIAGNOSIS — F17200 Nicotine dependence, unspecified, uncomplicated: Secondary | ICD-10-CM

## 2017-03-02 DIAGNOSIS — I25119 Atherosclerotic heart disease of native coronary artery with unspecified angina pectoris: Secondary | ICD-10-CM

## 2017-03-02 LAB — LIPID PANEL
CHOL/HDL RATIO: 4.2 ratio (ref <5.0)
Cholesterol: 173 mg/dL (ref <200)
HDL: 41 mg/dL (ref >40)
LDL Cholesterol: 115 mg/dL — ABNORMAL HIGH (ref <100)
Triglycerides: 86 mg/dL (ref <150)
VLDL: 17 mg/dL (ref <30)

## 2017-03-02 LAB — HEPATIC FUNCTION PANEL
ALBUMIN: 4.1 g/dL (ref 3.6–5.1)
ALT: 16 U/L (ref 9–46)
AST: 13 U/L (ref 10–35)
Alkaline Phosphatase: 84 U/L (ref 40–115)
BILIRUBIN DIRECT: 0.1 mg/dL (ref <=0.2)
Indirect Bilirubin: 0.5 mg/dL (ref 0.2–1.2)
Total Bilirubin: 0.6 mg/dL (ref 0.2–1.2)
Total Protein: 6.4 g/dL (ref 6.1–8.1)

## 2017-03-02 LAB — CBC WITH DIFFERENTIAL/PLATELET
BASOS PCT: 0 %
Basophils Absolute: 0 cells/uL (ref 0–200)
EOS PCT: 5 %
Eosinophils Absolute: 415 cells/uL (ref 15–500)
HCT: 49.8 % (ref 38.5–50.0)
Hemoglobin: 16.9 g/dL (ref 13.2–17.1)
LYMPHS PCT: 27 %
Lymphs Abs: 2241 cells/uL (ref 850–3900)
MCH: 31.7 pg (ref 27.0–33.0)
MCHC: 33.9 g/dL (ref 32.0–36.0)
MCV: 93.4 fL (ref 80.0–100.0)
MONO ABS: 581 {cells}/uL (ref 200–950)
MONOS PCT: 7 %
MPV: 11.2 fL (ref 7.5–12.5)
NEUTROS PCT: 61 %
Neutro Abs: 5063 cells/uL (ref 1500–7800)
Platelets: 177 10*3/uL (ref 140–400)
RBC: 5.33 MIL/uL (ref 4.20–5.80)
RDW: 13 % (ref 11.0–15.0)
WBC: 8.3 10*3/uL (ref 3.8–10.8)

## 2017-03-02 LAB — BASIC METABOLIC PANEL WITH GFR
BUN: 9 mg/dL (ref 7–25)
CALCIUM: 9 mg/dL (ref 8.6–10.3)
CO2: 23 mmol/L (ref 20–31)
Chloride: 104 mmol/L (ref 98–110)
Creat: 0.65 mg/dL — ABNORMAL LOW (ref 0.70–1.25)
GFR, Est African American: >89 mL/min (ref >=60)
Glucose, Bld: 130 mg/dL — ABNORMAL HIGH (ref 65–99)
POTASSIUM: 4.2 mmol/L (ref 3.5–5.3)
SODIUM: 137 mmol/L (ref 135–146)

## 2017-03-02 MED ORDER — DAPAGLIFLOZIN PROPANEDIOL 10 MG PO TABS
10.0000 mg | ORAL_TABLET | Freq: Every day | ORAL | 1 refills | Status: DC
Start: 2017-03-02 — End: 2017-09-15

## 2017-03-02 MED ORDER — GLUCOSE BLOOD VI STRP: ORAL_STRIP | 2 refills | Status: DC

## 2017-03-02 NOTE — Patient Instructions (Signed)
When run out of farxiga will switch to jardiance  Here is some information to help you keep your heart healthy: Move it! - Aim for 30 mins of activity every day. Take it slowly at first. Talk to [place] before starting any new exercise program.   Lose it.  -Body Mass Index (BMI) can indicate if you need to lose weight. A healthy range is 18.5-24.9. For a BMI calculator, go to [employer]  Waist Management -Excess abdominal fat is a risk factor for heart disease, diabetes, asthma, stroke and more. Ideal waist circumference is less than 35" for women and less than 40" for men.   Eat Right -focus on fruits, vegetables, whole grains, and meals you make yourself. Avoid foods with trans fat and high sugar/sodium content.   Snooze or Snore? - Loud snoring can be a sign of sleep apnea, a significant risk factor for high blood pressure, heart attach, stroke, and heart arrhythmias.  Kick the habit -Quit Smoking! Avoid second hand smoke. A single cigarette raises your blood pressure for 20 mins and increases the risk of heart attack and stroke for the next 24 hours.   Are Aspirin and Supplements right for you? -Add ENTERIC COATED low dose 81 mg Aspirin daily OR can do every other day if you have easy bruising to protect your heart and head. As well as to reduce risk of Colon Cancer by 20 %, Skin Cancer by 26 % , Melanoma by 46% and Pancreatic cancer by 60%  Say "No to Stress -There may be little you can do about problems that cause stress. However, techniques such as long walks, meditation, and exercise can help you manage it.   Start Now! - Make changes one at a time and set reasonable goals to increase your likelihood of success.

## 2017-03-02 NOTE — Progress Notes (Signed)
Patient ID: Joe Green, male   DOB: May 17, 1951, 66 y.o.   MRN: [id number]  Assessment and Plan:  Hypertension:  -Continue medication -monitor blood pressure at home. -Continue DASH diet -Reminder to go to the ER if any CP, SOB, nausea, dizziness, severe HA, changes vision/speech, left arm numbness and tingling and jaw pain.  Cholesterol - Continue diet and exercise -Check cholesterol.   Diabetes without complications -Continue diet and exercise.  -Check A1C  Vitamin D Def -check level -continue medications.   Smoking -  instruction/counseling given, counseled patient on the dangers of tobacco use, advised patient to stop smoking, and reviewed strategies to maximize success, will discuss chantix next OV  COPD Advised to stop smoking  Continue diet and meds as discussed. Further disposition pending results of labs. Discussed med's effects and SE's.   Future Appointments Date Time Provider [place]  11/21/2017 9:00 AM Vicie Mutters, PA-C [facility] None    HPI 66 y.o. male  presents for 3 month follow up with hypertension, hyperlipidemia, diabetes and vitamin D deficiency.   His blood pressure has been controlled at home, today their BP is BP: 130/70.He does workout. He denies chest pain, shortness of breath, dizziness. He is a smoker, he would like to quit. Had normal stress test 2018, carotid US 2012.    He is on cholesterol medication and denies myalgias. His cholesterol is at goal. The cholesterol was:  11/21/2016: Cholesterol 134; HDL 43; LDL Cholesterol 68; Triglycerides 113   He has been working on diet and exercise for diabetes without complications, he is on bASA, he is on ACE/ARB, and denies  foot ulcerations, hyperglycemia, hypoglycemia , increased appetite, nausea, paresthesia of the feet, polydipsia, polyuria, visual disturbances, vomiting and weight loss. He is on MF and farxiga, he checks his sugars 2-3 x a week, sugars running 130's in AM. Last A1C  was: 11/21/2016: Hgb A1c MFr Bld 7.1   Patient is on Vitamin D supplement. 11/21/2016: Vit D, 25-Hydroxy 68   BMI is Body mass index is 27.58 kg/m., he is working on diet and exercise. Wt Readings from Last 3 Encounters:  03/02/17 192 lb 3.2 oz (87.2 kg)  02/16/17 192 lb (87.1 kg)  11/23/16 196 lb (88.9 kg)    Current Medications:  Current Outpatient Prescriptions on File Prior to Visit  Medication Sig Dispense Refill  . aspirin EC 81 MG tablet Take 1 tablet (81 mg total) by mouth daily. 90 tablet 3  . Cholecalciferol (VITAMIN D PO) Take 5,000 mg by mouth daily.    Marland Kitchen doxazosin (CARDURA) 8 MG tablet TAKE ONE TABLET BY MOUTH ONCE DAILY 90 tablet 1  . fluticasone furoate-vilanterol (BREO ELLIPTA) 100-25 MCG/INH AEPB Inhale 1 puff into the lungs daily. Rinse mouth with water after each use 1 each 3  . metFORMIN (GLUCOPHAGE) 1000 MG tablet Take 1 tablet (1,000 mg total) by mouth 2 (two) times daily. 180 tablet 1  . ramipril (ALTACE) 5 MG capsule TAKE ONE CAPSULE BY MOUTH ONCE DAILY 90 capsule 1  . rosuvastatin (CRESTOR) 20 MG tablet TAKE ONE TABLET BY MOUTH ONCE DAILY 90 tablet 1  . varenicline (CHANTIX CONTINUING MONTH PAK) 1 MG tablet Take 1 tablet (1 mg total) by mouth 2 (two) times daily. 56 tablet 2   No current facility-administered medications on file prior to visit.    Medical History:  Past Medical History:  Diagnosis Date  . CAD (coronary artery disease)    a. s/p stent to RCA 2005; b. LHC  2006 after Nuc with inf ischemia: patent RCA stent, D1 80%, LAD 60% => med Rx;  c. LHC 04/18/11: Proximal LAD 60% (unchanged from previous catheterization), distal LAD 65% (small caliber), Dx 60%, proximal CFX 20%, mid CFX 50%, OM1 30%, proximal RCA 20%, mid RCA stent patent, EF 65%.=> med Rx  . GERD (gastroesophageal reflux disease)   . HLD (hyperlipidemia)   . Hypertension   . Obesity   . OSA (obstructive sleep apnea)    has a cpap-does not use-has a special mouthpiece he wears at night  .  Type II or unspecified type diabetes mellitus without mention of complication, not stated as uncontrolled   . Vitamin D deficiency   . Wears glasses    Allergies: No Known Allergies   Review of Systems:  Review of Systems  Constitutional: Negative for chills, fever and malaise/fatigue.  HENT: Negative for congestion, ear pain and sore throat.   Respiratory: Negative for cough, shortness of breath and wheezing.   Cardiovascular: Negative for chest pain, palpitations and leg swelling.  Gastrointestinal: Negative for blood in stool, constipation, diarrhea, heartburn and melena.  Genitourinary: Negative.   Neurological: Negative for dizziness, sensory change, loss of consciousness and headaches.  Psychiatric/Behavioral: Negative for depression. The patient is not nervous/anxious and does not have insomnia.     Family history- Review and unchanged  Social history- Review and unchanged  Physical Exam: BP 130/70   Pulse 62   Temp 97.7 F (36.5 C)   Resp 16   Ht 5\' 10"  (1.778 m)   Wt 192 lb 3.2 oz (87.2 kg)   SpO2 97%   BMI 27.58 kg/m  Wt Readings from Last 3 Encounters:  03/02/17 192 lb 3.2 oz (87.2 kg)  02/16/17 192 lb (87.1 kg)  11/23/16 196 lb (88.9 kg)   General Appearance: Well nourished well developed, non-toxic appearing, in no apparent distress. Eyes: PERRLA, EOMs, conjunctiva no swelling or erythema ENT/Mouth: Ear canals clear with no erythema, swelling, or discharge.  TMs normal bilaterally, oropharynx clear, moist, with no exudate.   Neck: Supple, thyroid normal, no JVD, no cervical adenopathy.  Respiratory: Respiratory effort normal, breath sounds clear A&P, no wheeze, rhonchi or rales noted.  No retractions, no accessory muscle usage Cardio: RRR with no MRGs. No noted edema.  Abdomen: Soft, + BS.  Non tender, no guarding, rebound, hernias, masses. Musculoskeletal: Full ROM, 5/5 strength, Normal gait Skin: Warm, dry without rashes, lesions, ecchymosis.  Neuro:  Awake and oriented X 3, Cranial nerves intact. No cerebellar symptoms.  Psych: normal affect, Insight and Judgment appropriate.    Vicie Mutters, PA-C 11:04 AM [facility]

## 2017-03-03 LAB — HEMOGLOBIN A1C
HEMOGLOBIN A1C: 7 % — AB (ref <5.7)
Mean Plasma Glucose: 154 mg/dL

## 2017-03-03 LAB — TSH: TSH: 0.89 m[IU]/L (ref 0.40–4.50)

## 2017-03-03 LAB — MAGNESIUM: Magnesium: 2 mg/dL (ref 1.5–2.5)

## 2017-03-05 ENCOUNTER — Other Ambulatory Visit: Payer: Self-pay

## 2017-03-05 MED ORDER — ROSUVASTATIN CALCIUM 20 MG PO TABS: 20.0000 mg | ORAL_TABLET | Freq: Every day | ORAL | 1 refills | Status: DC

## 2017-03-05 NOTE — Progress Notes (Signed)
Pt aware of lab results & voiced understanding of those results. Pt reports he is taking his crestor & requested a refill.(sent to pharmacy)

## 2017-05-13 ENCOUNTER — Other Ambulatory Visit: Payer: Self-pay | Admitting: Internal Medicine

## 2017-06-15 DIAGNOSIS — M25561 Pain in right knee: Secondary | ICD-10-CM | POA: Diagnosis not present

## 2017-06-15 DIAGNOSIS — M25562 Pain in left knee: Secondary | ICD-10-CM | POA: Diagnosis not present

## 2017-06-21 ENCOUNTER — Other Ambulatory Visit: Payer: Self-pay | Admitting: Orthopedic Surgery

## 2017-06-21 ENCOUNTER — Other Ambulatory Visit: Payer: Self-pay

## 2017-06-21 ENCOUNTER — Encounter (HOSPITAL_BASED_OUTPATIENT_CLINIC_OR_DEPARTMENT_OTHER): Payer: Self-pay | Admitting: *Deleted

## 2017-06-22 ENCOUNTER — Ambulatory Visit: Payer: BLUE CROSS/BLUE SHIELD | Admitting: Physician Assistant

## 2017-06-22 ENCOUNTER — Encounter: Payer: Self-pay | Admitting: Physician Assistant

## 2017-06-22 ENCOUNTER — Encounter (HOSPITAL_BASED_OUTPATIENT_CLINIC_OR_DEPARTMENT_OTHER)
Admission: RE | Admit: 2017-06-22 | Discharge: 2017-06-22 | Disposition: A | Discharge status: Home / Self Care | Payer: BLUE CROSS/BLUE SHIELD | Source: Ambulatory Visit | Attending: Orthopedic Surgery | Admitting: Orthopedic Surgery

## 2017-06-22 VITALS — BP 118/76 | HR 67 | Temp 97.3°F | Resp 16 | Ht 70.0 in | Wt 192.0 lb

## 2017-06-22 DIAGNOSIS — I10 Essential (primary) hypertension: Secondary | ICD-10-CM | POA: Diagnosis not present

## 2017-06-22 DIAGNOSIS — J410 Simple chronic bronchitis: Secondary | ICD-10-CM

## 2017-06-22 DIAGNOSIS — G4733 Obstructive sleep apnea (adult) (pediatric): Secondary | ICD-10-CM | POA: Diagnosis not present

## 2017-06-22 DIAGNOSIS — E119 Type 2 diabetes mellitus without complications: Secondary | ICD-10-CM | POA: Diagnosis not present

## 2017-06-22 DIAGNOSIS — M6752 Plica syndrome, left knee: Secondary | ICD-10-CM | POA: Diagnosis not present

## 2017-06-22 DIAGNOSIS — F17210 Nicotine dependence, cigarettes, uncomplicated: Secondary | ICD-10-CM | POA: Diagnosis not present

## 2017-06-22 DIAGNOSIS — Z6827 Body mass index (BMI) 27.0-27.9, adult: Secondary | ICD-10-CM | POA: Diagnosis not present

## 2017-06-22 DIAGNOSIS — Z9889 Other specified postprocedural states: Secondary | ICD-10-CM | POA: Diagnosis not present

## 2017-06-22 DIAGNOSIS — Z7984 Long term (current) use of oral hypoglycemic drugs: Secondary | ICD-10-CM | POA: Diagnosis not present

## 2017-06-22 DIAGNOSIS — J449 Chronic obstructive pulmonary disease, unspecified: Secondary | ICD-10-CM

## 2017-06-22 DIAGNOSIS — I2510 Atherosclerotic heart disease of native coronary artery without angina pectoris: Secondary | ICD-10-CM | POA: Diagnosis not present

## 2017-06-22 DIAGNOSIS — Z23 Encounter for immunization: Secondary | ICD-10-CM

## 2017-06-22 DIAGNOSIS — Z955 Presence of coronary angioplasty implant and graft: Secondary | ICD-10-CM | POA: Diagnosis not present

## 2017-06-22 DIAGNOSIS — Z79899 Other long term (current) drug therapy: Secondary | ICD-10-CM

## 2017-06-22 DIAGNOSIS — M23203 Derangement of unspecified medial meniscus due to old tear or injury, right knee: Secondary | ICD-10-CM | POA: Diagnosis not present

## 2017-06-22 DIAGNOSIS — E559 Vitamin D deficiency, unspecified: Secondary | ICD-10-CM | POA: Diagnosis not present

## 2017-06-22 DIAGNOSIS — Z8042 Family history of malignant neoplasm of prostate: Secondary | ICD-10-CM | POA: Diagnosis not present

## 2017-06-22 DIAGNOSIS — E782 Mixed hyperlipidemia: Secondary | ICD-10-CM | POA: Diagnosis not present

## 2017-06-22 DIAGNOSIS — Z823 Family history of stroke: Secondary | ICD-10-CM | POA: Diagnosis not present

## 2017-06-22 DIAGNOSIS — M23204 Derangement of unspecified medial meniscus due to old tear or injury, left knee: Secondary | ICD-10-CM | POA: Diagnosis not present

## 2017-06-22 DIAGNOSIS — Z7951 Long term (current) use of inhaled steroids: Secondary | ICD-10-CM | POA: Diagnosis not present

## 2017-06-22 DIAGNOSIS — E785 Hyperlipidemia, unspecified: Secondary | ICD-10-CM | POA: Diagnosis not present

## 2017-06-22 DIAGNOSIS — Z7982 Long term (current) use of aspirin: Secondary | ICD-10-CM | POA: Diagnosis not present

## 2017-06-22 DIAGNOSIS — Z8249 Family history of ischemic heart disease and other diseases of the circulatory system: Secondary | ICD-10-CM | POA: Diagnosis not present

## 2017-06-22 DIAGNOSIS — E669 Obesity, unspecified: Secondary | ICD-10-CM | POA: Diagnosis not present

## 2017-06-22 LAB — BASIC METABOLIC PANEL
ANION GAP: 8 (ref 5–15)
BUN: 10 mg/dL (ref 6–20)
CALCIUM: 9.2 mg/dL (ref 8.9–10.3)
CHLORIDE: 104 mmol/L (ref 101–111)
CO2: 25 mmol/L (ref 22–32)
Creatinine, Ser: 0.72 mg/dL (ref 0.61–1.24)
GFR calc non Af Amer: >60 mL/min (ref >60)
GLUCOSE: 132 mg/dL — AB (ref 65–99)
POTASSIUM: 4.2 mmol/L (ref 3.5–5.1)
Sodium: 137 mmol/L (ref 135–145)

## 2017-06-22 MED ORDER — BENZONATATE 100 MG PO CAPS: 100.0000 mg | ORAL_CAPSULE | Freq: Three times a day (TID) | ORAL | 2 refills | Status: DC | PRN

## 2017-06-22 NOTE — Progress Notes (Signed)
Patient ID: Joe Green, male   DOB: [birth year], 66 y.o.   MRN: [id number]  Assessment and Plan:   Needs flu shot -     Flu vaccine HIGH DOSE PF  Essential hypertension - continue medications, DASH diet, exercise and monitor at home. Call if greater than 130/80.  -     CBC with Differential/Platelet -     BASIC METABOLIC PANEL WITH GFR -     Hepatic function panel -     TSH  Chronic obstructive pulmonary disease, unspecified COPD type ([place]) Advised to stop smoking, will get CXR, continue meds.   OSA and COPD overlap syndrome (HCC) Continue CPAP  Type 2 diabetes mellitus without complication, without long-term current use of insulin (HCC) -     Hemoglobin A1c Discussed general issues about diabetes pathophysiology and management., Educational material distributed., Suggested low cholesterol diet., Encouraged aerobic exercise., Discussed foot care., Reminded to get yearly retinal exam.  Mixed hyperlipidemia -continue medications, check lipids, decrease fatty foods, increase activity. -     Lipid panel  Smokers' cough (HCC) -     benzonatate (TESSALON PERLES) 100 MG capsule; Take 1 capsule (100 mg total) 3 (three) times daily as needed by mouth for cough (Max: 600mg  per day). Advised to quit smoking   Continue diet and meds as discussed. Further disposition pending results of labs. Discussed med's effects and SE's.   Future Appointments  Date Time Provider [place]  11/21/2017  9:00 AM Vicie Mutters, PA-C [facility] None    HPI 66 y.o. male  presents for 3 month follow up with hypertension, hyperlipidemia, diabetes and vitamin D deficiency.   His blood pressure has been controlled at home, today their BP is BP: 118/76.He does workout. He denies chest pain, shortness of breath, dizziness. He is a smoker, he would like to quit, having bilateral knee arthroscopy, Monday, going to stop after this, states does not need help. Had normal stress test 2018, carotid US 2012.     He is on cholesterol medication and denies myalgias. His cholesterol is at goal. The cholesterol was:  03/02/2017: Cholesterol 173; HDL 41; LDL Cholesterol 115; Triglycerides 86   He has been working on diet and exercise for diabetes without complications, he is on bASA, he is on ACE/ARB, and denies  foot ulcerations, hyperglycemia, hypoglycemia , increased appetite, nausea, paresthesia of the feet, polydipsia, polyuria, visual disturbances, vomiting and weight loss. He is on MF and farxiga, he checks his sugars 3 x a week, sugars running 110-140s in AM. Last A1C was: 03/02/2017: Hgb A1c MFr Bld 7.0   Patient is on Vitamin D supplement. 11/21/2016: Vit D, 25-Hydroxy 68   BMI is Body mass index is 27.55 kg/m., he is working on diet and exercise. Wt Readings from Last 3 Encounters:  06/22/17 192 lb (87.1 kg)  03/02/17 192 lb 3.2 oz (87.2 kg)  02/16/17 192 lb (87.1 kg)    Current Medications:  Current Outpatient Medications on File Prior to Visit  Medication Sig Dispense Refill  . aspirin EC 81 MG tablet Take 1 tablet (81 mg total) by mouth daily. 90 tablet 3  . Cholecalciferol (VITAMIN D PO) Take 5,000 mg by mouth daily.    . dapagliflozin propanediol (FARXIGA) 10 MG TABS tablet Take 10 mg by mouth daily. 90 tablet 1  . doxazosin (CARDURA) 8 MG tablet TAKE ONE TABLET BY MOUTH ONCE DAILY 90 tablet 1  . fluticasone furoate-vilanterol (BREO ELLIPTA) 100-25 MCG/INH AEPB Inhale 1 puff into the  lungs daily. Rinse mouth with water after each use 1 each 3  . glucose blood test strip Test blood sugars once daily.Dx: E11.9 100 each 2  . metFORMIN (GLUCOPHAGE) 1000 MG tablet Take 1 tablet (1,000 mg total) by mouth 2 (two) times daily. 180 tablet 1  . ramipril (ALTACE) 5 MG capsule TAKE 1 CAPSULE BY MOUTH ONCE DAILY 90 capsule 1  . rosuvastatin (CRESTOR) 20 MG tablet Take 1 tablet (20 mg total) by mouth daily. 90 tablet 1   No current facility-administered medications on file prior to visit.     Medical History:  Past Medical History:  Diagnosis Date  . CAD (coronary artery disease)    a. s/p stent to RCA 2005; b. LHC 2006 after Nuc with inf ischemia: patent RCA stent, D1 80%, LAD 60% => med Rx;  c. LHC 04/18/11: Proximal LAD 60% (unchanged from previous catheterization), distal LAD 65% (small caliber), Dx 60%, proximal CFX 20%, mid CFX 50%, OM1 30%, proximal RCA 20%, mid RCA stent patent, EF 65%.=> med Rx  . GERD (gastroesophageal reflux disease)   . HLD (hyperlipidemia)   . Hypertension   . Obesity   . OSA (obstructive sleep apnea)    has a cpap-does not use-has a special mouthpiece he wears at night  . Type II or unspecified type diabetes mellitus without mention of complication, not stated as uncontrolled   . Vitamin D deficiency   . Wears glasses    Allergies: No Known Allergies   Review of Systems:  Review of Systems  Constitutional: Negative for chills, fever and malaise/fatigue.  HENT: Negative for congestion, ear pain and sore throat.   Respiratory: Negative for cough, shortness of breath and wheezing.   Cardiovascular: Negative for chest pain, palpitations and leg swelling.  Gastrointestinal: Negative for blood in stool, constipation, diarrhea, heartburn and melena.  Genitourinary: Negative.   Musculoskeletal: Positive for joint pain (bilateral knees).  Neurological: Negative for dizziness, sensory change, loss of consciousness and headaches.  Psychiatric/Behavioral: Negative for depression. The patient is not nervous/anxious and does not have insomnia.     Family history- Review and unchanged  Social history- Review and unchanged  Physical Exam: BP 118/76   Pulse 67   Temp (!) 97.3 F (36.3 C)   Resp 16   Ht 5\' 10"  (1.778 m)   Wt 192 lb (87.1 kg)   SpO2 99%   BMI 27.55 kg/m  Wt Readings from Last 3 Encounters:  06/22/17 192 lb (87.1 kg)  03/02/17 192 lb 3.2 oz (87.2 kg)  02/16/17 192 lb (87.1 kg)   General Appearance: Well nourished well  developed, non-toxic appearing, in no apparent distress. Eyes: PERRLA, EOMs, conjunctiva no swelling or erythema ENT/Mouth: Ear canals clear with no erythema, swelling, or discharge.  TMs normal bilaterally, oropharynx clear, moist, with no exudate.   Neck: Supple, thyroid normal, no JVD, no cervical adenopathy.  Respiratory: Respiratory effort normal, breath sounds clear A&P, no wheeze, rhonchi or rales noted.  No retractions, no accessory muscle usage Cardio: RRR with no MRGs. No noted edema.  Abdomen: Soft, + BS.  Non tender, no guarding, rebound, hernias, masses. Musculoskeletal: Full ROM, 5/5 strength, antalgic gait, walking with cane.  Skin: Warm, dry without rashes, lesions, ecchymosis.  Neuro: Awake and oriented X 3, Cranial nerves intact. No cerebellar symptoms.  Psych: normal affect, Insight and Judgment appropriate.    Vicie Mutters, PA-C 10:23 AM [facility]

## 2017-06-22 NOTE — Patient Instructions (Signed)
If you have a smart phone, please look up Smoke Free app, this will help you stay on track and give you information about money you have saved, life that you have gained back and a ton of more information.   We are giving you chantix for smoking cessation. You can do it! And we are here to help! You may have heard some scary side effects about chantix, the three most common I hear about are nausea, crazy dreams and depression.  However, I like for my patients to try to stay on 1/2 a tablet twice a day rather than one tablet twice a day as normally prescribed. This helps decrease the chances of side effects and helps save money by making a one month prescription last two months  Please start the prescription this way:  Start 1/2 tablet by mouth once daily after food with a full glass of water for 3 days Then do 1/2 tablet by mouth twice daily for 4 days. During this first week you can smoke, but try to stop after this week.  At this point we have several options: 1) continue on 1/2 tablet twice a day- which I encourage you to do. You can stay on this dose the rest of the time on the medication or if you still feel the need to smoke you can do one of the two options below. 2) do one tablet in the morning and 1/2 in the evening which helps decrease dreams. 3) do one tablet twice a day.   What if I miss a dose? If you miss a dose, take it as soon as you can. If it is almost time for your next dose, take only that dose. Do not take double or extra doses.  What should I watch for while using this medicine? Visit your doctor or health care professional for regular check ups. Ask for ongoing advice and encouragement from your doctor or healthcare professional, friends, and family to help you quit. If you smoke while on this medication, quit again  Your mouth may get dry. Chewing sugarless gum or hard candy, and drinking plenty of water may help. Contact your doctor if the problem does not go away or is  severe.  You may get drowsy or dizzy. Do not drive, use machinery, or do anything that needs mental alertness until you know how this medicine affects you. Do not stand or sit up quickly, especially if you are an older patient.   The use of this medicine may increase the chance of suicidal thoughts or actions. Pay special attention to how you are responding while on this medicine. Any worsening of mood, or thoughts of suicide or dying should be reported to your health care professional right away.  ADVANTAGES OF QUITTING SMOKING  Within 20 minutes, blood pressure decreases. Your pulse is at normal level.  After 8 hours, carbon monoxide levels in the blood return to normal. Your oxygen level increases.  After 24 hours, the chance of having a heart attack starts to decrease. Your breath, hair, and body stop smelling like smoke.  After 48 hours, damaged nerve endings begin to recover. Your sense of taste and smell improve.  After 72 hours, the body is virtually free of nicotine. Your bronchial tubes relax and breathing becomes easier.  After 2 to 12 weeks, lungs can hold more air. Exercise becomes easier and circulation improves.  After 1 year, the risk of coronary heart disease is cut in half.  After 5 years,   the risk of stroke falls to the same as a nonsmoker.  After 10 years, the risk of lung cancer is cut in half and the risk of other cancers decreases significantly.  After 15 years, the risk of coronary heart disease drops, usually to the level of a nonsmoker.  You will have extra money to spend on things other than cigarettes.  

## 2017-06-23 LAB — CBC WITH DIFFERENTIAL/PLATELET
BASOS PCT: 0.5 %
Basophils Absolute: 52 cells/uL (ref 0–200)
EOS PCT: 3.5 %
Eosinophils Absolute: 364 cells/uL (ref 15–500)
HCT: 48.2 % (ref 38.5–50.0)
HEMOGLOBIN: 16.7 g/dL (ref 13.2–17.1)
Lymphs Abs: 1706 cells/uL (ref 850–3900)
MCH: 31.6 pg (ref 27.0–33.0)
MCHC: 34.6 g/dL (ref 32.0–36.0)
MCV: 91.1 fL (ref 80.0–100.0)
MONOS PCT: 7.1 %
MPV: 11.5 fL (ref 7.5–12.5)
NEUTROS ABS: 7540 {cells}/uL (ref 1500–7800)
Neutrophils Relative %: 72.5 %
PLATELETS: 159 10*3/uL (ref 140–400)
RBC: 5.29 10*6/uL (ref 4.20–5.80)
RDW: 12.1 % (ref 11.0–15.0)
Total Lymphocyte: 16.4 %
WBC mixed population: 738 cells/uL (ref 200–950)
WBC: 10.4 10*3/uL (ref 3.8–10.8)

## 2017-06-23 LAB — HEPATIC FUNCTION PANEL
AG RATIO: 1.8 (calc) (ref 1.0–2.5)
ALKALINE PHOSPHATASE (APISO): 83 U/L (ref 40–115)
ALT: 19 U/L (ref 9–46)
AST: 14 U/L (ref 10–35)
Albumin: 4.3 g/dL (ref 3.6–5.1)
Bilirubin, Direct: 0.2 mg/dL (ref 0.0–0.2)
GLOBULIN: 2.4 g/dL (ref 1.9–3.7)
Indirect Bilirubin: 0.5 mg/dL (calc) (ref 0.2–1.2)
TOTAL PROTEIN: 6.7 g/dL (ref 6.1–8.1)
Total Bilirubin: 0.7 mg/dL (ref 0.2–1.2)

## 2017-06-23 LAB — BASIC METABOLIC PANEL WITH GFR
BUN: 11 mg/dL (ref 7–25)
CHLORIDE: 104 mmol/L (ref 98–110)
CO2: 25 mmol/L (ref 20–32)
CREATININE: 0.75 mg/dL (ref 0.70–1.25)
Calcium: 9.4 mg/dL (ref 8.6–10.3)
GFR, Est African American: 112 mL/min/{1.73_m2} (ref >=60)
GFR, Est Non African American: 96 mL/min/{1.73_m2} (ref >=60)
GLUCOSE: 141 mg/dL — AB (ref 65–99)
Potassium: 4.2 mmol/L (ref 3.5–5.3)
SODIUM: 138 mmol/L (ref 135–146)

## 2017-06-23 LAB — LIPID PANEL
CHOL/HDL RATIO: 2.7 (calc) (ref <5.0)
CHOLESTEROL: 131 mg/dL (ref <200)
HDL: 49 mg/dL (ref >40)
LDL Cholesterol (Calc): 69 mg/dL (calc)
Non-HDL Cholesterol (Calc): 82 mg/dL (calc) (ref <130)
Triglycerides: 58 mg/dL (ref <150)

## 2017-06-23 LAB — HEMOGLOBIN A1C
EAG (MMOL/L): 8.4 (calc)
Hgb A1c MFr Bld: 6.9 % of total Hgb — ABNORMAL HIGH (ref <5.7)
Mean Plasma Glucose: 151 (calc)

## 2017-06-23 LAB — TSH: TSH: 0.89 mIU/L (ref 0.40–4.50)

## 2017-06-25 ENCOUNTER — Other Ambulatory Visit: Payer: Self-pay

## 2017-06-25 ENCOUNTER — Ambulatory Visit (HOSPITAL_BASED_OUTPATIENT_CLINIC_OR_DEPARTMENT_OTHER): Payer: BLUE CROSS/BLUE SHIELD | Admitting: Anesthesiology

## 2017-06-25 ENCOUNTER — Encounter (HOSPITAL_BASED_OUTPATIENT_CLINIC_OR_DEPARTMENT_OTHER)
Admission: RE | Disposition: A | Discharge status: Home / Self Care | Payer: Self-pay | Source: Ambulatory Visit | Attending: Orthopedic Surgery

## 2017-06-25 ENCOUNTER — Encounter (HOSPITAL_BASED_OUTPATIENT_CLINIC_OR_DEPARTMENT_OTHER): Payer: Self-pay | Admitting: *Deleted

## 2017-06-25 ENCOUNTER — Ambulatory Visit (HOSPITAL_BASED_OUTPATIENT_CLINIC_OR_DEPARTMENT_OTHER)
Admission: RE | Admit: 2017-06-25 | Discharge: 2017-06-25 | Disposition: A | Discharge status: Home / Self Care | Payer: BLUE CROSS/BLUE SHIELD | Source: Ambulatory Visit | Attending: Orthopedic Surgery | Admitting: Orthopedic Surgery

## 2017-06-25 DIAGNOSIS — Z8249 Family history of ischemic heart disease and other diseases of the circulatory system: Secondary | ICD-10-CM | POA: Insufficient documentation

## 2017-06-25 DIAGNOSIS — G4733 Obstructive sleep apnea (adult) (pediatric): Secondary | ICD-10-CM | POA: Insufficient documentation

## 2017-06-25 DIAGNOSIS — Z7951 Long term (current) use of inhaled steroids: Secondary | ICD-10-CM | POA: Insufficient documentation

## 2017-06-25 DIAGNOSIS — Z9889 Other specified postprocedural states: Secondary | ICD-10-CM | POA: Diagnosis not present

## 2017-06-25 DIAGNOSIS — E785 Hyperlipidemia, unspecified: Secondary | ICD-10-CM | POA: Diagnosis not present

## 2017-06-25 DIAGNOSIS — E559 Vitamin D deficiency, unspecified: Secondary | ICD-10-CM | POA: Diagnosis not present

## 2017-06-25 DIAGNOSIS — Z6827 Body mass index (BMI) 27.0-27.9, adult: Secondary | ICD-10-CM | POA: Insufficient documentation

## 2017-06-25 DIAGNOSIS — S83242A Other tear of medial meniscus, current injury, left knee, initial encounter: Secondary | ICD-10-CM | POA: Diagnosis present

## 2017-06-25 DIAGNOSIS — Z955 Presence of coronary angioplasty implant and graft: Secondary | ICD-10-CM | POA: Diagnosis not present

## 2017-06-25 DIAGNOSIS — F17210 Nicotine dependence, cigarettes, uncomplicated: Secondary | ICD-10-CM | POA: Insufficient documentation

## 2017-06-25 DIAGNOSIS — Z8042 Family history of malignant neoplasm of prostate: Secondary | ICD-10-CM | POA: Insufficient documentation

## 2017-06-25 DIAGNOSIS — M6752 Plica syndrome, left knee: Secondary | ICD-10-CM | POA: Insufficient documentation

## 2017-06-25 DIAGNOSIS — S83241A Other tear of medial meniscus, current injury, right knee, initial encounter: Secondary | ICD-10-CM

## 2017-06-25 DIAGNOSIS — S83200A Bucket-handle tear of unspecified meniscus, current injury, right knee, initial encounter: Secondary | ICD-10-CM | POA: Diagnosis not present

## 2017-06-25 DIAGNOSIS — I2510 Atherosclerotic heart disease of native coronary artery without angina pectoris: Secondary | ICD-10-CM | POA: Insufficient documentation

## 2017-06-25 DIAGNOSIS — S83232A Complex tear of medial meniscus, current injury, left knee, initial encounter: Secondary | ICD-10-CM | POA: Diagnosis not present

## 2017-06-25 DIAGNOSIS — Z79899 Other long term (current) drug therapy: Secondary | ICD-10-CM | POA: Insufficient documentation

## 2017-06-25 DIAGNOSIS — M23204 Derangement of unspecified medial meniscus due to old tear or injury, left knee: Secondary | ICD-10-CM | POA: Insufficient documentation

## 2017-06-25 DIAGNOSIS — S83211A Bucket-handle tear of medial meniscus, current injury, right knee, initial encounter: Secondary | ICD-10-CM

## 2017-06-25 DIAGNOSIS — M94261 Chondromalacia, right knee: Secondary | ICD-10-CM | POA: Diagnosis present

## 2017-06-25 DIAGNOSIS — M23203 Derangement of unspecified medial meniscus due to old tear or injury, right knee: Secondary | ICD-10-CM | POA: Diagnosis not present

## 2017-06-25 DIAGNOSIS — E119 Type 2 diabetes mellitus without complications: Secondary | ICD-10-CM

## 2017-06-25 DIAGNOSIS — I10 Essential (primary) hypertension: Secondary | ICD-10-CM | POA: Insufficient documentation

## 2017-06-25 DIAGNOSIS — Z823 Family history of stroke: Secondary | ICD-10-CM | POA: Insufficient documentation

## 2017-06-25 DIAGNOSIS — E669 Obesity, unspecified: Secondary | ICD-10-CM | POA: Insufficient documentation

## 2017-06-25 DIAGNOSIS — Z7984 Long term (current) use of oral hypoglycemic drugs: Secondary | ICD-10-CM | POA: Insufficient documentation

## 2017-06-25 DIAGNOSIS — M170 Bilateral primary osteoarthritis of knee: Secondary | ICD-10-CM | POA: Diagnosis not present

## 2017-06-25 DIAGNOSIS — Z7982 Long term (current) use of aspirin: Secondary | ICD-10-CM | POA: Diagnosis not present

## 2017-06-25 DIAGNOSIS — M94262 Chondromalacia, left knee: Secondary | ICD-10-CM | POA: Diagnosis present

## 2017-06-25 HISTORY — PX: CHONDROPLASTY: SHX5177

## 2017-06-25 HISTORY — PX: KNEE ARTHROSCOPY WITH MEDIAL MENISECTOMY: SHX5651

## 2017-06-25 LAB — GLUCOSE, CAPILLARY
GLUCOSE-CAPILLARY: 123 mg/dL — AB (ref 65–99)
Glucose-Capillary: 139 mg/dL — ABNORMAL HIGH (ref 65–99)

## 2017-06-25 SURGERY — ARTHROSCOPY, KNEE, WITH MEDIAL MENISCECTOMY
Anesthesia: General | Site: Knee | Laterality: Bilateral

## 2017-06-25 MED ORDER — LIDOCAINE 2% (20 MG/ML) 5 ML SYRINGE
INTRAMUSCULAR | Status: AC
  Filled 2017-06-25: qty 5

## 2017-06-25 MED ORDER — BLOOD GLUCOSE METER KIT: PACK | 0 refills | Status: DC

## 2017-06-25 MED ORDER — BUPIVACAINE HCL (PF) 0.25 % IJ SOLN
INTRAMUSCULAR | Status: DC | PRN
  Administered 2017-06-25 (×2): 20 mL

## 2017-06-25 MED ORDER — OXYCODONE-ACETAMINOPHEN 5-325 MG PO TABS: 1.0000 | ORAL_TABLET | Freq: Four times a day (QID) | ORAL | 0 refills | Status: DC | PRN

## 2017-06-25 MED ORDER — CHLORHEXIDINE GLUCONATE 4 % EX LIQD: 60.0000 mL | Freq: Once | CUTANEOUS | Status: DC

## 2017-06-25 MED ORDER — ONDANSETRON HCL 4 MG/2ML IJ SOLN
INTRAMUSCULAR | Status: DC | PRN
  Administered 2017-06-25: 4 mg via INTRAVENOUS

## 2017-06-25 MED ORDER — MIDAZOLAM HCL 2 MG/2ML IJ SOLN
1.0000 mg | INTRAMUSCULAR | Status: DC | PRN
  Administered 2017-06-25: 2 mg via INTRAVENOUS

## 2017-06-25 MED ORDER — SODIUM CHLORIDE 0.9 % IR SOLN
Status: DC | PRN
  Administered 2017-06-25: 1 mL

## 2017-06-25 MED ORDER — BUPIVACAINE HCL (PF) 0.5 % IJ SOLN
INTRAMUSCULAR | Status: AC
  Filled 2017-06-25: qty 60

## 2017-06-25 MED ORDER — LACTATED RINGERS IV SOLN
INTRAVENOUS | Status: DC
  Administered 2017-06-25 (×2): via INTRAVENOUS

## 2017-06-25 MED ORDER — CEFAZOLIN SODIUM-DEXTROSE 2-4 GM/100ML-% IV SOLN
INTRAVENOUS | Status: AC
  Filled 2017-06-25: qty 100

## 2017-06-25 MED ORDER — PROPOFOL 10 MG/ML IV BOLUS
INTRAVENOUS | Status: DC | PRN
  Administered 2017-06-25: 200 mg via INTRAVENOUS

## 2017-06-25 MED ORDER — FENTANYL CITRATE (PF) 100 MCG/2ML IJ SOLN
50.0000 ug | INTRAMUSCULAR | Status: AC | PRN
  Administered 2017-06-25: 100 ug via INTRAVENOUS
  Administered 2017-06-25 (×2): 25 ug via INTRAVENOUS
  Administered 2017-06-25: 50 ug via INTRAVENOUS

## 2017-06-25 MED ORDER — POVIDONE-IODINE 10 % EX SWAB: 2.0000 "application " | Freq: Once | CUTANEOUS | Status: DC

## 2017-06-25 MED ORDER — FENTANYL CITRATE (PF) 100 MCG/2ML IJ SOLN
INTRAMUSCULAR | Status: AC
  Filled 2017-06-25: qty 2

## 2017-06-25 MED ORDER — CEFAZOLIN SODIUM-DEXTROSE 2-4 GM/100ML-% IV SOLN
2.0000 g | INTRAVENOUS | Status: AC
  Administered 2017-06-25: 2 g via INTRAVENOUS

## 2017-06-25 MED ORDER — DEXAMETHASONE SODIUM PHOSPHATE 4 MG/ML IJ SOLN
INTRAMUSCULAR | Status: DC | PRN
  Administered 2017-06-25: 10 mg via INTRAVENOUS

## 2017-06-25 MED ORDER — DEXAMETHASONE SODIUM PHOSPHATE 10 MG/ML IJ SOLN
INTRAMUSCULAR | Status: AC
  Filled 2017-06-25: qty 1

## 2017-06-25 MED ORDER — BUPIVACAINE HCL (PF) 0.25 % IJ SOLN
INTRAMUSCULAR | Status: AC
  Filled 2017-06-25: qty 60

## 2017-06-25 MED ORDER — EPINEPHRINE 30 MG/30ML IJ SOLN
INTRAMUSCULAR | Status: AC
  Filled 2017-06-25: qty 1

## 2017-06-25 MED ORDER — SCOPOLAMINE 1 MG/3DAYS TD PT72: 1.0000 | MEDICATED_PATCH | Freq: Once | TRANSDERMAL | Status: DC | PRN

## 2017-06-25 MED ORDER — ONDANSETRON HCL 4 MG/2ML IJ SOLN
INTRAMUSCULAR | Status: AC
  Filled 2017-06-25: qty 2

## 2017-06-25 MED ORDER — LIDOCAINE HCL (CARDIAC) 20 MG/ML IV SOLN
INTRAVENOUS | Status: DC | PRN
  Administered 2017-06-25: 100 mg via INTRAVENOUS

## 2017-06-25 MED ORDER — MIDAZOLAM HCL 2 MG/2ML IJ SOLN
INTRAMUSCULAR | Status: AC
  Filled 2017-06-25: qty 2

## 2017-06-25 SURGICAL SUPPLY — 42 items
BANDAGE ACE 6X5 VEL STRL LF (GAUZE/BANDAGES/DRESSINGS) ×8 IMPLANT
BLADE 4.2CUDA (BLADE) IMPLANT
BLADE GREAT WHITE 4.2 (BLADE) ×4 IMPLANT
BNDG COHESIVE 4X5 TAN STRL (GAUZE/BANDAGES/DRESSINGS) ×4 IMPLANT
CUTTER MENISCUS  4.2MM (BLADE)
CUTTER MENISCUS 4.2MM (BLADE) IMPLANT
DRAPE ARTHROSCOPY W/POUCH 114 (DRAPES) ×8 IMPLANT
DRSG EMULSION OIL 3X3 NADH (GAUZE/BANDAGES/DRESSINGS) ×8 IMPLANT
DURAPREP 26ML APPLICATOR (WOUND CARE) ×8 IMPLANT
ELECT MENISCUS 165MM 90D (ELECTRODE) IMPLANT
ELECT REM PT RETURN 9FT ADLT (ELECTROSURGICAL) ×4
ELECTRODE REM PT RTRN 9FT ADLT (ELECTROSURGICAL) ×1 IMPLANT
GAUZE SPONGE 4X4 12PLY STRL (GAUZE/BANDAGES/DRESSINGS) ×8 IMPLANT
GLOVE BIO SURGEON STRL SZ 6.5 (GLOVE) ×2 IMPLANT
GLOVE BIOGEL PI IND STRL 7.0 (GLOVE) ×2 IMPLANT
GLOVE BIOGEL PI IND STRL 8 (GLOVE) ×6 IMPLANT
GLOVE BIOGEL PI INDICATOR 7.0 (GLOVE) ×2
GLOVE BIOGEL PI INDICATOR 8 (GLOVE) ×2
GLOVE ECLIPSE 7.5 STRL STRAW (GLOVE) ×10 IMPLANT
GOWN STRL REUS W/ TWL LRG LVL3 (GOWN DISPOSABLE) ×3 IMPLANT
GOWN STRL REUS W/ TWL XL LVL3 (GOWN DISPOSABLE) ×3 IMPLANT
GOWN STRL REUS W/TWL LRG LVL3 (GOWN DISPOSABLE) ×4
GOWN STRL REUS W/TWL XL LVL3 (GOWN DISPOSABLE) ×8 IMPLANT
HOLDER KNEE FOAM BLUE (MISCELLANEOUS) ×8 IMPLANT
IV NS IRRIG 3000ML ARTHROMATIC (IV SOLUTION) ×6 IMPLANT
KNEE WRAP E Z 3 GEL PACK (MISCELLANEOUS) ×8 IMPLANT
MANIFOLD NEPTUNE II (INSTRUMENTS) IMPLANT
NDL SAFETY ECLIPSE 18X1.5 (NEEDLE) IMPLANT
NEEDLE HYPO 18GX1.5 SHARP (NEEDLE)
PACK ARTHROSCOPY DSU (CUSTOM PROCEDURE TRAY) ×4 IMPLANT
PACK BASIN DAY SURGERY FS (CUSTOM PROCEDURE TRAY) ×4 IMPLANT
PAD CAST 4YDX4 CTTN HI CHSV (CAST SUPPLIES) ×6 IMPLANT
PADDING CAST COTTON 4X4 STRL (CAST SUPPLIES) ×8
PENCIL BUTTON HOLSTER BLD 10FT (ELECTRODE) IMPLANT
SET ARTHROSCOPY TUBING (MISCELLANEOUS) ×4
SET ARTHROSCOPY TUBING LN (MISCELLANEOUS) ×3 IMPLANT
STOCKINETTE IMPERVIOUS LG (DRAPES) ×4 IMPLANT
SUT ETHILON 4 0 PS 2 18 (SUTURE) IMPLANT
SYR 5ML LL (SYRINGE) ×4 IMPLANT
TOWEL OR 17X24 6PK STRL BLUE (TOWEL DISPOSABLE) ×4 IMPLANT
TOWEL OR NON WOVEN STRL DISP B (DISPOSABLE) ×4 IMPLANT
WATER STERILE IRR 1000ML POUR (IV SOLUTION) ×4 IMPLANT

## 2017-06-25 NOTE — Anesthesia Procedure Notes (Signed)
Procedure Name: LMA Insertion Date/Time: 06/25/2017 1:58 PM Performed by: Lyndee Leo, CRNA Pre-anesthesia Checklist: Patient identified, Emergency Drugs available, Suction available and Patient being monitored Patient Re-evaluated:Patient Re-evaluated prior to induction Oxygen Delivery Method: Circle system utilized Preoxygenation: Pre-oxygenation with 100% oxygen Induction Type: IV induction Ventilation: Mask ventilation without difficulty LMA: LMA inserted LMA Size: 4.0 Number of attempts: 1 Airway Equipment and Method: Bite block Placement Confirmation: positive ETCO2 Tube secured with: Tape Dental Injury: Teeth and Oropharynx as per pre-operative assessment

## 2017-06-25 NOTE — Discharge Instructions (Signed)
POST-OP KNEE ARTHROSCOPY INSTRUCTIONS  °Dr. John Graves/Jim Bethune PA-C ° °Pain °You will be expected to have a moderate amount of pain in the affected knee for approximately two weeks. However, the first two days will be the most severe pain. A prescription has been provided to take as needed for the pain. The pain can be reduced by applying ice packs to the knee for the first 1-2 weeks post surgery. Also, keeping the leg elevated on pillows will help alleviate the pain. If you develop any acute pain or swelling in your calf muscle, please call the doctor. ° °Activity °It is preferred that you stay at bed rest for approximately 24 hours. However, you may go to the bathroom with help. Weight bearing as tolerated. You may begin the knee exercises the day of surgery. Discontinue crutches as the knee pain resolves. ° °Dressing °Keep the dressing dry. If the ace bandage should wrinkle or roll up, this can be rewrapped to prevent ridges in the bandage. You may remove all dressings in 48 hours,  apply bandaids to each wound. You may shower on the 4th day after surgery but no tub bath. ° °Symptoms to report to your doctor °Extreme pain °Extreme swelling °Temperature above 101 degrees °Change in the feeling, color, or movement of your toes °Redness, heat, or swelling at your incision ° °Exercise °If is preferred that as soon as possible you try to do a straight leg raise without bending the knee and concentrate on bringing the heel of your foot off the bed up to approximately 45 degrees and hold for the count of 10 seconds. Repeat this at least 10 times three or four times per day. Additional exercises are provided below. ° °You are encouraged to bend the knee as tolerated. ° °Follow-Up °Call to schedule a follow-up appointment in 5-7 days. Our office # is [phone]. ° °POST-OP EXERCISES ° °Short Arc Quads ° °1. Lie on back with legs straight. Place towel roll under thigh, just above knee. °2. Tighten thigh muscles to  straighten knee and lift heel off bed. °3. Hold for slow count of five, then lower. °4. Do three sets of ten ° ° ° °Straight Leg Raises ° °1. Lie on back with operative leg straight and other leg bent. °2. Keeping operative leg completely straight, slowly lift operative leg so foot is 5 inches off bed. °3. Hold for slow count of five, then lower. °4. Do three sets of ten. ° ° ° °DO BOTH EXERCISES 2 TIMES A DAY ° °Ankle Pumps ° °Work/move the operative ankle and foot up and down 10 times every hour while awake. ° ° °Post Anesthesia Home Care Instructions ° °Activity: °Get plenty of rest for the remainder of the day. A responsible individual must stay with you for 24 hours following the procedure.  °For the next 24 hours, DO NOT: °-Drive a car °-Operate machinery °-Drink alcoholic beverages °-Take any medication unless instructed by your physician °-Make any legal decisions or sign important papers. ° °Meals: °Start with liquid foods such as gelatin or soup. Progress to regular foods as tolerated. Avoid greasy, spicy, heavy foods. If nausea and/or vomiting occur, drink only clear liquids until the nausea and/or vomiting subsides. Call your physician if vomiting continues. ° °Special Instructions/Symptoms: °Your throat may feel dry or sore from the anesthesia or the breathing tube placed in your throat during surgery. If this causes discomfort, gargle with warm salt water. The discomfort should disappear within 24 hours. ° °If you   had a scopolamine patch placed behind your ear for the management of post- operative nausea and/or vomiting: ° °1. The medication in the patch is effective for 72 hours, after which it should be removed.  Wrap patch in a tissue and discard in the trash. Wash hands thoroughly with soap and water. °2. You may remove the patch earlier than 72 hours if you experience unpleasant side effects which may include dry mouth, dizziness or visual disturbances. °3. Avoid touching the patch. Wash your  hands with soap and water after contact with the patch. °  ° °

## 2017-06-25 NOTE — Transfer of Care (Signed)
Immediate Anesthesia Transfer of Care Note  Patient: Joe Green  Procedure(s) Performed: right knee arthroscopy with medial meniscectomy, chondroplasty, Left knee arthroscopy with medial menisectomy removal of plica and chondroplasty (Bilateral Knee)  Patient Location: PACU  Anesthesia Type:General  Level of Consciousness: awake, sedated and patient cooperative  Airway & Oxygen Therapy: Patient Spontanous Breathing and Patient connected to face mask oxygen  Post-op Assessment: Report given to RN and Post -op Vital signs reviewed and stable  Post vital signs: Reviewed and stable  Last Vitals:  Vitals:   06/25/17 1120  BP: (!) 149/78  Pulse: 88  Resp: 16  Temp: 36.7 C  SpO2: 98%    Last Pain:  Vitals:   06/25/17 1120  TempSrc: Oral         Complications: No apparent anesthesia complications

## 2017-06-25 NOTE — H&P (Signed)
PREOPERATIVE H&P  Chief Complaint: Bilateral knee pain  HPI: Joe Green is a 66 y.o. male who presents for evaluation of bilateral knee pain. It has been present for greater than 3 months and has been worsening. He has failed conservative measures. Pain is rated as moderate.  Past Medical History:  Diagnosis Date  . CAD (coronary artery disease)    a. s/p stent to RCA 2005; b. LHC 2006 after Nuc with inf ischemia: patent RCA stent, D1 80%, LAD 60% => med Rx;  c. LHC 04/18/11: Proximal LAD 60% (unchanged from previous catheterization), distal LAD 65% (small caliber), Dx 60%, proximal CFX 20%, mid CFX 50%, OM1 30%, proximal RCA 20%, mid RCA stent patent, EF 65%.=> med Rx  . GERD (gastroesophageal reflux disease)   . HLD (hyperlipidemia)   . Hypertension   . Obesity   . OSA (obstructive sleep apnea)    has a cpap-does not use-has a special mouthpiece he wears at night  . Type II or unspecified type diabetes mellitus without mention of complication, not stated as uncontrolled   . Vitamin D deficiency   . Wears glasses    Past Surgical History:  Procedure Laterality Date  . CARDIAC CATHETERIZATION  2005   stent  . COLONOSCOPY  11/2011   Due 5 years Dr. Fuller Plan  . heart stent  2005   RCA  . POLYPECTOMY     Social History   Socioeconomic History  . Marital status: Married    Spouse name: None  . Number of children: None  . Years of education: None  . Highest education level: None  Social Needs  . Financial resource strain: None  . Food insecurity - worry: None  . Food insecurity - inability: None  . Transportation needs - medical: None  . Transportation needs - non-medical: None  Occupational History  . None  Tobacco Use  . Smoking status: Current Every Day Smoker    Packs/day: 1.00    Years: 40.00    Pack years: 40.00    Types: Cigarettes  . Smokeless tobacco: Never Used  . Tobacco comment: sent in chantix  Substance and Sexual Activity  . Alcohol use: Yes   Comment: 3-6 per night  . Drug use: No  . Sexual activity: Not Currently  Other Topics Concern  . None  Social History Narrative  . None   Family History  Problem Relation Age of Onset  . Prostate cancer Father   . Stroke Father   . Hypertension Father    No Known Allergies Prior to Admission medications   Medication Sig Start Date End Date Taking? Authorizing Provider  aspirin EC 81 MG tablet Take 1 tablet (81 mg total) by mouth daily. 11/17/16  Yes Eileen Stanford, PA-C  benzonatate (TESSALON PERLES) 100 MG capsule Take 1 capsule (100 mg total) 3 (three) times daily as needed by mouth for cough (Max: 636m per day). 06/22/17  Yes CVicie Mutters PA-C  Cholecalciferol (VITAMIN D Green) Take 5,000 mg by mouth daily.   Yes [provider]  dapagliflozin propanediol (FARXIGA) 10 MG TABS tablet Take 10 mg by mouth daily. 03/02/17  Yes CVicie Mutters PA-C  doxazosin (CARDURA) 8 MG tablet TAKE ONE TABLET BY MOUTH ONCE DAILY 12/27/16  Yes [name] MD  fluticasone furoate-vilanterol (BREO ELLIPTA) 100-25 MCG/INH AEPB Inhale 1 puff into the lungs daily. Rinse mouth with water after each use 06/30/16  Yes CVicie Mutters PA-C  metFORMIN (GLUCOPHAGE) 1000 MG tablet Take 1 tablet (  1,000 mg total) by mouth 2 (two) times daily. 11/21/16  Yes Vicie Mutters, PA-C  ramipril (ALTACE) 5 MG capsule TAKE 1 CAPSULE BY MOUTH ONCE DAILY 05/13/17  Yes [name], MD  rosuvastatin (CRESTOR) 20 MG tablet Take 1 tablet (20 mg total) by mouth daily. 03/05/17  Yes Vicie Mutters, PA-C  blood glucose meter kit and supplies Dispense based on patient and insurance preference. Use up to four times daily as directed. (FOR ICD-9 250.00, 250.01). 06/25/17   Vicie Mutters, PA-C  glucose blood test strip Test blood sugars once daily.Dx: E11.9 03/02/17   Vicie Mutters, PA-C     Positive ROS: None  All other systems have been reviewed and were otherwise negative with the exception of those  mentioned in the HPI and as above.  Physical Exam: Vitals:   06/25/17 1120  BP: (!) 149/78  Pulse: 88  Resp: 16  Temp: 98.1 F (36.7 C)  SpO2: 98%    General: Alert, no acute distress Cardiovascular: No pedal edema Respiratory: No cyanosis, no use of accessory musculature GI: No organomegaly, abdomen is soft and non-tender Skin: No lesions in the area of chief complaint Neurologic: Sensation intact distally Psychiatric: Patient is competent for consent with normal mood and affect Lymphatic: No axillary or cervical lymphadenopathy  MUSCULOSKELETAL: Bilateral knee surgery palpation over the medial joint line. Positive McMurray bilaterally. No instability bilaterally. Trace effusion bilaterally.  Assessment/Plan: BILATERAL MEDIAL MENISCUS TEARS Plan for Procedure(s): ARTHROSCOPY KNEE  The risks benefits and alternatives were discussed with the patient including but not limited to the risks of nonoperative treatment, versus surgical intervention including infection, bleeding, nerve injury, malunion, nonunion, hardware prominence, hardware failure, need for hardware removal, blood clots, cardiopulmonary complications, morbidity, mortality, among others, and they were willing to proceed.  Predicted outcome is good, although there will be at least a six to nine month expected recovery.  Alta Corning, MD 06/25/2017 12:41 PM

## 2017-06-25 NOTE — Anesthesia Postprocedure Evaluation (Signed)
Anesthesia Post Note  Patient: VITALY WANAT  Procedure(s) Performed: right knee arthroscopy with medial meniscectomy, chondroplasty, Left knee arthroscopy with medial menisectomy removal of plica and chondroplasty (Bilateral Knee)     Patient location during evaluation: PACU Anesthesia Type: General Level of consciousness: awake and alert Pain management: pain level controlled Vital Signs Assessment: post-procedure vital signs reviewed and stable Respiratory status: spontaneous breathing, nonlabored ventilation, respiratory function stable and patient connected to nasal cannula oxygen Cardiovascular status: blood pressure returned to baseline and stable Postop Assessment: no apparent nausea or vomiting Anesthetic complications: no    Last Vitals:  Vitals:   06/25/17 1415 06/25/17 1430  BP: (!) 148/84 140/84  Pulse: 91 92  Resp: 16 18  Temp: 37 C   SpO2: 98% 93%    Last Pain:  Vitals:   06/25/17 1120  TempSrc: Oral    LLE Motor Response: Purposeful movement (06/25/17 1449) LLE Sensation: No numbness;No pain;No tingling (06/25/17 1449) RLE Motor Response: Purposeful movement (06/25/17 1449) RLE Sensation: No numbness;No pain;No tingling (06/25/17 1449)      Aniel Hubble S

## 2017-06-25 NOTE — Brief Op Note (Signed)
06/25/2017  3:48 PM  PATIENT:  Joe Green  67 y.o. male  PRE-OPERATIVE DIAGNOSIS:  BILATERAL MEDIAL MENISCUS TEARS  POST-OPERATIVE DIAGNOSIS:  BILATERAL MEDIAL MENISCUS TEARS, Left Knee Plica and Bilateral degenerative joint disease  PROCEDURE:  Procedure(s): right knee arthroscopy with medial meniscectomy, chondroplasty, Left knee arthroscopy with medial menisectomy removal of plica and chondroplasty (Bilateral)  SURGEON:  Surgeon(s) and Role:    Dorna Leitz, MD - Primary  PHYSICIAN ASSISTANT:   ASSISTANTS: bethune   ANESTHESIA:   general  EBL:  minimal  BLOOD ADMINISTERED:none  DRAINS: none   LOCAL MEDICATIONS USED:  MARCAINE     SPECIMEN:  No Specimen  DISPOSITION OF SPECIMEN:  N/A  COUNTS:  YES  TOURNIQUET:  * No tourniquets in log *  DICTATION: .Other Dictation: Dictation Number [id number]  PLAN OF CARE: Discharge to home after PACU  PATIENT DISPOSITION:  PACU - hemodynamically stable.   Delay start of Pharmacological VTE agent (>24hrs) due to surgical blood loss or risk of bleeding: no

## 2017-06-25 NOTE — Anesthesia Preprocedure Evaluation (Signed)
Anesthesia Evaluation  Patient identified by MRN, date of birth, ID band Patient awake    Reviewed: Allergy & Precautions, NPO status , Patient's Chart, lab work & pertinent test results  Airway Mallampati: II  TM Distance: >3 FB Neck ROM: Full    Dental no notable dental hx.    Pulmonary sleep apnea and Continuous Positive Airway Pressure Ventilation , COPD, Current Smoker,    Pulmonary exam normal breath sounds clear to auscultation       Cardiovascular hypertension, + CAD and + Cardiac Stents  Normal cardiovascular exam Rhythm:Regular Rate:Normal     Neuro/Psych negative neurological ROS  negative psych ROS   GI/Hepatic negative GI ROS, Neg liver ROS,   Endo/Other  negative endocrine ROSdiabetes  Renal/GU negative Renal ROS  negative genitourinary   Musculoskeletal negative musculoskeletal ROS (+)   Abdominal   Peds negative pediatric ROS (+)  Hematology negative hematology ROS (+)   Anesthesia Other Findings   Reproductive/Obstetrics negative OB ROS                             Anesthesia Physical Anesthesia Plan  ASA: III  Anesthesia Plan: General   Post-op Pain Management:    Induction: Intravenous  PONV Risk Score and Plan: 1 and Ondansetron, Dexamethasone and Treatment may vary due to age or medical condition  Airway Management Planned: LMA  Additional Equipment:   Intra-op Plan:   Post-operative Plan: Extubation in OR  Informed Consent: I have reviewed the patients History and Physical, chart, labs and discussed the procedure including the risks, benefits and alternatives for the proposed anesthesia with the patient or authorized representative who has indicated his/her understanding and acceptance.   Dental advisory given  Plan Discussed with: CRNA and Surgeon  Anesthesia Plan Comments:         Anesthesia Quick Evaluation

## 2017-06-26 ENCOUNTER — Encounter (HOSPITAL_BASED_OUTPATIENT_CLINIC_OR_DEPARTMENT_OTHER): Payer: Self-pay | Admitting: Orthopedic Surgery

## 2017-06-26 NOTE — Op Note (Signed)
NAME:  Joe Green, Joe Green                  ACCOUNT NO.:  MEDICAL RECORD NO.:  [id number]  LOCATION:                                 FACILITY:  PHYSICIAN:  Alta Corning, M.D.        DATE OF BIRTH:  DATE OF PROCEDURE:  06/25/2017 DATE OF DISCHARGE:                              OPERATIVE REPORT   PREOPERATIVE DIAGNOSES:  Medial meniscal tear on right and medial meniscal tear on left.  POSTOPERATIVE DIAGNOSES: 1. Medial meniscal tear, right, bucket handle style. 2. Chondromalacia medial femoral condyle, right; and patellofemoral     joint, right. 3. Medial meniscal tear, left knee, with meniscal flap. 4. Chondromalacia, medial femoral condyle, left. 5. Medial shelf plica, left.  BRIEF HISTORY:  Joe Green is a 66 year old male with history of having had a right knee MRI.  MRI showed a medial meniscal tear with displaced flap.  He was having bilateral knee pain and we talked about the possibility of left knee MRI.  We felt that the symptoms were so similar that he likely was going to have a large medial meniscal tear on the left side.  After failure of conservative care, he was taken to the operating room for bilateral knee arthroscopy.  DESCRIPTION OF PROCEDURE:  The patient was brought to the operative room, and after adequate anesthesia was obtained with general anesthetic, the patient was placed supine on the operating room table. Both legs were prepped and draped in the usual sterile fashion. Following this, routine arthroscopic examination of the right knee was performed, which showed that he had a bucket-handle medial meniscal tear with a displaced flap.  We released it from the back, pulled out the piece in total and then manicured down the remaining portion of the meniscal rim.  Once that was completed, we did a chondroplasty of the medial femoral condyle.  We went to the ACL which was normal.  Lateral side was normal.  We went back up to the patellofemoral joint.   There was some significant chondromalacia in the trochlea and undersurface of the patella.  These were debrided as well.  At this point, the knee was copiously and thoroughly irrigated with irrigation and suctioned dry and the attention was then turned towards the left knee.  Routine arthroscopic examination of the left knee revealed that there was chondromalacia of the patellofemoral joint, not quite as bad as in the right knee.  There was a large draping medial plica in this left knee, which was debrided.  We got in the medial compartment.  There was a complex posterior horn tear with a flap that was coming anteriorly.  We debrided this flap as well as around posteriorly and once this was done, we went to the ACL which was normal.  We went to the lateral side which was normal.  We went back to the medial side and manicured down the remaining portion of the rim.  At this point, the knee was copiously and thoroughly irrigated and suctioned dry.  The portals were then injected with 0.25% Marcaine and then both knees were instilled with 20 mL of 0.25% Marcaine.  At this point, the sterile  compressive dressings were applied on both knees and the patient was taken to recovery, was noted be in satisfactory condition.  Estimated blood loss for the procedure was minimal.     Alta Corning, M.D.     Joe Green  D:  06/25/2017  T:  06/25/2017  Job:  [id number]

## 2017-07-03 ENCOUNTER — Encounter (HOSPITAL_BASED_OUTPATIENT_CLINIC_OR_DEPARTMENT_OTHER): Payer: Self-pay | Admitting: Orthopedic Surgery

## 2017-07-03 DIAGNOSIS — M25561 Pain in right knee: Secondary | ICD-10-CM | POA: Diagnosis not present

## 2017-07-20 ENCOUNTER — Other Ambulatory Visit: Payer: Self-pay | Admitting: Internal Medicine

## 2017-07-20 ENCOUNTER — Other Ambulatory Visit: Payer: Self-pay | Admitting: Physician Assistant

## 2017-07-20 DIAGNOSIS — N320 Bladder-neck obstruction: Secondary | ICD-10-CM

## 2017-07-26 DIAGNOSIS — Z9889 Other specified postprocedural states: Secondary | ICD-10-CM | POA: Diagnosis not present

## 2017-08-29 ENCOUNTER — Other Ambulatory Visit: Payer: Self-pay | Admitting: Physician Assistant

## 2017-08-29 DIAGNOSIS — E119 Type 2 diabetes mellitus without complications: Secondary | ICD-10-CM

## 2017-09-15 ENCOUNTER — Other Ambulatory Visit: Payer: Self-pay | Admitting: Physician Assistant

## 2017-09-15 DIAGNOSIS — E119 Type 2 diabetes mellitus without complications: Secondary | ICD-10-CM

## 2017-09-27 NOTE — Progress Notes (Signed)
Patient ID: Joe Green, male   DOB: [birth year], 67 y.o.   MRN: [id number]  Assessment and Plan:    Essential hypertension - continue medications, DASH diet, exercise and monitor at home. Call if greater than 130/80.  -     CBC with Differential/Platelet -     BASIC METABOLIC PANEL WITH GFR -     Hepatic function panel -     TSH  Chronic obstructive pulmonary disease, unspecified COPD type ([place]) Advised to stop smoking, will get CXR, continue meds.   OSA and COPD overlap syndrome (HCC) Continue CPAP  Type 2 diabetes mellitus without complication, without long-term current use of insulin (HCC) -     Hemoglobin A1c Discussed general issues about diabetes pathophysiology and management., Educational material distributed., Suggested low cholesterol diet., Encouraged aerobic exercise., Discussed foot care., Reminded to get yearly retinal exam.  Mixed hyperlipidemia -continue medications, check lipids, decrease fatty foods, increase activity. -     Lipid panel  Smokers' cough (HCC) -     benzonatate (TESSALON PERLES) 100 MG capsule; Take 1 capsule (100 mg total) 3 (three) times daily as needed by mouth for cough (Max: 618m per day). Advised to quit smoking   Continue diet and meds as discussed. Further disposition pending results of labs. Discussed med's effects and SE's.   Future Appointments  Date Time Provider [place] 11/21/2017  9:00 AM CVicie Mutters PA-C [id number] None    HPI 67y.o. male  presents for 3 month follow up with hypertension, hyperlipidemia, diabetes and vitamin D deficiency.  He has had left shoulder pain x several months, no injury, nothing worse or better. No SOB, no CP, no worse with exertion, worse with certain movements.    His blood pressure has been controlled at home, today their BP is BP: 126/80.He does workout. He denies chest pain, shortness of breath, dizziness. He is a smoker, he would like to quit.  Had normal stress test 2018,  carotid [id number].    He is on cholesterol medication and denies myalgias. His cholesterol is at goal. The cholesterol was:  03/02/2017: LDL Cholesterol 115 06/22/2017: Cholesterol 131; HDL 49; Triglycerides 58   He has been working on diet and exercise for diabetes without complications, he is on bASA, he is on ACE/ARB, and denies  foot ulcerations, hyperglycemia, hypoglycemia , increased appetite, nausea, paresthesia of the feet, polydipsia, polyuria, visual disturbances, vomiting and weight loss. He is on MF and farxiga,  he checks his sugars 3 x a week, sugars running 110-140s in AM. Last A1C was: 06/22/2017: Hgb A1c MFr Bld 6.9   Patient is on Vitamin D supplement. 11/21/2016: Vit D, 25-Hydroxy 68   BMI is Body mass index is 28.41 kg/m., he is working on diet and exercise. Wt Readings from Last 3 Encounters:  09/28/17 198 lb (89.8 kg)  06/25/17 191 lb (86.6 kg)  06/22/17 192 lb (87.1 kg)    Current Medications:  Current Outpatient Medications on File Prior to Visit  Medication Sig Dispense Refill  . aspirin EC 81 MG tablet Take 1 tablet (81 mg total) by mouth daily. 90 tablet 3  . benzonatate (TESSALON PERLES) 100 MG capsule Take 1 capsule (100 mg total) 3 (three) times daily as needed by mouth for cough (Max: 6054mper day). 60 capsule 2  . blood glucose meter kit and supplies Dispense based on patient and insurance preference. Use up to four times daily as directed. (FOR ICD-9 250.00, 250.01). 1 each 0  .  BREO ELLIPTA 100-25 MCG/INH AEPB INHALE ONE PUFF BY MOUTH INTO THE LUNGS ONCE DAILY. RINSE MOUTH WITH WATER AFTER EACH USE 60 each 3  . Cholecalciferol (VITAMIN D PO) Take 10,000 mg by mouth daily.     Marland Kitchen doxazosin (CARDURA) 8 MG tablet TAKE 1 TABLET BY MOUTH ONCE DAILY 90 tablet 1  . FARXIGA 10 MG TABS tablet TAKE 1 TABLET BY MOUTH ONCE DAILY 90 tablet 1  . glucose blood test strip Test blood sugars once daily.Dx: E11.9 100 each 2  . metFORMIN (GLUCOPHAGE) 1000 MG tablet TAKE 1 TABLET  BY MOUTH TWICE DAILY 180 tablet 1  . ramipril (ALTACE) 5 MG capsule TAKE 1 CAPSULE BY MOUTH ONCE DAILY 90 capsule 1  . rosuvastatin (CRESTOR) 20 MG tablet Take 1 tablet (20 mg total) by mouth daily. 90 tablet 1  . fluticasone furoate-vilanterol (BREO ELLIPTA) 100-25 MCG/INH AEPB Inhale 1 puff into the lungs daily. Rinse mouth with water after each use 1 each 3  . oxyCODONE-acetaminophen (PERCOCET/ROXICET) 5-325 MG tablet Take 1-2 tablets every 6 (six) hours as needed by mouth for severe pain. 40 tablet 0   No current facility-administered medications on file prior to visit.    Medical History:  Past Medical History:  Diagnosis Date  . CAD (coronary artery disease)    a. s/p stent to RCA 2005; b. LHC 2006 after Nuc with inf ischemia: patent RCA stent, D1 80%, LAD 60% => med Rx;  c. LHC 04/18/11: Proximal LAD 60% (unchanged from previous catheterization), distal LAD 65% (small caliber), Dx 60%, proximal CFX 20%, mid CFX 50%, OM1 30%, proximal RCA 20%, mid RCA stent patent, EF 65%.=> med Rx  . GERD (gastroesophageal reflux disease)   . HLD (hyperlipidemia)   . Hypertension   . Obesity   . OSA (obstructive sleep apnea)    has a cpap-does not use-has a special mouthpiece he wears at night  . Type II or unspecified type diabetes mellitus without mention of complication, not stated as uncontrolled   . Vitamin D deficiency   . Wears glasses    Allergies: No Known Allergies   Review of Systems:  Review of Systems  Constitutional: Negative for chills, fever and malaise/fatigue.  HENT: Negative for congestion, ear pain and sore throat.   Respiratory: Negative for cough, shortness of breath and wheezing.   Cardiovascular: Negative for chest pain, palpitations and leg swelling.  Gastrointestinal: Negative for blood in stool, constipation, diarrhea, heartburn and melena.  Genitourinary: Negative.   Musculoskeletal: Positive for joint pain (bilateral knees).  Neurological: Negative for dizziness,  sensory change, loss of consciousness and headaches.  Psychiatric/Behavioral: Negative for depression. The patient is not nervous/anxious and does not have insomnia.     Family history- Review and unchanged  Social history- Review and unchanged  Physical Exam: BP 126/80   Pulse 76   Temp 97.7 F (36.5 C)   Ht 5' 10"  (1.778 m)   Wt 198 lb (89.8 kg)   SpO2 97%   BMI 28.41 kg/m  Wt Readings from Last 3 Encounters:  09/28/17 198 lb (89.8 kg)  06/25/17 191 lb (86.6 kg)  06/22/17 192 lb (87.1 kg)   General Appearance: Well nourished well developed, non-toxic appearing, in no apparent distress. Eyes: PERRLA, EOMs, conjunctiva no swelling or erythema ENT/Mouth: Ear canals clear with no erythema, swelling, or discharge.  TMs normal bilaterally, oropharynx clear, moist, with no exudate.   Neck: Supple, thyroid normal, no JVD, no cervical adenopathy.  Respiratory: Respiratory effort normal,  breath sounds clear A&P, no wheeze, rhonchi or rales noted.  No retractions, no accessory muscle usage Cardio: RRR with no MRGs. No noted edema.  Abdomen: Soft, + BS.  Non tender, no guarding, rebound, hernias, masses. Musculoskeletal: Full ROM, 5/5 strength, antalgic gait, walking with cane.  Skin: Warm, dry without rashes, lesions, ecchymosis.  Neuro: Awake and oriented X 3, Cranial nerves intact. No cerebellar symptoms.  Psych: normal affect, Insight and Judgment appropriate.    Vicie Mutters, PA-C 10:22 AM [facility]

## 2017-09-28 ENCOUNTER — Ambulatory Visit: Payer: BLUE CROSS/BLUE SHIELD | Admitting: Physician Assistant

## 2017-09-28 ENCOUNTER — Encounter: Payer: Self-pay | Admitting: Physician Assistant

## 2017-09-28 VITALS — BP 126/80 | HR 76 | Temp 97.7°F | Ht 70.0 in | Wt 198.0 lb

## 2017-09-28 DIAGNOSIS — J449 Chronic obstructive pulmonary disease, unspecified: Secondary | ICD-10-CM

## 2017-09-28 DIAGNOSIS — Z79899 Other long term (current) drug therapy: Secondary | ICD-10-CM | POA: Diagnosis not present

## 2017-09-28 DIAGNOSIS — I10 Essential (primary) hypertension: Secondary | ICD-10-CM

## 2017-09-28 DIAGNOSIS — E782 Mixed hyperlipidemia: Secondary | ICD-10-CM

## 2017-09-28 DIAGNOSIS — IMO0001 Reserved for inherently not codable concepts without codable children: Secondary | ICD-10-CM

## 2017-09-28 DIAGNOSIS — I25119 Atherosclerotic heart disease of native coronary artery with unspecified angina pectoris: Secondary | ICD-10-CM

## 2017-09-28 DIAGNOSIS — F17200 Nicotine dependence, unspecified, uncomplicated: Secondary | ICD-10-CM

## 2017-09-28 DIAGNOSIS — J410 Simple chronic bronchitis: Secondary | ICD-10-CM | POA: Diagnosis not present

## 2017-09-28 DIAGNOSIS — G4733 Obstructive sleep apnea (adult) (pediatric): Secondary | ICD-10-CM | POA: Diagnosis not present

## 2017-09-28 DIAGNOSIS — E119 Type 2 diabetes mellitus without complications: Secondary | ICD-10-CM

## 2017-09-28 MED ORDER — FLUTICASONE FUROATE-VILANTEROL 100-25 MCG/INH IN AEPB: INHALATION_SPRAY | RESPIRATORY_TRACT | 3 refills | Status: DC

## 2017-09-28 MED ORDER — BENZONATATE 100 MG PO CAPS: 100.0000 mg | ORAL_CAPSULE | Freq: Three times a day (TID) | ORAL | 2 refills | Status: DC | PRN

## 2017-09-28 MED ORDER — DAPAGLIFLOZIN PROPANEDIOL 10 MG PO TABS: 10.0000 mg | ORAL_TABLET | Freq: Every day | ORAL | 1 refills | Status: DC

## 2017-09-28 NOTE — Patient Instructions (Signed)
Use a dropper or use a cap to put peroxide, olive oil,mineral oil or canola oil in the effected ear- 2-3 times a week. Let it soak for 20-30 min then you can take a shower or use a baby bulb with warm water to wash out the ear wax.  Do not use Qtips    Rotator Cuff Tendinitis Rotator cuff tendinitis is inflammation of the tough, cord-like bands that connect muscle to bone (tendons) in the rotator cuff. The rotator cuff includes all of the muscles and tendons that connect the arm to the shoulder. The rotator cuff holds the head of the upper arm bone (humerus) in the cup (fossa) of the shoulder blade (scapula). This condition can lead to a long-lasting (chronic) tear. The tear may be partial or complete. What are the causes? This condition is usually caused by overusing the rotator cuff. What increases the risk? This condition is more likely to develop in athletes and workers who frequently use their shoulder or reach over their heads. This can include activities such as:  Tennis.  Baseball or softball.  Swimming.  Construction work.  Painting.  What are the signs or symptoms? Symptoms of this condition include:  Pain spreading (radiating) from the shoulder to the upper arm.  Swelling and tenderness in front of the shoulder.  Pain when reaching, pulling, or lifting the arm above the head.  Pain when lowering the arm from above the head.  Minor pain in the shoulder when resting.  Increased pain in the shoulder at night.  Difficulty placing the arm behind the back.  How is this diagnosed? This condition is diagnosed with a medical history and physical exam. Tests may also be done, including:  X-rays.  MRI.  Ultrasounds.  CT or MR arthrogram. During this test, a contrast material is injected and then images are taken.  How is this treated? Treatment for this condition depends on the severity of the condition. In less severe cases, treatment may include:  Rest. This  may be done with a sling that holds the shoulder still (immobilization). Your health care provider may also recommend avoiding activities that involve lifting your arm over your head.  Icing the shoulder.  Anti-inflammatory medicines, such as aspirin or ibuprofen.  In more severe cases, treatment may include:  Physical therapy.  Steroid injections.  Surgery.  Follow these instructions at home: If you have a sling:  Wear the sling as told by your health care provider. Remove it only as told by your health care provider.  Loosen the sling if your fingers tingle, become numb, or turn cold and blue.  Keep the sling clean.  If the sling is not waterproof, do not let it get wet. Remove it, if allowed, or cover it with a watertight covering when you take a bath or shower. Managing pain, stiffness, and swelling  If directed, put ice on the injured area. ? If you have a removable sling, remove it as told by your health care provider. ? Put ice in a plastic bag. ? Place a towel between your skin and the bag. ? Leave the ice on for 20 minutes, 2-3 times a day.  Move your fingers often to avoid stiffness and to lessen swelling.  Raise (elevate) the injured area above the level of your heart while you are lying down.  Find a comfortable sleeping position or sleep on a recliner, if available. Driving  Do not drive or use heavy machinery while taking prescription pain medicine.  Ask your health care provider when it is safe to drive if you have a sling on your arm. Activity  Rest your shoulder as told by your health care provider.  Return to your normal activities as told by your health care provider. Ask your health care provider what activities are safe for you.  Do any exercises or stretches as told by your health care provider.  If you do repetitive overhead tasks, take small breaks in between and include stretching exercises as told by your health care provider. General  instructions  Do not use any products that contain nicotine or tobacco, such as cigarettes and e-cigarettes. These can delay healing. If you need help quitting, ask your health care provider.  Take over-the-counter and prescription medicines only as told by your health care provider.  Keep all follow-up visits as told by your health care provider. This is important. Contact a health care provider if:  Your pain gets worse.  You have new pain in your arm, hands, or fingers.  Your pain is not relieved with medicine or does not get better after 6 weeks of treatment.  You have cracking sensations when moving your shoulder in certain directions.  You hear a snapping sound after using your shoulder, followed by severe pain and weakness. Get help right away if:  Your arm, hand, or fingers are numb or tingling.  Your arm, hand, or fingers are swollen or painful or they turn white or blue. Summary  Rotator cuff tendinitis is inflammation of the tough, cord-like bands that connect muscle to bone (tendons) in the rotator cuff.  This condition is usually caused by overusing the rotator cuff, which includes all of the muscles and tendons that connect the arm to the shoulder.  This condition is more likely to develop in athletes and workers who frequently use their shoulder or reach over their heads.  Treatment generally includes rest, anti-inflammatory medicines, and icing. In some cases, physical therapy and steroid injections may be needed. In severe cases, surgery may be needed. This information is not intended to replace advice given to you by your health care provider. Make sure you discuss any questions you have with your health care provider. Document Released: 10/21/2003 Document Revised: 07/17/2016 Document Reviewed: 07/17/2016 Elsevier Interactive Patient Education  2017 [place].    Shoulder Impingement Syndrome Rehab Ask your health care provider which exercises are safe  for you. Do exercises exactly as told by your health care provider and adjust them as directed. It is normal to feel mild stretching, pulling, tightness, or discomfort as you do these exercises, but you should stop right away if you feel sudden pain or your pain gets worse.Do not begin these exercises until told by your health care provider. Stretching and range of motion exercise This exercise warms up your muscles and joints and improves the movement and flexibility of your shoulder. This exercise also helps to relieve pain and stiffness. Exercise A: Passive horizontal adduction  1. Sit or stand and pull your left / right elbow across your chest, toward your other shoulder. Stop when you feel a gentle stretch in the back of your shoulder and upper arm. ? Keep your arm at shoulder height. ? Keep your arm as close to your body as you comfortably can. 2. Hold for __________ seconds. 3. Slowly return to the starting position. Repeat __________ times. Complete this exercise __________ times a day. Strengthening exercises These exercises build strength and endurance in your shoulder. Endurance is the  ability to use your muscles for a long time, even after they get tired. Exercise B: External rotation, isometric 1. Stand or sit in a doorway, facing the door frame. 2. Bend your left / right elbow and place the back of your wrist against the door frame. Only your wrist should be touching the frame. Keep your upper arm at your side. 3. Gently press your wrist against the door frame, as if you are trying to push your arm away from your abdomen. ? Avoid shrugging your shoulder while you press your hand against the door frame. Keep your shoulder blade tucked down toward the middle of your back. 4. Hold for __________ seconds. 5. Slowly release the tension, and relax your muscles completely before you do the exercise again. Repeat __________ times. Complete this exercise __________ times a day. Exercise C:  Internal rotation, isometric  1. Stand or sit in a doorway, facing the door frame. 2. Bend your left / right elbow and place the inside of your wrist against the door frame. Only your wrist should be touching the frame. Keep your upper arm at your side. 3. Gently press your wrist against the door frame, as if you are trying to push your arm toward your abdomen. ? Avoid shrugging your shoulder while you press your hand against the door frame. Keep your shoulder blade tucked down toward the middle of your back. 4. Hold for __________ seconds. 5. Slowly release the tension, and relax your muscles completely before you do the exercise again. Repeat __________ times. Complete this exercise __________ times a day. Exercise D: Scapular protraction, supine  1. Lie on your back on a firm surface. Hold a __________ weight in your left / right hand. 2. Raise your left / right arm straight into the air so your hand is directly above your shoulder joint. 3. Push the weight into the air so your shoulder lifts off of the surface that you are lying on. Do not move your head, neck, or back. 4. Hold for __________ seconds. 5. Slowly return to the starting position. Let your muscles relax completely before you repeat this exercise. Repeat __________ times. Complete this exercise __________ times a day. Exercise E: Scapular retraction  1. Sit in a stable chair without armrests, or stand. 2. Secure an exercise band to a stable object in front of you so the band is at shoulder height. 3. Hold one end of the exercise band in each hand. Your palms should face down. 4. Squeeze your shoulder blades together and move your elbows slightly behind you. Do not shrug your shoulders while you do this. 5. Hold for __________ seconds. 6. Slowly return to the starting position. Repeat __________ times. Complete this exercise __________ times a day. Exercise F: Shoulder extension  1. Sit in a stable chair without armrests,  or stand. 2. Secure an exercise band to a stable object in front of you where the band is above shoulder height. 3. Hold one end of the exercise band in each hand. 4. Straighten your elbows and lift your hands up to shoulder height. 5. Squeeze your shoulder blades together and pull your hands down to the sides of your thighs. Stop when your hands are straight down by your sides. Do not let your hands go behind your body. 6. Hold for __________ seconds. 7. Slowly return to the starting position. Repeat __________ times. Complete this exercise __________ times a day. This information is not intended to replace advice given to you by  your health care provider. Make sure you discuss any questions you have with your health care provider. Document Released: 07/31/2005 Document Revised: 04/06/2016 Document Reviewed: 07/03/2015 Elsevier Interactive Patient Education  [employer].

## 2017-09-29 LAB — CBC WITH DIFFERENTIAL/PLATELET
BASOS ABS: 53 {cells}/uL (ref 0–200)
Basophils Relative: 0.7 %
EOS PCT: 4.1 %
Eosinophils Absolute: 308 cells/uL (ref 15–500)
HEMATOCRIT: 45.9 % (ref 38.5–50.0)
Hemoglobin: 15.5 g/dL (ref 13.2–17.1)
Lymphs Abs: 1763 cells/uL (ref 850–3900)
MCH: 31 pg (ref 27.0–33.0)
MCHC: 33.8 g/dL (ref 32.0–36.0)
MCV: 91.8 fL (ref 80.0–100.0)
MONOS PCT: 8.8 %
MPV: 12.2 fL (ref 7.5–12.5)
NEUTROS PCT: 62.9 %
Neutro Abs: 4718 cells/uL (ref 1500–7800)
Platelets: 135 10*3/uL — ABNORMAL LOW (ref 140–400)
RBC: 5 10*6/uL (ref 4.20–5.80)
RDW: 12 % (ref 11.0–15.0)
Total Lymphocyte: 23.5 %
WBC mixed population: 660 cells/uL (ref 200–950)
WBC: 7.5 10*3/uL (ref 3.8–10.8)

## 2017-09-29 LAB — HEPATIC FUNCTION PANEL
AG Ratio: 2.2 (calc) (ref 1.0–2.5)
ALKALINE PHOSPHATASE (APISO): 90 U/L (ref 40–115)
ALT: 19 U/L (ref 9–46)
AST: 12 U/L (ref 10–35)
Albumin: 4.2 g/dL (ref 3.6–5.1)
BILIRUBIN TOTAL: 0.6 mg/dL (ref 0.2–1.2)
Bilirubin, Direct: 0.2 mg/dL (ref 0.0–0.2)
Globulin: 1.9 g/dL (calc) (ref 1.9–3.7)
Indirect Bilirubin: 0.4 mg/dL (calc) (ref 0.2–1.2)
Total Protein: 6.1 g/dL (ref 6.1–8.1)

## 2017-09-29 LAB — BASIC METABOLIC PANEL WITH GFR
BUN/Creatinine Ratio: 15 (calc) (ref 6–22)
BUN: 10 mg/dL (ref 7–25)
CHLORIDE: 103 mmol/L (ref 98–110)
CO2: 27 mmol/L (ref 20–32)
Calcium: 9.1 mg/dL (ref 8.6–10.3)
Creat: 0.67 mg/dL — ABNORMAL LOW (ref 0.70–1.25)
GFR, EST NON AFRICAN AMERICAN: 100 mL/min/{1.73_m2} (ref >=60)
GFR, Est African American: 116 mL/min/{1.73_m2} (ref >=60)
GLUCOSE: 201 mg/dL — AB (ref 65–99)
POTASSIUM: 4.1 mmol/L (ref 3.5–5.3)
SODIUM: 137 mmol/L (ref 135–146)

## 2017-09-29 LAB — HEMOGLOBIN A1C
EAG (MMOL/L): 9.3 (calc)
Hgb A1c MFr Bld: 7.5 % of total Hgb — ABNORMAL HIGH (ref <5.7)
Mean Plasma Glucose: 169 (calc)

## 2017-09-29 LAB — MAGNESIUM: MAGNESIUM: 1.8 mg/dL (ref 1.5–2.5)

## 2017-09-29 LAB — LIPID PANEL
Cholesterol: 110 mg/dL (ref <200)
HDL: 48 mg/dL (ref >40)
LDL Cholesterol (Calc): 45 mg/dL (calc)
NON-HDL CHOLESTEROL (CALC): 62 mg/dL (ref <130)
Total CHOL/HDL Ratio: 2.3 (calc) (ref <5.0)
Triglycerides: 87 mg/dL (ref <150)

## 2017-09-29 LAB — TSH: TSH: 1.15 mIU/L (ref 0.40–4.50)

## 2017-11-21 ENCOUNTER — Encounter: Payer: Self-pay | Admitting: Physician Assistant

## 2017-12-19 ENCOUNTER — Other Ambulatory Visit: Payer: Self-pay | Admitting: Internal Medicine

## 2017-12-30 DIAGNOSIS — I700 Atherosclerosis of aorta: Secondary | ICD-10-CM | POA: Insufficient documentation

## 2017-12-30 DIAGNOSIS — E663 Overweight: Secondary | ICD-10-CM | POA: Insufficient documentation

## 2017-12-30 HISTORY — DX: Atherosclerosis of aorta: I70.0

## 2017-12-30 NOTE — Progress Notes (Signed)
Complete Physical  Assessment and Plan:   Encounter for general adult medical examination with abnormal findings  Essential hypertension Continue medications Monitor blood pressure at home; call if consistently over 130/80 Continue DASH diet.   Reminder to go to the ER if any CP, SOB, nausea, dizziness, severe HA, changes vision/speech, left arm numbness and tingling and jaw pain.  Coronary artery disease involving native heart with angina pectoris, unspecified vessel or lesion type ([place]) Control blood pressure, cholesterol, glucose, increase exercise.  Continue cardio follow up Stop smoking  OSA and COPD overlap syndrome (HCC) Wears mouthpiece, followed by Dr. Toy Cookey  Chronic obstructive pulmonary disease, unspecified COPD type ([place]) Continue inhalers, stop smoking, will get annual screening CT due to hx  Type 2 diabetes mellitus with other circulatory complication, without long-term current use of insulin [facility]) Education: Reviewed 'ABCs' of diabetes management (respective goals in parentheses):  A1C (<7), blood pressure (<130/80), and cholesterol (LDL <70) Eye Exam yearly and Dental Exam every 6 months - referred to ophthalmology Dietary recommendations Physical Activity recommendations Foot exam completed - normal  Benign prostatic hyperplasia with urinary frequency Currently awakening q2 hours at night; on doxazosin only at this time Discussed adding second agent - patient agreeable to add proscar daily after discussion of risks and benefits, with plan to attempt taper off doxazosin in 12 months if he has perceived improvement of urinary symptoms.   Vitamin D deficiency Continue supplementation Check vitamin D level  Smoking Quit last week, doing well.  Strategies for success discussed.   RLS (restless legs syndrome) Has been declining medications, increase daytime activity  Mixed hyperlipidemia Continue medications Continue low cholesterol diet and exercise.   Check lipid panel.   Medication management CBC, CMP/GFR, UA, magnesium  Elevated PSA Check PSA  Overweight (BMI 25.0-29.9) Long discussion about weight loss, diet, and exercise Recommended diet heavy in fruits and veggies and low in animal meats, cheeses, and dairy products, appropriate calorie intake Discussed appropriate weight for height  Follow up at next visit  Aortic atherosclerosis ([place]) Control blood pressure, cholesterol, glucose, increase exercise.     Discussed med's effects and SE's. Screening labs and tests as requested with regular follow-up as recommended. Over 40 minutes of exam, counseling, chart review and critical decision making was performed  Future Appointments  Date Time Provider [place]  01/02/2019  9:00 AM [name], NP [facility] None     HPI Patient presents for a complete physical. has Mixed hyperlipidemia; CAD (coronary artery disease); Hypertension; OSA and COPD overlap syndrome ([place]); Smoking; Vitamin D deficiency; Medication management; Type 2 diabetes mellitus (HCC); RLS (restless legs syndrome); COPD (chronic obstructive pulmonary disease) (HCC); BPH (benign prostatic hyperplasia); Elevated PSA; and Overweight (BMI 25.0-29.9) on their problem list.   BMI is Body mass index is 28.12 kg/m., he has been working on diet and exercise. 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 He denies chest pain, shortness of breath, dizziness.   He is on cholesterol medication and denies myalgias. His cholesterol is at goal. The cholesterol last visit was:   Lab Results  Component Value Date   CHOL 110 09/28/2017   HDL 48 09/28/2017   LDLCALC 45 09/28/2017   TRIG 87 09/28/2017   CHOLHDL 2.3 09/28/2017  He has been  working on diet and exercise for diabetes, he is on bASA, he is on ACE/ARB and denies foot ulcerations, increased appetite, nausea, paresthesia of the feet, polydipsia, polyuria, visual disturbances, vomiting and weight loss. He does check fasting sugars ranging 120-150. Last A1C in the office was:  Lab Results  Component Value Date   HGBA1C 7.5 (H) 09/28/2017   Last GFR: Lab Results  Component Value Date   GFRNONAA 100 09/28/2017    Patient is on Vitamin D supplement.   Lab Results  Component Value Date   VD25OH 68 11/21/2016     Last PSA was: Lab Results  Component Value Date   PSA 2.9 11/21/2016    Current Medications:  Current Outpatient Medications on File Prior to Visit  Medication Sig Dispense Refill  . aspirin EC 81 MG tablet Take 1 tablet (81 mg total) by mouth daily. 90 tablet 3  . benzonatate (TESSALON PERLES) 100 MG capsule Take 1 capsule (100 mg total) by mouth 3 (three) times daily as needed for cough (Max: 654m per day). 60 capsule 2  . blood glucose meter kit and supplies Dispense based on patient and insurance preference. Use up to four times daily as directed. (FOR ICD-9 250.00, 250.01). 1 each 0  . Cholecalciferol (VITAMIN D PO) Take 10,000 mg by mouth daily.     . dapagliflozin propanediol (FARXIGA) 10 MG TABS tablet Take 10 mg by mouth daily. 90 tablet 1  . doxazosin (CARDURA) 8 MG tablet TAKE 1 TABLET BY MOUTH ONCE DAILY 90 tablet 1  . fluticasone furoate-vilanterol (BREO ELLIPTA) 100-25 MCG/INH AEPB Inhale 1 puff into the lungs daily. Rinse mouth with water after each use 1 each 3  . glucose blood test strip Test blood sugars once daily.Dx: E11.9 100 each 2  . metFORMIN (GLUCOPHAGE) 1000 MG tablet TAKE 1 TABLET BY MOUTH TWICE DAILY 180 tablet 1  . ramipril (ALTACE) 5 MG capsule TAKE 1 CAPSULE BY MOUTH ONCE DAILY 90 capsule 1  . rosuvastatin (CRESTOR) 20 MG tablet Take 1 tablet (20 mg total) by mouth daily. 90 tablet 1   No current facility-administered  medications on file prior to visit.    Allergies:  No Known Allergies Health Maintenance:  Immunization History  Administered Date(s) Administered  . Influenza, High Dose Seasonal PF 06/22/2017  . Influenza-Unspecified 06/02/2013, 08/18/2014  . Pneumococcal Conjugate-13 11/21/2016  . Pneumococcal Polysaccharide-23 08/27/2007  . Tdap 08/27/2007  . Zoster 10/20/2013   Tetanus: 2009 DUE Pneumovax: 2009, DUE Prevnar 13: 2018 Flu vaccine: 2018 Zostavax: 2015  DEXA: - Colonoscopy: 2013 DUE - declines at this time, will call insurance EGD: -  Eye Exam:  Last 2015 - referring to ophth Dentist: Several years, has appointment in July  Patient Care Team: [name] MD as PCP - General (Internal Medicine) NJosue Hector MD as Consulting Physician (Cardiology) SLadene Artist MD as Consulting Physician (Gastroenterology)  Medical History:  has Mixed hyperlipidemia; CAD (coronary artery disease); Hypertension; OSA and COPD overlap syndrome ([place]; Smoking; Vitamin D deficiency; Medication management; Type 2 diabetes mellitus (HCC); RLS (restless legs syndrome); COPD (chronic obstructive pulmonary disease) (HCC); BPH (benign prostatic hyperplasia); Elevated PSA; Acute medial meniscal tear, right, initial encounter; Acute medial meniscal tear, left, initial encounter; Chondromalacia, left knee; Chondromalacia, right knee; Overweight (BMI 25.0-29.9); and Aortic atherosclerosis (HCC) on their problem list. Surgical History:  He  has a past surgical history that includes heart stent (2005); Colonoscopy (11/2011); Polypectomy; Cardiac catheterization (2005); Inguinal hernia repair (  Right, 11/16/2014); Insertion of mesh (Right, 11/16/2014); Knee arthroscopy with medial menisectomy (Bilateral, 06/25/2017); and Chondroplasty (06/25/2017). Family History:  His family history includes Cancer in his mother; Hypertension in his father; Kidney cancer in his maternal grandmother; Prostate cancer in his  father. Social History:   reports that he quit smoking 8 days ago. His smoking use included cigarettes. He has a 40.00 pack-year smoking history. He has never used smokeless tobacco. He reports that he drinks alcohol. He reports that he does not use drugs. Review of Systems:  Review of Systems  Constitutional: Negative for malaise/fatigue and weight loss.  HENT: Negative for hearing loss and tinnitus.   Eyes: Negative for blurred vision and double vision.  Respiratory: Negative for cough, sputum production, shortness of breath and wheezing.   Cardiovascular: Negative for chest pain, palpitations, orthopnea, claudication, leg swelling and PND.  Gastrointestinal: Negative for abdominal pain, blood in stool, constipation, diarrhea, heartburn, melena, nausea and vomiting.  Genitourinary: Negative.   Musculoskeletal: Negative for falls, joint pain and myalgias.  Skin: Negative for rash.  Neurological: Negative for dizziness, tingling, sensory change, weakness and headaches.  Endo/Heme/Allergies: Negative for polydipsia.  Psychiatric/Behavioral: Negative.  Negative for depression, memory loss, substance abuse and suicidal ideas. The patient is not nervous/anxious and does not have insomnia.   All other systems reviewed and are negative.   Physical Exam: Estimated body mass index is 28.12 kg/m as calculated from the following:   Height as of this encounter: _0  (1.778 m).   Weight as of this encounter: 196 lb (88.9 kg). BP 128/72   Pulse 93   Temp 97.9 F (36.6 C)   Ht _1  (1.778 m)   Wt 196 lb (88.9 kg)   SpO2 96%   BMI 28.12 kg/m  General Appearance: Well nourished, in no apparent distress.  Eyes: PERRLA, EOMs, conjunctiva no swelling or erythema, normal fundi and vessels.  Sinuses: No Frontal/maxillary tenderness  ENT/Mouth: Ext aud canals clear, normal light reflex with TMs without erythema, bulging. Good dentition. No erythema, swelling, or exudate on post pharynx. Tonsils  not swollen or erythematous. Hearing normal.  Neck: Supple, thyroid normal. No bruits  Respiratory: Respiratory effort normal, BS equal bilaterally without rales, rhonchi, wheezing or stridor.  Cardio: RRR without murmurs, rubs or gallops. Brisk peripheral pulses without edema.  Chest: symmetric, with normal excursions and percussion.  Abdomen: Soft, nontender, no guarding, rebound, hernias, masses, or organomegaly.  Lymphatics: Non tender without lymphadenopathy.  Genitourinary:  Musculoskeletal: Full ROM all peripheral extremities,5/5 strength, and normal gait.  Skin: Warm, dry without rashes, lesions, ecchymosis. Neuro: Cranial nerves intact, reflexes equal bilaterally. Normal muscle tone, no cerebellar symptoms. Sensation intact.  Psych: Awake and oriented X 3, normal affect, Insight and Judgment appropriate.   EKG: RBBB - no significant changes from previous  Izora Ribas 9:23 AM [facility]

## 2018-01-01 ENCOUNTER — Encounter: Payer: Self-pay | Admitting: Adult Health

## 2018-01-01 ENCOUNTER — Ambulatory Visit (INDEPENDENT_AMBULATORY_CARE_PROVIDER_SITE_OTHER): Payer: BLUE CROSS/BLUE SHIELD | Admitting: Adult Health

## 2018-01-01 VITALS — BP 128/72 | HR 93 | Temp 97.9°F | Ht 70.0 in | Wt 196.0 lb

## 2018-01-01 DIAGNOSIS — Z1329 Encounter for screening for other suspected endocrine disorder: Secondary | ICD-10-CM

## 2018-01-01 DIAGNOSIS — Z131 Encounter for screening for diabetes mellitus: Secondary | ICD-10-CM

## 2018-01-01 DIAGNOSIS — R350 Frequency of micturition: Secondary | ICD-10-CM

## 2018-01-01 DIAGNOSIS — I10 Essential (primary) hypertension: Secondary | ICD-10-CM | POA: Diagnosis not present

## 2018-01-01 DIAGNOSIS — Z13220 Encounter for screening for lipoid disorders: Secondary | ICD-10-CM

## 2018-01-01 DIAGNOSIS — G2581 Restless legs syndrome: Secondary | ICD-10-CM

## 2018-01-01 DIAGNOSIS — Z79899 Other long term (current) drug therapy: Secondary | ICD-10-CM

## 2018-01-01 DIAGNOSIS — IMO0001 Reserved for inherently not codable concepts without codable children: Secondary | ICD-10-CM

## 2018-01-01 DIAGNOSIS — R972 Elevated prostate specific antigen [PSA]: Secondary | ICD-10-CM

## 2018-01-01 DIAGNOSIS — F17200 Nicotine dependence, unspecified, uncomplicated: Secondary | ICD-10-CM

## 2018-01-01 DIAGNOSIS — Z125 Encounter for screening for malignant neoplasm of prostate: Secondary | ICD-10-CM | POA: Diagnosis not present

## 2018-01-01 DIAGNOSIS — Z0000 Encounter for general adult medical examination without abnormal findings: Secondary | ICD-10-CM | POA: Diagnosis not present

## 2018-01-01 DIAGNOSIS — N401 Enlarged prostate with lower urinary tract symptoms: Secondary | ICD-10-CM

## 2018-01-01 DIAGNOSIS — E1159 Type 2 diabetes mellitus with other circulatory complications: Secondary | ICD-10-CM

## 2018-01-01 DIAGNOSIS — G4733 Obstructive sleep apnea (adult) (pediatric): Secondary | ICD-10-CM

## 2018-01-01 DIAGNOSIS — E663 Overweight: Secondary | ICD-10-CM

## 2018-01-01 DIAGNOSIS — I700 Atherosclerosis of aorta: Secondary | ICD-10-CM

## 2018-01-01 DIAGNOSIS — E559 Vitamin D deficiency, unspecified: Secondary | ICD-10-CM | POA: Diagnosis not present

## 2018-01-01 DIAGNOSIS — E782 Mixed hyperlipidemia: Secondary | ICD-10-CM

## 2018-01-01 DIAGNOSIS — Z1389 Encounter for screening for other disorder: Secondary | ICD-10-CM | POA: Diagnosis not present

## 2018-01-01 DIAGNOSIS — Z136 Encounter for screening for cardiovascular disorders: Secondary | ICD-10-CM

## 2018-01-01 DIAGNOSIS — Z122 Encounter for screening for malignant neoplasm of respiratory organs: Secondary | ICD-10-CM

## 2018-01-01 DIAGNOSIS — I25119 Atherosclerotic heart disease of native coronary artery with unspecified angina pectoris: Secondary | ICD-10-CM

## 2018-01-01 DIAGNOSIS — J449 Chronic obstructive pulmonary disease, unspecified: Secondary | ICD-10-CM

## 2018-01-01 DIAGNOSIS — Z0001 Encounter for general adult medical examination with abnormal findings: Secondary | ICD-10-CM

## 2018-01-01 DIAGNOSIS — Z23 Encounter for immunization: Secondary | ICD-10-CM

## 2018-01-01 MED ORDER — FINASTERIDE 5 MG PO TABS: 5.0000 mg | ORAL_TABLET | Freq: Every day | ORAL | 1 refills | Status: DC

## 2018-01-01 NOTE — Patient Instructions (Signed)
Aim for 7+ servings of fruits and vegetables daily  80+ fluid ounces of water or unsweet tea for healthy kidneys  Aim to cut back to 1-2 drink of alcohol per day  Limit animal fats in diet for cholesterol and heart health - choose grass fed whenever available  Aim for low stress - take time to unwind and care for your mental health  Aim for 150 min of moderate intensity exercise weekly for heart health, and weights twice weekly for bone health  Aim for 7-9 hours of sleep daily      When it comes to diets, agreement about the perfect plan isn't easy to find, even among the experts. Experts at the [place] developed an idea known as the Healthy Eating Plate. Just imagine a plate divided into logical, healthy portions.  The emphasis is on diet quality:  Load up on vegetables and fruits - one-half of your plate: Aim for color and variety, and remember that potatoes don't count.  Go for whole grains - one-quarter of your plate: Whole wheat, barley, wheat berries, quinoa, oats, brown rice, and foods made with them. If you want pasta, go with whole wheat pasta.  Protein power - one-quarter of your plate: Fish, chicken, beans, and nuts are all healthy, versatile protein sources. Limit red meat.  The diet, however, does go beyond the plate, offering a few other suggestions.  Use healthy plant oils, such as olive, canola, soy, corn, sunflower and peanut. Check the labels, and avoid partially hydrogenated oil, which have unhealthy trans fats.  If you're thirsty, drink water. Coffee and tea are good in moderation, but skip sugary drinks and limit milk and dairy products to one or two daily servings.  The type of carbohydrate in the diet is more important than the amount. Some sources of carbohydrates, such as vegetables, fruits, whole grains, and beans-are healthier than others.  Finally, stay active.    Finasteride (Proscar) tablets What is this  medicine? FINASTERIDE (fi NAS teer ide) is used to treat benign prostatic hyperplasia (BPH) in men. This is a condition that causes you to have an enlarged prostate. This medicine helps to control your symptoms, decrease urinary retention, and reduces your risk of needing surgery. When used in combination with certain other medicines, this drug can slow down the progression of your disease. This medicine may be used for other purposes; ask your health care provider or pharmacist if you have questions. COMMON BRAND NAME(S): Proscar What should I tell my health care provider before I take this medicine? They need to know if you have any of these conditions: -liver disease -an unusual or allergic reaction to finasteride, other medicines, foods, dyes, or preservatives -pregnant or trying to get pregnant -breast-feeding How should I use this medicine? Take this medicine by mouth with a glass of water. Follow the directions on the prescription label. You can take this medicine with or without food. Take your doses at regular intervals. Do not take your medicine more often than directed. Do not stop taking except on the advice of your doctor or health care professional. Talk to your pediatrician regarding the use of this medicine in children. Special care may be needed. Overdosage: If you think you have taken too much of this medicine contact a poison control center or emergency room at once. NOTE: This medicine is only for you. Do not share this medicine with others. What if I miss a dose? If you miss a dose, take  it as soon as you can. If it is almost time for your next dose, take only that dose. Do not take double or extra doses. What may interact with this medicine? -saw palmetto or other dietary supplements This list may not describe all possible interactions. Give your health care provider a list of all the medicines, herbs, non-prescription drugs, or dietary supplements you use. Also tell them if  you smoke, drink alcohol, or use illegal drugs. Some items may interact with your medicine. What should I watch for while using this medicine? Do not donate blood while you are taking this medicine. This will prevent giving this medicine to a pregnant male through a blood transfusion. Ask your doctor or health care professional when it is safe to donate blood after you stop taking this medicine. Women who are pregnant or may get pregnant must not handle broken or crushed finasteride tablets. The active ingredient could harm the unborn baby. If a pregnant woman comes into contact with broken or crushed tablets she should check with her doctor or health care professional. Exposure to whole tablets is not expected to cause harm as long as they are not swallowed. Contact your doctor or health care professional if your symptoms do not start to get better. You may need to take this medicine for 6 to 12 months to get the best results. This medicine can interfere with PSA laboratory tests for prostate cancer. If you are scheduled to have a lab test for prostate cancer, tell your doctor or health care professional that you are taking this medicine. This medicine may increase your risk of getting some cancers, like breast cancer. Talk with your doctor. What side effects may I notice from receiving this medicine? Side effects that you should report to your doctor or health care professional as soon as possible: -any signs of an allergic reaction like rash, itching, hives or swelling of the lips or face -changes in breast like lumps, pain or fluids leaking from the nipple -pain in the testicles Side effects that usually do not require medical attention (report to your doctor or health care professional if they continue or are bothersome): -sexual difficulties like decreased sexual desire or ability to get an erection -small amount of semen released during sex This list may not describe all possible side  effects. Call your doctor for medical advice about side effects. You may report side effects to FDA at 1-800-FDA-1088. Where should I keep my medicine? Keep out of the reach of children. Store at room temperature below 30 degrees C (86 degrees F). Protect from light. Keep container tightly closed. Throw away any unused medicine after the expiration date. NOTE: This sheet is a summary. It may not cover all possible information. If you have questions about this medicine, talk to your doctor, pharmacist, or health care provider.  2018 Elsevier/Gold Standard (2015-03-18 17:24:30)

## 2018-01-02 LAB — COMPLETE METABOLIC PANEL WITH GFR
AG RATIO: 1.7 (calc) (ref 1.0–2.5)
ALBUMIN MSPROF: 4.4 g/dL (ref 3.6–5.1)
ALT: 17 U/L (ref 9–46)
AST: 14 U/L (ref 10–35)
Alkaline phosphatase (APISO): 108 U/L (ref 40–115)
BUN: 15 mg/dL (ref 7–25)
CALCIUM: 9.9 mg/dL (ref 8.6–10.3)
CHLORIDE: 102 mmol/L (ref 98–110)
CO2: 25 mmol/L (ref 20–32)
CREATININE: 0.95 mg/dL (ref 0.70–1.25)
GFR, EST AFRICAN AMERICAN: 96 mL/min/{1.73_m2} (ref >=60)
GFR, EST NON AFRICAN AMERICAN: 83 mL/min/{1.73_m2} (ref >=60)
GLOBULIN: 2.6 g/dL (ref 1.9–3.7)
Glucose, Bld: 211 mg/dL — ABNORMAL HIGH (ref 65–99)
Potassium: 4.5 mmol/L (ref 3.5–5.3)
Sodium: 137 mmol/L (ref 135–146)
Total Bilirubin: 0.7 mg/dL (ref 0.2–1.2)
Total Protein: 7 g/dL (ref 6.1–8.1)

## 2018-01-02 LAB — MICROALBUMIN / CREATININE URINE RATIO
Creatinine, Urine: 43 mg/dL (ref 20–320)
Microalb Creat Ratio: 14 mcg/mg creat (ref <30)
Microalb, Ur: 0.6 mg/dL

## 2018-01-02 LAB — MAGNESIUM: MAGNESIUM: 2.2 mg/dL (ref 1.5–2.5)

## 2018-01-02 LAB — HEMOGLOBIN A1C
Hgb A1c MFr Bld: 7.7 % of total Hgb — ABNORMAL HIGH (ref <5.7)
Mean Plasma Glucose: 174 (calc)
eAG (mmol/L): 9.7 (calc)

## 2018-01-02 LAB — CBC WITH DIFFERENTIAL/PLATELET
BASOS ABS: 50 {cells}/uL (ref 0–200)
Basophils Relative: 0.4 %
EOS PCT: 2.6 %
Eosinophils Absolute: 325 cells/uL (ref 15–500)
HCT: 48 % (ref 38.5–50.0)
Hemoglobin: 16.6 g/dL (ref 13.2–17.1)
LYMPHS ABS: 1888 {cells}/uL (ref 850–3900)
MCH: 31.5 pg (ref 27.0–33.0)
MCHC: 34.6 g/dL (ref 32.0–36.0)
MCV: 91.1 fL (ref 80.0–100.0)
MPV: 12 fL (ref 7.5–12.5)
Monocytes Relative: 7 %
NEUTROS ABS: 9363 {cells}/uL — AB (ref 1500–7800)
NEUTROS PCT: 74.9 %
Platelets: 162 10*3/uL (ref 140–400)
RBC: 5.27 10*6/uL (ref 4.20–5.80)
RDW: 11.5 % (ref 11.0–15.0)
Total Lymphocyte: 15.1 %
WBC mixed population: 875 cells/uL (ref 200–950)
WBC: 12.5 10*3/uL — ABNORMAL HIGH (ref 3.8–10.8)

## 2018-01-02 LAB — URINALYSIS W MICROSCOPIC + REFLEX CULTURE
Bacteria, UA: NONE SEEN /HPF
Bilirubin Urine: NEGATIVE
HGB URINE DIPSTICK: NEGATIVE
Hyaline Cast: NONE SEEN /LPF
KETONES UR: NEGATIVE
LEUKOCYTE ESTERASE: NEGATIVE
Nitrites, Initial: NEGATIVE
PH: 5.5 (ref 5.0–8.0)
Protein, ur: NEGATIVE
RBC / HPF: NONE SEEN /HPF (ref 0–2)
Specific Gravity, Urine: 1.036 — ABNORMAL HIGH (ref 1.001–1.03)
Squamous Epithelial / LPF: NONE SEEN /HPF (ref <=5)
WBC UA: NONE SEEN /HPF (ref 0–5)

## 2018-01-02 LAB — LIPID PANEL
CHOLESTEROL: 142 mg/dL (ref <200)
HDL: 47 mg/dL (ref >40)
LDL CHOLESTEROL (CALC): 72 mg/dL
Non-HDL Cholesterol (Calc): 95 mg/dL (calc) (ref <130)
TRIGLYCERIDES: 147 mg/dL (ref <150)
Total CHOL/HDL Ratio: 3 (calc) (ref <5.0)

## 2018-01-02 LAB — NO CULTURE INDICATED

## 2018-01-02 LAB — PSA: PSA: 3.8 ng/mL (ref <=4.0)

## 2018-01-02 LAB — TSH: TSH: 1.49 m[IU]/L (ref 0.40–4.50)

## 2018-01-02 LAB — VITAMIN D 25 HYDROXY (VIT D DEFICIENCY, FRACTURES): Vit D, 25-Hydroxy: 58 ng/mL (ref 30–100)

## 2018-01-22 DIAGNOSIS — M67912 Unspecified disorder of synovium and tendon, left shoulder: Secondary | ICD-10-CM | POA: Diagnosis not present

## 2018-01-28 ENCOUNTER — Ambulatory Visit
Admission: RE | Admit: 2018-01-28 | Discharge: 2018-01-28 | Disposition: A | Discharge status: Home / Self Care | Payer: BLUE CROSS/BLUE SHIELD | Source: Ambulatory Visit | Attending: Adult Health | Admitting: Adult Health

## 2018-01-28 DIAGNOSIS — Z87891 Personal history of nicotine dependence: Secondary | ICD-10-CM | POA: Diagnosis not present

## 2018-01-28 DIAGNOSIS — F17200 Nicotine dependence, unspecified, uncomplicated: Secondary | ICD-10-CM

## 2018-01-28 DIAGNOSIS — Z122 Encounter for screening for malignant neoplasm of respiratory organs: Secondary | ICD-10-CM

## 2018-02-02 ENCOUNTER — Other Ambulatory Visit: Payer: Self-pay | Admitting: Physician Assistant

## 2018-02-02 DIAGNOSIS — N320 Bladder-neck obstruction: Secondary | ICD-10-CM

## 2018-02-04 ENCOUNTER — Encounter: Payer: Self-pay | Admitting: Adult Health

## 2018-02-04 ENCOUNTER — Other Ambulatory Visit: Payer: Self-pay | Admitting: Adult Health

## 2018-02-04 ENCOUNTER — Telehealth: Payer: Self-pay

## 2018-02-04 ENCOUNTER — Ambulatory Visit: Payer: BLUE CROSS/BLUE SHIELD | Admitting: Adult Health

## 2018-02-04 VITALS — BP 144/70 | HR 72 | Temp 97.3°F | Ht 70.0 in | Wt 194.0 lb

## 2018-02-04 DIAGNOSIS — J209 Acute bronchitis, unspecified: Secondary | ICD-10-CM

## 2018-02-04 DIAGNOSIS — J440 Chronic obstructive pulmonary disease with acute lower respiratory infection: Secondary | ICD-10-CM

## 2018-02-04 DIAGNOSIS — J410 Simple chronic bronchitis: Secondary | ICD-10-CM

## 2018-02-04 MED ORDER — AZITHROMYCIN 250 MG PO TABS: ORAL_TABLET | ORAL | 1 refills | Status: AC

## 2018-02-04 MED ORDER — PROMETHAZINE-DM 6.25-15 MG/5ML PO SYRP: 5.0000 mL | ORAL_SOLUTION | Freq: Four times a day (QID) | ORAL | 1 refills | Status: DC | PRN

## 2018-02-04 MED ORDER — PREDNISONE 20 MG PO TABS: ORAL_TABLET | ORAL | 0 refills | Status: DC

## 2018-02-04 MED ORDER — BENZONATATE 100 MG PO CAPS: 100.0000 mg | ORAL_CAPSULE | Freq: Three times a day (TID) | ORAL | 2 refills | Status: DC | PRN

## 2018-02-04 NOTE — Progress Notes (Signed)
Assessment and Plan:  Joe Green was seen today for uri.  Diagnoses and all orders for this visit:  COPD with acute bronchitis ([place]) Suggested symptomatic OTC remedies. Nasal saline spray for congestion. Nasal steroids, allergy pill, oral steroids Follow up as needed. Reminded to fill breo and restart to prevent long term complications -     predniSONE (DELTASONE) 20 MG tablet; 2 tablets daily for 3 days, 1 tablet daily for 4 days. -     promethazine-dextromethorphan (PROMETHAZINE-DM) 6.25-15 MG/5ML syrup; Take 5 mLs by mouth 4 (four) times daily as needed for cough. -     azithromycin (ZITHROMAX) 250 MG tablet; Take 2 tablets (500 mg) on  Day 1,  followed by 1 tablet (250 mg) once daily on Days 2 through 5.   Further disposition pending results of labs. Discussed med's effects and SE's.   Over 15 minutes of exam, counseling, chart review, and critical decision making was performed.   Future Appointments  Date Time Provider [place]  04/11/2018  8:45 AM [name], NP [facility] None  01/02/2019  9:00 AM [name], NP [facility] None    ------------------------------------------------------------------------------------------------------------------   HPI BP (!) 144/70   Pulse 72   Temp (!) 97.3 F (36.3 C)   Ht 5' 10"  (1.778 m)   Wt 194 lb (88 kg)   SpO2 94%   BMI 27.84 kg/m   67 y.o.male former smoker with COPD, presents for URI symptoms x 3 weeks; began with mildly productive cough, sore throat, progressed into hoarseness. Had sensation of chills and "was feeling really bad" 2 weeks ago at the beach, now having residual ongoing cough (mildly productive), continued sore throat, nasal congestion, some rib pain from coughing.   Denies HA, fever/chills, facial pain, ear pressure, dyspnea, palpitation, chest pains, N/V/D/abdominal pain.  He has tried robitussin which he reports did not help. He is also on mucinex which is helping.   He is also on zyrtec for  seasonal allergies. He is prescribed breo for COPD but ran out and has not been taking.  Past Medical History:  Diagnosis Date  . CAD (coronary artery disease)    a. s/p stent to RCA 2005; b. LHC 2006 after Nuc with inf ischemia: patent RCA stent, D1 80%, LAD 60% => med Rx;  c. LHC 04/18/11: Proximal LAD 60% (unchanged from previous catheterization), distal LAD 65% (small caliber), Dx 60%, proximal CFX 20%, mid CFX 50%, OM1 30%, proximal RCA 20%, mid RCA stent patent, EF 65%.=> med Rx  . GERD (gastroesophageal reflux disease)   . HLD (hyperlipidemia)   . Hypertension   . Obesity   . OSA (obstructive sleep apnea)    has a cpap-does not use-has a special mouthpiece he wears at night  . Type II or unspecified type diabetes mellitus without mention of complication, not stated as uncontrolled   . Vitamin D deficiency   . Wears glasses      No Known Allergies  Current Outpatient Medications on File Prior to Visit  Medication Sig  . aspirin EC 81 MG tablet Take 1 tablet (81 mg total) by mouth daily.  . benzonatate (TESSALON PERLES) 100 MG capsule Take 1 capsule (100 mg total) by mouth 3 (three) times daily as needed for cough (Max: 62m per day).  . blood glucose meter kit and supplies Dispense based on patient and insurance preference. Use up to four times daily as directed. (FOR ICD-9 250.00, 250.01).  . cetirizine (ZYRTEC) 10 MG tablet Take 10 mg by  mouth daily.  . Cholecalciferol (VITAMIN D PO) Take 10,000 mg by mouth daily.   . dapagliflozin propanediol (FARXIGA) 10 MG TABS tablet Take 10 mg by mouth daily.  Marland Kitchen doxazosin (CARDURA) 8 MG tablet TAKE 1 TABLET BY MOUTH ONCE DAILY  . finasteride (PROSCAR) 5 MG tablet Take 1 tablet (5 mg total) by mouth daily.  . fluticasone furoate-vilanterol (BREO ELLIPTA) 100-25 MCG/INH AEPB Inhale 1 puff into the lungs daily. Rinse mouth with water after each use  . glucose blood test strip Test blood sugars once daily.Dx: E11.9  . metFORMIN (GLUCOPHAGE)  1000 MG tablet TAKE 1 TABLET BY MOUTH TWICE DAILY  . ramipril (ALTACE) 5 MG capsule TAKE 1 CAPSULE BY MOUTH ONCE DAILY  . rosuvastatin (CRESTOR) 20 MG tablet Take 1 tablet (20 mg total) by mouth daily.   No current facility-administered medications on file prior to visit.     ROS: all negative except above.   Physical Exam:  BP (!) 144/70   Pulse 72   Temp (!) 97.3 F (36.3 C)   Ht 5' 10"  (1.778 m)   Wt 194 lb (88 kg)   SpO2 94%   BMI 27.84 kg/m   General Appearance: Well nourished, in no apparent distress. Eyes: PERRLA, EOMs, conjunctiva no swelling or erythema Sinuses: No Frontal/maxillary tenderness ENT/Mouth: Ext aud canals clear, TMs without erythema, bulging. No erythema, swelling, or exudate on post pharynx.  Tonsils not swollen or erythematous. Hearing normal.  Neck: Supple, thyroid normal.  Respiratory: Respiratory effort normal, BS equal bilaterally without rales, wheezing or stridor. He has scant rhonchi to bilateral bases.  Cardio: RRR with no MRGs. Brisk peripheral pulses without edema.  Abdomen: Soft, + BS.  Non tender, no guarding, rebound, hernias, masses. Lymphatics: Non tender without lymphadenopathy.  Musculoskeletal:Symmetrical strength, normal gait.  Skin: Warm, dry without rashes, lesions, ecchymosis.  Neuro: Cranial nerves intact. Normal muscle tone, no cerebellar symptoms. Sensation intact.  Psych: Awake and oriented X 3, normal affect, Insight and Judgment appropriate.     Izora Ribas, NP 10:56 AM [facility]

## 2018-02-04 NOTE — Telephone Encounter (Signed)
Promethazine is on backorder and would like to switch to something else please.

## 2018-02-04 NOTE — Patient Instructions (Signed)

## 2018-02-05 NOTE — Telephone Encounter (Signed)
LMTCB

## 2018-02-05 NOTE — Telephone Encounter (Signed)
Patient notified

## 2018-02-10 DIAGNOSIS — J209 Acute bronchitis, unspecified: Secondary | ICD-10-CM | POA: Diagnosis not present

## 2018-02-10 DIAGNOSIS — J0100 Acute maxillary sinusitis, unspecified: Secondary | ICD-10-CM | POA: Diagnosis not present

## 2018-02-11 DIAGNOSIS — H2513 Age-related nuclear cataract, bilateral: Secondary | ICD-10-CM | POA: Diagnosis not present

## 2018-02-11 DIAGNOSIS — E119 Type 2 diabetes mellitus without complications: Secondary | ICD-10-CM | POA: Diagnosis not present

## 2018-02-11 LAB — HM DIABETES EYE EXAM

## 2018-03-14 ENCOUNTER — Encounter: Payer: Self-pay | Admitting: *Deleted

## 2018-04-09 NOTE — Progress Notes (Signed)
FOLLOW UP  Assessment and Plan:   CAD Control blood pressure, cholesterol, glucose, increase exercise.  Follow up cardiology  Hypertension Well controlled with current medications  Monitor blood pressure at home; patient to call if consistently greater than 130/80 Continue DASH diet.   Reminder to go to the ER if any CP, SOB, nausea, dizziness, severe HA, changes vision/speech, left arm numbness and tingling and jaw pain.  Cholesterol Currently at goal; continue statin, discussed LDL goal < 70 Continue low cholesterol diet and exercise.  Check lipid panel.   Diabetes with other circulatory complications Continue medication: metformin, farxiga - consider adding cinnamon, increase exercise, watch diet Continue diet and exercise.  Perform daily foot/skin check, notify office of any concerning changes.  Check A1C  Overweight with co morbidities Long discussion about weight loss, diet, and exercise Recommended diet heavy in fruits and veggies and low in animal meats, cheeses, and dairy products, appropriate calorie intake Discussed ideal weight for height and initial weight goal (190 lb) Patient will work on increasing intentional exercise  Will follow up in 3 months  Vitamin D Def Near goal at last visit; continue supplementation  Defer Vit D level  COPD Quit smoking, doing well, notes significant improvement with BREO  Continue diet and meds as discussed. Further disposition pending results of labs. Discussed med's effects and SE's.   Over 30 minutes of exam, counseling, chart review, and critical decision making was performed.   Future Appointments  Date Time Provider [place]  04/11/2018  8:45 AM [name], NP [facility] None  01/02/2019  9:00 AM [name], NP [facility] None    ----------------------------------------------------------------------------------------------------------------------  HPI 67 y.o. male  presents for 3 month follow up  on CAD, hypertension, cholesterol, diabetes, weight and vitamin D deficiency. He has a history of CAD s/p PCI to [place]; He underwent LHC in2006 after nuc showed inf ischemia;He underwent LHC in 04/18/11 which showed a proximal LAD 60% (unchanged from previous catheterization), distal LAD 65% (small caliber), Dx 60%, proximal CFX 20%, mid CFX 50%, OM1 30%, proximal RCA 20%, mid RCA stent patent, EF 65%. Medical therapy recommended. Follows with cardiology - Dr. Johnsie Cancel and colleagues.   He is a former smoker, recently quit in 12/2017 and consequently with COPD; also OSA on CPAP; he is prescribed BREO, has run out but will refill today.   BMI is Body mass index is 27.84 kg/m., he has been working on diet, he is active at work, as a Water engineer.  Wt Readings from Last 3 Encounters:  04/11/18 194 lb (88 kg)  02/04/18 194 lb (88 kg)  01/01/18 196 lb (88.9 kg)   His blood pressure has been controlled at home, today their BP is BP: 118/74  He does not workout. He denies chest pain, shortness of breath, dizziness.   He is on cholesterol medication (rosuvastatin 20 mg three days a week) and denies myalgias. His cholesterol is at goal. The cholesterol last visit was:   Lab Results  Component Value Date   CHOL 142 01/01/2018   HDL 47 01/01/2018   LDLCALC 72 01/01/2018   TRIG 147 01/01/2018   CHOLHDL 3.0 01/01/2018    He has been working on diet and exercise for T2DM (on metformin, faxiga), and denies foot ulcerations, increased appetite, nausea, paresthesia of the feet, polydipsia, polyuria, visual disturbances, vomiting and weight loss. He does check sporadic fasting glucose, runs 130-140s. Last A1C in the office was:  Lab Results  Component Value Date   HGBA1C  7.7 (H) 01/01/2018   Patient is on Vitamin D supplement.   Lab Results  Component Value Date   VD25OH 58 01/01/2018        Current Medications:  Current Outpatient Medications on File Prior to Visit  Medication Sig  .  aspirin EC 81 MG tablet Take 1 tablet (81 mg total) by mouth daily.  . blood glucose meter kit and supplies Dispense based on patient and insurance preference. Use up to four times daily as directed. (FOR ICD-9 250.00, 250.01).  . cetirizine (ZYRTEC) 10 MG tablet Take 10 mg by mouth as needed.   . Cholecalciferol (VITAMIN D PO) Take 10,000 mg by mouth daily.   . dapagliflozin propanediol (FARXIGA) 10 MG TABS tablet Take 10 mg by mouth daily.  Marland Kitchen doxazosin (CARDURA) 8 MG tablet TAKE 1 TABLET BY MOUTH ONCE DAILY  . finasteride (PROSCAR) 5 MG tablet Take 1 tablet (5 mg total) by mouth daily.  . fluticasone furoate-vilanterol (BREO ELLIPTA) 100-25 MCG/INH AEPB Inhale 1 puff into the lungs daily. Rinse mouth with water after each use  . glucose blood test strip Test blood sugars once daily.Dx: E11.9  . metFORMIN (GLUCOPHAGE) 1000 MG tablet TAKE 1 TABLET BY MOUTH TWICE DAILY  . ramipril (ALTACE) 5 MG capsule TAKE 1 CAPSULE BY MOUTH ONCE DAILY  . rosuvastatin (CRESTOR) 20 MG tablet Take 1 tablet (20 mg total) by mouth daily.   No current facility-administered medications on file prior to visit.      Allergies: No Known Allergies   Medical History:  Past Medical History:  Diagnosis Date  . CAD (coronary artery disease)    a. s/p stent to RCA 2005; b. LHC 2006 after Nuc with inf ischemia: patent RCA stent, D1 80%, LAD 60% => med Rx;  c. LHC 04/18/11: Proximal LAD 60% (unchanged from previous catheterization), distal LAD 65% (small caliber), Dx 60%, proximal CFX 20%, mid CFX 50%, OM1 30%, proximal RCA 20%, mid RCA stent patent, EF 65%.=> med Rx  . GERD (gastroesophageal reflux disease)   . HLD (hyperlipidemia)   . Hypertension   . Obesity   . OSA (obstructive sleep apnea)    has a cpap-does not use-has a special mouthpiece he wears at night  . Type II or unspecified type diabetes mellitus without mention of complication, not stated as uncontrolled   . Vitamin D deficiency   . Wears glasses     Family history- Reviewed and unchanged Social history- Reviewed and unchanged   Review of Systems:  Review of Systems  Constitutional: Negative for malaise/fatigue and weight loss.  HENT: Negative for hearing loss and tinnitus.   Eyes: Negative for blurred vision and double vision.  Respiratory: Negative for cough, shortness of breath and wheezing.   Cardiovascular: Negative for chest pain, palpitations, orthopnea, claudication and leg swelling.  Gastrointestinal: Negative for abdominal pain, blood in stool, constipation, diarrhea, heartburn, melena, nausea and vomiting.  Genitourinary: Negative.   Musculoskeletal: Negative for joint pain and myalgias.  Skin: Negative for rash.  Neurological: Negative for dizziness, tingling, sensory change, weakness and headaches.  Endo/Heme/Allergies: Negative for polydipsia.  Psychiatric/Behavioral: Negative.   All other systems reviewed and are negative.     Physical Exam: BP 118/74   Pulse 70   Temp 97.7 F (36.5 C)   Ht 5' 10"  (1.778 m)   Wt 194 lb (88 kg)   SpO2 97%   BMI 27.84 kg/m  Wt Readings from Last 3 Encounters:  04/11/18 194 lb (88 kg)  02/04/18 194 lb (88 kg)  01/01/18 196 lb (88.9 kg)   General Appearance: Well nourished, in no apparent distress. Eyes: PERRLA, EOMs, conjunctiva no swelling or erythema Sinuses: No Frontal/maxillary tenderness ENT/Mouth: Ext aud canals clear, TMs without erythema, bulging. No erythema, swelling, or exudate on post pharynx.  Tonsils not swollen or erythematous. Hearing normal.  Neck: Supple, thyroid normal.  Respiratory: Respiratory effort normal, BS equal bilaterally without rales, rhonchi, wheezing or stridor.  Cardio: RRR with no MRGs. Brisk peripheral pulses without edema.  Abdomen: Soft, + BS.  Non tender, no guarding, rebound, hernias, masses. Lymphatics: Non tender without lymphadenopathy.  Musculoskeletal: Full ROM, 5/5 strength, Normal gait Skin: Warm, dry without rashes,  lesions, ecchymosis.  Neuro: Cranial nerves intact. No cerebellar symptoms.  Psych: Awake and oriented X 3, normal affect, Insight and Judgment appropriate.    Izora Ribas, NP 8:44 AM [facility]

## 2018-04-11 ENCOUNTER — Ambulatory Visit: Payer: BLUE CROSS/BLUE SHIELD | Admitting: Adult Health

## 2018-04-11 ENCOUNTER — Encounter: Payer: Self-pay | Admitting: Adult Health

## 2018-04-11 VITALS — BP 118/74 | HR 70 | Temp 97.7°F | Ht 70.0 in | Wt 194.0 lb

## 2018-04-11 DIAGNOSIS — E559 Vitamin D deficiency, unspecified: Secondary | ICD-10-CM

## 2018-04-11 DIAGNOSIS — E782 Mixed hyperlipidemia: Secondary | ICD-10-CM

## 2018-04-11 DIAGNOSIS — E1159 Type 2 diabetes mellitus with other circulatory complications: Secondary | ICD-10-CM

## 2018-04-11 DIAGNOSIS — Z79899 Other long term (current) drug therapy: Secondary | ICD-10-CM

## 2018-04-11 DIAGNOSIS — I25119 Atherosclerotic heart disease of native coronary artery with unspecified angina pectoris: Secondary | ICD-10-CM

## 2018-04-11 DIAGNOSIS — E663 Overweight: Secondary | ICD-10-CM

## 2018-04-11 DIAGNOSIS — J449 Chronic obstructive pulmonary disease, unspecified: Secondary | ICD-10-CM | POA: Diagnosis not present

## 2018-04-11 DIAGNOSIS — I10 Essential (primary) hypertension: Secondary | ICD-10-CM

## 2018-04-11 NOTE — Patient Instructions (Addendum)
Goals    . Exercise 150 minutes per week (moderate activity)    . HEMOGLOBIN A1C < 7    . LDL CALC < 70    . Weight (lb) < 190 lb (86.2 kg)       Restless Legs Syndrome Restless legs syndrome is a movement disorder. It may also be called a sensorimotor disorder.  CAUSES  No one knows what specifically causes restless legs syndrome, but it tends to run in families. It is also more common in people with low iron, in pregnancy, in people who need dialysis, and those with nerve damage (neuropathy).Some medications may make restless legs syndrome worse.Those medications include drugs to treat high blood pressure, some heart conditions, nausea, colds, allergies, and depression. SYMPTOMS Symptoms include uncomfortable sensations in the legs. These leg sensations are worse during periods of inactivity or rest. They are also worse while sitting or lying down. Individuals that have the disorder describe sensations in the legs that feel like:  Pulling.  Drawing.  Crawling.  Worming.  Boring.  Tingling.  Pins and needles.  Prickling.  Pain. The sensations are usually accompanied by an overwhelming urge to move the legs. Sudden muscle jerks may also occur. Movement provides temporary relief from the discomfort. In rare cases, the arms may also be affected. Symptoms may interfere with going to sleep (sleep onset insomnia). Restless legs syndrome may also be related to periodic limb movement disorder (PLMD). PLMD is another more common motor disorder. It also causes interrupted sleep. The symptoms from PLMD usually occur most often when you are awake. TREATMENT  Treatment for restless legs syndrome is symptomatic. This means that the symptoms are treated.   Massage and cold compresses may provide temporary relief.  Walk, stretch, or take a cold or hot bath.  Get regular exercise and a good night's sleep.  Avoid caffeine, alcohol, nicotine, and medications that can make it worse.  Do  activities that provide mental stimulation like discussions, needlework, and video games. These may be helpful if you are not able to walk or stretch. Some medications are effective in relieving the symptoms. However, many of these medications have side effects. Ask your caregiver about medications that may help your symptoms. Correcting iron deficiency may improve symptoms for some patients. Document Released: 07/21/2002 Document Revised: 12/15/2013 Document Reviewed: 10/27/2010 [facility] Patient Information 2015 [place], [place]. This information is not intended to replace advice given to you by your health care provider. Make sure you discuss any questions you have with your health care provider.

## 2018-04-12 LAB — CBC WITH DIFFERENTIAL/PLATELET
Basophils Absolute: 49 cells/uL (ref 0–200)
Basophils Relative: 0.6 %
EOS PCT: 3.3 %
Eosinophils Absolute: 267 cells/uL (ref 15–500)
HEMATOCRIT: 46.7 % (ref 38.5–50.0)
Hemoglobin: 15.9 g/dL (ref 13.2–17.1)
LYMPHS ABS: 1515 {cells}/uL (ref 850–3900)
MCH: 31.4 pg (ref 27.0–33.0)
MCHC: 34 g/dL (ref 32.0–36.0)
MCV: 92.1 fL (ref 80.0–100.0)
MPV: 12 fL (ref 7.5–12.5)
Monocytes Relative: 6.5 %
NEUTROS PCT: 70.9 %
Neutro Abs: 5743 cells/uL (ref 1500–7800)
PLATELETS: 154 10*3/uL (ref 140–400)
RBC: 5.07 10*6/uL (ref 4.20–5.80)
RDW: 12.7 % (ref 11.0–15.0)
TOTAL LYMPHOCYTE: 18.7 %
WBC mixed population: 527 cells/uL (ref 200–950)
WBC: 8.1 10*3/uL (ref 3.8–10.8)

## 2018-04-12 LAB — COMPLETE METABOLIC PANEL WITH GFR
AG Ratio: 1.9 (calc) (ref 1.0–2.5)
ALT: 16 U/L (ref 9–46)
AST: 13 U/L (ref 10–35)
Albumin: 4.2 g/dL (ref 3.6–5.1)
Alkaline phosphatase (APISO): 86 U/L (ref 40–115)
BUN/Creatinine Ratio: 16 (calc) (ref 6–22)
BUN: 11 mg/dL (ref 7–25)
CALCIUM: 9.4 mg/dL (ref 8.6–10.3)
CO2: 27 mmol/L (ref 20–32)
CREATININE: 0.69 mg/dL — AB (ref 0.70–1.25)
Chloride: 105 mmol/L (ref 98–110)
GFR, EST NON AFRICAN AMERICAN: 99 mL/min/{1.73_m2} (ref >=60)
GFR, Est African American: 115 mL/min/{1.73_m2} (ref >=60)
Globulin: 2.2 g/dL (calc) (ref 1.9–3.7)
Glucose, Bld: 136 mg/dL — ABNORMAL HIGH (ref 65–99)
Potassium: 4.3 mmol/L (ref 3.5–5.3)
Sodium: 139 mmol/L (ref 135–146)
Total Bilirubin: 0.5 mg/dL (ref 0.2–1.2)
Total Protein: 6.4 g/dL (ref 6.1–8.1)

## 2018-04-12 LAB — LIPID PANEL
CHOL/HDL RATIO: 2.7 (calc) (ref <5.0)
CHOLESTEROL: 123 mg/dL (ref <200)
HDL: 45 mg/dL (ref >40)
LDL Cholesterol (Calc): 64 mg/dL (calc)
Non-HDL Cholesterol (Calc): 78 mg/dL (calc) (ref <130)
Triglycerides: 61 mg/dL (ref <150)

## 2018-04-12 LAB — HEMOGLOBIN A1C
EAG (MMOL/L): 10.1 (calc)
HEMOGLOBIN A1C: 8 %{Hb} — AB (ref <5.7)
MEAN PLASMA GLUCOSE: 183 (calc)

## 2018-04-12 LAB — TSH: TSH: 0.83 mIU/L (ref 0.40–4.50)

## 2018-04-12 LAB — MAGNESIUM: Magnesium: 2 mg/dL (ref 1.5–2.5)

## 2018-04-16 ENCOUNTER — Other Ambulatory Visit: Payer: Self-pay | Admitting: Internal Medicine

## 2018-04-16 DIAGNOSIS — N320 Bladder-neck obstruction: Secondary | ICD-10-CM

## 2018-05-17 ENCOUNTER — Other Ambulatory Visit: Payer: Self-pay

## 2018-05-17 DIAGNOSIS — N320 Bladder-neck obstruction: Secondary | ICD-10-CM

## 2018-05-17 MED ORDER — DOXAZOSIN MESYLATE 8 MG PO TABS: 8.0000 mg | ORAL_TABLET | Freq: Every day | ORAL | 0 refills | Status: DC

## 2018-07-07 ENCOUNTER — Other Ambulatory Visit: Payer: Self-pay | Admitting: Physician Assistant

## 2018-07-24 NOTE — Progress Notes (Deleted)
FOLLOW UP  Assessment and Plan:   CAD Control blood pressure, cholesterol, glucose, increase exercise.  Follow up cardiology  Hypertension Well controlled with current medications  Monitor blood pressure at home; patient to call if consistently greater than 130/80 Continue DASH diet.   Reminder to go to the ER if any CP, SOB, nausea, dizziness, severe HA, changes vision/speech, left arm numbness and tingling and jaw pain.  Cholesterol Currently at goal; continue statin, discussed LDL goal < 70 Continue low cholesterol diet and exercise.  Check lipid panel.   Diabetes with other circulatory complications Continue medication: metformin, farxiga - consider adding januvia/onglyza *** vs bydureon *** Continue diet and exercise.  Perform daily foot/skin check, notify office of any concerning changes.  Check A1C  Overweight with co morbidities Long discussion about weight loss, diet, and exercise Recommended diet heavy in fruits and veggies and low in animal meats, cheeses, and dairy products, appropriate calorie intake Discussed ideal weight for height and initial weight goal (190 lb) Patient will work on increasing intentional exercise  Will follow up in 3 months  Vitamin D Def Near goal at last visit; continue supplementation  Defer Vit D level  COPD Quit smoking, doing well, notes significant improvement with BREO  Continue diet and meds as discussed. Further disposition pending results of labs. Discussed med's effects and SE's.   Over 30 minutes of exam, counseling, chart review, and critical decision making was performed.   Future Appointments  Date Time Provider [place]  07/26/2018  8:45 AM [name], NP [facility] None  10/25/2018  8:45 AM [name], NP [facility] None  01/02/2019  9:00 AM [name], NP [facility] None     ----------------------------------------------------------------------------------------------------------------------  HPI 67 y.o. male  presents for 3 month follow up on CAD, hypertension, cholesterol, diabetes, weight and vitamin D deficiency. He has a history of CAD s/p PCI to [place]; He underwent LHC in2006 after nuc showed inf ischemia;He underwent LHC in 04/18/11 which showed a proximal LAD 60% (unchanged from previous catheterization), distal LAD 65% (small caliber), Dx 60%, proximal CFX 20%, mid CFX 50%, OM1 30%, proximal RCA 20%, mid RCA stent patent, EF 65%. Medical therapy recommended. Follows with cardiology - Dr. Johnsie Cancel and colleagues.   He is a former smoker, recently quit in 12/2017 and consequently with COPD; also OSA on CPAP; he is prescribed BREO, reports significant improvement in respiratory symptoms on inhaler.   BMI is There is no height or weight on file to calculate BMI., he has been working on diet, he is active at work, as a Water engineer.  *** goal 190lb *** Wt Readings from Last 3 Encounters:  04/11/18 194 lb (88 kg)  02/04/18 194 lb (88 kg)  01/01/18 196 lb (88.9 kg)   His blood pressure has been controlled at home, today their BP is    He does not workout. He denies chest pain, shortness of breath, dizziness.   He is on cholesterol medication (rosuvastatin 20 mg three days a week) and denies myalgias. His cholesterol is at goal. The cholesterol last visit was:   Lab Results  Component Value Date   CHOL 123 04/11/2018   HDL 45 04/11/2018   LDLCALC 64 04/11/2018   TRIG 61 04/11/2018   CHOLHDL 2.7 04/11/2018    He has been working on diet and exercise for T2DM (on metformin, faxiga), and denies foot ulcerations, increased appetite, nausea, paresthesia of the feet, polydipsia, polyuria, visual disturbances, vomiting and weight loss. He does check sporadic  fasting glucose, runs 130-140s. Last A1C in the office was:  Lab Results  Component Value Date    HGBA1C 8.0 (H) 04/11/2018   Patient is on Vitamin D supplement.   Lab Results  Component Value Date   VD25OH 58 01/01/2018        Current Medications:  Current Outpatient Medications on File Prior to Visit  Medication Sig  . aspirin EC 81 MG tablet Take 1 tablet (81 mg total) by mouth daily.  . blood glucose meter kit and supplies Dispense based on patient and insurance preference. Use up to four times daily as directed. (FOR ICD-9 250.00, 250.01).  . cetirizine (ZYRTEC) 10 MG tablet Take 10 mg by mouth as needed.   . Cholecalciferol (VITAMIN D PO) Take 10,000 mg by mouth daily.   . dapagliflozin propanediol (FARXIGA) 10 MG TABS tablet Take 10 mg by mouth daily.  Marland Kitchen doxazosin (CARDURA) 8 MG tablet Take 1 tablet (8 mg total) by mouth daily.  . finasteride (PROSCAR) 5 MG tablet Take 1 tablet (5 mg total) by mouth daily.  . fluticasone furoate-vilanterol (BREO ELLIPTA) 100-25 MCG/INH AEPB Inhale 1 puff into the lungs daily. Rinse mouth with water after each use  . glucose blood test strip Test blood sugars once daily.Dx: E11.9  . metFORMIN (GLUCOPHAGE) 1000 MG tablet TAKE 1 TABLET BY MOUTH TWICE DAILY  . ramipril (ALTACE) 5 MG capsule TAKE 1 CAPSULE BY MOUTH ONCE DAILY  . rosuvastatin (CRESTOR) 20 MG tablet TAKE 1 TABLET BY MOUTH ONCE DAILY   No current facility-administered medications on file prior to visit.      Allergies: No Known Allergies   Medical History:  Past Medical History:  Diagnosis Date  . CAD (coronary artery disease)    a. s/p stent to RCA 2005; b. LHC 2006 after Nuc with inf ischemia: patent RCA stent, D1 80%, LAD 60% => med Rx;  c. LHC 04/18/11: Proximal LAD 60% (unchanged from previous catheterization), distal LAD 65% (small caliber), Dx 60%, proximal CFX 20%, mid CFX 50%, OM1 30%, proximal RCA 20%, mid RCA stent patent, EF 65%.=> med Rx  . GERD (gastroesophageal reflux disease)   . HLD (hyperlipidemia)   . Hypertension   . Obesity   . OSA (obstructive sleep  apnea)    has a cpap-does not use-has a special mouthpiece he wears at night  . Type II or unspecified type diabetes mellitus without mention of complication, not stated as uncontrolled   . Vitamin D deficiency   . Wears glasses    Family history- Reviewed and unchanged Social history- Reviewed and unchanged   Review of Systems:  Review of Systems  Constitutional: Negative for malaise/fatigue and weight loss.  HENT: Negative for hearing loss and tinnitus.   Eyes: Negative for blurred vision and double vision.  Respiratory: Negative for cough, shortness of breath and wheezing.   Cardiovascular: Negative for chest pain, palpitations, orthopnea, claudication and leg swelling.  Gastrointestinal: Negative for abdominal pain, blood in stool, constipation, diarrhea, heartburn, melena, nausea and vomiting.  Genitourinary: Negative.   Musculoskeletal: Negative for joint pain and myalgias.  Skin: Negative for rash.  Neurological: Negative for dizziness, tingling, sensory change, weakness and headaches.  Endo/Heme/Allergies: Negative for polydipsia.  Psychiatric/Behavioral: Negative.   All other systems reviewed and are negative.     Physical Exam: There were no vitals taken for this visit. Wt Readings from Last 3 Encounters:  04/11/18 194 lb (88 kg)  02/04/18 194 lb (88 kg)  01/01/18 196 lb (  88.9 kg)   General Appearance: Well nourished, in no apparent distress. Eyes: PERRLA, EOMs, conjunctiva no swelling or erythema Sinuses: No Frontal/maxillary tenderness ENT/Mouth: Ext aud canals clear, TMs without erythema, bulging. No erythema, swelling, or exudate on post pharynx.  Tonsils not swollen or erythematous. Hearing normal.  Neck: Supple, thyroid normal.  Respiratory: Respiratory effort normal, BS equal bilaterally without rales, rhonchi, wheezing or stridor.  Cardio: RRR with no MRGs. Brisk peripheral pulses without edema.  Abdomen: Soft, + BS.  Non tender, no guarding, rebound,  hernias, masses. Lymphatics: Non tender without lymphadenopathy.  Musculoskeletal: Full ROM, 5/5 strength, Normal gait Skin: Warm, dry without rashes, lesions, ecchymosis.  Neuro: Cranial nerves intact. No cerebellar symptoms.  Psych: Awake and oriented X 3, normal affect, Insight and Judgment appropriate.    Izora Ribas, NP 1:29 PM [facility]

## 2018-07-25 ENCOUNTER — Other Ambulatory Visit: Payer: Self-pay | Admitting: Internal Medicine

## 2018-07-25 ENCOUNTER — Ambulatory Visit: Payer: BLUE CROSS/BLUE SHIELD | Admitting: Physician Assistant

## 2018-07-25 ENCOUNTER — Encounter: Payer: Self-pay | Admitting: Physician Assistant

## 2018-07-25 VITALS — BP 140/70 | HR 91 | Temp 98.5°F | Ht 70.0 in | Wt 192.6 lb

## 2018-07-25 DIAGNOSIS — J449 Chronic obstructive pulmonary disease, unspecified: Secondary | ICD-10-CM | POA: Diagnosis not present

## 2018-07-25 DIAGNOSIS — E1159 Type 2 diabetes mellitus with other circulatory complications: Secondary | ICD-10-CM | POA: Diagnosis not present

## 2018-07-25 DIAGNOSIS — E782 Mixed hyperlipidemia: Secondary | ICD-10-CM

## 2018-07-25 DIAGNOSIS — I10 Essential (primary) hypertension: Secondary | ICD-10-CM | POA: Diagnosis not present

## 2018-07-25 DIAGNOSIS — Z79899 Other long term (current) drug therapy: Secondary | ICD-10-CM

## 2018-07-25 DIAGNOSIS — J209 Acute bronchitis, unspecified: Secondary | ICD-10-CM

## 2018-07-25 DIAGNOSIS — E119 Type 2 diabetes mellitus without complications: Secondary | ICD-10-CM

## 2018-07-25 DIAGNOSIS — J440 Chronic obstructive pulmonary disease with acute lower respiratory infection: Secondary | ICD-10-CM

## 2018-07-25 DIAGNOSIS — J410 Simple chronic bronchitis: Secondary | ICD-10-CM

## 2018-07-25 MED ORDER — AZITHROMYCIN 250 MG PO TABS: ORAL_TABLET | ORAL | 1 refills | Status: AC

## 2018-07-25 MED ORDER — METFORMIN HCL 1000 MG PO TABS: 1000.0000 mg | ORAL_TABLET | Freq: Two times a day (BID) | ORAL | 1 refills | Status: DC

## 2018-07-25 MED ORDER — PROMETHAZINE-DM 6.25-15 MG/5ML PO SYRP: 5.0000 mL | ORAL_SOLUTION | Freq: Four times a day (QID) | ORAL | 1 refills | Status: DC | PRN

## 2018-07-25 MED ORDER — ALBUTEROL SULFATE HFA 108 (90 BASE) MCG/ACT IN AERS: 2.0000 | INHALATION_SPRAY | RESPIRATORY_TRACT | 0 refills | Status: DC | PRN

## 2018-07-25 NOTE — Patient Instructions (Addendum)
BP is up some today, this can be from cold meds Watch at home, call if stays above 135/80  Use the trelegy sample for 2 weeks and then go back to your breo.   Chronic Obstructive Pulmonary Disease Exacerbation Chronic obstructive pulmonary disease (COPD) is a long-term (chronic) condition that affects the lungs. COPD is a general term that can be used to describe many different lung problems that cause lung swelling (inflammation) and limit airflow, including chronic bronchitis and emphysema. COPD exacerbations are episodes when breathing symptoms become much worse and require extra treatment. COPD exacerbations are usually caused by infections. Without treatment, COPD exacerbations can be severe and even life threatening. Frequent COPD exacerbations can cause further damage to the lungs. What are the causes? This condition may be caused by:  Respiratory infections, including viral and bacterial infections.  Exposure to smoke.  Exposure to air pollution, chemical fumes, or dust.  Things that give you an allergic reaction (allergens).  Not taking your usual COPD medicines as directed.  Underlying medical problems, such as congestive heart failure or infections not involving the lungs.  In many cases, the cause (trigger) of this condition is not known. What increases the risk? The following factors may make you more likely to develop this condition:  Smoking cigarettes.  Old age.  Frequent prior COPD exacerbations.  What are the signs or symptoms? Symptoms of this condition include:  Increased coughing.  Increased production of mucus from your lungs (sputum).  Increased wheezing.  Increased shortness of breath.  Rapid or labored breathing.  Chest tightness.  Less energy than usual.  Sleep disruption from symptoms.  Confusion or increased sleepiness.  Often these symptoms happen or get worse even with the use of medicines. How is this diagnosed? This condition is  diagnosed based on:  Your medical history.  A physical exam.  You may also have tests, including:  A chest X-ray.  Blood tests.  Lung (pulmonary) function tests.  How is this treated? Treatment for this condition depends on the severity and cause of the symptoms. You may need to be admitted to a hospital for treatment. Some of the treatments commonly used to treat COPD exacerbations are:  Antibiotic medicines. These may be used for severe exacerbations caused by a lung infection, such as pneumonia.  Bronchodilators. These are inhaled medicines that expand the air passages and allow increased airflow.  Steroid medicines. These act to reduce inflammation in the airways. They may be given with an inhaler, taken by mouth, or given through an IV tube inserted into one of your veins.  Supplemental oxygen therapy.  Airway clearing techniques, such as noninvasive ventilation (NIV) and positive expiratory pressure (PEP). These provide respiratory support through a mask or other noninvasive device. An example of this would be using a continuous positive airway pressure (CPAP) machine to improve delivery of oxygen into your lungs.  Follow these instructions at home: Medicines  Take over-the-counter and prescription medicines only as told by your health care provider. It is important to use correct technique with inhaled medicines.  If you were prescribed an antibiotic medicine or oral steroid, take it as told by your health care provider. Do not stop taking the medicine even if you start to feel better. Lifestyle  Eat a healthy diet.  Exercise regularly.  Get plenty of sleep.  Avoid exposure to all substances that irritate the airway, especially to tobacco smoke.  Wash your hands often with soap and water to reduce the risk of infection.  If soap and water are not available, use hand sanitizer.  During flu season, avoid enclosed spaces that are crowded with people. General  instructions  Drink enough fluid to keep your urine clear or pale yellow (unless you have a medical condition that requires fluid restriction).  Use a cool mist vaporizer. This humidifies the air and makes it easier for you to clear your chest when you cough.  If you have a home nebulizer and oxygen, continue to use them as told by your health care provider.  Keep all follow-up visits as told by your health care provider. This is important. How is this prevented?  Stay up-to-date on pneumococcal and influenza (flu) vaccines. A flu shot is recommended every year to help prevent exacerbations.  Do not use any products that contain nicotine or tobacco, such as cigarettes and e-cigarettes. Quitting smoking is very important in preventing COPD from getting worse and in preventing exacerbations from happening as often. If you need help quitting, ask your health care provider.  Follow all instructions for pulmonary rehabilitation after a recent exacerbation. This can help prevent future exacerbations.  Work with your health care provider to develop and follow an action plan. This tells you what steps to take when you experience certain symptoms. Contact a health care provider if:  You have a worsening of your regular COPD symptoms. Get help right away if:  You have worsening shortness of breath, even when resting.  You have trouble talking.  You have severe chest pain.  You cough up blood.  You have a fever.  You have weakness, vomit repeatedly, or faint.  You feel confused.  You are not able to sleep because of your symptoms.  You have trouble doing daily activities. Summary  COPD exacerbations are episodes when breathing symptoms become much worse and require extra treatment above your normal treatment.  Exacerbations can be severe and even life threatening. Frequent COPD exacerbations can cause further damage to your lungs.  COPD exacerbations are usually triggered by  infections such as the flu, colds, and even pneumonia.  Treatment for this condition depends on the severity and cause of the symptoms. You may need to be admitted to a hospital for treatment.  Quitting smoking is very important to prevent COPD from getting worse and to prevent exacerbations from happening as often. This information is not intended to replace advice given to you by your health care provider. Make sure you discuss any questions you have with your health care provider. Document Released: 05/28/2007 Document Revised: 09/04/2016 Document Reviewed: 09/04/2016 Elsevier Interactive Patient Education  [employer].

## 2018-07-25 NOTE — Progress Notes (Signed)
Patient ID: Joe Green, male   DOB: April 14, 1951, 67 y.o.   MRN: [id number]  Assessment and Plan:    Essential hypertension - continue medications, DASH diet, exercise and monitor at home. Call if greater than 130/80.  -     CBC with Differential/Platelet -     BASIC METABOLIC PANEL WITH GFR -     Hepatic function panel -     TSH  Chronic obstructive pulmonary disease, unspecified COPD type ([place]) Commended on stopping smoking, continue meds.   OSA and COPD overlap syndrome ([facility]) Continue CPAP  Type 2 diabetes mellitus without complication, without long-term current use of insulin ([facility]) -     Hemoglobin A1c Discussed general issues about diabetes pathophysiology and management., Educational material distributed., Suggested low cholesterol diet., Encouraged aerobic exercise., Discussed foot care., Reminded to get yearly retinal exam.  Mixed hyperlipidemia -continue medications, check lipids, decrease fatty foods, increase activity. -     Lipid panel  COPD exacerbation Zpak, cough syrup, will increase breo to trelegy for 2 weeks and then back to breo  Smoker's cough Cough syrup given  Continue diet and meds as discussed. Further disposition pending results of labs. Discussed med's effects and SE's.   Future Appointments  Date Time Provider [place]  07/26/2018  8:45 AM [name], NP [facility] None  10/25/2018  8:45 AM [name], NP [facility] None  01/02/2019  9:00 AM [name], NP [facility] None    HPI 67 y.o. male  presents for 3 month follow up with hypertension, hyperlipidemia, diabetes and vitamin D deficiency.  History of COPD, has 4 days of feeling achy, cough with white sputum, denies fever, chills, N/V. Mucinex has helped some. Quit smoking sept 2019. He is on Breo.    His blood pressure has been controlled at home, today their BP is BP: 140/70.He does workout. He denies chest pain, shortness of breath, dizziness. He has quit smoking.  Had normal stress test 2018, carotid US 2012.    He is on cholesterol medication and denies myalgias, he is on crestor.  His cholesterol is at goal less than 70. The cholesterol was:  04/11/2018: Cholesterol 123; HDL 45; LDL Cholesterol (Calc) 64; Triglycerides 61   He has been working on diet and exercise for diabetes without complications, he is on bASA, he is on ACE/ARB, and denies  foot ulcerations, hyperglycemia, hypoglycemia , increased appetite, nausea, paresthesia of the feet, polydipsia, polyuria, visual disturbances, vomiting and weight loss. He is on MF and farxiga,  he checks his sugars 3 x a week, sugars running 110-140s in AM. Last A1C was: 04/11/2018: Hgb A1c MFr Bld 8.0    Patient is on Vitamin D supplement. 01/01/2018: Vit D, 25-Hydroxy 58   BMI is Body mass index is 27.64 kg/m., he is working on diet and exercise. Wt Readings from Last 3 Encounters:  07/25/18 192 lb 9.6 oz (87.4 kg)  04/11/18 194 lb (88 kg)  02/04/18 194 lb (88 kg)    Current Medications:  Current Outpatient Medications on File Prior to Visit  Medication Sig Dispense Refill  . aspirin EC 81 MG tablet Take 1 tablet (81 mg total) by mouth daily. 90 tablet 3  . blood glucose meter kit and supplies Dispense based on patient and insurance preference. Use up to four times daily as directed. (FOR ICD-9 250.00, 250.01). 1 each 0  . cetirizine (ZYRTEC) 10 MG tablet Take 10 mg by mouth as needed.     . Cholecalciferol (VITAMIN D  PO) Take 10,000 mg by mouth daily.     . dapagliflozin propanediol (FARXIGA) 10 MG TABS tablet Take 10 mg by mouth daily. 90 tablet 1  . doxazosin (CARDURA) 8 MG tablet Take 1 tablet (8 mg total) by mouth daily. 90 tablet 0  . finasteride (PROSCAR) 5 MG tablet Take 1 tablet (5 mg total) by mouth daily. 90 tablet 1  . fluticasone furoate-vilanterol (BREO ELLIPTA) 100-25 MCG/INH AEPB Inhale 1 puff into the lungs daily. Rinse mouth with water after each use 1 each 3  . glucose blood test strip  Test blood sugars once daily.Dx: E11.9 100 each 2  . metFORMIN (GLUCOPHAGE) 1000 MG tablet TAKE 1 TABLET BY MOUTH TWICE DAILY 180 tablet 1  . ramipril (ALTACE) 5 MG capsule TAKE 1 CAPSULE BY MOUTH ONCE DAILY 90 capsule 1  . rosuvastatin (CRESTOR) 20 MG tablet TAKE 1 TABLET BY MOUTH ONCE DAILY 90 tablet 1   No current facility-administered medications on file prior to visit.    Medical History:  Past Medical History:  Diagnosis Date  . CAD (coronary artery disease)    a. s/p stent to RCA 2005; b. LHC 2006 after Nuc with inf ischemia: patent RCA stent, D1 80%, LAD 60% => med Rx;  c. LHC 04/18/11: Proximal LAD 60% (unchanged from previous catheterization), distal LAD 65% (small caliber), Dx 60%, proximal CFX 20%, mid CFX 50%, OM1 30%, proximal RCA 20%, mid RCA stent patent, EF 65%.=> med Rx  . GERD (gastroesophageal reflux disease)   . HLD (hyperlipidemia)   . Hypertension   . Obesity   . OSA (obstructive sleep apnea)    has a cpap-does not use-has a special mouthpiece he wears at night  . Type II or unspecified type diabetes mellitus without mention of complication, not stated as uncontrolled   . Vitamin D deficiency   . Wears glasses    Allergies: No Known Allergies   Review of Systems:  Review of Systems  Constitutional: Negative for chills, fever and malaise/fatigue.  HENT: Negative for congestion, ear pain and sore throat.   Respiratory: Negative for cough, shortness of breath and wheezing.   Cardiovascular: Negative for chest pain, palpitations and leg swelling.  Gastrointestinal: Negative for blood in stool, constipation, diarrhea, heartburn and melena.  Genitourinary: Negative.   Musculoskeletal: Positive for joint pain (bilateral knees).  Neurological: Negative for dizziness, sensory change, loss of consciousness and headaches.  Psychiatric/Behavioral: Negative for depression. The patient is not nervous/anxious and does not have insomnia.     Family history- Review and  unchanged  Social history- Review and unchanged  Physical Exam: BP 140/70   Pulse 91   Temp 98.5 F (36.9 C)   Ht 5' 10"  (1.778 m)   Wt 192 lb 9.6 oz (87.4 kg)   SpO2 95%   BMI 27.64 kg/m  Wt Readings from Last 3 Encounters:  07/25/18 192 lb 9.6 oz (87.4 kg)  04/11/18 194 lb (88 kg)  02/04/18 194 lb (88 kg)   General Appearance: Well nourished well developed, non-toxic appearing, in no apparent distress. Eyes: PERRLA, EOMs, conjunctiva no swelling or erythema ENT/Mouth: Ear canals clear with no erythema, swelling, or discharge.  TMs normal bilaterally, oropharynx clear, moist, with no exudate.   Neck: Supple, thyroid normal, no JVD, no cervical adenopathy.  Respiratory: Respiratory effort normal, breath sounds clear with coarse breath sounds at the bases, no wheeze, rhonchi or rales noted.  No retractions, no accessory muscle usage Cardio: RRR with no MRGs. No noted  edema.  Abdomen: Soft, + BS.  Non tender, no guarding, rebound, hernias, masses. Musculoskeletal: Full ROM, 5/5 strength, normal gait Skin: Warm, dry without rashes, lesions, ecchymosis.  Neuro: Awake and oriented X 3, Cranial nerves intact. No cerebellar symptoms.  Psych: normal affect, Insight and Judgment appropriate.    Vicie Mutters, PA-C 11:39 AM [facility]

## 2018-07-26 ENCOUNTER — Ambulatory Visit: Payer: Self-pay | Admitting: Adult Health

## 2018-08-05 LAB — CBC WITH DIFFERENTIAL/PLATELET
Absolute Monocytes: 1009 cells/uL — ABNORMAL HIGH (ref 200–950)
BASOS ABS: 66 {cells}/uL (ref 0–200)
Basophils Relative: 0.5 %
Eosinophils Absolute: 445 cells/uL (ref 15–500)
Eosinophils Relative: 3.4 %
HEMATOCRIT: 50.9 % — AB (ref 38.5–50.0)
HEMOGLOBIN: 16.8 g/dL (ref 13.2–17.1)
Lymphs Abs: 1297 cells/uL (ref 850–3900)
MCH: 30.4 pg (ref 27.0–33.0)
MCHC: 33 g/dL (ref 32.0–36.0)
MCV: 92.2 fL (ref 80.0–100.0)
MPV: 12 fL (ref 7.5–12.5)
Monocytes Relative: 7.7 %
NEUTROS ABS: 10284 {cells}/uL — AB (ref 1500–7800)
Neutrophils Relative %: 78.5 %
Platelets: 161 10*3/uL (ref 140–400)
RBC: 5.52 10*6/uL (ref 4.20–5.80)
RDW: 12 % (ref 11.0–15.0)
Total Lymphocyte: 9.9 %
WBC: 13.1 10*3/uL — ABNORMAL HIGH (ref 3.8–10.8)

## 2018-08-05 LAB — LIPID PANEL
Cholesterol: 144 mg/dL (ref <200)
HDL: 35 mg/dL — ABNORMAL LOW (ref >40)
LDL Cholesterol (Calc): 80 mg/dL (calc)
NON-HDL CHOLESTEROL (CALC): 109 mg/dL (ref <130)
Total CHOL/HDL Ratio: 4.1 (calc) (ref <5.0)
Triglycerides: 196 mg/dL — ABNORMAL HIGH (ref <150)

## 2018-08-05 LAB — COMPLETE METABOLIC PANEL WITH GFR
AG Ratio: 1.7 (calc) (ref 1.0–2.5)
ALT: 17 U/L (ref 9–46)
AST: 16 U/L (ref 10–35)
Albumin: 4.5 g/dL (ref 3.6–5.1)
Alkaline phosphatase (APISO): 111 U/L (ref 40–115)
BILIRUBIN TOTAL: 0.6 mg/dL (ref 0.2–1.2)
BUN: 12 mg/dL (ref 7–25)
CO2: 24 mmol/L (ref 20–32)
Calcium: 9.6 mg/dL (ref 8.6–10.3)
Chloride: 104 mmol/L (ref 98–110)
Creat: 0.7 mg/dL (ref 0.70–1.25)
GFR, Est African American: 114 mL/min/{1.73_m2} (ref >=60)
GFR, Est Non African American: 98 mL/min/{1.73_m2} (ref >=60)
GLUCOSE: 174 mg/dL — AB (ref 65–99)
Globulin: 2.6 g/dL (calc) (ref 1.9–3.7)
Potassium: 4 mmol/L (ref 3.5–5.3)
Sodium: 139 mmol/L (ref 135–146)
TOTAL PROTEIN: 7.1 g/dL (ref 6.1–8.1)

## 2018-08-05 LAB — TSH: TSH: 0.75 mIU/L (ref 0.40–4.50)

## 2018-08-05 LAB — HEMOGLOBIN A1C
EAG (MMOL/L): 10.4 (calc)
Hgb A1c MFr Bld: 8.2 % of total Hgb — ABNORMAL HIGH (ref <5.7)
Mean Plasma Glucose: 189 (calc)

## 2018-08-14 ENCOUNTER — Other Ambulatory Visit: Payer: Self-pay | Admitting: Internal Medicine

## 2018-09-19 ENCOUNTER — Other Ambulatory Visit: Payer: Self-pay | Admitting: Physician Assistant

## 2018-10-18 ENCOUNTER — Telehealth: Payer: Self-pay | Admitting: Physician Assistant

## 2018-10-18 DIAGNOSIS — R350 Frequency of micturition: Principal | ICD-10-CM

## 2018-10-18 DIAGNOSIS — N401 Enlarged prostate with lower urinary tract symptoms: Secondary | ICD-10-CM

## 2018-10-18 NOTE — Telephone Encounter (Signed)
-----   Message from Elenor Quinones, [place] sent at 10/18/2018 11:18 AM EST ----- Regarding: med Contact: [phone] Pt reports that his meds for his enlarged prostate is no longer working and he would like a referral to UROLOGY please and thank you.  Per office note

## 2018-10-19 DIAGNOSIS — N3001 Acute cystitis with hematuria: Secondary | ICD-10-CM | POA: Diagnosis not present

## 2018-10-19 DIAGNOSIS — R300 Dysuria: Secondary | ICD-10-CM | POA: Diagnosis not present

## 2018-10-23 ENCOUNTER — Telehealth: Payer: Self-pay | Admitting: Physician Assistant

## 2018-10-23 DIAGNOSIS — R300 Dysuria: Secondary | ICD-10-CM | POA: Diagnosis not present

## 2018-10-23 DIAGNOSIS — N401 Enlarged prostate with lower urinary tract symptoms: Secondary | ICD-10-CM | POA: Diagnosis not present

## 2018-10-23 DIAGNOSIS — Z79899 Other long term (current) drug therapy: Secondary | ICD-10-CM | POA: Diagnosis not present

## 2018-10-23 DIAGNOSIS — B3741 Candidal cystitis and urethritis: Secondary | ICD-10-CM | POA: Diagnosis not present

## 2018-10-23 DIAGNOSIS — R81 Glycosuria: Secondary | ICD-10-CM | POA: Diagnosis not present

## 2018-10-23 NOTE — Telephone Encounter (Signed)
Need to switch medications, please make an office visit to discuss

## 2018-10-23 NOTE — Telephone Encounter (Signed)
Patient has been on Zpak for URI in Dec and then went to urgent care for dysuria in Jan and put on Cipro. Was recently seen at urology by NP, Clarise Cruz that was nice enough to contact our office.  He is on farxiga and shows candidiasis on urinalysis, currently on diflucan. With his symptoms we will discuss switching his farxiga to another DM medication.   Lab Results  Component Value Date   HGBA1C 8.2 (H) 07/25/2018

## 2018-10-25 ENCOUNTER — Ambulatory Visit: Payer: Self-pay | Admitting: Adult Health

## 2018-11-13 IMAGING — CT CT CHEST LUNG CANCER SCREENING LOW DOSE W/O CM
2 of 5 series · 15 of 40 positions shown, 18 images · non-contrast
Comparison: 12/15/2016

CLINICAL DATA: 67-year-old male with 49 pack year history of
smoking. Lung cancer screening.

EXAM:
CT CHEST WITHOUT CONTRAST LOW-DOSE FOR LUNG CANCER SCREENING
TECHNIQUE: Multidetector CT imaging of the chest was performed following the
standard protocol without IV contrast.

[Series 4: lung 1.00 br60 · axial · 0.76mm/px · z∈[-979,-699]mm · 12 of 310 slices shown, 15 images]
[im 15/310  mediastinal]
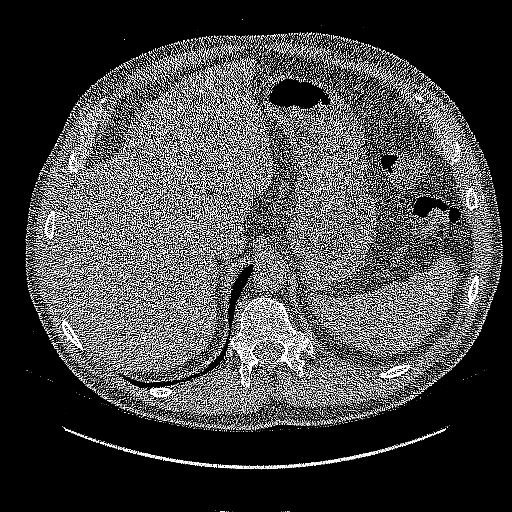
[im 15/310  lung]
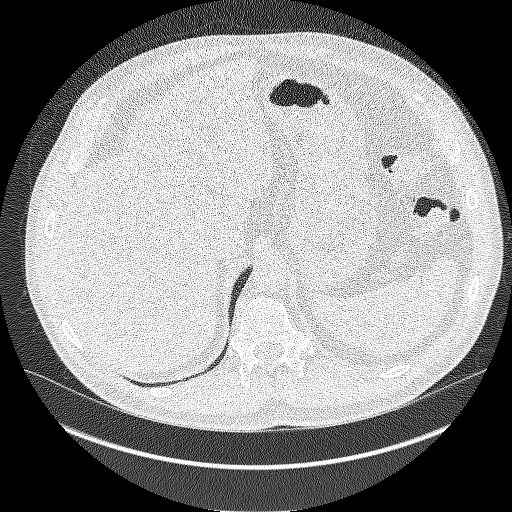
[im 45/310  lung]
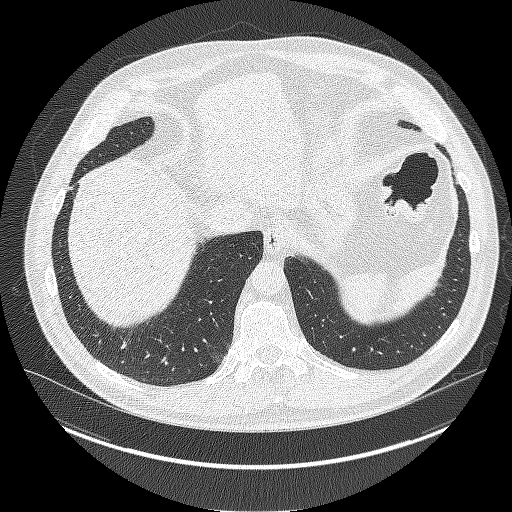
[im 74/310  lung]
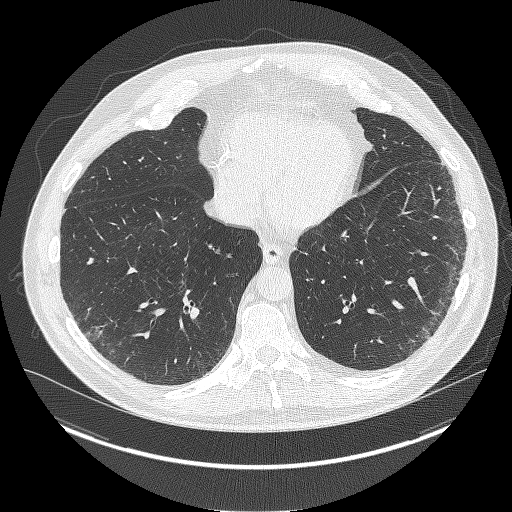
[im 89/310  lung]
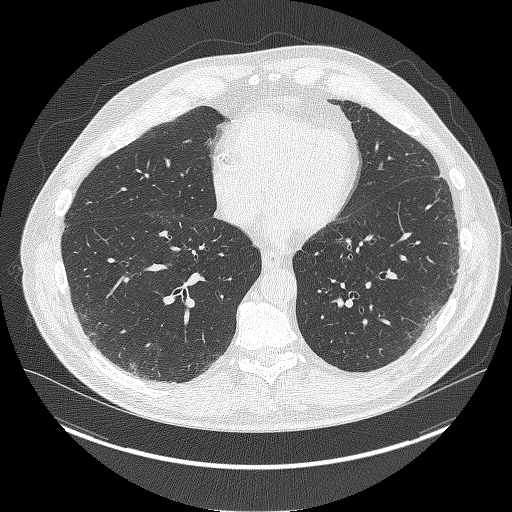
[im 118/310  mediastinal]
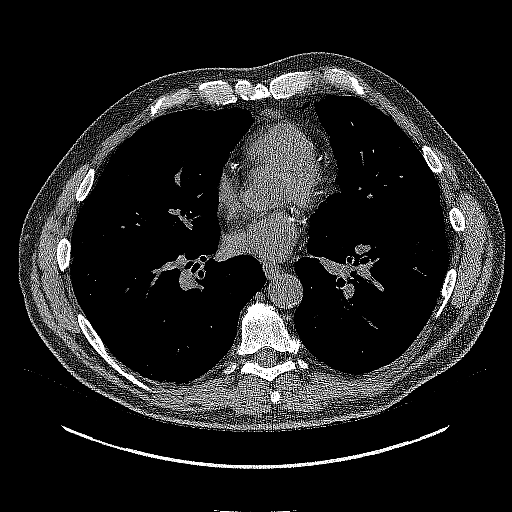
[im 118/310  lung]
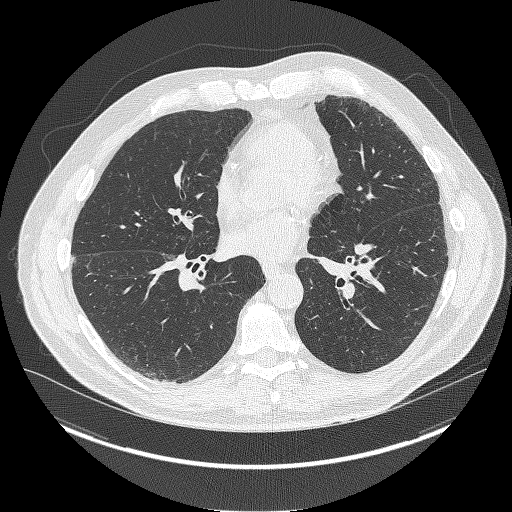
[im 148/310  lung]
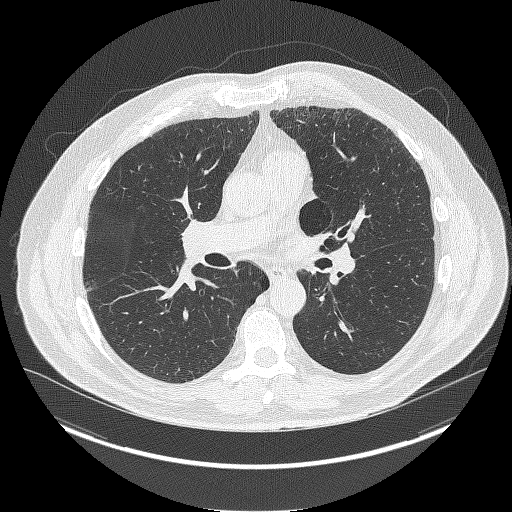
[im 162/310  lung]
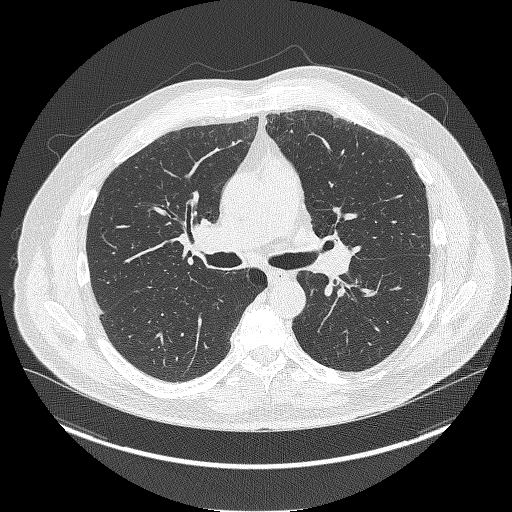
[im 192/310  lung]
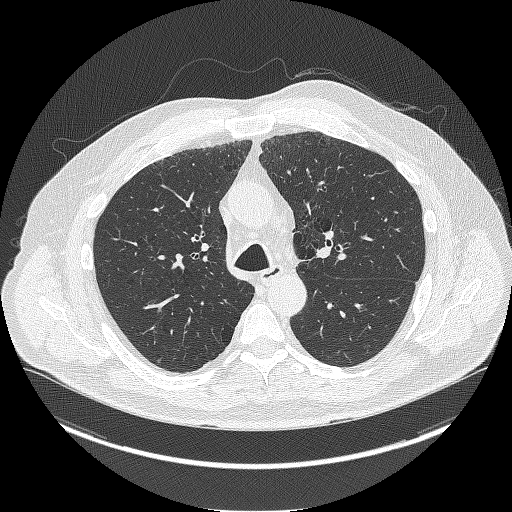
[im 221/310  mediastinal]
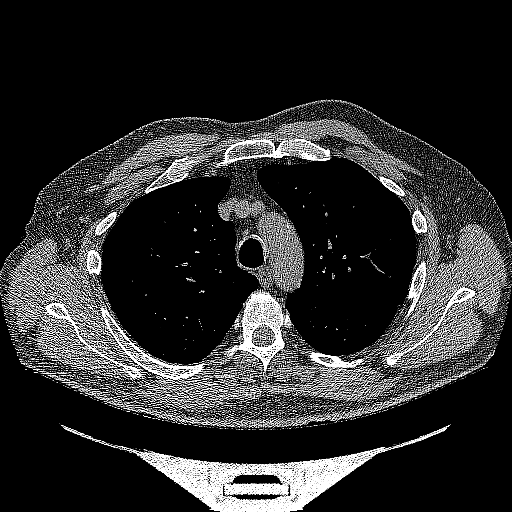
[im 221/310  lung]
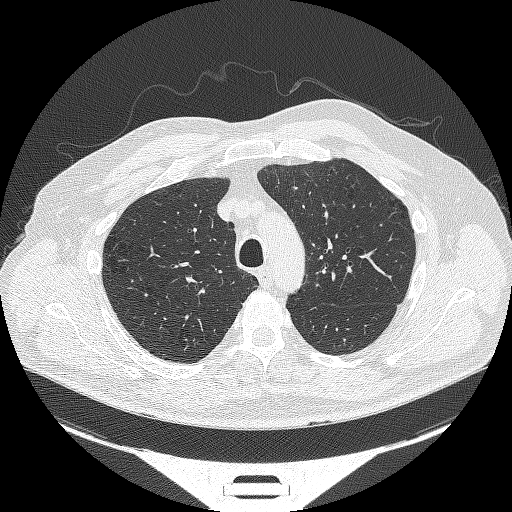
[im 236/310  lung]
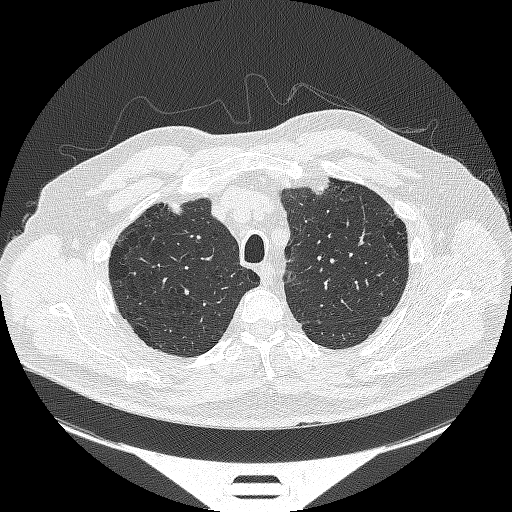
[im 265/310  lung]
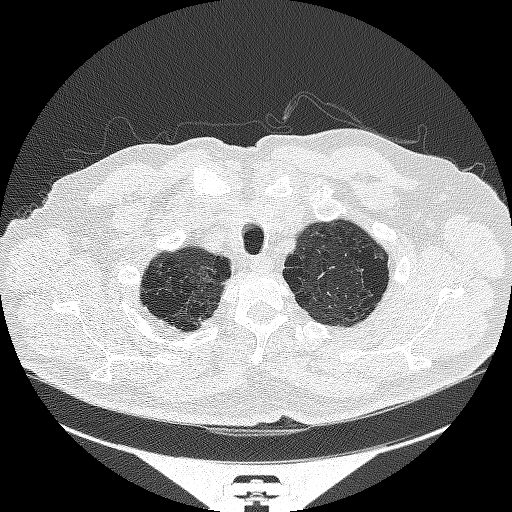
[im 295/310  lung]
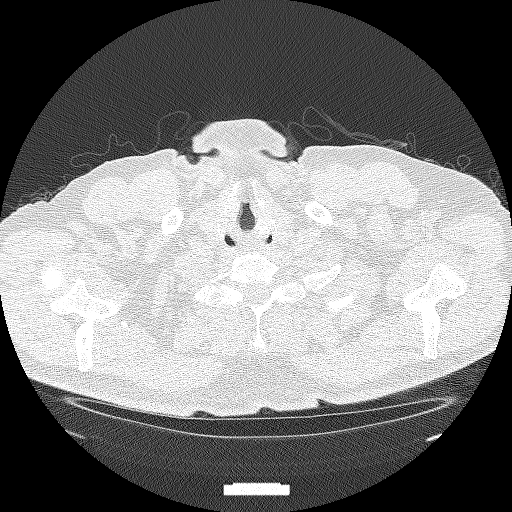

[Series 5: lung 1.00 br44 cor · coronal · 0.66mm/px · 3 of 317 slices shown]
[im 64/317  lung]
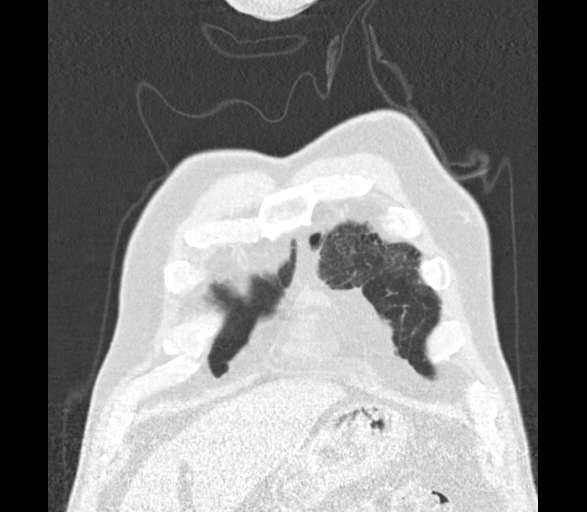
[im 127/317  lung]
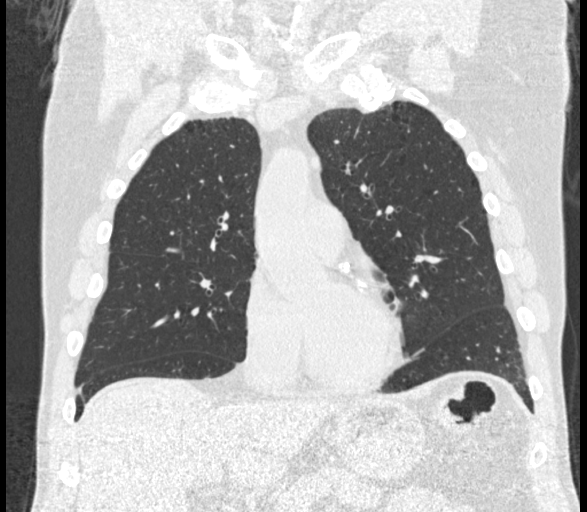
[im 190/317  lung]
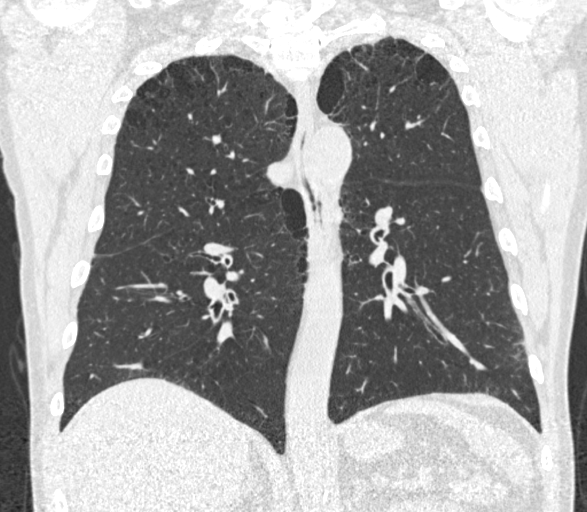

[15 of 40 positions shown; findings below may reference images not displayed]

FINDINGS: Cardiovascular: The heart size is normal. No substantial pericardial
effusion. Coronary artery calcification is evident. Atherosclerotic
calcification is noted in the wall of the thoracic aorta.

Mediastinum/Nodes: No mediastinal lymphadenopathy. No evidence for
gross hilar lymphadenopathy although assessment is limited by the
lack of intravenous contrast on today's study. The esophagus has
normal imaging features. There is no axillary lymphadenopathy.

Lungs/Pleura: Centrilobular and paraseptal emphysema noted
bilaterally. Biapical pleuroparenchymal scarring is similar. No
suspicious pulmonary nodule or mass. No pleural effusion. Impression

Upper Abdomen: Unremarkable.

Musculoskeletal: No worrisome lytic or sclerotic osseous
abnormality. The central tracheobronchial airways are patent.
IMPRESSION: 1. Lung-RADS 1, negative. Continue annual screening with low-dose
chest CT without contrast in 12 months.
2.  Emphysema. ([id number]-UK0.Z)
3.  Aortic Atherosclerois (MEKSL-170.0)

## 2018-12-10 ENCOUNTER — Other Ambulatory Visit: Payer: Self-pay | Admitting: Internal Medicine

## 2018-12-11 ENCOUNTER — Other Ambulatory Visit: Payer: Self-pay | Admitting: Physician Assistant

## 2019-01-01 NOTE — Progress Notes (Deleted)
Complete Physical  Assessment and Plan:   Encounter for general adult medical examination with abnormal findings  Essential hypertension Continue medications Monitor blood pressure at home; call if consistently over 130/80 Continue DASH diet.   Reminder to go to the ER if any CP, SOB, nausea, dizziness, severe HA, changes vision/speech, left arm numbness and tingling and jaw pain.  Coronary artery disease involving native heart with angina pectoris, unspecified vessel or lesion type ([place]) Control blood pressure, cholesterol, glucose, increase exercise.  Continue cardio follow up Stop smoking  OSA and COPD overlap syndrome (HCC) Wears mouthpiece, followed by Dr. Toy Cookey  Chronic obstructive pulmonary disease, unspecified COPD type ([place]) Continue inhalers, stop smoking, will get annual screening CT due to hx  Type 2 diabetes mellitus with other circulatory complication, without long-term current use of insulin [facility]) Education: Reviewed 'ABCs' of diabetes management (respective goals in parentheses):  A1C (<7), blood pressure (<130/80), and cholesterol (LDL <70) Eye Exam yearly and Dental Exam every 6 months - referred to ophthalmology Dietary recommendations Physical Activity recommendations Foot exam completed - normal  Benign prostatic hyperplasia with urinary frequency Currently awakening q2 hours at night; on doxazosin only at this time Discussed adding second agent - patient agreeable to add proscar daily after discussion of risks and benefits, with plan to attempt taper off doxazosin in 12 months if he has perceived improvement of urinary symptoms.   Vitamin D deficiency Continue supplementation Check vitamin D level  Smoking Quit last year, doing well.  Strategies for success discussed.  CT ***  RLS (restless legs syndrome) Has been declining medications, increase daytime activity  Mixed hyperlipidemia Continue medications Continue low cholesterol diet and  exercise.  Check lipid panel.   Medication management CBC, CMP/GFR, UA, magnesium  Elevated PSA Check PSA  Overweight (BMI 25.0-29.9) Long discussion about weight loss, diet, and exercise Recommended diet heavy in fruits and veggies and low in animal meats, cheeses, and dairy products, appropriate calorie intake Discussed appropriate weight for height  Follow up at next visit  Aortic atherosclerosis ([place]) Control blood pressure, cholesterol, glucose, increase exercise.     Discussed med's effects and SE's. Screening labs and tests as requested with regular follow-up as recommended. Over 40 minutes of exam, counseling, chart review and critical decision making was performed  Future Appointments  Date Time Provider [place]  01/02/2019  9:00 AM [name], NP [facility] None  01/08/2020  9:00 AM [name], NP [facility] None     HPI 68 y.o. male patient presents for a complete physical. He has Hyperlipidemia associated with type 2 diabetes mellitus ([place]); CAD (coronary artery disease); Hypertension; OSA and COPD overlap syndrome ([place]); Smoking; Vitamin D deficiency; Medication management; Type 2 diabetes mellitus (HCC); RLS (restless legs syndrome); COPD (chronic obstructive pulmonary disease) (HCC); BPH (benign prostatic hyperplasia); Elevated PSA; Acute medial meniscal tear, right, initial encounter; Acute medial meniscal tear, left, initial encounter; Chondromalacia, left knee; Chondromalacia, right knee; Overweight (BMI 25.0-29.9); and Aortic atherosclerosis (HCC) on their problem list.  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   He has OSA and COPD overlap syndrome ***  Recently quit smoking?  Has been undergoing annual low dose CT lung for CA screening *** due 01/2019  BMI is There is no height or weight on file to calculate BMI., he has been working on diet and exercise. Wt  Readings from Last 3 Encounters:  07/25/18 192 lb 9.6 oz (87.4  kg)  04/11/18 194 lb (88 kg)  02/04/18 194 lb (88 kg)   He has a history of CAD s/p PCI to RCA in2005. He underwent LHC in2006 after nuc showed inf ischemia which showedpatent RCA stent, D1 80%, and LAD 60%. Medical therapy was recommended. He underwent LHC in 04/18/11 which showed a proximal LAD 60% (unchanged from previous catheterization), distal LAD 65% (small caliber), Dx 60%, proximal CFX 20%, mid CFX 50%, OM1 30%, proximal RCA 20%, mid RCA stent patent, EF 65%. He had benign perfusion study in 11/2016. Followed by Dr. Johnsie Cancel.   His blood pressure has been controlled at home, today their BP is   He does not workout but works as a welder/physcally intense job. He denies chest pain, shortness of breath, dizziness.   He is on cholesterol medication and denies myalgias. His cholesterol is at goal. The cholesterol last visit was:   Lab Results  Component Value Date   CHOL 144 07/25/2018   HDL 35 (L) 07/25/2018   LDLCALC 80 07/25/2018   TRIG 196 (H) 07/25/2018   CHOLHDL 4.1 07/25/2018   He has been working on diet and exercise for diabetes (metformin ***, farxiga 10 mg daily ***, he is on bASA, he is on ACE/ARB, he is on statin and denies foot ulcerations, increased appetite, nausea, paresthesia of the feet, polydipsia, polyuria, visual disturbances, vomiting and weight loss. He does check fasting sugars ranging 120-150. Last A1C in the office was:  Lab Results  Component Value Date   HGBA1C 8.2 (H) 07/25/2018   Last GFR: Lab Results  Component Value Date   GFRNONAA 98 07/25/2018    Patient is on Vitamin D supplement.   Lab Results  Component Value Date   VD25OH 43 01/01/2018     He has dx of BPH, reports ***, takes finasteride, doxazosin ***  Last PSA was: Lab Results  Component Value Date   PSA 3.8 01/01/2018   PSA 2.9 11/21/2016   PSA 3.57 11/18/2015     Current Medications:  Current Outpatient  Medications on File Prior to Visit  Medication Sig Dispense Refill  . albuterol (VENTOLIN HFA) 108 (90 Base) MCG/ACT inhaler Inhale 2 puffs into the lungs every 4 (four) hours as needed for wheezing or shortness of breath. 1 Inhaler 0  . aspirin EC 81 MG tablet Take 1 tablet (81 mg total) by mouth daily. 90 tablet 3  . blood glucose meter kit and supplies Dispense based on patient and insurance preference. Use up to four times daily as directed. (FOR ICD-9 250.00, 250.01). 1 each 0  . BREO ELLIPTA 100-25 MCG/INH AEPB INHALE 1 PUFF BY MOUTH ONCE DAILY. RINSE MOUTH WITH WATER AFTER EACH USE. 60 each 0  . cetirizine (ZYRTEC) 10 MG tablet Take 10 mg by mouth as needed.     . Cholecalciferol (VITAMIN D PO) Take 10,000 mg by mouth daily.     . dapagliflozin propanediol (FARXIGA) 10 MG TABS tablet Take 10 mg by mouth daily. 90 tablet 1  . doxazosin (CARDURA) 8 MG tablet Take 1 tablet (8 mg total) by mouth daily. 90 tablet 0  . finasteride (PROSCAR) 5 MG tablet Take 1 tablet (5 mg total) by mouth daily. 90 tablet 1  . glucose blood test strip Test blood sugars once daily.Dx: E11.9 100 each 2  . metFORMIN (GLUCOPHAGE) 1000 MG tablet Take 1 tablet (1,000 mg total) by mouth 2 (two) times daily. 180 tablet 1  . promethazine-dextromethorphan (PROMETHAZINE-DM) 6.25-15 MG/5ML syrup Take 5  mLs by mouth 4 (four) times daily as needed for cough. 240 mL 1  . ramipril (ALTACE) 5 MG capsule Take 1 capsule by mouth once daily 90 capsule 0  . rosuvastatin (CRESTOR) 20 MG tablet TAKE 1 TABLET BY MOUTH ONCE DAILY 90 tablet 1   No current facility-administered medications on file prior to visit.    Allergies:  No Known Allergies Health Maintenance:  Immunization History  Administered Date(s) Administered  . Influenza, High Dose Seasonal PF 06/22/2017  . Influenza-Unspecified 06/02/2013, 08/18/2014  . Pneumococcal Conjugate-13 11/21/2016  . Pneumococcal Polysaccharide-23 08/27/2007, 01/01/2018  . Td 01/01/2018  .  Tdap 08/27/2007  . Zoster 10/20/2013   Tetanus: 2019 Pneumovax: 2009, 2019 Prevnar 13: 2018 Flu vaccine: 2018 *** Zostavax: 2015  Stress test: 11/2017 Carotid US: 2012 CXR: 2017 Lung CT: 01/2018 ***   DEXA: - Colonoscopy: 2013 DUE - declines at this time ***, will call insurance EGD: -  Eye Exam:  Last 2015 - referring to ophth Dentist: Several years, has appointment in July  Patient Care Team: [name], MD as PCP - General (Internal Medicine) Josue Hector, MD as Consulting Physician (Cardiology) Ladene Artist, MD as Consulting Physician (Gastroenterology)  Medical History:  has Hyperlipidemia associated with type 2 diabetes mellitus [facility]); CAD (coronary artery disease); Hypertension; OSA and COPD overlap syndrome ([place]); Smoking; Vitamin D deficiency; Medication management; Type 2 diabetes mellitus (HCC); RLS (restless legs syndrome); COPD (chronic obstructive pulmonary disease) (HCC); BPH (benign prostatic hyperplasia); Elevated PSA; Acute medial meniscal tear, right, initial encounter; Acute medial meniscal tear, left, initial encounter; Chondromalacia, left knee; Chondromalacia, right knee; Overweight (BMI 25.0-29.9); and Aortic atherosclerosis (HCC) on their problem list. Surgical History:  He  has a past surgical history that includes heart stent (2005); Colonoscopy (11/2011); Polypectomy; Cardiac catheterization (2005); Inguinal hernia repair (Right, 11/16/2014); Insertion of mesh (Right, 11/16/2014); Knee arthroscopy with medial menisectomy (Bilateral, 06/25/2017); and Chondroplasty (06/25/2017). Family History:  His family history includes Cancer in his mother; Hypertension in his father; Kidney cancer in his maternal grandmother; Prostate cancer in his father. Social History:   reports that he quit smoking about a year ago. His smoking use included cigarettes. He has a 40.00 pack-year smoking history. He has never used smokeless tobacco. He reports current alcohol  use. He reports that he does not use drugs. Review of Systems:  Review of Systems  Constitutional: Negative for malaise/fatigue and weight loss.  HENT: Negative for hearing loss and tinnitus.   Eyes: Negative for blurred vision and double vision.  Respiratory: Negative for cough, sputum production, shortness of breath and wheezing.   Cardiovascular: Negative for chest pain, palpitations, orthopnea, claudication, leg swelling and PND.  Gastrointestinal: Negative for abdominal pain, blood in stool, constipation, diarrhea, heartburn, melena, nausea and vomiting.  Genitourinary: Negative.   Musculoskeletal: Negative for falls, joint pain and myalgias.  Skin: Negative for rash.  Neurological: Negative for dizziness, tingling, sensory change, weakness and headaches.  Endo/Heme/Allergies: Negative for polydipsia.  Psychiatric/Behavioral: Negative.  Negative for depression, memory loss, substance abuse and suicidal ideas. The patient is not nervous/anxious and does not have insomnia.   All other systems reviewed and are negative.   Physical Exam: Estimated body mass index is 27.64 kg/m as calculated from the following:   Height as of 07/25/18: 5' 10"  (1.778 m).   Weight as of 07/25/18: 192 lb 9.6 oz (87.4 kg). There were no vitals taken for this visit. General Appearance: Well nourished, in no apparent distress.  Eyes: PERRLA, EOMs,  conjunctiva no swelling or erythema, normal fundi and vessels.  Sinuses: No Frontal/maxillary tenderness  ENT/Mouth: Ext aud canals clear, normal light reflex with TMs without erythema, bulging. Good dentition. No erythema, swelling, or exudate on post pharynx. Tonsils not swollen or erythematous. Hearing normal.  Neck: Supple, thyroid normal. No bruits  Respiratory: Respiratory effort normal, BS equal bilaterally without rales, rhonchi, wheezing or stridor.  Cardio: RRR without murmurs, rubs or gallops. Brisk peripheral pulses without edema.  Chest: symmetric,  with normal excursions and percussion.  Abdomen: Soft, nontender, no guarding, rebound, hernias, masses, or organomegaly.  Lymphatics: Non tender without lymphadenopathy.  Genitourinary:  Musculoskeletal: Full ROM all peripheral extremities,5/5 strength, and normal gait.  Skin: Warm, dry without rashes, lesions, ecchymosis. Neuro: Cranial nerves intact, reflexes equal bilaterally. Normal muscle tone, no cerebellar symptoms. Sensation intact.  Psych: Awake and oriented X 3, normal affect, Insight and Judgment appropriate.   EKG: RBBB - no significant changes from previous  Izora Ribas 8:13 AM [facility]

## 2019-01-02 ENCOUNTER — Encounter: Payer: Self-pay | Admitting: Adult Health

## 2019-01-15 ENCOUNTER — Encounter (HOSPITAL_COMMUNITY): Payer: Self-pay | Admitting: Emergency Medicine

## 2019-01-15 ENCOUNTER — Emergency Department (HOSPITAL_COMMUNITY): Payer: BC Managed Care – PPO

## 2019-01-15 ENCOUNTER — Other Ambulatory Visit: Payer: Self-pay

## 2019-01-15 ENCOUNTER — Inpatient Hospital Stay (HOSPITAL_COMMUNITY)
Admission: EM | Admit: 2019-01-15 | Discharge: 2019-01-18 | DRG: 563 | Disposition: A | Discharge status: Home Health | Payer: BC Managed Care – PPO | Attending: General Surgery | Admitting: General Surgery

## 2019-01-15 DIAGNOSIS — Z79899 Other long term (current) drug therapy: Secondary | ICD-10-CM | POA: Diagnosis not present

## 2019-01-15 DIAGNOSIS — S92002A Unspecified fracture of left calcaneus, initial encounter for closed fracture: Secondary | ICD-10-CM | POA: Diagnosis present

## 2019-01-15 DIAGNOSIS — Z1159 Encounter for screening for other viral diseases: Secondary | ICD-10-CM | POA: Diagnosis not present

## 2019-01-15 DIAGNOSIS — Z7984 Long term (current) use of oral hypoglycemic drugs: Secondary | ICD-10-CM | POA: Diagnosis not present

## 2019-01-15 DIAGNOSIS — S92035A Nondisplaced avulsion fracture of tuberosity of left calcaneus, initial encounter for closed fracture: Secondary | ICD-10-CM

## 2019-01-15 DIAGNOSIS — G2581 Restless legs syndrome: Secondary | ICD-10-CM | POA: Diagnosis present

## 2019-01-15 DIAGNOSIS — S82892A Other fracture of left lower leg, initial encounter for closed fracture: Secondary | ICD-10-CM

## 2019-01-15 DIAGNOSIS — G4733 Obstructive sleep apnea (adult) (pediatric): Secondary | ICD-10-CM | POA: Diagnosis present

## 2019-01-15 DIAGNOSIS — S8252XA Displaced fracture of medial malleolus of left tibia, initial encounter for closed fracture: Principal | ICD-10-CM | POA: Diagnosis present

## 2019-01-15 DIAGNOSIS — I2510 Atherosclerotic heart disease of native coronary artery without angina pectoris: Secondary | ICD-10-CM | POA: Diagnosis not present

## 2019-01-15 DIAGNOSIS — W11XXXA Fall on and from ladder, initial encounter: Secondary | ICD-10-CM | POA: Diagnosis present

## 2019-01-15 DIAGNOSIS — S2242XA Multiple fractures of ribs, left side, initial encounter for closed fracture: Secondary | ICD-10-CM

## 2019-01-15 DIAGNOSIS — Z8249 Family history of ischemic heart disease and other diseases of the circulatory system: Secondary | ICD-10-CM | POA: Diagnosis not present

## 2019-01-15 DIAGNOSIS — Y9389 Activity, other specified: Secondary | ICD-10-CM | POA: Diagnosis not present

## 2019-01-15 DIAGNOSIS — Z7982 Long term (current) use of aspirin: Secondary | ICD-10-CM | POA: Diagnosis not present

## 2019-01-15 DIAGNOSIS — E785 Hyperlipidemia, unspecified: Secondary | ICD-10-CM | POA: Diagnosis present

## 2019-01-15 DIAGNOSIS — S8255XA Nondisplaced fracture of medial malleolus of left tibia, initial encounter for closed fracture: Secondary | ICD-10-CM | POA: Diagnosis not present

## 2019-01-15 DIAGNOSIS — M25572 Pain in left ankle and joints of left foot: Secondary | ICD-10-CM | POA: Diagnosis present

## 2019-01-15 DIAGNOSIS — E119 Type 2 diabetes mellitus without complications: Secondary | ICD-10-CM | POA: Diagnosis present

## 2019-01-15 DIAGNOSIS — Z955 Presence of coronary angioplasty implant and graft: Secondary | ICD-10-CM | POA: Diagnosis not present

## 2019-01-15 DIAGNOSIS — Z87891 Personal history of nicotine dependence: Secondary | ICD-10-CM | POA: Diagnosis not present

## 2019-01-15 DIAGNOSIS — Z03818 Encounter for observation for suspected exposure to other biological agents ruled out: Secondary | ICD-10-CM | POA: Diagnosis not present

## 2019-01-15 DIAGNOSIS — K219 Gastro-esophageal reflux disease without esophagitis: Secondary | ICD-10-CM | POA: Diagnosis present

## 2019-01-15 DIAGNOSIS — W19XXXA Unspecified fall, initial encounter: Secondary | ICD-10-CM

## 2019-01-15 DIAGNOSIS — J449 Chronic obstructive pulmonary disease, unspecified: Secondary | ICD-10-CM | POA: Diagnosis present

## 2019-01-15 DIAGNOSIS — R9431 Abnormal electrocardiogram [ECG] [EKG]: Secondary | ICD-10-CM | POA: Diagnosis not present

## 2019-01-15 DIAGNOSIS — S2239XA Fracture of one rib, unspecified side, initial encounter for closed fracture: Secondary | ICD-10-CM | POA: Diagnosis present

## 2019-01-15 DIAGNOSIS — I10 Essential (primary) hypertension: Secondary | ICD-10-CM | POA: Diagnosis present

## 2019-01-15 DIAGNOSIS — S92032A Displaced avulsion fracture of tuberosity of left calcaneus, initial encounter for closed fracture: Secondary | ICD-10-CM | POA: Diagnosis not present

## 2019-01-15 LAB — CBC WITH DIFFERENTIAL/PLATELET
Abs Immature Granulocytes: 0.05 10*3/uL (ref 0.00–0.07)
Basophils Absolute: 0.1 10*3/uL (ref 0.0–0.1)
Basophils Relative: 0 %
Eosinophils Absolute: 0.2 10*3/uL (ref 0.0–0.5)
Eosinophils Relative: 2 %
HCT: 44.6 % (ref 39.0–52.0)
Hemoglobin: 14.9 g/dL (ref 13.0–17.0)
Immature Granulocytes: 0 %
Lymphocytes Relative: 9 %
Lymphs Abs: 1.3 10*3/uL (ref 0.7–4.0)
MCH: 31.7 pg (ref 26.0–34.0)
MCHC: 33.4 g/dL (ref 30.0–36.0)
MCV: 94.9 fL (ref 80.0–100.0)
Monocytes Absolute: 0.8 10*3/uL (ref 0.1–1.0)
Monocytes Relative: 6 %
Neutro Abs: 11.7 10*3/uL — ABNORMAL HIGH (ref 1.7–7.7)
Neutrophils Relative %: 83 %
Platelets: 169 10*3/uL (ref 150–400)
RBC: 4.7 MIL/uL (ref 4.22–5.81)
RDW: 12.6 % (ref 11.5–15.5)
WBC: 14.1 10*3/uL — ABNORMAL HIGH (ref 4.0–10.5)
nRBC: 0 % (ref 0.0–0.2)

## 2019-01-15 LAB — BASIC METABOLIC PANEL
Anion gap: 8 (ref 5–15)
BUN: 11 mg/dL (ref 8–23)
CO2: 24 mmol/L (ref 22–32)
Calcium: 9.3 mg/dL (ref 8.9–10.3)
Chloride: 104 mmol/L (ref 98–111)
Creatinine, Ser: 0.73 mg/dL (ref 0.61–1.24)
GFR calc Af Amer: >60 mL/min (ref >60)
GFR calc non Af Amer: >60 mL/min (ref >60)
Glucose, Bld: 260 mg/dL — ABNORMAL HIGH (ref 70–99)
Potassium: 4.3 mmol/L (ref 3.5–5.1)
Sodium: 136 mmol/L (ref 135–145)

## 2019-01-15 LAB — SARS CORONAVIRUS 2 BY RT PCR (HOSPITAL ORDER, PERFORMED IN ~~LOC~~ HOSPITAL LAB): SARS Coronavirus 2: NEGATIVE

## 2019-01-15 MED ORDER — MORPHINE SULFATE (PF) 4 MG/ML IV SOLN
4.0000 mg | Freq: Once | INTRAVENOUS | Status: AC
  Administered 2019-01-15: 18:00:00 4 mg via INTRAVENOUS
  Filled 2019-01-15: qty 1

## 2019-01-15 MED ORDER — ONDANSETRON HCL 4 MG/2ML IJ SOLN
4.0000 mg | Freq: Four times a day (QID) | INTRAMUSCULAR | Status: DC | PRN
  Administered 2019-01-15: 4 mg via INTRAVENOUS
  Filled 2019-01-15: qty 2

## 2019-01-15 MED ORDER — SODIUM CHLORIDE 0.9 % IV SOLN
INTRAVENOUS | Status: DC
  Administered 2019-01-15 – 2019-01-16 (×2): via INTRAVENOUS

## 2019-01-15 MED ORDER — MORPHINE SULFATE (PF) 4 MG/ML IV SOLN
4.0000 mg | Freq: Once | INTRAVENOUS | Status: AC
  Administered 2019-01-15: 17:00:00 4 mg via INTRAVENOUS
  Filled 2019-01-15: qty 1

## 2019-01-15 MED ORDER — MORPHINE SULFATE (PF) 4 MG/ML IV SOLN
4.0000 mg | INTRAVENOUS | Status: DC | PRN
  Administered 2019-01-15 – 2019-01-16 (×4): 4 mg via INTRAVENOUS
  Filled 2019-01-15 (×4): qty 1

## 2019-01-15 NOTE — ED Notes (Signed)
Placed patient on 2 L of O2 via [place] because patient's SpO2 dropped to 90% on RA.

## 2019-01-15 NOTE — ED Triage Notes (Signed)
Pt reports he was working on [employer] when he fell on ladder falling about 6 feet on left side. Pt c/o left ankle and left rib cage pains. Denies LOC or taking blood thinners.

## 2019-01-15 NOTE — ED Provider Notes (Signed)
[facility] DEPT Provider Note   CSN: [id number] Arrival date & time: 01/15/19  1550    History   Chief Complaint Chief Complaint  Patient presents with  . Fall  . Ankle Pain  . Chest Pain    left    HPI Joe Green is a 68 y.o. male.     HPI Patient presents after a fall. He notes that he was on a ladder, when he fell, landing on top of the ladder. No head trauma, no loss of consciousness. Since the event, however, he has had sharp, severe pain in his left rib cage and left ankle. Attempts at ambulating have been unsuccessful. No medication taken for relief. No distal loss of sensation or weakness in the ankle Patient is not actually short of breath, but notes pain with inspiration, throughout the left axilla. Past Medical History:  Diagnosis Date  . CAD (coronary artery disease)    a. s/p stent to RCA 2005; b. LHC 2006 after Nuc with inf ischemia: patent RCA stent, D1 80%, LAD 60% => med Rx;  c. LHC 04/18/11: Proximal LAD 60% (unchanged from previous catheterization), distal LAD 65% (small caliber), Dx 60%, proximal CFX 20%, mid CFX 50%, OM1 30%, proximal RCA 20%, mid RCA stent patent, EF 65%.=> med Rx  . GERD (gastroesophageal reflux disease)   . HLD (hyperlipidemia)   . Hypertension   . Obesity   . OSA (obstructive sleep apnea)    has a cpap-does not use-has a special mouthpiece he wears at night  . Type II or unspecified type diabetes mellitus without mention of complication, not stated as uncontrolled   . Vitamin D deficiency   . Wears glasses     Patient Active Problem List   Diagnosis Date Noted  . Overweight (BMI 25.0-29.9) 12/30/2017  . Aortic atherosclerosis ([place]) 12/30/2017  . Acute medial meniscal tear, right, initial encounter 06/25/2017  . Acute medial meniscal tear, left, initial encounter 06/25/2017  . 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 Chondromalacia, right knee 06/25/2017  . Elevated PSA 11/18/2015  . RLS  (restless legs syndrome) 10/05/2014  . COPD (chronic obstructive pulmonary disease) ([place]) 10/05/2014  . BPH (benign prostatic hyperplasia) 10/05/2014  . Type 2 diabetes mellitus ([place]) 06/05/2014  . Vitamin D deficiency 04/01/2014  . Medication management 04/01/2014  . Smoking 07/21/2013  . Hypertension   . OSA and COPD overlap syndrome ([place])   . Hyperlipidemia associated with type 2 diabetes mellitus ([place]) 04/03/2011  . CAD (coronary artery disease) 04/03/2011    Past Surgical History:  Procedure Laterality Date  . CARDIAC CATHETERIZATION  2005   stent  . CHONDROPLASTY  06/25/2017   Procedure: CHONDROPLASTY;  Surgeon: Dorna Leitz, MD;  Location: [facility];  Service: Orthopedics;;  . COLONOSCOPY  11/2011   Due 5 years Dr. Fuller Plan  . heart stent  2005   RCA  . INGUINAL HERNIA REPAIR Right 11/16/2014   Procedure: RIGHT INGUINAL HERNIA REPAIR WITH MESH;  Surgeon: Armandina Gemma, MD;  Location: [facility];  Service: General;  Laterality: Right;  . INSERTION OF MESH Right 11/16/2014   Procedure: INSERTION OF MESH;  Surgeon: Armandina Gemma, MD;  Location: [facility];  Service: General;  Laterality: Right;  . KNEE ARTHROSCOPY WITH MEDIAL MENISECTOMY Bilateral 06/25/2017   Procedure: right knee arthroscopy with medial meniscectomy, chondroplasty, Left knee arthroscopy with medial menisectomy removal of plica and chondroplasty;  Surgeon: Dorna Leitz, MD;  Location: [facility];  Service: Orthopedics;  Laterality: Bilateral;  . POLYPECTOMY          Home Medications    Prior to Admission medications   Medication Sig Start Date End Date Taking? Authorizing Provider  aspirin EC 81 MG tablet Take 1 tablet (81 mg total) by mouth daily. 11/17/16  Yes Angelena Form R, PA-C  BREO ELLIPTA 100-25 MCG/INH AEPB INHALE 1 PUFF BY MOUTH ONCE DAILY. RINSE MOUTH WITH WATER AFTER EACH USE. Patient taking differently: Inhale 1 puff into the lungs daily.  Rinse mouth with water after each use 12/11/18  Yes Corbett, Caryl Pina, NP  cetirizine (ZYRTEC) 10 MG tablet Take 10 mg by mouth as needed for allergies.    Yes [provider]  Cholecalciferol (VITAMIN D PO) Take 10,000 mg by mouth daily.    Yes [provider]  dapagliflozin propanediol (FARXIGA) 10 MG TABS tablet Take 10 mg by mouth daily. 09/28/17  Yes Vicie Mutters, PA-C  doxazosin (CARDURA) 8 MG tablet Take 1 tablet (8 mg total) by mouth daily. 05/17/18  Yes Vicie Mutters, PA-C  fexofenadine (ALLEGRA) 60 MG tablet Take 60 mg by mouth daily as needed for allergies or rhinitis.   Yes [provider]  finasteride (PROSCAR) 5 MG tablet Take 1 tablet (5 mg total) by mouth daily. 01/01/18  Yes [name], NP  metFORMIN (GLUCOPHAGE) 1000 MG tablet Take 1 tablet (1,000 mg total) by mouth 2 (two) times daily. 07/25/18  Yes Vicie Mutters, PA-C  ramipril (ALTACE) 5 MG capsule Take 1 capsule by mouth once daily 12/10/18  Yes [name], MD  rosuvastatin (CRESTOR) 20 MG tablet TAKE 1 TABLET BY MOUTH ONCE DAILY Patient taking differently: Take 20 mg by mouth daily.  07/07/18  Yes [name], MD  albuterol (VENTOLIN HFA) 108 (90 Base) MCG/ACT inhaler Inhale 2 puffs into the lungs every 4 (four) hours as needed for wheezing or shortness of breath. Patient not taking: Reported on 01/15/2019 07/25/18   Vicie Mutters, PA-C  blood glucose meter kit and supplies Dispense based on patient and insurance preference. Use up to four times daily as directed. (FOR ICD-9 250.00, 250.01). 06/25/17   Vicie Mutters, PA-C  glucose blood test strip Test blood sugars once daily.Dx: E11.9 03/02/17   Vicie Mutters, PA-C  promethazine-dextromethorphan (PROMETHAZINE-DM) 6.25-15 MG/5ML syrup Take 5 mLs by mouth 4 (four) times daily as needed for cough. Patient not taking: Reported on 01/15/2019 07/25/18   Vicie Mutters, PA-C    Family History Family History  Problem Relation Age of  Onset  . Prostate cancer Father   . Hypertension Father   . Cancer Mother        vaginal?   . Kidney cancer Maternal Grandmother     Social History Social History   Tobacco Use  . Smoking status: Former Smoker    Packs/day: 1.00    Years: 40.00    Pack years: 40.00    Types: Cigarettes    Last attempt to quit: 12/24/2017    Years since quitting: 1.0  . Smokeless tobacco: Never Used  . Tobacco comment: sent in chantix  Substance Use Topics  . Alcohol use: Yes    Comment: 3-6 per night  . Drug use: No     Allergies   Patient has no known allergies.   Review of Systems Review of Systems  Constitutional:       Per HPI, otherwise negative  HENT:       Per HPI, otherwise negative  Respiratory:  Per HPI, otherwise negative  Cardiovascular:       Per HPI, otherwise negative  Gastrointestinal: Negative for vomiting.  Endocrine:       Negative aside from HPI  Genitourinary:       Neg aside from HPI   Musculoskeletal:       Per HPI, otherwise negative  Skin: Negative.   Neurological: Negative for syncope.     Physical Exam Updated Vital Signs BP 133/76 (BP Location: Right Arm)   Pulse 74   Temp 98.6 F (37 C) (Oral)   Resp 16   Ht 5' 10"  (1.778 m)   Wt 86.2 kg   SpO2 96%   BMI 27.26 kg/m   Physical Exam Vitals signs and nursing note reviewed.  Constitutional:      General: He is not in acute distress.    Appearance: He is well-developed.  HENT:     Head: Normocephalic and atraumatic.  Eyes:     Conjunctiva/sclera: Conjunctivae normal.  Cardiovascular:     Rate and Rhythm: Normal rate and regular rhythm.  Pulmonary:     Effort: Pulmonary effort is normal. No respiratory distress.     Breath sounds: No stridor. No decreased breath sounds.  Chest:    Abdominal:     General: There is no distension.  Musculoskeletal:     Left shoulder: Normal.     Left knee: Normal.     Left ankle: He exhibits decreased range of motion and swelling.        Arms:  Skin:    General: Skin is warm and dry.  Neurological:     Mental Status: He is alert and oriented to person, place, and time.      ED Treatments / Results  Labs (all labs ordered are listed, but only abnormal results are displayed) Labs Reviewed  BASIC METABOLIC PANEL - Abnormal; Notable for the following components:      Result Value   Glucose, Bld 260 (*)    All other components within normal limits  CBC WITH DIFFERENTIAL/PLATELET - Abnormal; Notable for the following components:   WBC 14.1 (*)    Neutro Abs 11.7 (*)    All other components within normal limits  SARS CORONAVIRUS 2 (HOSPITAL ORDER, [facility])    EKG EKG Interpretation  Date/Time:  Wednesday January 15 2019 16:15:18 EDT Ventricular Rate:  84 PR Interval:    QRS Duration: 148 QT Interval:  396 QTC Calculation: 469 R Axis:   170 Text Interpretation:  Sinus rhythm Right bundle branch block No significant change since last tracing Abnormal ECG Confirmed by Carmin Muskrat [phone]) on 01/15/2019 4:38:35 PM   Radiology Dg Ribs Unilateral W/chest Left  Result Date: 01/15/2019 CLINICAL DATA:  Pain status post fall EXAM: LEFT RIBS AND CHEST - 3+ VIEW COMPARISON:  Chest x-ray dated 11/18/2015. FINDINGS: The lung volumes are low. Heart size is mildly enlarged. There appear to be small bilateral pleural effusions. There are hazy bilateral airspace opacities. There is no pneumothorax. There are multiple minimally displaced fractures involving the sixth through eighth ribs posteriorly on the left. IMPRESSION: 1. Acute minimally displaced fracture involving the sixth through eighth ribs posteriorly on the left. 2. No pneumothorax. 3. Low lung volumes with small bilateral pleural effusions and hazy airspace opacities concerning for volume overload with developing pulmonary edema. An atypical infectious process can have a similar appearance. Electronically Signed   By: Constance Holster M.D.    On: 01/15/2019 19:10  Dg Ankle Complete Left  Result Date: 01/15/2019 CLINICAL DATA:  Pain EXAM: LEFT ANKLE COMPLETE - 3+ VIEW COMPARISON:  None. FINDINGS: There is an acute fracture of the medial malleolus with surrounding soft tissue swelling. There is an old healed fracture of the distal fibular diaphysis. There is a moderate-sized plantar calcaneal spur. There is an avulsion fracture fragment of the calcaneus visualized on the frontal view. IMPRESSION: 1. Acute nondisplaced a fracture involving the medial malleolus with surrounding soft tissue swelling. 2. Acute avulsion fracture of the calcaneus likely at the attachment of the extensor digitorum brevis muscle. This is best visualized on the frontal view only. Electronically Signed   By: Constance Holster M.D.   On: 01/15/2019 19:07   Ct Chest Wo Contrast  Result Date: 01/15/2019 CLINICAL DATA:  Rib fracture suspected status post trauma. EXAM: CT CHEST WITHOUT CONTRAST TECHNIQUE: Multidetector CT imaging of the chest was performed following the standard protocol without IV contrast. COMPARISON:  CT chest dated 12/15/2016. FINDINGS: Cardiovascular: Heart size is normal. There are coronary artery calcifications. There is no evidence of a thoracic aortic aneurysm. Atherosclerotic changes are noted of the thoracic aorta. Mediastinum/Nodes: No enlarged mediastinal or axillary lymph nodes. Thyroid gland, trachea, and esophagus demonstrate no significant findings. Lungs/Pleura: Emphysematous changes are noted at the lung apices. Bilateral airspace consolidation is visualized. Evaluation is limited by significant motion artifact. The trachea is unremarkable. No significant pleural effusion. Upper Abdomen: No acute abnormality. Musculoskeletal: Again identified are mildly displaced fractures involving the sixth through eighth ribs posteriorly on the left. IMPRESSION: 1. Acute minimally displaced fractures involving the sixth through eighth ribs posteriorly on the  left. There is no pneumothorax. 2. Symmetric bibasilar airspace opacities concerning for aspiration or atelectasis. 3. Emphysematous changes bilaterally. Aortic Atherosclerosis (ICD10-I70.0) and Emphysema (ICD10-J43.9). Electronically Signed   By: Constance Holster M.D.   On: 01/15/2019 21:10    Procedures Procedures (including critical care time)  Medications Ordered in ED Medications  0.9 %  sodium chloride infusion ( Intravenous New Bag/Given 01/15/19 1646)  ondansetron (ZOFRAN) injection 4 mg (4 mg Intravenous Given 01/15/19 1647)  morphine 4 MG/ML injection 4 mg (4 mg Intravenous Given 01/15/19 2056)  morphine 4 MG/ML injection 4 mg (4 mg Intravenous Given 01/15/19 1642)  morphine 4 MG/ML injection 4 mg (4 mg Intravenous Given 01/15/19 1828)     Initial Impression / Assessment and Plan / ED Course  I have reviewed the triage vital signs and the nursing notes.  Pertinent labs & imaging results that were available during my care of the patient were reviewed by me and considered in my medical decision making (see chart for details).    On repeat exam the patient continues to complain of severe pain in the left upper chest. Relief with morphine was transient.  Update:, X-rays consistent with fracture of the left foot, ankle.  I discussed this with our orthopedic colleague, the patient will be placed in a cam walker.  Update: Patient tolerated placement of CAM Walker appropriately, continues to have chest pain, and with concern for rib fractures, intrathoracic trauma given the abnormal x-ray, CT scan will be performed.  I discussed the patient's CT, presentation with our surgery/trauma colleague given the patient's need for pain control, with multiple rib fractures following a fall.     10:37 PM Patient is hemodynamically unremarkable. This elderly male presents after a fall from a ladder. Patient is found to have a fracture of left ankle, left foot, and multiple rib fractures. No  pneumothorax, but patient has persistent pain, requiring frequent dosing of narcotic pain medication, and given concern for multiple rib fractures in an elderly male, risk for pulmonary contusion, need for pain control, the patient will be admitted for further monitoring, management.  Final Clinical Impressions(s) / ED Diagnoses   Final diagnoses:  Fall, initial encounter  Closed fracture of multiple ribs of left side, initial encounter  Closed fracture of left ankle, initial encounter  Closed nondisplaced avulsion fracture of tuberosity of left calcaneus, initial encounter     Carmin Muskrat, MD 01/15/19 2241

## 2019-01-15 NOTE — H&P (Signed)
Joe Green is an 68 y.o. male.   Chief Complaint: fall HPI:  The patient is a 68 year old white male who fell 6 feet from a ladder while installing a gazebo. No loc. No hypotension. Complains of left ankle and rib pain  Past Medical History:  Diagnosis Date  . CAD (coronary artery disease)    a. s/p stent to RCA 2005; b. LHC 2006 after Nuc with inf ischemia: patent RCA stent, D1 80%, LAD 60% => med Rx;  c. LHC 04/18/11: Proximal LAD 60% (unchanged from previous catheterization), distal LAD 65% (small caliber), Dx 60%, proximal CFX 20%, mid CFX 50%, OM1 30%, proximal RCA 20%, mid RCA stent patent, EF 65%.=> med Rx  . GERD (gastroesophageal reflux disease)   . HLD (hyperlipidemia)   . Hypertension   . Obesity   . OSA (obstructive sleep apnea)    has a cpap-does not use-has a special mouthpiece he wears at night  . Type II or unspecified type diabetes mellitus without mention of complication, not stated as uncontrolled   . Vitamin D deficiency   . Wears glasses     Past Surgical History:  Procedure Laterality Date  . CARDIAC CATHETERIZATION  2005   stent  . CHONDROPLASTY  06/25/2017   Procedure: CHONDROPLASTY;  Surgeon: Dorna Leitz, MD;  Location: [place];  Service: Orthopedics;;  . COLONOSCOPY  11/2011   Due 5 years Dr. Fuller Plan  . heart stent  2005   RCA  . INGUINAL HERNIA REPAIR Right 11/16/2014   Procedure: RIGHT INGUINAL HERNIA REPAIR WITH MESH;  Surgeon: Armandina Gemma, MD;  Location: [place];  Service: General;  Laterality: Right;  . INSERTION OF MESH Right 11/16/2014   Procedure: INSERTION OF MESH;  Surgeon: Armandina Gemma, MD;  Location: [place];  Service: General;  Laterality: Right;  . KNEE ARTHROSCOPY WITH MEDIAL MENISECTOMY Bilateral 06/25/2017   Procedure: right knee arthroscopy with medial meniscectomy, chondroplasty, Left knee arthroscopy with medial menisectomy removal of plica and chondroplasty;  Surgeon: Dorna Leitz,  MD;  Location: [place];  Service: Orthopedics;  Laterality: Bilateral;  . POLYPECTOMY      Family History  Problem Relation Age of Onset  . Prostate cancer Father   . Hypertension Father   . Cancer Mother        vaginal?   . Kidney cancer Maternal Grandmother    Social History:  reports that he quit smoking about 12 months ago. His smoking use included cigarettes. He has a 40.00 pack-year smoking history. He has never used smokeless tobacco. He reports current alcohol use. He reports that he does not use drugs.  Allergies: No Known Allergies  (Not in a hospital admission)   Results for orders placed or performed during the hospital encounter of 01/15/19 (from the past 48 hour(s))  Basic metabolic panel     Status: Abnormal   Collection Time: 01/15/19  8:07 PM  Result Value Ref Range   Sodium 136 135 - 145 mmol/L   Potassium 4.3 3.5 - 5.1 mmol/L   Chloride 104 98 - 111 mmol/L   CO2 24 22 - 32 mmol/L   Glucose, Bld 260 (H) 70 - 99 mg/dL   BUN 11 8 - 23 mg/dL   Creatinine, Ser 0.73 0.61 - 1.24 mg/dL   Calcium 9.3 8.9 - 10.3 mg/dL   GFR calc non Af Amer >60 >60 mL/min   GFR calc Af Amer >60 >60 mL/min  Anion gap 8 5 - 15    Comment: Performed at [facility], [place] [address], [place], [place] [address]  CBC with Differential     Status: Abnormal   Collection Time: 01/15/19  8:07 PM  Result Value Ref Range   WBC 14.1 (H) 4.0 - 10.5 K/uL   RBC 4.70 4.22 - 5.81 MIL/uL   Hemoglobin 14.9 13.0 - 17.0 g/dL   HCT 44.6 39.0 - 52.0 %   MCV 94.9 80.0 - 100.0 fL   MCH 31.7 26.0 - 34.0 pg   MCHC 33.4 30.0 - 36.0 g/dL   RDW 12.6 11.5 - 15.5 %   Platelets 169 150 - 400 K/uL   nRBC 0.0 0.0 - 0.2 %   Neutrophils Relative % 83 %   Neutro Abs 11.7 (H) 1.7 - 7.7 K/uL   Lymphocytes Relative 9 %   Lymphs Abs 1.3 0.7 - 4.0 K/uL   Monocytes Relative 6 %   Monocytes Absolute 0.8 0.1 - 1.0 K/uL   Eosinophils Relative 2 %   Eosinophils Absolute 0.2 0.0 -  0.5 K/uL   Basophils Relative 0 %   Basophils Absolute 0.1 0.0 - 0.1 K/uL   Immature Granulocytes 0 %   Abs Immature Granulocytes 0.05 0.00 - 0.07 K/uL    Comment: Performed at [facility], [place] [address], [place], [place] [address]  SARS Coronavirus 2 (CEPHEID - Performed in [facility] hospital lab), Hosp Order     Status: None   Collection Time: 01/15/19  8:22 PM  Result Value Ref Range   SARS Coronavirus 2 NEGATIVE NEGATIVE    Comment: (NOTE) If result is NEGATIVE SARS-CoV-2 target nucleic acids are NOT DETECTED. The SARS-CoV-2 RNA is generally detectable in upper and lower  respiratory specimens during the acute phase of infection. The lowest  concentration of SARS-CoV-2 viral copies this assay can detect is 250  copies / mL. A negative result does not preclude SARS-CoV-2 infection  and should not be used as the sole basis for treatment or other  patient management decisions.  A negative result may occur with  improper specimen collection / handling, submission of specimen other  than nasopharyngeal swab, presence of viral mutation(s) within the  areas targeted by this assay, and inadequate number of viral copies  (<250 copies / mL). A negative result must be combined with clinical  observations, patient history, and epidemiological information. If result is POSITIVE SARS-CoV-2 target nucleic acids are DETECTED. The SARS-CoV-2 RNA is generally detectable in upper and lower  respiratory specimens dur ing the acute phase of infection.  Positive  results are indicative of active infection with SARS-CoV-2.  Clinical  correlation with patient history and other diagnostic information is  necessary to determine patient infection status.  Positive results do  not rule out bacterial infection or co-infection with other viruses. If result is PRESUMPTIVE POSTIVE SARS-CoV-2 nucleic acids MAY BE PRESENT.   A presumptive positive result was obtained on the submitted  specimen  and confirmed on repeat testing.  While 2019 novel coronavirus  (SARS-CoV-2) nucleic acids may be present in the submitted sample  additional confirmatory testing may be necessary for epidemiological  and / or clinical management purposes  to differentiate between  SARS-CoV-2 and other Sarbecovirus currently known to infect humans.  If clinically indicated additional testing with an alternate test  methodology [phone]) is advised. The SARS-CoV-2 RNA is generally  detectable in upper and lower respiratory sp ecimens during the acute  phase of infection. The expected result is Negative. Fact Sheet for Patients:  StrictlyIdeas.no Fact Sheet for Healthcare Providers: BankingDealers.co.za This test is not yet approved or cleared by the [place] FDA and has been authorized for detection and/or diagnosis of SARS-CoV-2 by FDA under an Emergency Use Authorization (EUA).  This EUA will remain in effect (meaning this test can be used) for the duration of the COVID-19 declaration under Section 564(b)(1) of the Act, 21 U.S.C. section 360bbb-3(b)(1), unless the authorization is terminated or revoked sooner. Performed at [facility], [place] [address], [place], [place] [address]    Dg Ribs Unilateral W/chest Left  Result Date: 01/15/2019 CLINICAL DATA:  Pain status post fall EXAM: LEFT RIBS AND CHEST - 3+ VIEW COMPARISON:  Chest x-ray dated 11/18/2015. FINDINGS: The lung volumes are low. Heart size is mildly enlarged. There appear to be small bilateral pleural effusions. There are hazy bilateral airspace opacities. There is no pneumothorax. There are multiple minimally displaced fractures involving the sixth through eighth ribs posteriorly on the left. IMPRESSION: 1. Acute minimally displaced fracture involving the sixth through eighth ribs posteriorly on the left. 2. No pneumothorax. 3. Low lung volumes with small bilateral  pleural effusions and hazy airspace opacities concerning for volume overload with developing pulmonary edema. An atypical infectious process can have a similar appearance. Electronically Signed   By: Constance Holster M.D.   On: 01/15/2019 19:10   Dg Ankle Complete Left  Result Date: 01/15/2019 CLINICAL DATA:  Pain EXAM: LEFT ANKLE COMPLETE - 3+ VIEW COMPARISON:  None. FINDINGS: There is an acute fracture of the medial malleolus with surrounding soft tissue swelling. There is an old healed fracture of the distal fibular diaphysis. There is a moderate-sized plantar calcaneal spur. There is an avulsion fracture fragment of the calcaneus visualized on the frontal view. IMPRESSION: 1. Acute nondisplaced a fracture involving the medial malleolus with surrounding soft tissue swelling. 2. Acute avulsion fracture of the calcaneus likely at the attachment of the extensor digitorum brevis muscle. This is best visualized on the frontal view only. Electronically Signed   By: Constance Holster M.D.   On: 01/15/2019 19:07   Ct Chest Wo Contrast  Result Date: 01/15/2019 CLINICAL DATA:  Rib fracture suspected status post trauma. EXAM: CT CHEST WITHOUT CONTRAST TECHNIQUE: Multidetector CT imaging of the chest was performed following the standard protocol without IV contrast. COMPARISON:  CT chest dated 12/15/2016. FINDINGS: Cardiovascular: Heart size is normal. There are coronary artery calcifications. There is no evidence of a thoracic aortic aneurysm. Atherosclerotic changes are noted of the thoracic aorta. Mediastinum/Nodes: No enlarged mediastinal or axillary lymph nodes. Thyroid gland, trachea, and esophagus demonstrate no significant findings. Lungs/Pleura: Emphysematous changes are noted at the lung apices. Bilateral airspace consolidation is visualized. Evaluation is limited by significant motion artifact. The trachea is unremarkable. No significant pleural effusion. Upper Abdomen: No acute abnormality.  Musculoskeletal: Again identified are mildly displaced fractures involving the sixth through eighth ribs posteriorly on the left. IMPRESSION: 1. Acute minimally displaced fractures involving the sixth through eighth ribs posteriorly on the left. There is no pneumothorax. 2. Symmetric bibasilar airspace opacities concerning for aspiration or atelectasis. 3. Emphysematous changes bilaterally. Aortic Atherosclerosis (ICD10-I70.0) and Emphysema (ICD10-J43.9). Electronically Signed   By: Constance Holster M.D.   On: 01/15/2019 21:10    Review of Systems  Constitutional: Negative.   HENT: Negative.   Eyes: Negative.   Respiratory: Negative.   Cardiovascular: Positive for chest pain.  Gastrointestinal: Negative.  Genitourinary: Negative.   Musculoskeletal: Negative.   Skin: Negative.   Neurological: Negative.   Endo/Heme/Allergies: Negative.   Psychiatric/Behavioral: Negative.     Blood pressure 138/70, pulse 71, temperature 98.6 F (37 C), temperature source Oral, resp. rate 18, height 5\' 10"  (1.778 m), weight 86.2 kg, SpO2 96 %. Physical Exam  Constitutional: He is oriented to person, place, and time. He appears well-developed and well-nourished. No distress.  HENT:  Head: Normocephalic and atraumatic.  Mouth/Throat: No oropharyngeal exudate.  Eyes: Pupils are equal, round, and reactive to light. Conjunctivae and EOM are normal.  Neck: Normal range of motion. Neck supple.  No neck pain  Cardiovascular: Normal rate, regular rhythm and normal heart sounds.  Respiratory: Effort normal and breath sounds normal. No stridor. No respiratory distress.  Tenderness over left ribs  GI: Soft. Bowel sounds are normal. He exhibits no distension. There is no abdominal tenderness.  Musculoskeletal: Normal range of motion.        General: No tenderness or edema.     Comments: Left foot in boot. Sensation and motor function intact. Good distal pulse  Neurological: He is alert and oriented to person,  place, and time. Coordination normal.  Skin: Skin is warm and dry. No rash noted.  Psychiatric: He has a normal mood and affect. His behavior is normal. Thought content normal.     Assessment/Plan The patient suffered a fall. He has a left ankle fx and left rib fxs. Will admit to trauma. Consult ortho. Manage pain and monitor breathing  Autumn Messing III, MD 01/15/2019, 10:51 PM

## 2019-01-15 NOTE — ED Notes (Signed)
Patient transported to CT 

## 2019-01-16 DIAGNOSIS — E119 Type 2 diabetes mellitus without complications: Secondary | ICD-10-CM | POA: Diagnosis not present

## 2019-01-16 DIAGNOSIS — S82892A Other fracture of left lower leg, initial encounter for closed fracture: Secondary | ICD-10-CM

## 2019-01-16 DIAGNOSIS — Z7982 Long term (current) use of aspirin: Secondary | ICD-10-CM | POA: Diagnosis not present

## 2019-01-16 DIAGNOSIS — Z87891 Personal history of nicotine dependence: Secondary | ICD-10-CM | POA: Diagnosis not present

## 2019-01-16 DIAGNOSIS — Z955 Presence of coronary angioplasty implant and graft: Secondary | ICD-10-CM | POA: Diagnosis not present

## 2019-01-16 DIAGNOSIS — I2510 Atherosclerotic heart disease of native coronary artery without angina pectoris: Secondary | ICD-10-CM | POA: Diagnosis not present

## 2019-01-16 DIAGNOSIS — S92035A Nondisplaced avulsion fracture of tuberosity of left calcaneus, initial encounter for closed fracture: Secondary | ICD-10-CM | POA: Diagnosis not present

## 2019-01-16 DIAGNOSIS — J449 Chronic obstructive pulmonary disease, unspecified: Secondary | ICD-10-CM | POA: Diagnosis not present

## 2019-01-16 DIAGNOSIS — Y9389 Activity, other specified: Secondary | ICD-10-CM | POA: Diagnosis not present

## 2019-01-16 DIAGNOSIS — E785 Hyperlipidemia, unspecified: Secondary | ICD-10-CM | POA: Diagnosis not present

## 2019-01-16 DIAGNOSIS — S92002A Unspecified fracture of left calcaneus, initial encounter for closed fracture: Secondary | ICD-10-CM | POA: Diagnosis not present

## 2019-01-16 DIAGNOSIS — S2242XA Multiple fractures of ribs, left side, initial encounter for closed fracture: Secondary | ICD-10-CM | POA: Diagnosis not present

## 2019-01-16 DIAGNOSIS — G2581 Restless legs syndrome: Secondary | ICD-10-CM | POA: Diagnosis not present

## 2019-01-16 DIAGNOSIS — S8252XA Displaced fracture of medial malleolus of left tibia, initial encounter for closed fracture: Secondary | ICD-10-CM | POA: Diagnosis not present

## 2019-01-16 DIAGNOSIS — Z1159 Encounter for screening for other viral diseases: Secondary | ICD-10-CM | POA: Diagnosis not present

## 2019-01-16 DIAGNOSIS — Z8249 Family history of ischemic heart disease and other diseases of the circulatory system: Secondary | ICD-10-CM | POA: Diagnosis not present

## 2019-01-16 DIAGNOSIS — I10 Essential (primary) hypertension: Secondary | ICD-10-CM | POA: Diagnosis not present

## 2019-01-16 DIAGNOSIS — M25572 Pain in left ankle and joints of left foot: Secondary | ICD-10-CM | POA: Diagnosis present

## 2019-01-16 DIAGNOSIS — Z79899 Other long term (current) drug therapy: Secondary | ICD-10-CM | POA: Diagnosis not present

## 2019-01-16 DIAGNOSIS — K219 Gastro-esophageal reflux disease without esophagitis: Secondary | ICD-10-CM | POA: Diagnosis not present

## 2019-01-16 DIAGNOSIS — Z7984 Long term (current) use of oral hypoglycemic drugs: Secondary | ICD-10-CM | POA: Diagnosis not present

## 2019-01-16 DIAGNOSIS — G4733 Obstructive sleep apnea (adult) (pediatric): Secondary | ICD-10-CM | POA: Diagnosis not present

## 2019-01-16 DIAGNOSIS — W11XXXA Fall on and from ladder, initial encounter: Secondary | ICD-10-CM | POA: Diagnosis present

## 2019-01-16 LAB — CBC
HCT: 41.3 % (ref 39.0–52.0)
Hemoglobin: 13.9 g/dL (ref 13.0–17.0)
MCH: 31.4 pg (ref 26.0–34.0)
MCHC: 33.7 g/dL (ref 30.0–36.0)
MCV: 93.2 fL (ref 80.0–100.0)
Platelets: 151 10*3/uL (ref 150–400)
RBC: 4.43 MIL/uL (ref 4.22–5.81)
RDW: 12.6 % (ref 11.5–15.5)
WBC: 11.6 10*3/uL — ABNORMAL HIGH (ref 4.0–10.5)
nRBC: 0 % (ref 0.0–0.2)

## 2019-01-16 LAB — HEMOGLOBIN A1C
Hgb A1c MFr Bld: 9.1 % — ABNORMAL HIGH (ref 4.8–5.6)
Mean Plasma Glucose: 214.47 mg/dL

## 2019-01-16 LAB — GLUCOSE, CAPILLARY
Glucose-Capillary: 190 mg/dL — ABNORMAL HIGH (ref 70–99)
Glucose-Capillary: 170 mg/dL — ABNORMAL HIGH (ref 70–99)
Glucose-Capillary: 177 mg/dL — ABNORMAL HIGH (ref 70–99)
Glucose-Capillary: 165 mg/dL — ABNORMAL HIGH (ref 70–99)

## 2019-01-16 LAB — HIV ANTIBODY (ROUTINE TESTING W REFLEX): HIV Screen 4th Generation wRfx: NONREACTIVE

## 2019-01-16 MED ORDER — FLUTICASONE FUROATE-VILANTEROL 100-25 MCG/INH IN AEPB
1.0000 | INHALATION_SPRAY | Freq: Every day | RESPIRATORY_TRACT | Status: DC
  Administered 2019-01-18: 1 via RESPIRATORY_TRACT
  Filled 2019-01-16: qty 28

## 2019-01-16 MED ORDER — ONDANSETRON 4 MG PO TBDP: 4.0000 mg | ORAL_TABLET | Freq: Four times a day (QID) | ORAL | Status: DC | PRN

## 2019-01-16 MED ORDER — METHOCARBAMOL 500 MG PO TABS
500.0000 mg | ORAL_TABLET | Freq: Three times a day (TID) | ORAL | Status: DC
  Administered 2019-01-16 – 2019-01-17 (×4): 500 mg via ORAL
  Filled 2019-01-16 (×4): qty 1

## 2019-01-16 MED ORDER — ACETAMINOPHEN 500 MG PO TABS
1000.0000 mg | ORAL_TABLET | Freq: Four times a day (QID) | ORAL | Status: DC
  Administered 2019-01-16 – 2019-01-18 (×9): 1000 mg via ORAL
  Filled 2019-01-16 (×9): qty 2

## 2019-01-16 MED ORDER — OXYCODONE HCL 5 MG PO TABS
5.0000 mg | ORAL_TABLET | ORAL | Status: DC | PRN
  Administered 2019-01-16 – 2019-01-18 (×6): 10 mg via ORAL
  Filled 2019-01-16 (×6): qty 2

## 2019-01-16 MED ORDER — MORPHINE SULFATE (PF) 4 MG/ML IV SOLN
1.0000 mg | INTRAVENOUS | Status: DC | PRN
  Administered 2019-01-16 – 2019-01-17 (×2): 4 mg via INTRAVENOUS
  Filled 2019-01-16 (×2): qty 1

## 2019-01-16 MED ORDER — GLIMEPIRIDE 2 MG PO TABS
2.0000 mg | ORAL_TABLET | Freq: Two times a day (BID) | ORAL | Status: DC
  Administered 2019-01-16 – 2019-01-18 (×4): 2 mg via ORAL
  Filled 2019-01-16 (×5): qty 1

## 2019-01-16 MED ORDER — RAMIPRIL 5 MG PO CAPS
5.0000 mg | ORAL_CAPSULE | Freq: Every day | ORAL | Status: DC
  Administered 2019-01-16 – 2019-01-18 (×3): 5 mg via ORAL
  Filled 2019-01-16 (×3): qty 1

## 2019-01-16 MED ORDER — HYDROCODONE-ACETAMINOPHEN 5-325 MG PO TABS: 1.0000 | ORAL_TABLET | ORAL | Status: DC | PRN

## 2019-01-16 MED ORDER — DOXAZOSIN MESYLATE 8 MG PO TABS
8.0000 mg | ORAL_TABLET | Freq: Every day | ORAL | Status: DC
  Administered 2019-01-16 – 2019-01-18 (×3): 8 mg via ORAL
  Filled 2019-01-16 (×3): qty 1

## 2019-01-16 MED ORDER — LIVING WELL WITH DIABETES BOOK
Freq: Once | Status: AC
  Administered 2019-01-16: 17:00:00
  Filled 2019-01-16: qty 1

## 2019-01-16 MED ORDER — HEPARIN SODIUM (PORCINE) 5000 UNIT/ML IJ SOLN
5000.0000 [IU] | Freq: Three times a day (TID) | INTRAMUSCULAR | Status: DC
  Administered 2019-01-16 – 2019-01-18 (×6): 5000 [IU] via SUBCUTANEOUS
  Filled 2019-01-16 (×6): qty 1

## 2019-01-16 MED ORDER — PANTOPRAZOLE SODIUM 40 MG PO TBEC
40.0000 mg | DELAYED_RELEASE_TABLET | Freq: Every day | ORAL | Status: DC
  Administered 2019-01-16 – 2019-01-18 (×3): 40 mg via ORAL
  Filled 2019-01-16 (×3): qty 1

## 2019-01-16 MED ORDER — MORPHINE SULFATE (PF) 2 MG/ML IV SOLN: 1.0000 mg | INTRAVENOUS | Status: DC | PRN

## 2019-01-16 MED ORDER — METFORMIN HCL 500 MG PO TABS
1000.0000 mg | ORAL_TABLET | Freq: Two times a day (BID) | ORAL | Status: DC
  Administered 2019-01-16 – 2019-01-18 (×5): 1000 mg via ORAL
  Filled 2019-01-16 (×5): qty 2

## 2019-01-16 MED ORDER — INSULIN ASPART 100 UNIT/ML ~~LOC~~ SOLN
0.0000 [IU] | Freq: Three times a day (TID) | SUBCUTANEOUS | Status: DC
  Administered 2019-01-16 – 2019-01-17 (×4): 2 [IU] via SUBCUTANEOUS
  Administered 2019-01-17: 17:00:00 1 [IU] via SUBCUTANEOUS
  Administered 2019-01-17 – 2019-01-18 (×2): 2 [IU] via SUBCUTANEOUS

## 2019-01-16 MED ORDER — ONDANSETRON HCL 4 MG/2ML IJ SOLN: 4.0000 mg | Freq: Four times a day (QID) | INTRAMUSCULAR | Status: DC | PRN

## 2019-01-16 MED ORDER — ROSUVASTATIN CALCIUM 20 MG PO TABS
20.0000 mg | ORAL_TABLET | Freq: Every day | ORAL | Status: DC
  Administered 2019-01-16 – 2019-01-18 (×3): 20 mg via ORAL
  Filled 2019-01-16 (×3): qty 1

## 2019-01-16 MED ORDER — FINASTERIDE 5 MG PO TABS
5.0000 mg | ORAL_TABLET | Freq: Every day | ORAL | Status: DC
  Administered 2019-01-16 – 2019-01-18 (×3): 5 mg via ORAL
  Filled 2019-01-16 (×3): qty 1

## 2019-01-16 MED ORDER — SODIUM CHLORIDE 0.9 % IV SOLN
INTRAVENOUS | Status: DC
  Administered 2019-01-16 – 2019-01-17 (×3): via INTRAVENOUS

## 2019-01-16 MED ORDER — PANTOPRAZOLE SODIUM 40 MG IV SOLR: 40.0000 mg | Freq: Every day | INTRAVENOUS | Status: DC

## 2019-01-16 MED ORDER — MENTHOL 3 MG MT LOZG
1.0000 | LOZENGE | OROMUCOSAL | Status: DC | PRN
  Administered 2019-01-16: 19:00:00 3 mg via ORAL
  Filled 2019-01-16: qty 9

## 2019-01-16 NOTE — Evaluation (Signed)
Physical Therapy Evaluation Patient Details Name: Joe Green MRN: [id number] DOB: 1950/10/20 Today's Date: 01/16/2019   History of Present Illness  Patient is a 68 y/o male presenting to the ED on 01/15/2019 after fall from ladder with resultant L medial mal fx and a calc avulsion fx, as well as multiple rib fractures. PMH significant of CAD, HTN, OSA, DM.    Clinical Impression  Patient admitted with the above listed diagnosis. Patient reports independence with mobility and ADLs prior to admission. Patient received in bed - reporting discomfort mainly at L chest/ribs. Encouraged patient to come to EOB with Min A - most limited by pain. Patient unable to demonstrate transfers or OOB mobility at this time. Will require PT session tomorrow to continue to assess functional mobility. Does have a w/c lift at porch. Will continue to follow.      Follow Up Recommendations Home health PT;Supervision/Assistance - 24 hour    Equipment Recommendations  None recommended by PT    Recommendations for Other Services       Precautions / Restrictions Precautions Precautions: Fall Restrictions Weight Bearing Restrictions: Yes LLE Weight Bearing: Weight bearing as tolerated Other Position/Activity Restrictions: with CAM boot      Mobility  Bed Mobility Overal bed mobility: Needs Assistance Bed Mobility: Supine to Sit;Sit to Supine     Supine to sit: HOB elevated;Min assist Sit to supine: HOB elevated;Min assist   General bed mobility comments: very painful; Min A for trunk control  Transfers                 General transfer comment: unable due to rib pain   Ambulation/Gait                Stairs            Wheelchair Mobility    Modified Rankin (Stroke Patients Only)       Balance Overall balance assessment: Needs assistance Sitting-balance support: No upper extremity supported;Feet supported Sitting balance-Leahy Scale: Fair                                        Pertinent Vitals/Pain Pain Assessment: 0-10 Pain Score: 10-Worst pain ever Pain Location: L ribs Pain Descriptors / Indicators: Aching;Discomfort;Grimacing;Guarding Pain Intervention(s): Monitored during session;Limited activity within patient's tolerance;Patient requesting pain meds-RN notified;Repositioned;Ice applied    Home Living Family/patient expects to be discharged to:: Private residence Living Arrangements: Spouse/significant other Available Help at Discharge: Family;Available 24 hours/day(wife with back problems) Type of Home: House Home Access: Stairs to enter(w/c lift ) Entrance Stairs-Rails: Left Entrance Stairs-Number of Steps: 4 Home Layout: One level Home Equipment: Walker - 2 wheels;Shower seat      Prior Function Level of Independence: Independent         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 Extremity Assessment Upper Extremity Assessment: Defer to OT evaluation    Lower Extremity Assessment Lower Extremity Assessment: LLE deficits/detail LLE Deficits / Details: L LE in CAM boot LLE: Unable to fully assess due to immobilization    Cervical / Trunk Assessment Cervical / Trunk Assessment: Normal  Communication   Communication: No difficulties  Cognition Arousal/Alertness: Awake/alert Behavior During Therapy: WFL for tasks assessed/performed Overall Cognitive Status: Within Functional Limits for tasks assessed  General Comments      Exercises     Assessment/Plan    PT Assessment Patient needs continued PT services  PT Problem List Decreased strength;Decreased activity tolerance;Decreased balance;Decreased mobility;Decreased knowledge of use of DME;Decreased safety awareness       PT Treatment Interventions DME instruction;Gait training;Stair training;Functional mobility training;Therapeutic  activities;Therapeutic exercise;Balance training;Patient/family education    PT Goals (Current goals can be found in the Care Plan section)  Acute Rehab PT Goals Patient Stated Goal: reduce pain PT Goal Formulation: With patient Time For Goal Achievement: 01/30/19 Potential to Achieve Goals: Good    Frequency Min 3X/week   Barriers to discharge        Co-evaluation               AM-PAC PT "6 Clicks" Mobility  Outcome Measure Help needed turning from your back to your side while in a flat bed without using bedrails?: A Little Help needed moving from lying on your back to sitting on the side of a flat bed without using bedrails?: A Little Help needed moving to and from a bed to a chair (including a wheelchair)?: A Lot Help needed standing up from a chair using your arms (e.g., wheelchair or bedside chair)?: A Lot Help needed to walk in hospital room?: Total Help needed climbing 3-5 steps with a railing? : Total 6 Click Score: 12    End of Session Equipment Utilized During Treatment: (L CAM boot) Activity Tolerance: Patient limited by pain Patient left: in bed;with call bell/phone within reach;with nursing/sitter in room Nurse Communication: Mobility status PT Visit Diagnosis: Unsteadiness on feet (R26.81);Other abnormalities of gait and mobility (R26.89);Muscle weakness (generalized) (M62.81)    Time: 7209-4709 PT Time Calculation (min) (ACUTE ONLY): 23 min   Charges:   PT Evaluation $PT Eval Low Complexity: 1 Low PT Treatments $Therapeutic Activity: 8-22 mins        Lanney Gins, PT, DPT Supplemental Physical Therapist 01/16/19 3:46 PM Pager: [phone] Office: [phone]

## 2019-01-16 NOTE — Progress Notes (Addendum)
Patient ID: Joe Green, male   DOB: [birth year], 68 y.o.   MRN: [id number]       Subjective: Patient mostly c/o rib pain on the left side.  Has some ankle pain, but is in a boot and seems controlled.  Eating breakfast.  Nothing else bothering him  Objective: Vital signs in last 24 hours: Temp:  [97.8 F (36.6 C)-98.6 F (37 C)] 98.4 F (36.9 C) (06/04 0756) Pulse Rate:  [71-98] 75 (06/04 0756) Resp:  [15-21] 15 (06/04 0756) BP: (127-153)/(66-91) 127/72 (06/04 0756) SpO2:  [90 %-97 %] 94 % (06/04 0756) Weight:  [86.2 kg] 86.2 kg (06/03 1559) Last BM Date: 01/15/19  Intake/Output from previous day: 06/03 0701 - 06/04 0700 In: 1214.3 [I.V.:1214.3] Out: -  Intake/Output this shift: No intake/output data recorded.  PE: Gen: NAD Heart: regular Lungs: CTAB, chest wall tenderness on left lateral chest wall  Abd: soft, NT, ND, +BS Ext: MAE, LLE in CAM boot.  Toes with normal sensation and wiggles well. +2 pedal pulse on right side.  No pain in arms or legs otherwise  Lab Results:  Recent Labs    01/15/19 2007  WBC 14.1*  HGB 14.9  HCT 44.6  PLT 169   BMET Recent Labs    01/15/19 2007  NA 136  K 4.3  CL 104  CO2 24  GLUCOSE 260*  BUN 11  CREATININE 0.73  CALCIUM 9.3   PT/INR No results for input(s): LABPROT, INR in the last 72 hours. CMP     Component Value Date/Time   NA 136 01/15/2019 2007   K 4.3 01/15/2019 2007   CL 104 01/15/2019 2007   CO2 24 01/15/2019 2007   GLUCOSE 260 (H) 01/15/2019 2007   BUN 11 01/15/2019 2007   CREATININE 0.73 01/15/2019 2007   CREATININE 0.70 07/25/2018 0000   CALCIUM 9.3 01/15/2019 2007   PROT 7.1 07/25/2018 0000   ALBUMIN 4.1 03/02/2017 1115   AST 16 07/25/2018 0000   ALT 17 07/25/2018 0000   ALKPHOS 84 03/02/2017 1115   BILITOT 0.6 07/25/2018 0000   GFRNONAA >60 01/15/2019 2007   GFRNONAA 98 07/25/2018 0000   GFRAA >60 01/15/2019 2007   GFRAA 114 07/25/2018 0000   Lipase  No results found for:  LIPASE     Studies/Results: Dg Ribs Unilateral W/chest Left  Result Date: 01/15/2019 CLINICAL DATA:  Pain status post fall EXAM: LEFT RIBS AND CHEST - 3+ VIEW COMPARISON:  Chest x-ray dated 11/18/2015. FINDINGS: The lung volumes are low. Heart size is mildly enlarged. There appear to be small bilateral pleural effusions. There are hazy bilateral airspace opacities. There is no pneumothorax. There are multiple minimally displaced fractures involving the sixth through eighth ribs posteriorly on the left. IMPRESSION: 1. Acute minimally displaced fracture involving the sixth through eighth ribs posteriorly on the left. 2. No pneumothorax. 3. Low lung volumes with small bilateral pleural effusions and hazy airspace opacities concerning for volume overload with developing pulmonary edema. An atypical infectious process can have a similar appearance. Electronically Signed   By: Constance Holster M.D.   On: 01/15/2019 19:10   Dg Ankle Complete Left  Result Date: 01/15/2019 CLINICAL DATA:  Pain EXAM: LEFT ANKLE COMPLETE - 3+ VIEW COMPARISON:  None. FINDINGS: There is an acute fracture of the medial malleolus with surrounding soft tissue swelling. There is an old healed fracture of the distal fibular diaphysis. There is a moderate-sized plantar calcaneal spur. There is an avulsion fracture fragment of the  calcaneus visualized on the frontal view. IMPRESSION: 1. Acute nondisplaced a fracture involving the medial malleolus with surrounding soft tissue swelling. 2. Acute avulsion fracture of the calcaneus likely at the attachment of the extensor digitorum brevis muscle. This is best visualized on the frontal view only. Electronically Signed   By: Constance Holster M.D.   On: 01/15/2019 19:07   Ct Chest Wo Contrast  Result Date: 01/15/2019 CLINICAL DATA:  Rib fracture suspected status post trauma. EXAM: CT CHEST WITHOUT CONTRAST TECHNIQUE: Multidetector CT imaging of the chest was performed following the standard  protocol without IV contrast. COMPARISON:  CT chest dated 12/15/2016. FINDINGS: Cardiovascular: Heart size is normal. There are coronary artery calcifications. There is no evidence of a thoracic aortic aneurysm. Atherosclerotic changes are noted of the thoracic aorta. Mediastinum/Nodes: No enlarged mediastinal or axillary lymph nodes. Thyroid gland, trachea, and esophagus demonstrate no significant findings. Lungs/Pleura: Emphysematous changes are noted at the lung apices. Bilateral airspace consolidation is visualized. Evaluation is limited by significant motion artifact. The trachea is unremarkable. No significant pleural effusion. Upper Abdomen: No acute abnormality. Musculoskeletal: Again identified are mildly displaced fractures involving the sixth through eighth ribs posteriorly on the left. IMPRESSION: 1. Acute minimally displaced fractures involving the sixth through eighth ribs posteriorly on the left. There is no pneumothorax. 2. Symmetric bibasilar airspace opacities concerning for aspiration or atelectasis. 3. Emphysematous changes bilaterally. Aortic Atherosclerosis (ICD10-I70.0) and Emphysema (ICD10-J43.9). Electronically Signed   By: Constance Holster M.D.   On: 01/15/2019 21:10    Anti-infectives: Anti-infectives (From admission, onward)   None       Assessment/Plan Fall from Ladder Left 6-8 rib fx - IS, pulm toilet, pain control Left medial malleolus FX - in boot right now, ortho to see, PT/OT Left calcaneous FX - ortho to see, in boot for now PT/OT DM - appears to not be well controlled at home, on SSI and metformin here, but HgbA1C up to 9.  Will ask diabetes team to see patient Tobacco abuse - patient states he is quitting after all of this  HTN - on home meds CAD - stable FEN - carb mod diet VTE - SCds, heparin ID - none   LOS: 0 days    Henreitta Cea , [facility] Surgery 01/16/2019, 8:20 AM Pager: [phone]

## 2019-01-16 NOTE — ED Notes (Signed)
[facility] at bedside to transport patient. Patient A&O x4 and stable at time of transport. Patient belongings taken by patient.

## 2019-01-16 NOTE — ED Notes (Signed)
Report called to Zacarias Pontes RN. [facility] contacted for transport. Paperwork printed and at nurses station.

## 2019-01-16 NOTE — Plan of Care (Signed)

## 2019-01-16 NOTE — Progress Notes (Signed)
Inpatient Diabetes Program Recommendations  AACE/ADA: New Consensus Statement on Inpatient Glycemic Control (2015)  Target Ranges:  Prepandial:   less than 140 mg/dL      Peak postprandial:   less than 180 mg/dL (1-2 hours)      Critically ill patients:  140 - 180 mg/dL     Review of Glycemic Control  Diabetes history: DM 2 Outpatient Diabetes medications: Metformin 1000 mg BID, Farxiga (was taken off due to yeast infections)  Current orders for Inpatient glycemic control:  Novolog 0-9 units tid Metformin 1000 mg BID  Inpatient Diabetes Program Recommendations:    Spoke with patient at bedside regarding glucose control at home. Patient has not been on farxiga due to yeast infections, patient only taking metformin at home. Patient has been on glimepiride in the past (sulfonylurea). Patient ran out of test strips but insurance does NOT cover his one touch strips anymore.   Discussed current A1c, 9.1%. Discussed glucose and A1c goals. Discussed in details lifestyle modifications especially with diet and exercise after working with PT over time. Discussed consistent PO intake with possibly consuming Protein shakes when he does not feel like eating.   At time of d/c patient will need:  -  New glucose meter kit order #[id number] -  Glimepiride 2 mg BID  Thanks,  Tama Headings RN, MSN, BC-ADM Inpatient Diabetes Coordinator Team Pager [phone] (8a-5p)

## 2019-01-16 NOTE — Consult Note (Addendum)
Reason for Consult:Left ankle fx Referring Physician: B Kinston Joe Green is an 68 y.o. male.  HPI: Joe Green fell off a ladder about 7 feet. He hit the ladder with his chest at the bottom. He's not sure exactly how he hurt his ankle. X-rays showed a medial mal fx and a calc avulsion fx and orthopedic surgery was consulted. He c/o rib pain and the ankle pain only.  Past Medical History:  Diagnosis Date  . CAD (coronary artery disease)    a. s/p stent to RCA 2005; b. LHC 2006 after Nuc with inf ischemia: patent RCA stent, D1 80%, LAD 60% => med Rx;  c. LHC 04/18/11: Proximal LAD 60% (unchanged from previous catheterization), distal LAD 65% (small caliber), Dx 60%, proximal CFX 20%, mid CFX 50%, OM1 30%, proximal RCA 20%, mid RCA stent patent, EF 65%.=> med Rx  . GERD (gastroesophageal reflux disease)   . HLD (hyperlipidemia)   . Hypertension   . Obesity   . OSA (obstructive sleep apnea)    has a cpap-does not use-has a special mouthpiece he wears at night  . Type II or unspecified type diabetes mellitus without mention of complication, not stated as uncontrolled   . Vitamin D deficiency   . Wears glasses     Past Surgical History:  Procedure Laterality Date  . CARDIAC CATHETERIZATION  2005   stent  . CHONDROPLASTY  06/25/2017   Procedure: CHONDROPLASTY;  Surgeon: Dorna Leitz, MD;  Location: [facility];  Service: Orthopedics;;  . COLONOSCOPY  11/2011   Due 5 years Dr. Fuller Plan  . heart stent  2005   RCA  . INGUINAL HERNIA REPAIR Right 11/16/2014   Procedure: RIGHT INGUINAL HERNIA REPAIR WITH MESH;  Surgeon: Armandina Gemma, MD;  Location: [facility];  Service: General;  Laterality: Right;  . INSERTION OF MESH Right 11/16/2014   Procedure: INSERTION OF MESH;  Surgeon: Armandina Gemma, MD;  Location: [facility];  Service: General;  Laterality: Right;  . KNEE ARTHROSCOPY WITH MEDIAL MENISECTOMY Bilateral 06/25/2017   Procedure: right knee arthroscopy  with medial meniscectomy, chondroplasty, Left knee arthroscopy with medial menisectomy removal of plica and chondroplasty;  Surgeon: Dorna Leitz, MD;  Location: [facility];  Service: Orthopedics;  Laterality: Bilateral;  . POLYPECTOMY      Family History  Problem Relation Age of Onset  . Prostate cancer Father   . Hypertension Father   . Cancer Mother        vaginal?   . Kidney cancer Maternal Grandmother     Social History:  reports that he quit smoking about 12 months ago. His smoking use included cigarettes. He has a 40.00 pack-year smoking history. He has never used smokeless tobacco. He reports current alcohol use. He reports that he does not use drugs.  Allergies: No Known Allergies  Medications: I have reviewed the patient's current medications.  Results for orders placed or performed during the hospital encounter of 01/15/19 (from the past 48 hour(s))  Basic metabolic panel     Status: Abnormal   Collection Time: 01/15/19  8:07 PM  Result Value Ref Range   Sodium 136 135 - 145 mmol/L   Potassium 4.3 3.5 - 5.1 mmol/L   Chloride 104 98 - 111 mmol/L   CO2 24 22 - 32 mmol/L   Glucose, Bld 260 (H) 70 - 99 mg/dL   BUN 11 8 - 23 mg/dL   Creatinine, Ser 0.73 0.61 - 1.24 mg/dL  Calcium 9.3 8.9 - 10.3 mg/dL   GFR calc non Af Amer >60 >60 mL/min   GFR calc Af Amer >60 >60 mL/min   Anion gap 8 5 - 15    Comment: Performed at [facility], [place] [address], [place], [place] [address]  CBC with Differential     Status: Abnormal   Collection Time: 01/15/19  8:07 PM  Result Value Ref Range   WBC 14.1 (H) 4.0 - 10.5 K/uL   RBC 4.70 4.22 - 5.81 MIL/uL   Hemoglobin 14.9 13.0 - 17.0 g/dL   HCT 44.6 39.0 - 52.0 %   MCV 94.9 80.0 - 100.0 fL   MCH 31.7 26.0 - 34.0 pg   MCHC 33.4 30.0 - 36.0 g/dL   RDW 12.6 11.5 - 15.5 %   Platelets 169 150 - 400 K/uL   nRBC 0.0 0.0 - 0.2 %   Neutrophils Relative % 83 %   Neutro Abs 11.7 (H) 1.7 - 7.7 K/uL    Lymphocytes Relative 9 %   Lymphs Abs 1.3 0.7 - 4.0 K/uL   Monocytes Relative 6 %   Monocytes Absolute 0.8 0.1 - 1.0 K/uL   Eosinophils Relative 2 %   Eosinophils Absolute 0.2 0.0 - 0.5 K/uL   Basophils Relative 0 %   Basophils Absolute 0.1 0.0 - 0.1 K/uL   Immature Granulocytes 0 %   Abs Immature Granulocytes 0.05 0.00 - 0.07 K/uL    Comment: Performed at [facility], [place] [address], [place], [place] [address]  SARS Coronavirus 2 (CEPHEID - Performed in [facility] hospital lab), Hosp Order     Status: None   Collection Time: 01/15/19  8:22 PM  Result Value Ref Range   SARS Coronavirus 2 NEGATIVE NEGATIVE    Comment: (NOTE) If result is NEGATIVE SARS-CoV-2 target nucleic acids are NOT DETECTED. The SARS-CoV-2 RNA is generally detectable in upper and lower  respiratory specimens during the acute phase of infection. The lowest  concentration of SARS-CoV-2 viral copies this assay can detect is 250  copies / mL. A negative result does not preclude SARS-CoV-2 infection  and should not be used as the sole basis for treatment or other  patient management decisions.  A negative result may occur with  improper specimen collection / handling, submission of specimen other  than nasopharyngeal swab, presence of viral mutation(s) within the  areas targeted by this assay, and inadequate number of viral copies  (<250 copies / mL). A negative result must be combined with clinical  observations, patient history, and epidemiological information. If result is POSITIVE SARS-CoV-2 target nucleic acids are DETECTED. The SARS-CoV-2 RNA is generally detectable in upper and lower  respiratory specimens dur ing the acute phase of infection.  Positive  results are indicative of active infection with SARS-CoV-2.  Clinical  correlation with patient history and other diagnostic information is  necessary to determine patient infection status.  Positive results do  not rule out bacterial  infection or co-infection with other viruses. If result is PRESUMPTIVE POSTIVE SARS-CoV-2 nucleic acids MAY BE PRESENT.   A presumptive positive result was obtained on the submitted specimen  and confirmed on repeat testing.  While 2019 novel coronavirus  (SARS-CoV-2) nucleic acids may be present in the submitted sample  additional confirmatory testing may be necessary for epidemiological  and / or clinical management purposes  to differentiate between  SARS-CoV-2 and other Sarbecovirus currently known to infect humans.  If clinically indicated additional testing  with an alternate test  methodology [phone]) is advised. The SARS-CoV-2 RNA is generally  detectable in upper and lower respiratory sp ecimens during the acute  phase of infection. The expected result is Negative. Fact Sheet for Patients:  StrictlyIdeas.no Fact Sheet for Healthcare Providers: BankingDealers.co.za This test is not yet approved or cleared by the [place] FDA and has been authorized for detection and/or diagnosis of SARS-CoV-2 by FDA under an Emergency Use Authorization (EUA).  This EUA will remain in effect (meaning this test can be used) for the duration of the COVID-19 declaration under Section 564(b)(1) of the Act, 21 U.S.C. section 360bbb-3(b)(1), unless the authorization is terminated or revoked sooner. Performed at [facility], [place] [address] [place], [place] [address]   Hemoglobin A1c     Status: Abnormal   Collection Time: 01/16/19  3:05 AM  Result Value Ref Range   Hgb A1c MFr Bld 9.1 (H) 4.8 - 5.6 %    Comment: (NOTE) Pre diabetes:          5.7%-6.4% Diabetes:              >6.4% Glycemic control for   <7.0% adults with diabetes    Mean Plasma Glucose 214.47 mg/dL    Comment: Performed at [place] [address] [place], [place] [address]  Glucose, capillary     Status: Abnormal   Collection Time: 01/16/19   7:51 AM  Result Value Ref Range   Glucose-Capillary 190 (H) 70 - 99 mg/dL    Dg Ribs Unilateral W/chest Left  Result Date: 01/15/2019 CLINICAL DATA:  Pain status post fall EXAM: LEFT RIBS AND CHEST - 3+ VIEW COMPARISON:  Chest x-ray dated 11/18/2015. FINDINGS: The lung volumes are low. Heart size is mildly enlarged. There appear to be small bilateral pleural effusions. There are hazy bilateral airspace opacities. There is no pneumothorax. There are multiple minimally displaced fractures involving the sixth through eighth ribs posteriorly on the left. IMPRESSION: 1. Acute minimally displaced fracture involving the sixth through eighth ribs posteriorly on the left. 2. No pneumothorax. 3. Low lung volumes with small bilateral pleural effusions and hazy airspace opacities concerning for volume overload with developing pulmonary edema. An atypical infectious process can have a similar appearance. Electronically Signed   By: Constance Holster M.D.   On: 01/15/2019 19:10   Dg Ankle Complete Left  Result Date: 01/15/2019 CLINICAL DATA:  Pain EXAM: LEFT ANKLE COMPLETE - 3+ VIEW COMPARISON:  None. FINDINGS: There is an acute fracture of the medial malleolus with surrounding soft tissue swelling. There is an old healed fracture of the distal fibular diaphysis. There is a moderate-sized plantar calcaneal spur. There is an avulsion fracture fragment of the calcaneus visualized on the frontal view. IMPRESSION: 1. Acute nondisplaced a fracture involving the medial malleolus with surrounding soft tissue swelling. 2. Acute avulsion fracture of the calcaneus likely at the attachment of the extensor digitorum brevis muscle. This is best visualized on the frontal view only. Electronically Signed   By: Constance Holster M.D.   On: 01/15/2019 19:07   Ct Chest Wo Contrast  Result Date: 01/15/2019 CLINICAL DATA:  Rib fracture suspected status post trauma. EXAM: CT CHEST WITHOUT CONTRAST TECHNIQUE: Multidetector CT imaging of  the chest was performed following the standard protocol without IV contrast. COMPARISON:  CT chest dated 12/15/2016. FINDINGS: Cardiovascular: Heart size is normal. There are coronary artery calcifications. There is no evidence of a thoracic aortic aneurysm. Atherosclerotic changes are noted  of the thoracic aorta. Mediastinum/Nodes: No enlarged mediastinal or axillary lymph nodes. Thyroid gland, trachea, and esophagus demonstrate no significant findings. Lungs/Pleura: Emphysematous changes are noted at the lung apices. Bilateral airspace consolidation is visualized. Evaluation is limited by significant motion artifact. The trachea is unremarkable. No significant pleural effusion. Upper Abdomen: No acute abnormality. Musculoskeletal: Again identified are mildly displaced fractures involving the sixth through eighth ribs posteriorly on the left. IMPRESSION: 1. Acute minimally displaced fractures involving the sixth through eighth ribs posteriorly on the left. There is no pneumothorax. 2. Symmetric bibasilar airspace opacities concerning for aspiration or atelectasis. 3. Emphysematous changes bilaterally. Aortic Atherosclerosis (ICD10-I70.0) and Emphysema (ICD10-J43.9). Electronically Signed   By: Constance Holster M.D.   On: 01/15/2019 21:10    Review of Systems  Constitutional: Negative for weight loss.  HENT: Negative for ear discharge, ear pain, hearing loss and tinnitus.   Eyes: Negative for blurred vision, double vision, photophobia and pain.  Respiratory: Negative for cough, sputum production and shortness of breath.   Cardiovascular: Positive for chest pain.  Gastrointestinal: Negative for abdominal pain, nausea and vomiting.  Genitourinary: Negative for dysuria, flank pain, frequency and urgency.  Musculoskeletal: Positive for joint pain (Left ankle). Negative for back pain, falls, myalgias and neck pain.  Neurological: Negative for dizziness, tingling, sensory change, focal weakness, loss of  consciousness and headaches.  Endo/Heme/Allergies: Does not bruise/bleed easily.  Psychiatric/Behavioral: Negative for depression, memory loss and substance abuse. The patient is not nervous/anxious.    Blood pressure 127/72, pulse 75, temperature 98.4 F (36.9 C), temperature source Oral, resp. rate 15, height 5\' 10"  (1.778 m), weight 86.2 kg, SpO2 94 %. Physical Exam  Constitutional: He appears well-developed and well-nourished. No distress.  HENT:  Head: Normocephalic and atraumatic.  Eyes: Conjunctivae are normal. Right eye exhibits no discharge. Left eye exhibits no discharge. No scleral icterus.  Neck: Normal range of motion.  Cardiovascular: Normal rate and regular rhythm.  Respiratory: Effort normal. No respiratory distress.  Musculoskeletal:     Comments: LLE No traumatic wounds, ecchymosis, or rash  Boot in place, mod TTP medially, mild TTP lateral ankle  No knee or ankle effusion  Knee stable to varus/ valgus and anterior/posterior stress  Sens DPN, SPN, TN intact  Motor EHL, ext, flex, evers 5/5  DP 2+, PT 1+, No significant edema  Neurological: He is alert.  Skin: Skin is warm and dry. He is not diaphoretic.  Psychiatric: He has a normal mood and affect. His behavior is normal.    Assessment/Plan: Left ankle fx -- May WBAT in CAM boot. F/U with Dr. Erlinda Hong in 2 weeks. Multiple rib fxs Multiple medical problems including HLD, GERD, tobacco use, CAD, HTN, DM -- per trauma    Lisette Abu, PA-C Orthopedic Surgery [phone] 01/16/2019, 9:12 AM

## 2019-01-17 LAB — GLUCOSE, CAPILLARY
Glucose-Capillary: 162 mg/dL — ABNORMAL HIGH (ref 70–99)
Glucose-Capillary: 199 mg/dL — ABNORMAL HIGH (ref 70–99)
Glucose-Capillary: 141 mg/dL — ABNORMAL HIGH (ref 70–99)

## 2019-01-17 MED ORDER — METHOCARBAMOL 500 MG PO TABS
750.0000 mg | ORAL_TABLET | Freq: Three times a day (TID) | ORAL | Status: DC
  Administered 2019-01-17 – 2019-01-18 (×3): 750 mg via ORAL
  Filled 2019-01-17 (×3): qty 2

## 2019-01-17 NOTE — Progress Notes (Signed)
Patient ID: Joe Green, male   DOB: 1950-09-30, 68 y.o.   MRN: [id number]       Subjective: Patient still complains of rib pain mostly.  Pulling 1000-1250 on IS.  Doesn't know how he is going to go home with this pain.  Objective: Vital signs in last 24 hours: Temp:  [98.2 F (36.8 C)-98.5 F (36.9 C)] 98.5 F (36.9 C) (06/05 0327) Pulse Rate:  [74-90] 88 (06/05 0327) Resp:  [17] 17 (06/05 0327) BP: (122-143)/(63-72) 143/72 (06/05 0906) SpO2:  [86 %-95 %] 95 % (06/05 0401) Last BM Date: 01/15/19  Intake/Output from previous day: 06/04 0701 - 06/05 0700 In: 1493.4 [P.O.:960; I.V.:533.4] Out: 1160 [Urine:1160] Intake/Output this shift: Total I/O In: 0  Out: 300 [Urine:300]  PE: Gen: NAD Heart: regular Lungs: some coarse sounds, chest wall tenderness to palpation on left side Abd: soft, NT, ND, +BS Ext: LLE in CAM boot  Lab Results:  Recent Labs    01/15/19 2007 01/16/19 0934  WBC 14.1* 11.6*  HGB 14.9 13.9  HCT 44.6 41.3  PLT 169 151   BMET Recent Labs    01/15/19 2007  NA 136  K 4.3  CL 104  CO2 24  GLUCOSE 260*  BUN 11  CREATININE 0.73  CALCIUM 9.3   PT/INR No results for input(s): LABPROT, INR in the last 72 hours. CMP     Component Value Date/Time   NA 136 01/15/2019 2007   K 4.3 01/15/2019 2007   CL 104 01/15/2019 2007   CO2 24 01/15/2019 2007   GLUCOSE 260 (H) 01/15/2019 2007   BUN 11 01/15/2019 2007   CREATININE 0.73 01/15/2019 2007   CREATININE 0.70 07/25/2018 0000   CALCIUM 9.3 01/15/2019 2007   PROT 7.1 07/25/2018 0000   ALBUMIN 4.1 03/02/2017 1115   AST 16 07/25/2018 0000   ALT 17 07/25/2018 0000   ALKPHOS 84 03/02/2017 1115   BILITOT 0.6 07/25/2018 0000   GFRNONAA >60 01/15/2019 2007   GFRNONAA 98 07/25/2018 0000   GFRAA >60 01/15/2019 2007   GFRAA 114 07/25/2018 0000   Lipase  No results found for: LIPASE     Studies/Results: Dg Ribs Unilateral W/chest Left  Result Date: 01/15/2019 CLINICAL DATA:  Pain status  post fall EXAM: LEFT RIBS AND CHEST - 3+ VIEW COMPARISON:  Chest x-ray dated 11/18/2015. FINDINGS: The lung volumes are low. Heart size is mildly enlarged. There appear to be small bilateral pleural effusions. There are hazy bilateral airspace opacities. There is no pneumothorax. There are multiple minimally displaced fractures involving the sixth through eighth ribs posteriorly on the left. IMPRESSION: 1. Acute minimally displaced fracture involving the sixth through eighth ribs posteriorly on the left. 2. No pneumothorax. 3. Low lung volumes with small bilateral pleural effusions and hazy airspace opacities concerning for volume overload with developing pulmonary edema. An atypical infectious process can have a similar appearance. Electronically Signed   By: Constance Holster M.D.   On: 01/15/2019 19:10   Dg Ankle Complete Left  Result Date: 01/15/2019 CLINICAL DATA:  Pain EXAM: LEFT ANKLE COMPLETE - 3+ VIEW COMPARISON:  None. FINDINGS: There is an acute fracture of the medial malleolus with surrounding soft tissue swelling. There is an old healed fracture of the distal fibular diaphysis. There is a moderate-sized plantar calcaneal spur. There is an avulsion fracture fragment of the calcaneus visualized on the frontal view. IMPRESSION: 1. Acute nondisplaced a fracture involving the medial malleolus with surrounding soft tissue swelling. 2. Acute avulsion  fracture of the calcaneus likely at the attachment of the extensor digitorum brevis muscle. This is best visualized on the frontal view only. Electronically Signed   By: Constance Holster M.D.   On: 01/15/2019 19:07   Ct Chest Wo Contrast  Result Date: 01/15/2019 CLINICAL DATA:  Rib fracture suspected status post trauma. EXAM: CT CHEST WITHOUT CONTRAST TECHNIQUE: Multidetector CT imaging of the chest was performed following the standard protocol without IV contrast. COMPARISON:  CT chest dated 12/15/2016. FINDINGS: Cardiovascular: Heart size is normal.  There are coronary artery calcifications. There is no evidence of a thoracic aortic aneurysm. Atherosclerotic changes are noted of the thoracic aorta. Mediastinum/Nodes: No enlarged mediastinal or axillary lymph nodes. Thyroid gland, trachea, and esophagus demonstrate no significant findings. Lungs/Pleura: Emphysematous changes are noted at the lung apices. Bilateral airspace consolidation is visualized. Evaluation is limited by significant motion artifact. The trachea is unremarkable. No significant pleural effusion. Upper Abdomen: No acute abnormality. Musculoskeletal: Again identified are mildly displaced fractures involving the sixth through eighth ribs posteriorly on the left. IMPRESSION: 1. Acute minimally displaced fractures involving the sixth through eighth ribs posteriorly on the left. There is no pneumothorax. 2. Symmetric bibasilar airspace opacities concerning for aspiration or atelectasis. 3. Emphysematous changes bilaterally. Aortic Atherosclerosis (ICD10-I70.0) and Emphysema (ICD10-J43.9). Electronically Signed   By: Constance Holster M.D.   On: 01/15/2019 21:10    Anti-infectives: Anti-infectives (From admission, onward)   None       Assessment/Plan Fall from Ladder Left 6-8 rib fx - IS, pulm toilet, pain control Left medial malleolus FX - CAM boot, follow up with Dr. Erlinda Hong Left calcaneous FX - as above DM - appreciate DM coordinator evaluation.  Glimepiride added. Tobacco abuse - patient states he is quitting after all of this  HTN - on home meds CAD - stable FEN - carb mod diet VTE - SCDs, heparin ID - none Dispo - pain control, HH PT ordered, home this pm or in am   LOS: 1 day    Henreitta Cea , [facility] Surgery 01/17/2019, 9:59 AM Pager: [phone]

## 2019-01-17 NOTE — Plan of Care (Signed)
  Problem: Activity: Goal: Risk for activity intolerance will decrease Outcome: Progressing   Problem: Pain Managment: Goal: General experience of comfort will improve Outcome: Progressing   

## 2019-01-17 NOTE — Progress Notes (Signed)
Occupational Therapy Evaluation Patient Details Name: Joe Green MRN: [id number] DOB: 28-Feb-1951 Today's Date: 01/17/2019    History of Present Illness Patient is a 68 y/o male presenting to the ED on 01/15/2019 after fall from ladder with resultant L medial mal fx and a calc avulsion fx, as well as multiple rib fractures. PMH significant of CAD, HTN, OSA, DM.   Clinical Impression   Pt admitted with above diagnosis.  PTA, pt is independent with ADLs and mobility and work as a Building control surveyor. Pt is limited by pain in left chest/abdomen due to rib fractures.  Pt requiring min assist for LB dressing and close supervision for other LB and standing ADLs due to pain and unsteadiness on feet. Pt not weight bearing on left LE today stating "he does not feel comfortable to do that just yet."  He used a RW to ambulate within room at supervision level.  Will continue to follow acutely to maximize safety and independence with ADLs before return home.    Follow Up Recommendations  No OT follow up    Equipment Recommendations  None recommended by OT    Recommendations for Other Services       Precautions / Restrictions Precautions Precautions: Fall Restrictions Weight Bearing Restrictions: Yes LLE Weight Bearing: Weight bearing as tolerated Other Position/Activity Restrictions: with CAM boot      Mobility Bed Mobility Overal bed mobility: Needs Assistance Bed Mobility: Rolling;Sidelying to Sit Rolling: Supervision Sidelying to sit: Supervision       General bed mobility comments: Rolling to right side then sidelying to sit. supervision for safety.   Transfers Overall transfer level: Needs assistance Equipment used: Rolling walker (2 wheeled) Transfers: Sit to/from Stand Sit to Stand: Supervision         General transfer comment: Supervision for saftey. VCs for hand placement.    Balance                                           ADL either performed or assessed  with clinical judgement   ADL Overall ADL's : Needs assistance/impaired Eating/Feeding: Independent;Sitting   Grooming: Wash/dry hands;Wash/dry face;Supervision/safety;Standing   Upper Body Bathing: Supervision/ safety;Sitting   Lower Body Bathing: Sit to/from stand;Supervison/ safety   Upper Body Dressing : Supervision/safety;Sitting   Lower Body Dressing: Minimal assistance;Sit to/from stand   Toilet Transfer: Supervision/safety;Ambulation;RW Toilet Transfer Details (indicate cue type and reason): simulated         Functional mobility during ADLs: Supervision/safety;Rolling walker General ADL Comments: Pt requiring increased time for all tasks due to pain. Pt reports he is nervous to try weightbearing on left LE and feels more comfortable with RW.  Pt inquiring if it would be ok to sleep in recliner due to rib fx pain and therapist agreeed this would be a good idea.      Vision         Perception     Praxis      Pertinent Vitals/Pain Pain Assessment: 0-10 Pain Score: 10-Worst pain ever Pain Location: L ribs Pain Descriptors / Indicators: Aching;Discomfort;Grimacing;Guarding Pain Intervention(s): Limited activity within patient's tolerance;Monitored during session;Repositioned     Hand Dominance     Extremity/Trunk Assessment Upper Extremity Assessment Upper Extremity Assessment: Overall WFL for tasks assessed   Lower Extremity Assessment Lower Extremity Assessment: Defer to PT evaluation   Cervical / Trunk Assessment Cervical / Trunk Assessment: Normal  Communication Communication Communication: No difficulties   Cognition Arousal/Alertness: Awake/alert Behavior During Therapy: WFL for tasks assessed/performed Overall Cognitive Status: Within Functional Limits for tasks assessed                                     General Comments       Exercises     Shoulder Instructions      Home Living Family/patient expects to be discharged  to:: Private residence Living Arrangements: Spouse/significant other Available Help at Discharge: Family;Available 24 hours/day(wife with back problems) Type of Home: House Home Access: Stairs to enter(w/c lift) Entrance Stairs-Number of Steps: 4 Entrance Stairs-Rails: Left Home Layout: One level     Bathroom Shower/Tub: Occupational psychologist: Handicapped height     Home Equipment: Environmental consultant - 2 wheels;Shower seat          Prior Functioning/Environment Level of Independence: Independent        Comments: works as a Financial controller List: Decreased activity tolerance;Impaired balance (sitting and/or standing);Pain      OT Treatment/Interventions: Self-care/ADL training;DME and/or AE instruction;Therapeutic activities;Patient/family education;Balance training    OT Goals(Current goals can be found in the care plan section) Acute Rehab OT Goals Patient Stated Goal: reduce pain OT Goal Formulation: With patient Time For Goal Achievement: 01/31/19 Potential to Achieve Goals: Good  OT Frequency: Min 2X/week   Barriers to D/C:            Co-evaluation              AM-PAC OT "6 Clicks" Daily Activity     Outcome Measure Help from another person eating meals?: None Help from another person taking care of personal grooming?: None Help from another person toileting, which includes using toliet, bedpan, or urinal?: None Help from another person bathing (including washing, rinsing, drying)?: None Help from another person to put on and taking off regular upper body clothing?: None Help from another person to put on and taking off regular lower body clothing?: A Little 6 Click Score: 23   End of Session Equipment Utilized During Treatment: Gait belt;Rolling walker Nurse Communication: Mobility status  Activity Tolerance: Patient tolerated treatment well Patient left: in chair;with call bell/phone within reach  OT Visit Diagnosis: Unsteadiness on  feet (R26.81);Pain Pain - Right/Left: Left Pain - part of body: (abdomen (rib fx))                Time: 1010-1033 OT Time Calculation (min): 23 min Charges:  OT General Charges $OT Visit: 1 Visit OT Evaluation $OT Eval Moderate Complexity: 1 Mod OT Treatments $Self Care/Home Management : 8-22 mins  Darrol Jump OTR/L [place] [phone] 01/17/2019, 11:00 AM

## 2019-01-17 NOTE — Progress Notes (Signed)
Physical Therapy Treatment Patient Details Name: Joe Green MRN: [id number] DOB: [birth year] Today's Date: 01/17/2019    History of Present Illness Patient is a 68 y/o male presenting to the ED on 01/15/2019 after fall from ladder with resultant L medial mal fx and a calc avulsion fx, as well as multiple rib fractures. PMH significant of CAD, HTN, OSA, DM.    PT Comments    Pt performed gait training and functional mobility with min guard to supervision.  He is limited due to pain in L rib cage.  HHPT remains appropriate.  He has elevator to get into house and does not need stair training for d/c.  Limited session to short gt trial due to pain.  He is utilizing RW well and has RW at home to use when d/c.     Follow Up Recommendations  Home health PT;Supervision/Assistance - 24 hour     Equipment Recommendations  None recommended by PT    Recommendations for Other Services       Precautions / Restrictions Precautions Precautions: Fall Restrictions Weight Bearing Restrictions: Yes LLE Weight Bearing: Weight bearing as tolerated Other Position/Activity Restrictions: with CAM boot    Mobility  Bed Mobility               General bed mobility comments: Pt seated in recliner on arrival.   Transfers Overall transfer level: Needs assistance Equipment used: Rolling walker (2 wheeled) Transfers: Sit to/from Stand Sit to Stand: Supervision         General transfer comment: Supervision for saftey. VCs for hand placement. used gt belt with pillow underneath to apply pressure to L ribcage.    Ambulation/Gait Ambulation/Gait assistance: Supervision;Min guard Gait Distance (Feet): 20 Feet Assistive device: Rolling walker (2 wheeled) Gait Pattern/deviations: Step-to pattern;Antalgic;Trunk flexed     General Gait Details: Pt intially min guard progressing to supervision.  He is moving well but limited due to pain.     Stairs             Wheelchair Mobility     Modified Rankin (Stroke Patients Only)       Balance Overall balance assessment: Needs assistance   Sitting balance-Leahy Scale: Fair       Standing balance-Leahy Scale: Fair                              Cognition Arousal/Alertness: Awake/alert Behavior During Therapy: WFL for tasks assessed/performed Overall Cognitive Status: Within Functional Limits for tasks assessed                                        Exercises      General Comments        Pertinent Vitals/Pain Pain Assessment: 0-10 Pain Score: 6  Pain Location: L ribs Pain Descriptors / Indicators: Aching;Discomfort;Grimacing;Guarding Pain Intervention(s): Monitored during session;Repositioned    Home Living                      Prior Function            PT Goals (current goals can now be found in the care plan section) Acute Rehab PT Goals Patient Stated Goal: reduce pain Potential to Achieve Goals: Good Progress towards PT goals: Progressing toward goals    Frequency    Min 3X/week  PT Plan Current plan remains appropriate    Co-evaluation              AM-PAC PT "6 Clicks" Mobility   Outcome Measure  Help needed turning from your back to your side while in a flat bed without using bedrails?: None Help needed moving from lying on your back to sitting on the side of a flat bed without using bedrails?: None Help needed moving to and from a bed to a chair (including a wheelchair)?: None Help needed standing up from a chair using your arms (e.g., wheelchair or bedside chair)?: None Help needed to walk in hospital room?: A Little Help needed climbing 3-5 steps with a railing? : A Little 6 Click Score: 22    End of Session Equipment Utilized During Treatment: Gait belt(L CAM boot) Activity Tolerance: Patient limited by pain Patient left: in bed;with call bell/phone within reach;with nursing/sitter in room Nurse Communication: Mobility  status PT Visit Diagnosis: Unsteadiness on feet (R26.81);Other abnormalities of gait and mobility (R26.89);Muscle weakness (generalized) (M62.81)     Time: 5208-0223 PT Time Calculation (min) (ACUTE ONLY): 10 min  Charges:  $Gait Training: 8-22 mins                     Governor Rooks, PTA Acute Rehabilitation Services Pager [phone] Office [phone]     Dante Roudebush Eli Hose 01/17/2019, 3:13 PM

## 2019-01-18 LAB — GLUCOSE, CAPILLARY
Glucose-Capillary: 170 mg/dL — ABNORMAL HIGH (ref 70–99)
Glucose-Capillary: 150 mg/dL — ABNORMAL HIGH (ref 70–99)

## 2019-01-18 MED ORDER — TRAMADOL HCL 50 MG PO TABS: 50.0000 mg | ORAL_TABLET | Freq: Two times a day (BID) | ORAL | 0 refills | Status: DC

## 2019-01-18 MED ORDER — BLOOD GLUCOSE METER KIT: PACK | 0 refills | Status: DC

## 2019-01-18 MED ORDER — VITAMIN D 50 MCG (2000 UT) PO TABS: 2000.0000 [IU] | ORAL_TABLET | Freq: Every day | ORAL | Status: DC

## 2019-01-18 MED ORDER — METHOCARBAMOL 750 MG PO TABS: 750.0000 mg | ORAL_TABLET | Freq: Three times a day (TID) | ORAL | 0 refills | Status: DC

## 2019-01-18 MED ORDER — TRAMADOL HCL 50 MG PO TABS: 100.0000 mg | ORAL_TABLET | Freq: Two times a day (BID) | ORAL | Status: DC

## 2019-01-18 MED ORDER — ACETAMINOPHEN 500 MG PO TABS: 1000.0000 mg | ORAL_TABLET | Freq: Four times a day (QID) | ORAL | 0 refills | Status: AC

## 2019-01-18 MED ORDER — TRAMADOL HCL 50 MG PO TABS
50.0000 mg | ORAL_TABLET | Freq: Two times a day (BID) | ORAL | Status: DC
  Administered 2019-01-18: 50 mg via ORAL
  Filled 2019-01-18: qty 1

## 2019-01-18 MED ORDER — GLIMEPIRIDE 2 MG PO TABS: 2.0000 mg | ORAL_TABLET | Freq: Two times a day (BID) | ORAL | 0 refills | Status: DC

## 2019-01-18 MED ORDER — OXYCODONE HCL 5 MG PO TABS: 5.0000 mg | ORAL_TABLET | ORAL | 0 refills | Status: DC | PRN

## 2019-01-18 NOTE — Progress Notes (Signed)
Pt is ambulating with the walker in the room with cam boot on. Left rib cage pain noted, medicated for pain. Pt is using incentive spirometer. Pt has a walker at home. Discharge instructions given to pt, discharged to home picked up by wife.

## 2019-01-18 NOTE — Progress Notes (Signed)
Occupational Therapy Treatment Patient Details Name: Joe Green MRN: [id number] DOB: 1950/12/28 Today's Date: 01/18/2019    History of present illness Patient is a 68 y/o male presenting to the ED on 01/15/2019 after fall from ladder with resultant L medial mal fx and a calc avulsion fx, as well as multiple rib fractures. PMH significant of CAD, HTN, OSA, DM.   OT comments  Pt. Seen for skilled OT.  Able to complete toileting tasks and standing grooming tasks. Pt. Able to manage drawers and closet doors with no LOB noted during in room ADL tasks. Pt. Required no physical assistance during task completion.  Pt. Moving well with no LOB or safety concerns noted from this therapist asst.  Pt. Eager to return home.    Follow Up Recommendations  No OT follow up    Equipment Recommendations  None recommended by OT    Recommendations for Other Services      Precautions / Restrictions Precautions Precautions: Fall Restrictions LLE Weight Bearing: Weight bearing as tolerated Other Position/Activity Restrictions: with CAM boot       Mobility Bed Mobility               General bed mobility comments: Pt seated in recliner on arrival.   Transfers Overall transfer level: Needs assistance Equipment used: Rolling walker (2 wheeled) Transfers: Sit to/from [employer] Transfers Sit to Stand: Supervision;Modified independent (Device/Increase time)              Balance                                           ADL either performed or assessed with clinical judgement   ADL Overall ADL's : Needs assistance/impaired     Grooming: Supervision/safety;Modified independent;Standing;Oral care                   Toilet Transfer: Supervision/safety;Modified Set designer Details (indicate cue type and reason): (simulated) pt. has also been utilizing the urinal        Tub/Shower Transfer Details (indicate  cue type and reason): pt. has large walk in shower with small ledge and large built in bench.  advised him to check with md/rn for instructions on how to manage LLE during bathing prior to attempting shower stall Functional mobility during ADLs: Supervision/safety;Rolling walker;Modified independent General ADL Comments: had pt. open/close drawers at various heights and open/close doors, to mimic home activities and ensure he is able to manage. no LOB noted. reports he feels comfortable going home and is eager to return home asap.  states ribs are his only real pain factor     Vision       Perception     Praxis      Cognition Arousal/Alertness: Awake/alert Behavior During Therapy: WFL for tasks assessed/performed Overall Cognitive Status: Within Functional Limits for tasks assessed                                          Exercises     Shoulder Instructions       General Comments      Pertinent Vitals/ Pain       Pain Assessment: No/denies pain  Home Living  Prior Functioning/Environment              Frequency  Min 2X/week        Progress Toward Goals  OT Goals(current goals can now be found in the care plan section)  Progress towards OT goals: Progressing toward goals     Plan      Co-evaluation                 AM-PAC OT "6 Clicks" Daily Activity     Outcome Measure   Help from another person eating meals?: None Help from another person taking care of personal grooming?: None Help from another person toileting, which includes using toliet, bedpan, or urinal?: None Help from another person bathing (including washing, rinsing, drying)?: None Help from another person to put on and taking off regular upper body clothing?: None Help from another person to put on and taking off regular lower body clothing?: A Little 6 Click Score: 23    End of Session Equipment Utilized  During Treatment: Rolling walker  OT Visit Diagnosis: Unsteadiness on feet (R26.81);Pain Pain - Right/Left: Left   Activity Tolerance Patient tolerated treatment well   Patient Left in chair;with call bell/phone within reach   Nurse Communication          Time: 0177-9390 OT Time Calculation (min): 10 min  Charges: OT General Charges $OT Visit: 1 Visit OT Treatments $Self Care/Home Management : 8-22 mins  Janice Coffin, COTA/L 01/18/2019, 8:58 AM

## 2019-01-18 NOTE — Progress Notes (Signed)
Pt able to ambulate to BR with front wheel walker, tolerated fair. Pt feels more comfortable and at ease in the recliner. Pt with continued c/o of Rib cage pain rated 8/1. Prn pain meds given (see mar). Follow up pain rated 5/10 and pt able to sleep during the night. Cam boot maintained, and while in bed SCD on the RLE. Pt with request for ice pack to place on L flank side.  Spoke with daughter and spouse,[phone] both have concerns about pt returning home and ability to ambulate or perform ADLS. Per wife Horris Latino she is disabled herself. Charge nurse Rod Holler dicussed options for pt and family.

## 2019-01-18 NOTE — Discharge Instructions (Signed)
RIB FRACTURES  HOME INSTRUCTIONS   1. PAIN CONTROL:  1. Pain is best controlled by a usual combination of three different methods TOGETHER:  i. Ice/Heat ii. Over the counter pain medication iii. Prescription pain medication 2. You may experience some swelling and bruising in area of broken ribs. Ice packs or heating pads (30-60 minutes up to 6 times a day) will help. Use ice for the first few days to help decrease swelling and bruising, then switch to heat to help relax tight/sore spots and speed recovery. Some people prefer to use ice alone, heat alone, alternating between ice & heat. Experiment to what works for you. Swelling and bruising can take several weeks to resolve.  3. It is helpful to take an over-the-counter pain medication regularly for the first few weeks. Choose one of the following that works best for you:  i. Naproxen (Aleve, etc) Two 220mg tabs twice a day ii. Ibuprofen (Advil, etc) Three 200mg tabs four times a day (every meal & bedtime) iii. Acetaminophen (Tylenol, etc) 500-650mg four times a day (every meal & bedtime) 4. A prescription for pain medication (such as oxycodone, hydrocodone, etc) may be given to you upon discharge. Take your pain medication as prescribed.  i. If you are having problems/concerns with the prescription medicine (does not control pain, nausea, vomiting, rash, itching, etc), please call us [phone] to see if we need to switch you to a different pain medicine that will work better for you and/or control your side effect better. ii. If you need a refill on your pain medication, please contact your pharmacy. They will contact our office to request authorization. Prescriptions will not be filled after 5 pm or on week-ends. 1. Avoid getting constipated. When taking pain medications, it is common to experience some constipation. Increasing fluid intake and taking a fiber supplement (such as Metamucil, Citrucel, FiberCon, MiraLax, etc) 1-2 times a day  regularly will usually help prevent this problem from occurring. A mild laxative (prune juice, Milk of Magnesia, MiraLax, etc) should be taken according to package directions if there are no bowel movements after 48 hours.  2. Watch out for diarrhea. If you have many loose bowel movements, simplify your diet to bland foods & liquids for a few days. Stop any stool softeners and decrease your fiber supplement. Switching to mild anti-diarrheal medications (Kayopectate, Pepto Bismol) can help. If this worsens or does not improve, please call us. 3. FOLLOW UP  a. If a follow up appointment is needed one will be scheduled for you. If none is needed with our trauma team, please follow up with your primary care provider within 2-3 weeks from discharge. Please call [facility] at [phone] if you have any questions about follow up.  b. If you have any orthopedic or other injuries you will need to follow up as outlined in your follow up instructions.   WHEN TO CALL US [phone]:  1. Poor pain control 2. Reactions / problems with new medications (rash/itching, nausea, etc)  3. Fever over 101.5 F (38.5 C) 4. Worsening swelling or bruising 5. Worsening pain, productive cough, difficulty breathing or any other concerning symptoms  The clinic staff is available to answer your questions during regular business hours (8:30am-5pm). Please don't hesitate to call and ask to speak to one of our nurses for clinical concerns.  If you have a medical emergency, go to the nearest emergency room or call 911.  A surgeon from [facility] is always on call   at the hospitals   [facility]  [address], [address], [place], [place] [address] ?  MAIN: [phone] ? TOLL FREE: [phone] ?  FAX [phone]  [email or web address]      Information on Rib Fractures  A rib fracture is a break or crack in one of the bones of the ribs. The ribs are long, curved bones that  wrap around your chest and attach to your spine and your breastbone. The ribs protect your heart, lungs, and other organs in the chest. A broken or cracked rib is often painful but is not usually serious. Most rib fractures heal on their own over time. However, rib fractures can be more serious if multiple ribs are broken or if broken ribs move out of place and push against other structures or organs. What are the causes? This condition is caused by:  Repetitive movements with high force, such as pitching a baseball or having severe coughing spells.  A direct blow to the chest, such as a sports injury, a car accident, or a fall.  Cancer that has spread to the bones, which can weaken bones and cause them to break. What are the signs or symptoms? Symptoms of this condition include:  Pain when you breathe in or cough.  Pain when someone presses on the injured area.  Feeling short of breath. How is this diagnosed? This condition is diagnosed with a physical exam and medical history. Imaging tests may also be done, such as:  Chest X-ray.  CT scan.  MRI.  Bone scan.  Chest ultrasound. How is this treated? Treatment for this condition depends on the severity of the fracture. Most rib fractures usually heal on their own in 1-3 months. Sometimes healing takes longer if there is a cough that does not stop or if there are other activities that make the injury worse (aggravating factors). While you heal, you will be given medicines to control the pain. You will also be taught deep breathing exercises. Severe injuries may require hospitalization or surgery. Follow these instructions at home: Managing pain, stiffness, and swelling  If directed, apply ice to the injured area. ? Put ice in a plastic bag. ? Place a towel between your skin and the bag. ? Leave the ice on for 20 minutes, 2-3 times a day.  Take over-the-counter and prescription medicines only as told by your health care  provider. Activity  Avoid a lot of activity and any activities or movements that cause pain. Be careful during activities and avoid bumping the injured rib.  Slowly increase your activity as told by your health care provider. General instructions  Do deep breathing exercises as told by your health care provider. This helps prevent pneumonia, which is a common complication of a broken rib. Your health care provider may instruct you to: ? Take deep breaths several times a day. ? Try to cough several times a day, holding a pillow against the injured area. ? Use a device called incentive spirometer to practice deep breathing several times a day.  Drink enough fluid to keep your urine pale yellow.  Do not wear a rib belt or binder. These restrict breathing, which can lead to pneumonia.  Keep all follow-up visits as told by your health care provider. This is important. Contact a health care provider if:  You have a fever. Get help right away if:  You have difficulty breathing or you are short of breath.  You develop a cough that does   not stop, or you cough up thick or bloody sputum.  You have nausea, vomiting, or pain in your abdomen.  Your pain gets worse and medicine does not help. Summary  A rib fracture is a break or crack in one of the bones of the ribs.  A broken or cracked rib is often painful but is not usually serious.  Most rib fractures heal on their own over time.  Treatment for this condition depends on the severity of the fracture.  Avoid a lot of activity and any activities or movements that cause pain. This information is not intended to replace advice given to you by your health care provider. Make sure you discuss any questions you have with your health care provider. Document Released: 07/31/2005 Document Revised: 10/30/2016 Document Reviewed: 10/30/2016 Elsevier Interactive Patient Education  2019 [place].    Ankle Fracture  The ankle joint is made  up of the lower (distal) sections of your lower leg bones(tibia and fibula) along with a bone in your foot (talus). An ankle fracture is a break in one, two, or all three of these sections of bone. Follow these instructions at home: If you have a splint:  Wear the splint as told by your doctor. Take it off only as told by your doctor.  Loosen the splint if your toes tingle, become numb, or turn cold and blue.  Keep the splint clean.  If the splint is not waterproof: ? Do not let it get wet. ? Cover it with a watertight covering when you take a bath or a shower. If you have a cast:  Do not stick anything inside the cast to scratch your skin. Doing that increases your risk of infection.  Check the skin around the cast every day. Tell your doctor about any concerns.  You may put lotion on dry skin around the edges of the cast. Do not put lotion on the skin underneath the cast.  Keep the cast clean.  If the cast is not waterproof: ? Do not let it get wet. ? Cover it with a watertight covering when you take a bath or a shower. Managing pain, stiffness, and swelling  If directed, put ice on the injured area: ? If you have a removable splint, remove it as told by your doctor. ? Put ice in a plastic bag. ? Place a towel between your skin and the bag. ? Leave the ice on for 20 minutes, 2-3 times a day.  Move your toes often. This prevents stiffness and lessens swelling.  Raise (elevate) the injured area above the level of your heart while you are sitting or lying down. General instructions  Do not use the injured limb to support your body weight until your doctor says that you can. Use crutches as told by your doctor.  Take over-the-counter and prescription medicines only as told by your doctor.  Ask your doctor when it is safe to drive if you have a cast or splint.  Do exercises as told by your doctor.  Do not use any products that contain nicotine or tobacco, such as  cigarettes and e-cigarettes. These can delay bone healing. If you need help quitting, ask your doctor.  Keep all follow-up visits as told by your doctor. This is important. Contact a doctor if:  Your pain or swelling gets worse.  Your pain or swelling does not get better when you rest or take medicine. Get help right away if:  Your cast gets damaged.  You continue to have very bad pain.  You have new pain or swelling.  Your skin or toes below the injured ankle: ? Turn blue or gray. ? Feel cold or numb. ? Lose sensitivity to touch. Summary  An ankle fracture is a break in one, two, or all three of the bones in your lower leg and lower foot.  If you have a splint, wear it as told by your health care provider. Keep it clean and dry.  If you have a cast, do not stick anything inside the cast to scratch your skin. This can cause infection.  Use ice, take medicines, raise your foot, and avoid tobacco and nicotine products. These steps will lessen pain and swelling and speed up healing. This information is not intended to replace advice given to you by your health care provider. Make sure you discuss any questions you have with your health care provider. Document Released: 05/28/2009 Document Revised: 09/04/2016 Document Reviewed: 09/04/2016 Elsevier Interactive Patient Education  2019 [place], Adult  A walking boot is a medical device that holds your foot or ankle in place after an injury or a medical procedure. This helps with healing and prevents further injury. A walking boot is a removable boot-shaped splint. It has a hard, rigid outer frame that limits movement and supports your foot and leg. The inner lining is a layer of padded material. Walking boots usually have adjustable straps to secure them over the foot and leg. Your health care provider may prescribe a walking boot if you can put weight (bear weight) on your injured foot. How much you can walk while  wearing the boot will depend on the type and severity of your injury. Your health care provider will recommend the best boot for you based on your condition. How do I put on my walking boot? There are different types of walking boots. Each type of boot has specific instructions about how to wear it properly. Follow instructions from your health care provider about wearing your boot. In general:  Ask someone to help you put on the boot, if needed.  Sit to put on your boot. Doing this is more comfortable and it helps to prevent falls.  Open up the boot fully. Place your foot into the boot so your heel rests against the back.  Your toes should be supported by the base of the boot. They should not hang over the front edge.  Adjust the straps so the boot fits securely but is not too tight.  Do not bend the hard frame of the boot to get a good fit. What are some tips for walking with a walking boot?  Do not try to walk without wearing the boot unless your health care provider has approved.  Use other assistive walking devices, including crutches and canes, as told by your health care provider.  On your uninjured foot, wear a shoe with a heel that is close to the height of the walking boot.  Be careful when walking on surfaces that are uneven or wet. How can I reduce swelling while using a walking boot?   Rest your injured foot or leg as much as possible.  If directed, apply ice to the injured area: ? Put ice in a plastic bag. ? Place a towel between your skin and the bag. ? Leave the ice on for 20 minutes, 2-3 times a day.  Keep your injured foot or leg raised (elevated) above the level  of your heart whenever able. Try to do this for at least 2?3 hours each day or as told by your health care provider.  If swelling gets worse, loosen the boot and rest and elevate your foot and leg. How should I take care of my skin and foot while using a walking boot?  Wear a long sock to protect your  foot and leg from rubbing inside the boot.  Take off the boot one time each day to check the injured area. Look at your foot, surrounding skin, and leg to make sure there are no sores, rashes, swelling, or wounds. The skin should be a healthy color, not pale or blue.  Try to notice if your walking pattern (gait) in the boot is fairly normal and that you are not walking with a noticeable limp.  Follow instructions from your health care provider about taking care of your incision or wound, if this applies.  Clean and wash the injured area as told by your health care provider.  Gently dry your foot and leg before putting the boot back on. Are there any activity restrictions? Activity restrictions depend on the type and severity of your injury. Follow instructions from your health care provider about limiting activities.  Bathe and shower as told by your health care provider.  Do not do any activities that could make your injury worse.  Do not drive if your affected foot is one that you usually use for driving. When can I remove my walking boot? Always follow specific directions from your health care provider for removing the walking boot. Generally, it is okay to remove your walking boot:  At the end of the day when you are resting or sleeping.  To clean your foot and leg. How should I keep my walking boot clean?  Do not put any part of the boot in a washing machine or dryer.  Do not use chemical cleaning products. These could irritate your skin, especially if you have a wound or an incision.  Do not soak the liner of the boot.  Use a washcloth with mild soap and water to clean the frame and the liner of the boot by hand.  Allow the boot to air-dry completely before you put it back on your foot. Contact a health care provider if:  The boot is cracked or damaged.  The boot does not fit properly.  Your foot or leg hurts.  You have a rash, sore, or open sore (ulcer) on your foot  or leg.  The skin on your foot or leg is pale.  You have a wound or incision on the foot and it is getting worse.  Your skin becomes painful, red, or irritated.  Your swelling does not get better or it gets worse. Get help right away if:  You cannot feel your foot or leg (have numbness).  You cannot feel a pulse at the top of your foot, where your foot and ankle meet.  Your skin on the foot or leg is cold, blue, or gray. Summary  A walking boot holds your foot or ankle in place after an injury or a medical procedure.  There are different types of walking boots. Follow the specific instructions about how to correctly wear the boot that you have.  Ask someone to help you put on the boot, if needed.  It is important to check your skin and foot every day. Call your health care provider if you notice a rash or sore  on your foot or leg. This information is not intended to replace advice given to you by your health care provider. Make sure you discuss any questions you have with your health care provider. Document Released: 12/15/2014 Document Revised: 09/07/2016 Document Reviewed: 09/07/2016 Elsevier Interactive Patient Education  2019 [place].   Diabetes Basics  Diabetes (diabetes mellitus) is a long-term (chronic) disease. It occurs when the body does not properly use sugar (glucose) that is released from food after you eat. Diabetes may be caused by one or both of these problems:  Your pancreas does not make enough of a hormone called insulin.  Your body does not react in a normal way to insulin that it makes. Insulin lets sugars (glucose) go into cells in your body. This gives you energy. If you have diabetes, sugars cannot get into cells. This causes high blood sugar (hyperglycemia). Follow these instructions at home: How is diabetes treated? You may need to take insulin or other diabetes medicines daily to keep your blood sugar in balance. Take your diabetes medicines  every day as told by your doctor. List your diabetes medicines here: Diabetes medicines  Name of medicine: ______________________________ ? Amount (dose): _______________ Time (a.m./p.m.): _______________ Notes: ___________________________________  Name of medicine: ______________________________ ? Amount (dose): _______________ Time (a.m./p.m.): _______________ Notes: ___________________________________  Name of medicine: ______________________________ ? Amount (dose): _______________ Time (a.m./p.m.): _______________ Notes: ___________________________________ If you use insulin, you will learn how to give yourself insulin by injection. You may need to adjust the amount based on the food that you eat. List the types of insulin you use here: Insulin  Insulin type: ______________________________ ? Amount (dose): _______________ Time (a.m./p.m.): _______________ Notes: ___________________________________  Insulin type: ______________________________ ? Amount (dose): _______________ Time (a.m./p.m.): _______________ Notes: ___________________________________  Insulin type: ______________________________ ? Amount (dose): _______________ Time (a.m./p.m.): _______________ Notes: ___________________________________  Insulin type: ______________________________ ? Amount (dose): _______________ Time (a.m./p.m.): _______________ Notes: ___________________________________  Insulin type: ______________________________ ? Amount (dose): _______________ Time (a.m./p.m.): _______________ Notes: ___________________________________ How do I manage my blood sugar?  Check your blood sugar levels using a blood glucose monitor as directed by your doctor. Your doctor will set treatment goals for you. Generally, you should have these blood sugar levels:  Before meals (preprandial): 80-130 mg/dL (4.4-7.2 mmol/L).  After meals (postprandial): below 180 mg/dL (10 mmol/L).  A1c level: less than 7%. Write  down the times that you will check your blood sugar levels: Blood sugar checks  Time: _______________ Notes: ___________________________________  Time: _______________ Notes: ___________________________________  Time: _______________ Notes: ___________________________________  Time: _______________ Notes: ___________________________________  Time: _______________ Notes: ___________________________________  Time: _______________ Notes: ___________________________________  What do I need to know about low blood sugar? Low blood sugar is called hypoglycemia. This is when blood sugar is at or below 70 mg/dL (3.9 mmol/L). Symptoms may include:  Feeling: ? Hungry. ? Worried or nervous (anxious). ? Sweaty and clammy. ? Confused. ? Dizzy. ? Sleepy. ? Sick to your stomach (nauseous).  Having: ? A fast heartbeat. ? A headache. ? A change in your vision. ? Tingling or no feeling (numbness) around the mouth, lips, or tongue. ? Jerky movements that you cannot control (seizure).  Having trouble with: ? Moving (coordination). ? Sleeping. ? Passing out (fainting). ? Getting upset easily (irritability). Treating low blood sugar To treat low blood sugar, eat or drink something sugary right away. If you can think clearly and swallow safely, follow the 15:15 rule:  Take 15 grams of a fast-acting carb (carbohydrate). Talk with your doctor  about how much you should take.  Some fast-acting carbs are: ? Sugar tablets (glucose pills). Take 3-4 glucose pills. ? 6-8 pieces of hard candy. ? 4-6 oz (120-150 mL) of fruit juice. ? 4-6 oz (120-150 mL) of regular (not diet) soda. ? 1 Tbsp (15 mL) honey or sugar.  Check your blood sugar 15 minutes after you take the carb.  If your blood sugar is still at or below 70 mg/dL (3.9 mmol/L), take 15 grams of a carb again.  If your blood sugar does not go above 70 mg/dL (3.9 mmol/L) after 3 tries, get help right away.  After your blood sugar goes  back to normal, eat a meal or a snack within 1 hour. Treating very low blood sugar If your blood sugar is at or below 54 mg/dL (3 mmol/L), you have very low blood sugar (severe hypoglycemia). This is an emergency. Do not wait to see if the symptoms will go away. Get medical help right away. Call your local emergency services (911 in the [place] Do not drive yourself to the hospital. Questions to ask your health care provider  Do I need to meet with a diabetes educator?  What equipment will I need to care for myself at home?  What diabetes medicines do I need? When should I take them?  How often do I need to check my blood sugar?  What number can I call if I have questions?  When is my next doctor's visit?  Where can I find a support group for people with diabetes? Where to find more information  American Diabetes Association: www.diabetes.org  American Association of Diabetes Educators: www.diabeteseducator.org/patient-resources Contact a doctor if:  Your blood sugar is at or above 240 mg/dL (13.3 mmol/L) for 2 days in a row.  You have been sick or have had a fever for 2 days or more, and you are not getting better.  You have any of these problems for more than 6 hours: ? You cannot eat or drink. ? You feel sick to your stomach (nauseous). ? You throw up (vomit). ? You have watery poop (diarrhea). Get help right away if:  Your blood sugar is lower than 54 mg/dL (3 mmol/L).  You get confused.  You have trouble: ? Thinking clearly. ? Breathing. Summary  Diabetes (diabetes mellitus) is a long-term (chronic) disease. It occurs when the body does not properly use sugar (glucose) that is released from food after digestion.  Take insulin and diabetes medicines as told.  Check your blood sugar every day, as often as told.  Keep all follow-up visits as told by your doctor. This is important. This information is not intended to replace advice given to you by your health care  provider. Make sure you discuss any questions you have with your health care provider. Document Released: 11/02/2017 Document Revised: 01/21/2018 Document Reviewed: 11/02/2017 Elsevier Interactive Patient Education  2019 [employer].

## 2019-01-18 NOTE — Discharge Summary (Signed)
[facility]/Trauma Discharge Summary   Patient ID: Joe Green MRN: [id number] DOB/AGE: 1950-12-25 68 y.o.  Admit date: 01/15/2019 Discharge date: 01/18/2019  Admitting Diagnosis: Fall Left rib FX 6-8 Left medial malleolus FX  Left calcaneous FX DM Tobacco abuse HTN CAD  Discharge Diagnosis Patient Active Problem List   Diagnosis Date Noted  . Closed fracture of left ankle   . Closed nondisplaced avulsion fracture of tuberosity of left calcaneus   . Rib fractures 01/15/2019  . Overweight (BMI 25.0-29.9) 12/30/2017  . Aortic atherosclerosis ([place]) 12/30/2017  . Acute medial meniscal tear, right, initial encounter 06/25/2017  . Acute medial meniscal tear, left, initial encounter 06/25/2017  . 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 Chondromalacia, right knee 06/25/2017  . Elevated PSA 11/18/2015  . RLS (restless legs syndrome) 10/05/2014  . COPD (chronic obstructive pulmonary disease) ([place]) 10/05/2014  . BPH (benign prostatic hyperplasia) 10/05/2014  . Type 2 diabetes mellitus ([place]) 06/05/2014  . Vitamin D deficiency 04/01/2014  . Medication management 04/01/2014  . Smoking 07/21/2013  . Hypertension   . OSA and COPD overlap syndrome ([place])   . Hyperlipidemia associated with type 2 diabetes mellitus ([place]) 04/03/2011  . CAD (coronary artery disease) 04/03/2011    Consultants orthopedics  Imaging: No results found.  Procedures none  HPI: The patient is a 68 year old white male who fell 6 feet from a ladder while installing a gazebo. No loc. No hypotension. Complains of left ankle and rib pain  Hospital Course:  Workup showed Left rib FX 6-8, Left medial malleolus FX, and Left calcaneous FX.  Patient was admitted for pain control and orthopedics was consulted. Orthopedics recommended WBAT in CAM boot and follow up in the office in 2 weeks. Diabetes coordinator saw pt who recommended adding Glimepiride to diabetes control regiment.  Tobacco cessation  was encouraged. Pt instructed to use IS at home. Pt worked with PT who recommended [place]. Pt states his wife is not in a wheelchair and she will be able to help him at home. He feels comfortable with discharge today.   On 06/06, the patient was tolerating diet, ambulating well, pain well controlled, vital signs stable, and felt stable for discharge home.  Patient will follow up as outlined below and knows to call with questions or concerns.     Patient was discharged in good condition.  The [place] Substance controlled database was reviewed prior to prescribing narcotic pain medication to this patient.  Physical Exam: General:  Alert, NAD, pleasant, cooperative Cardio: RRR, S1 & S2 normal, no murmur, rubs, gallops Resp: rate and effort normal, mildly decreased breath sounds L base, no wheezes, rales, rhonchi Skin: warm and dry, no rashes noted Neuro: no gross motor or sensory deficits Extremities: LLE in CAM boot, wiggles toes b/l  Allergies as of 01/18/2019   No Known Allergies     Medication List    STOP taking these medications   albuterol 108 (90 Base) MCG/ACT inhaler Commonly known as:  Ventolin HFA   dapagliflozin propanediol 10 MG Tabs tablet Commonly known as:  [place]   promethazine-dextromethorphan 6.25-15 MG/5ML syrup Commonly known as:  PROMETHAZINE-DM     TAKE these medications   acetaminophen 500 MG tablet Commonly known as:  TYLENOL Take 2 tablets (1,000 mg total) by mouth every 6 (six) hours.   aspirin EC 81 MG tablet Take 1 tablet (81 mg total) by mouth daily.   blood glucose meter kit and supplies Dispense based on patient and insurance  preference. Use up to four times daily as directed. (FOR ICD-9 250.00, 250.01). What changed:  Another medication with the same name was added. Make sure you understand how and when to take each.   blood glucose meter kit and supplies Dispense based on patient and insurance preference. Use up to four times daily as  directed. (FOR ICD-10 E10.9, E11.9). What changed:  You were already taking a medication with the same name, and this prescription was added. Make sure you understand how and when to take each.   Breo Ellipta 100-25 MCG/INH Aepb Generic drug:  fluticasone furoate-vilanterol INHALE 1 PUFF BY MOUTH ONCE DAILY. RINSE MOUTH WITH WATER AFTER EACH USE. What changed:  See the new instructions.   cetirizine 10 MG tablet Commonly known as:  ZYRTEC Take 10 mg by mouth as needed for allergies.   doxazosin 8 MG tablet Commonly known as:  CARDURA Take 1 tablet (8 mg total) by mouth daily.   fexofenadine 60 MG tablet Commonly known as:  ALLEGRA Take 60 mg by mouth daily as needed for allergies or rhinitis.   finasteride 5 MG tablet Commonly known as:  Proscar Take 1 tablet (5 mg total) by mouth daily.   glimepiride 2 MG tablet Commonly known as:  AMARYL Take 1 tablet (2 mg total) by mouth 2 (two) times daily with a meal.   glucose blood test strip Test blood sugars once daily.Dx: E11.9   metFORMIN 1000 MG tablet Commonly known as:  GLUCOPHAGE Take 1 tablet (1,000 mg total) by mouth 2 (two) times daily.   methocarbamol 750 MG tablet Commonly known as:  ROBAXIN Take 1 tablet (750 mg total) by mouth 3 (three) times daily.   oxyCODONE 5 MG immediate release tablet Commonly known as:  Oxy IR/ROXICODONE Take 1-2 tablets (5-10 mg total) by mouth every 4 (four) hours as needed for moderate pain.   ramipril 5 MG capsule Commonly known as:  ALTACE Take 1 capsule by mouth once daily   rosuvastatin 20 MG tablet Commonly known as:  CRESTOR TAKE 1 TABLET BY MOUTH ONCE DAILY   traMADol 50 MG tablet Commonly known as:  ULTRAM Take 1 tablet (50 mg total) by mouth every 12 (twelve) hours.   Vitamin D 50 MCG (2000 UT) tablet Take 1 tablet (2,000 Units total) by mouth daily. What changed:    medication strength  how much to take        Follow-up Information    Leandrew Koyanagi, MD.  Schedule an appointment as soon as possible for a visit in 2 week(s).   Specialty:  Orthopedic Surgery Contact information: [place] [address] [phone]        [name], MD Follow up in 1 week(s).   Specialty:  Internal Medicine Why:  see PCP in 1 week for diabetes follow up and as needed for rib fractures Contact information: [address] [place] [address] [phone]        [facility]. Call.   Why:  as needed with questions or concerns Contact information: [address] [address] [phone]          Signed: [facility] 01/18/2019, 10:05 AM Pager: [phone] Consults: [phone] Mon-Fri 7:00 am-4:30 pm Sat-Sun 7:00 am-11:30 am

## 2019-01-18 NOTE — Plan of Care (Signed)
  Problem: Activity: Goal: Risk for activity intolerance will decrease 01/18/2019 0323 by Josepha Pigg, RN Outcome: Progressing 01/18/2019 0322 by Josepha Pigg, RN Outcome: Progressing   Problem: Nutrition: Goal: Adequate nutrition will be maintained 01/18/2019 0323 by Josepha Pigg, RN Outcome: Progressing 01/18/2019 0322 by Josepha Pigg, RN Outcome: Progressing   Problem: Coping: Goal: Level of anxiety will decrease 01/18/2019 0323 by Josepha Pigg, RN Outcome: Progressing 01/18/2019 0322 by Josepha Pigg, RN Outcome: Progressing   Problem: Elimination: Goal: Will not experience complications related to bowel motility 01/18/2019 0323 by Josepha Pigg, RN Outcome: Progressing 01/18/2019 0322 by Josepha Pigg, RN Outcome: Progressing

## 2019-01-18 NOTE — Progress Notes (Signed)
Patients wife called this evening. She is disabled and is concerned about being able to take care of him if he is discharged Saturday. She needs a wheelchair and her family will be available to help on Sunday. She would like to speak to his MD.

## 2019-01-18 NOTE — Plan of Care (Deleted)
  Problem: Activity: Goal: Risk for activity intolerance will decrease Outcome: Progressing   Problem: Coping: Goal: Level of anxiety will decrease Outcome: Progressing   Problem: Nutrition: Goal: Adequate nutrition will be maintained Outcome: Progressing   

## 2019-01-20 ENCOUNTER — Telehealth: Payer: Self-pay | Admitting: *Deleted

## 2019-01-20 NOTE — Telephone Encounter (Signed)
Called patient on 01/20/2019 , 12:25 PM in an attempt to reach the patient for a hospital follow up.   Admit date: 01/15/19 Discharge: 01/18/19   He does not have any questions or concerns about medications from the hospital admission. The patient's medications were reviewed over the phone, they were counseled to bring in all current medications to the hospital follow up visit. The patient has a telephone visit scheduled with [name], NP on 01/23/2019. He states he will schedule a follow up visit with Dr Erlinda Hong.  I advised the patient to call if any questions or concerns arise about the hospital admission or medications    Home health was not started in the hospital.  All questions were answered and a follow up appointment was made.   Prior to Admission medications   Medication Sig Start Date End Date Taking? Authorizing Provider  acetaminophen (TYLENOL) 500 MG tablet Take 2 tablets (1,000 mg total) by mouth every 6 (six) hours. 01/18/19   Kalman Drape, PA  aspirin EC 81 MG tablet Take 1 tablet (81 mg total) by mouth daily. 11/17/16   Eileen Stanford, PA-C  blood glucose meter kit and supplies Dispense based on patient and insurance preference. Use up to four times daily as directed. (FOR ICD-9 250.00, 250.01). 06/25/17   Vicie Mutters, PA-C  blood glucose meter kit and supplies Dispense based on patient and insurance preference. Use up to four times daily as directed. (FOR ICD-10 E10.9, E11.9). 01/18/19   Focht, Jessica L, PA  BREO ELLIPTA 100-25 MCG/INH AEPB INHALE 1 PUFF BY MOUTH ONCE DAILY. RINSE MOUTH WITH WATER AFTER EACH USE. Patient taking differently: Inhale 1 puff into the lungs daily. Rinse mouth with water after each use 12/11/18   [name], NP  cetirizine (ZYRTEC) 10 MG tablet Take 10 mg by mouth as needed for allergies.     [provider]  Cholecalciferol (VITAMIN D) 50 MCG (2000 UT) tablet Take 1 tablet (2,000 Units total) by mouth daily. 01/18/19   Focht, Fraser Din,  PA  doxazosin (CARDURA) 8 MG tablet Take 1 tablet (8 mg total) by mouth daily. 05/17/18   Vicie Mutters, PA-C  fexofenadine (ALLEGRA) 60 MG tablet Take 60 mg by mouth daily as needed for allergies or rhinitis.    [provider]  finasteride (PROSCAR) 5 MG tablet Take 1 tablet (5 mg total) by mouth daily. 01/01/18   [name], NP  glimepiride (AMARYL) 2 MG tablet Take 1 tablet (2 mg total) by mouth 2 (two) times daily with a meal. 01/18/19   Focht, Jessica L, PA  glucose blood test strip Test blood sugars once daily.Dx: E11.9 03/02/17   Vicie Mutters, PA-C  metFORMIN (GLUCOPHAGE) 1000 MG tablet Take 1 tablet (1,000 mg total) by mouth 2 (two) times daily. 07/25/18   Vicie Mutters, PA-C  methocarbamol (ROBAXIN) 750 MG tablet Take 1 tablet (750 mg total) by mouth 3 (three) times daily. 01/18/19   Focht, Fraser Din, PA  oxyCODONE (OXY IR/ROXICODONE) 5 MG immediate release tablet Take 1-2 tablets (5-10 mg total) by mouth every 4 (four) hours as needed for moderate pain. 01/18/19   Kalman Drape, PA  ramipril (ALTACE) 5 MG capsule Take 1 capsule by mouth once daily 12/10/18   [name], MD  rosuvastatin (CRESTOR) 20 MG tablet TAKE 1 TABLET BY MOUTH ONCE DAILY Patient taking differently: Take 20 mg by mouth daily.  07/07/18   [name], MD  traMADol Veatrice Bourbon) 50 MG tablet Take  1 tablet (50 mg total) by mouth every 12 (twelve) hours. 01/18/19   Kalman Drape, PA

## 2019-01-22 NOTE — Progress Notes (Signed)
Virtual Visit via Telephone Note  I connected with Joe Green on 01/23/19 at  1:30 PM EDT by telephone and verified that I am speaking with the correct person using two identifiers.  Location: Patient: home Provider: [place] office   I discussed the limitations, risks, security and privacy concerns of performing an evaluation and management service by telephone and the availability of in person appointments. I also discussed with the patient that there may be a patient responsible charge related to this service. The patient expressed understanding and agreed to proceed.   I discussed the assessment and treatment plan with the patient. The patient was provided an opportunity to ask questions and all were answered. The patient agreed with the plan and demonstrated an understanding of the instructions.   The patient was advised to call back or seek an in-person evaluation if the symptoms worsen or if the condition fails to improve as anticipated.  I provided 28 minutes of non-face-to-face time during this encounter.   Izora Ribas, NP   Hospital follow up  Assessment and Plan: Hospital visit follow up for:   Closed fracture of multiple ribs of left side with routine healing, subsequent encounter Follow up ortho as scheduled Refill cough medication; bracing with cough reviewed -     benzonatate (TESSALON) 200 MG capsule; Take 1 capsule (200 mg total) by mouth 3 (three) times daily as needed. For cough.  Closed nondisplaced avulsion fracture of tuberosity of left calcaneus with routine healing, subsequent encounter Follow up ortho; contact ortho for pain medication refill Recommended take tylenol 1000 mg 3-4 times daily  Essential hypertension Find BP cuff and start monitoring at home Monitor blood pressure at home; call if consistently over 130/80 Continue DASH diet.   Reminder to go to the ER if any CP, SOB, nausea, dizziness, severe HA, changes vision/speech, left arm  numbness and tingling and jaw pain.  Closed fracture of left ankle with routine healing, subsequent encounter Follow up ortho  Type 2 diabetes mellitus with other circulatory complication, without long-term current use of insulin ([place]) Start amaryl 2 mg with breakfast; start checking fasting glucose; if persistently above 130 add second dose with dinner Education: Reviewed 'ABCs' of diabetes management (respective goals in parentheses):  A1C (<7), blood pressure (<130/80), and cholesterol (LDL <70) Eye Exam yearly and Dental Exam every 6 months. - start glucose log and bring to next appointment  - given instructions for hypoglycemia management   Smoking Quit smoking, doing well   All medications were reviewed with patient and family and fully reconciled. All questions answered fully, and patient and family members were encouraged to call the office with any further questions or concerns. Discussed goal to avoid readmission related to this diagnosis.   There are no discontinued medications.  Over 40 minutes of exam, counseling, chart review, and complex, high/moderate level critical decision making was performed this visit.   Future Appointments  Date Time Provider [place]  01/28/2019 10:15 AM Leandrew Koyanagi, MD [facility] None  03/10/2019 10:00 AM [name], NP [facility] None    HPI 68 y.o.male presents for follow up for transition from recent hospitalization or SNIF stay. Admit date to the hospital was 01/15/19, patient was discharged from the hospital on 01/18/19 and our clinical staff contacted the office the day after discharge to set up a follow up appointment. The discharge summary, medications, and diagnostic test results were reviewed before meeting with the patient. The patient was admitted for:  Admit date: 01/15/2019 Discharge date: 01/18/2019  Admitting Diagnosis: Fall Left rib FX 6-8 Left medial malleolus FX  Left calcaneous FX DM Tobacco  abuse HTN CAD  HPI: The patient is a 68 year old white male who fell 6 feet from a ladder while installing a gazebo. No loc. No hypotension. Complains of left ankle and rib pain  Hospital Course:  Workup showed Left rib FX 6-8, Left medial malleolus FX, and Left calcaneous FX.  Patient was admitted for pain control and orthopedics was consulted. Orthopedics recommended WBAT in CAM boot and follow up in the office in 2 weeks. Diabetes coordinator saw pt who recommended adding Glimepiride to diabetes control regiment.  Tobacco cessation was encouraged. Pt instructed to use IS at home. Pt worked with PT who recommended [facility]. Pt states his wife is not in a wheelchair and she will be able to help him at home. He feels comfortable with discharge today.   On 06/06, the patient was tolerating diet, ambulating well, pain well controlled, vital signs stable, and felt stable for discharge home.  Patient will follow up as outlined below and knows to call with questions or concerns.     Patient was discharged in good condition.  The [place] Substance controlled database was reviewed prior to prescribing narcotic pain medication to this patient.  ---------------------------------------------------  He reports persistent pain, worst in foot, 7-8/10, rib with minimal pain When having pain, will take 1000 mg of tylenol, may improve down to 6/10 Then will take 1 tab oxycodone - will improve to 3/10 - will last ~6 hours or so taking 1 tab of tramadol 50 mg at night - wakes up 3 time sper night   He is taking senokot-S - and having good BMs  He has a BP cuff but unsure where it is, has been well controlled recently.  Marland Kitchen He denies chest pain, shortness of breath, dizziness.   He has not been working on diet and exercise for T2 diabetes (has been on metformin 1000 mg BID, amaryl 2 mg BID was added during hospitalization, didn't realize needed to continue and hasn't been taking), and denies  hypoglycemia , increased appetite, nausea, paresthesia of the feet, polydipsia, polyuria, visual disturbances and vomiting. He just got a meter but hasn't started checking yet, Last A1C in the office was:  Lab Results  Component Value Date   HGBA1C 9.1 (H) 01/16/2019   he quit smoking about 1 month ago, doing well.    Home health is not involved.   Images while in the hospital: Dg Ribs Unilateral W/chest Left  Result Date: 01/15/2019 CLINICAL DATA:  Pain status post fall EXAM: LEFT RIBS AND CHEST - 3+ VIEW COMPARISON:  Chest x-ray dated 11/18/2015. FINDINGS: The lung volumes are low. Heart size is mildly enlarged. There appear to be small bilateral pleural effusions. There are hazy bilateral airspace opacities. There is no pneumothorax. There are multiple minimally displaced fractures involving the sixth through eighth ribs posteriorly on the left. IMPRESSION: 1. Acute minimally displaced fracture involving the sixth through eighth ribs posteriorly on the left. 2. No pneumothorax. 3. Low lung volumes with small bilateral pleural effusions and hazy airspace opacities concerning for volume overload with developing pulmonary edema. An atypical infectious process can have a similar appearance. Electronically Signed   By: Constance Holster M.D.   On: 01/15/2019 19:10   Dg Ankle Complete Left  Result Date: 01/15/2019 CLINICAL DATA:  Pain EXAM: LEFT ANKLE COMPLETE - 3+ VIEW COMPARISON:  None. FINDINGS: There is an acute fracture of the medial malleolus with surrounding soft tissue swelling. There is an old healed fracture of the distal fibular diaphysis. There is a moderate-sized plantar calcaneal spur. There is an avulsion fracture fragment of the calcaneus visualized on the frontal view. IMPRESSION: 1. Acute nondisplaced a fracture involving the medial malleolus with surrounding soft tissue swelling. 2. Acute avulsion fracture of the calcaneus likely at the attachment of the extensor digitorum brevis  muscle. This is best visualized on the frontal view only. Electronically Signed   By: Constance Holster M.D.   On: 01/15/2019 19:07   Ct Chest Wo Contrast  Result Date: 01/15/2019 CLINICAL DATA:  Rib fracture suspected status post trauma. EXAM: CT CHEST WITHOUT CONTRAST TECHNIQUE: Multidetector CT imaging of the chest was performed following the standard protocol without IV contrast. COMPARISON:  CT chest dated 12/15/2016. FINDINGS: Cardiovascular: Heart size is normal. There are coronary artery calcifications. There is no evidence of a thoracic aortic aneurysm. Atherosclerotic changes are noted of the thoracic aorta. Mediastinum/Nodes: No enlarged mediastinal or axillary lymph nodes. Thyroid gland, trachea, and esophagus demonstrate no significant findings. Lungs/Pleura: Emphysematous changes are noted at the lung apices. Bilateral airspace consolidation is visualized. Evaluation is limited by significant motion artifact. The trachea is unremarkable. No significant pleural effusion. Upper Abdomen: No acute abnormality. Musculoskeletal: Again identified are mildly displaced fractures involving the sixth through eighth ribs posteriorly on the left. IMPRESSION: 1. Acute minimally displaced fractures involving the sixth through eighth ribs posteriorly on the left. There is no pneumothorax. 2. Symmetric bibasilar airspace opacities concerning for aspiration or atelectasis. 3. Emphysematous changes bilaterally. Aortic Atherosclerosis (ICD10-I70.0) and Emphysema (ICD10-J43.9). Electronically Signed   By: Constance Holster M.D.   On: 01/15/2019 21:10     Current Outpatient Medications (Endocrine & Metabolic):  .  glimepiride (AMARYL) 2 MG tablet, Take 1 tablet (2 mg total) by mouth 2 (two) times daily with a meal. .  metFORMIN (GLUCOPHAGE) 1000 MG tablet, Take 1 tablet (1,000 mg total) by mouth 2 (two) times daily.  Current Outpatient Medications (Cardiovascular):  .  doxazosin (CARDURA) 8 MG tablet, Take 1  tablet (8 mg total) by mouth daily. .  ramipril (ALTACE) 5 MG capsule, Take 1 capsule by mouth once daily .  rosuvastatin (CRESTOR) 20 MG tablet, TAKE 1 TABLET BY MOUTH ONCE DAILY (Patient taking differently: Take 20 mg by mouth daily. )  Current Outpatient Medications (Respiratory):  Marland Kitchen  BREO ELLIPTA 100-25 MCG/INH AEPB, INHALE 1 PUFF BY MOUTH ONCE DAILY. RINSE MOUTH WITH WATER AFTER EACH USE. (Patient taking differently: Inhale 1 puff into the lungs daily. Rinse mouth with water after each use) .  cetirizine (ZYRTEC) 10 MG tablet, Take 10 mg by mouth as needed for allergies.  .  fexofenadine (ALLEGRA) 60 MG tablet, Take 60 mg by mouth daily as needed for allergies or rhinitis. Marland Kitchen  benzonatate (TESSALON) 200 MG capsule, Take 1 capsule (200 mg total) by mouth 3 (three) times daily as needed. For cough.  Current Outpatient Medications (Analgesics):  .  acetaminophen (TYLENOL) 500 MG tablet, Take 2 tablets (1,000 mg total) by mouth every 6 (six) hours. Marland Kitchen  aspirin EC 81 MG tablet, Take 1 tablet (81 mg total) by mouth daily. Marland Kitchen  oxyCODONE (OXY IR/ROXICODONE) 5 MG immediate release tablet, Take 1-2 tablets (5-10 mg total) by mouth every 4 (four) hours as needed for moderate pain. .  traMADol (ULTRAM) 50 MG tablet, Take 1 tablet (50 mg total) by mouth every  12 (twelve) hours.   Current Outpatient Medications (Other):  .  blood glucose meter kit and supplies, Dispense based on patient and insurance preference. Use up to four times daily as directed. (FOR ICD-9 250.00, 250.01). .  blood glucose meter kit and supplies, Dispense based on patient and insurance preference. Use up to four times daily as directed. (FOR ICD-10 E10.9, E11.9). Marland Kitchen  Cholecalciferol (VITAMIN D) 50 MCG (2000 UT) tablet, Take 1 tablet (2,000 Units total) by mouth daily. (Patient taking differently: Take 10,000 Units by mouth daily. ) .  finasteride (PROSCAR) 5 MG tablet, Take 1 tablet (5 mg total) by mouth daily. Marland Kitchen  glucose blood test  strip, Test blood sugars once daily.Dx: E11.9 .  methocarbamol (ROBAXIN) 750 MG tablet, Take 1 tablet (750 mg total) by mouth 3 (three) times daily. Marland Kitchen  senna-docusate (SENOKOT-S) 8.6-50 MG tablet, Take 1 tablet by mouth daily.  Past Medical History:  Diagnosis Date  . CAD (coronary artery disease)    a. s/p stent to RCA 2005; b. LHC 2006 after Nuc with inf ischemia: patent RCA stent, D1 80%, LAD 60% => med Rx;  c. LHC 04/18/11: Proximal LAD 60% (unchanged from previous catheterization), distal LAD 65% (small caliber), Dx 60%, proximal CFX 20%, mid CFX 50%, OM1 30%, proximal RCA 20%, mid RCA stent patent, EF 65%.=> med Rx  . GERD (gastroesophageal reflux disease)   . HLD (hyperlipidemia)   . Hypertension   . Obesity   . OSA (obstructive sleep apnea)    has a cpap-does not use-has a special mouthpiece he wears at night  . Type II or unspecified type diabetes mellitus without mention of complication, not stated as uncontrolled   . Vitamin D deficiency   . Wears glasses      No Known Allergies  ROS: all negative except above.   Physical Exam: There were no vitals filed for this visit. There were no vitals taken for this visit.  General : Well sounding patient in no apparent distress HEENT: no hoarseness, no cough for duration of visit Lungs: speaks in complete sentences, no audible wheezing, no apparent distress Neurological: alert, oriented x 3 Psychiatric: pleasant, judgement appropriate      Izora Ribas, NP 5:26 PM [facility]

## 2019-01-23 ENCOUNTER — Other Ambulatory Visit: Payer: Self-pay

## 2019-01-23 ENCOUNTER — Ambulatory Visit: Payer: BLUE CROSS/BLUE SHIELD | Admitting: Adult Health

## 2019-01-23 ENCOUNTER — Encounter: Payer: Self-pay | Admitting: Adult Health

## 2019-01-23 ENCOUNTER — Ambulatory Visit: Payer: BC Managed Care – PPO | Admitting: Adult Health

## 2019-01-23 DIAGNOSIS — I10 Essential (primary) hypertension: Secondary | ICD-10-CM

## 2019-01-23 DIAGNOSIS — E1159 Type 2 diabetes mellitus with other circulatory complications: Secondary | ICD-10-CM

## 2019-01-23 DIAGNOSIS — S2242XS Multiple fractures of ribs, left side, sequela: Secondary | ICD-10-CM

## 2019-01-23 DIAGNOSIS — S82892D Other fracture of left lower leg, subsequent encounter for closed fracture with routine healing: Secondary | ICD-10-CM

## 2019-01-23 DIAGNOSIS — S92035D Nondisplaced avulsion fracture of tuberosity of left calcaneus, subsequent encounter for fracture with routine healing: Secondary | ICD-10-CM | POA: Diagnosis not present

## 2019-01-23 DIAGNOSIS — S2242XD Multiple fractures of ribs, left side, subsequent encounter for fracture with routine healing: Secondary | ICD-10-CM

## 2019-01-23 DIAGNOSIS — F17200 Nicotine dependence, unspecified, uncomplicated: Secondary | ICD-10-CM

## 2019-01-23 MED ORDER — BENZONATATE 200 MG PO CAPS: 200.0000 mg | ORAL_CAPSULE | Freq: Three times a day (TID) | ORAL | 1 refills | Status: DC | PRN

## 2019-01-28 ENCOUNTER — Encounter: Payer: Self-pay | Admitting: Orthopaedic Surgery

## 2019-01-28 ENCOUNTER — Ambulatory Visit: Payer: Self-pay

## 2019-01-28 ENCOUNTER — Other Ambulatory Visit: Payer: Self-pay

## 2019-01-28 ENCOUNTER — Ambulatory Visit (INDEPENDENT_AMBULATORY_CARE_PROVIDER_SITE_OTHER): Payer: BC Managed Care – PPO | Admitting: Orthopaedic Surgery

## 2019-01-28 DIAGNOSIS — M25572 Pain in left ankle and joints of left foot: Secondary | ICD-10-CM | POA: Diagnosis not present

## 2019-01-28 DIAGNOSIS — I25119 Atherosclerotic heart disease of native coronary artery with unspecified angina pectoris: Secondary | ICD-10-CM

## 2019-01-28 NOTE — Progress Notes (Signed)
Office Visit Note   Patient: Joe Green           Date of Birth: 01-19-1951           MRN: [id number] Visit Date: 01/28/2019              Requested by: [name], [address] [place],  [place] [id number] PCP: [name], MD   Assessment & Plan: Visit Diagnoses:  1. Pain in left ankle and joints of left foot     Plan: Impression is stable nondisplaced left medial malleolus fracture.  We will continue to treat this nonoperatively with weight-bear as tolerated in a cam boot.  He may wear the ASO brace when he is sleeping.  We will recheck him in 4 weeks with three-view x-rays of the left ankle.  He will need to remain out of work for now until follow-up.  Questions encouraged and answered.  Follow-Up Instructions: Return in about 4 weeks (around 02/25/2019).   Orders:  Orders Placed This Encounter  Procedures  . XR Ankle Complete Left   No orders of the defined types were placed in this encounter.     Procedures: No procedures performed   Clinical Data: No additional findings.   Subjective: No chief complaint on file.   Donnie is two-week status post nondisplaced left medial medial malleolus fracture.  I saw him in the hospital.  He is here for his first follow-up visit.  He also has rib fractures.  In terms of the ankle he is doing well.   Review of Systems   Objective: Vital Signs: There were no vitals taken for this visit.  Physical Exam  Ortho Exam Left ankle exam shows minimal swelling.  There is minimal tenderness palpation over the medial malleolus. Specialty Comments:  No specialty comments available.  Imaging: Xr Ankle Complete Left  Result Date: 01/28/2019 Stable alignment of medial malleolus fracture.    PMFS History: Patient Active Problem List   Diagnosis Date Noted  . Closed fracture of left ankle   . Closed nondisplaced avulsion fracture of tuberosity of left calcaneus   . Rib fractures 01/15/2019   . Overweight (BMI 25.0-29.9) 12/30/2017  . Aortic atherosclerosis ([place]) 12/30/2017  . Acute medial meniscal tear, right, initial encounter 06/25/2017  . Acute medial meniscal tear, left, initial encounter 06/25/2017  . 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 Chondromalacia, right knee 06/25/2017  . Elevated PSA 11/18/2015  . RLS (restless legs syndrome) 10/05/2014  . COPD (chronic obstructive pulmonary disease) ([place]) 10/05/2014  . BPH (benign prostatic hyperplasia) 10/05/2014  . Type 2 diabetes mellitus ([place]) 06/05/2014  . Vitamin D deficiency 04/01/2014  . Medication management 04/01/2014  . Smoking 07/21/2013  . Hypertension   . OSA and COPD overlap syndrome ([place])   . Hyperlipidemia associated with type 2 diabetes mellitus ([place]) 04/03/2011  . CAD (coronary artery disease) 04/03/2011   Past Medical History:  Diagnosis Date  . CAD (coronary artery disease)    a. s/p stent to RCA 2005; b. LHC 2006 after Nuc with inf ischemia: patent RCA stent, D1 80%, LAD 60% => med Rx;  c. LHC 04/18/11: Proximal LAD 60% (unchanged from previous catheterization), distal LAD 65% (small caliber), Dx 60%, proximal CFX 20%, mid CFX 50%, OM1 30%, proximal RCA 20%, mid RCA stent patent, EF 65%.=> med Rx  . GERD (gastroesophageal reflux disease)   . HLD (hyperlipidemia)   . Hypertension   . Obesity   . OSA (obstructive sleep  apnea)    has a cpap-does not use-has a special mouthpiece he wears at night  . Type II or unspecified type diabetes mellitus without mention of complication, not stated as uncontrolled   . Vitamin D deficiency   . Wears glasses     Family History  Problem Relation Age of Onset  . Prostate cancer Father   . Hypertension Father   . Cancer Mother        vaginal?   . Kidney cancer Maternal Grandmother     Past Surgical History:  Procedure Laterality Date  . CARDIAC CATHETERIZATION  2005   stent  . CHONDROPLASTY  06/25/2017   Procedure: CHONDROPLASTY;  Surgeon: Dorna Leitz, MD;   Location: [place];  Service: Orthopedics;;  . COLONOSCOPY  11/2011   Due 5 years Dr. Fuller Plan  . heart stent  2005   RCA  . INGUINAL HERNIA REPAIR Right 11/16/2014   Procedure: RIGHT INGUINAL HERNIA REPAIR WITH MESH;  Surgeon: Armandina Gemma, MD;  Location: [place];  Service: General;  Laterality: Right;  . INSERTION OF MESH Right 11/16/2014   Procedure: INSERTION OF MESH;  Surgeon: Armandina Gemma, MD;  Location: [place];  Service: General;  Laterality: Right;  . KNEE ARTHROSCOPY WITH MEDIAL MENISECTOMY Bilateral 06/25/2017   Procedure: right knee arthroscopy with medial meniscectomy, chondroplasty, Left knee arthroscopy with medial menisectomy removal of plica and chondroplasty;  Surgeon: Dorna Leitz, MD;  Location: [facility];  Service: Orthopedics;  Laterality: Bilateral;  . POLYPECTOMY     Social History   Occupational History  . Not on file  Tobacco Use  . Smoking status: Former Smoker    Packs/day: 1.00    Years: 40.00    Pack years: 40.00    Types: Cigarettes    Quit date: 12/23/2018    Years since quitting: 0.0  . Smokeless tobacco: Never Used  Substance and Sexual Activity  . Alcohol use: Yes    Comment: 3-6 per night  . Drug use: No  . Sexual activity: Not Currently

## 2019-02-10 ENCOUNTER — Encounter: Payer: Self-pay | Admitting: Adult Health

## 2019-02-18 ENCOUNTER — Ambulatory Visit (INDEPENDENT_AMBULATORY_CARE_PROVIDER_SITE_OTHER): Payer: Self-pay

## 2019-02-18 ENCOUNTER — Encounter: Payer: Self-pay | Admitting: Orthopaedic Surgery

## 2019-02-18 ENCOUNTER — Other Ambulatory Visit: Payer: Self-pay

## 2019-02-18 ENCOUNTER — Ambulatory Visit (INDEPENDENT_AMBULATORY_CARE_PROVIDER_SITE_OTHER): Payer: Self-pay | Admitting: Orthopaedic Surgery

## 2019-02-18 DIAGNOSIS — M25572 Pain in left ankle and joints of left foot: Secondary | ICD-10-CM

## 2019-02-18 NOTE — Progress Notes (Signed)
Patient: Joe Green           Date of Birth: [birth year]           MRN: [id number] Visit Date: 02/18/2019 PCP: [name], MD   Assessment & Plan:  Chief Complaint:  Chief Complaint  Patient presents with  . Left Ankle - Follow-up, Fracture   Visit Diagnoses:  1. Pain in left ankle and joints of left foot     Plan: Averi is 4 weeks status post nondisplaced left medial malleolus fracture.  He is doing well overall.  He is ambulating with a cane and ASO.  He does not report any worsening or issues with the ankle. He does not complain of any significant pain and does not have tenderness to palpation.  He does not have any swelling.  He has good range of motion of the ankle.  His x-rays demonstrate continued bony consolidation and fracture healing. At this point I would like him to continue to wear the ASO when he is ambulating.  I do not feel that he would really benefit from outpatient physical therapy.  He is already quite active.  I would like to recheck him one more time in about 4 weeks with three-view x-rays of the left ankle.  She is doing well at that time we will likely release him.  Follow-Up Instructions: Return in about 4 weeks (around 03/18/2019).   Orders:  Orders Placed This Encounter  Procedures  . XR Ankle Complete Left   No orders of the defined types were placed in this encounter.   Imaging: Xr Ankle Complete Left  Result Date: 02/18/2019 Increased bony consolidation of the nondisplaced medial malleolus fracture.  No evidence of worsening.   PMFS History: Patient Active Problem List   Diagnosis Date Noted  . Closed fracture of left ankle   . Closed nondisplaced avulsion fracture of tuberosity of left calcaneus   . Rib fractures 01/15/2019  . Overweight (BMI 25.0-29.9) 12/30/2017  . Aortic atherosclerosis ([place]) 12/30/2017  . Acute medial meniscal tear, right, initial encounter 06/25/2017  . Acute medial meniscal tear, left, initial encounter  06/25/2017  . 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 Chondromalacia, right knee 06/25/2017  . Elevated PSA 11/18/2015  . RLS (restless legs syndrome) 10/05/2014  . COPD (chronic obstructive pulmonary disease) ([place]) 10/05/2014  . BPH (benign prostatic hyperplasia) 10/05/2014  . Type 2 diabetes mellitus ([place]) 06/05/2014  . Vitamin D deficiency 04/01/2014  . Medication management 04/01/2014  . Smoking 07/21/2013  . Hypertension   . OSA and COPD overlap syndrome ([place])   . Hyperlipidemia associated with type 2 diabetes mellitus ([place]) 04/03/2011  . CAD (coronary artery disease) 04/03/2011   Past Medical History:  Diagnosis Date  . CAD (coronary artery disease)    a. s/p stent to RCA 2005; b. LHC 2006 after Nuc with inf ischemia: patent RCA stent, D1 80%, LAD 60% => med Rx;  c. LHC 04/18/11: Proximal LAD 60% (unchanged from previous catheterization), distal LAD 65% (small caliber), Dx 60%, proximal CFX 20%, mid CFX 50%, OM1 30%, proximal RCA 20%, mid RCA stent patent, EF 65%.=> med Rx  . GERD (gastroesophageal reflux disease)   . HLD (hyperlipidemia)   . Hypertension   . Obesity   . OSA (obstructive sleep apnea)    has a cpap-does not use-has a special mouthpiece he wears at night  . Type II or unspecified type diabetes mellitus without mention of complication, not stated as uncontrolled   .  Vitamin D deficiency   . Wears glasses     Family History  Problem Relation Age of Onset  . Prostate cancer Father   . Hypertension Father   . Cancer Mother        vaginal?   . Kidney cancer Maternal Grandmother     Past Surgical History:  Procedure Laterality Date  . CARDIAC CATHETERIZATION  2005   stent  . CHONDROPLASTY  06/25/2017   Procedure: CHONDROPLASTY;  Surgeon: Dorna Leitz, MD;  Location: [place];  Service: Orthopedics;;  . COLONOSCOPY  11/2011   Due 5 years Dr. Fuller Plan  . heart stent  2005   RCA  . INGUINAL HERNIA REPAIR Right 11/16/2014   Procedure: RIGHT  INGUINAL HERNIA REPAIR WITH MESH;  Surgeon: Armandina Gemma, MD;  Location: [facility];  Service: General;  Laterality: Right;  . INSERTION OF MESH Right 11/16/2014   Procedure: INSERTION OF MESH;  Surgeon: Armandina Gemma, MD;  Location: [place];  Service: General;  Laterality: Right;  . KNEE ARTHROSCOPY WITH MEDIAL MENISECTOMY Bilateral 06/25/2017   Procedure: right knee arthroscopy with medial meniscectomy, chondroplasty, Left knee arthroscopy with medial menisectomy removal of plica and chondroplasty;  Surgeon: Dorna Leitz, MD;  Location: [facility];  Service: Orthopedics;  Laterality: Bilateral;  . POLYPECTOMY     Social History   Occupational History  . Not on file  Tobacco Use  . Smoking status: Former Smoker    Packs/day: 1.00    Years: 40.00    Pack years: 40.00    Types: Cigarettes    Quit date: 12/23/2018    Years since quitting: 0.1  . Smokeless tobacco: Never Used  Substance and Sexual Activity  . Alcohol use: Yes    Comment: 3-6 per night  . Drug use: No  . Sexual activity: Not Currently

## 2019-03-06 NOTE — Progress Notes (Signed)
Complete Physical  Assessment and Plan:   Encounter for general adult medical examination with abnormal findings  Essential hypertension Continue medications Monitor blood pressure at home; call if consistently over 130/80 Continue DASH diet.   Reminder to go to the ER if any CP, SOB, nausea, dizziness, severe HA, changes vision/speech, left arm numbness and tingling and jaw pain.  Coronary artery disease involving native heart with angina pectoris, unspecified vessel or lesion type ([place]) Control blood pressure, cholesterol, glucose, increase exercise.  Continue cardio follow up Stop smoking  OSA and COPD overlap syndrome (HCC) Wears mouthpiece, followed by Dr. Toy Cookey  Chronic obstructive pulmonary disease, unspecified COPD type ([place]) Continue inhalers, stop smoking, has recent CT lung, defer annual screening   Type 2 diabetes mellitus with other circulatory complication, without long-term current use of insulin [facility]) Education: Reviewed 'ABCs' of diabetes management (respective goals in parentheses):  A1C (<7), blood pressure (<130/80), and cholesterol (LDL <70) Eye Exam yearly and Dental Exam every 6 months - reminded due for annual diabetic eye exam  Dietary recommendations Physical Activity recommendations Foot exam completed - normal  Benign prostatic hyperplasia with urinary frequency Doxazosin and finasteride Followed by urology   Vitamin D deficiency Continue supplementation Check vitamin D level  Smoking Discussed risks associated with tobacco use and advised to reduce or quit Patient is ready to do so and plans to taper Declines Chantix or other medication Will follow up at the next visit Strategies for success discussed.  Had chest CT 01/2019  RLS (restless legs syndrome) , increase daytime activity, check iron, wants to try gabapentin - 100-300 mg at HS, follow up 1 month  Mixed hyperlipidemia Continue medications Continue low cholesterol diet and  exercise.  Check lipid panel.   Medication management CBC, CMP/GFR, UA, magnesium  Elevated PSA Check PSA Followed by urology   Overweight (BMI 25.0-29.9) Long discussion about weight loss, diet, and exercise Recommended diet heavy in fruits and veggies and low in animal meats, cheeses, and dairy products, appropriate calorie intake Discussed appropriate weight for height  Follow up at next visit  Aortic atherosclerosis ([place]) Control blood pressure, cholesterol, glucose, increase exercise.     Discussed med's effects and SE's. Screening labs and tests as requested with regular follow-up as recommended. Over 40 minutes of exam, counseling, chart review and critical decision making was performed  Future Appointments  Date Time Provider [place]  03/14/2019 10:00 AM Leandrew Koyanagi, MD [facility] None  03/16/2020 10:00 AM [name], NP [facility] None     HPI 68 y.o. male patient presents for a complete physical. He has Hyperlipidemia associated with type 2 diabetes mellitus ([place]); CAD (coronary artery disease); Hypertension; OSA and COPD overlap syndrome ([place]); Smoking; Vitamin D deficiency; Medication management; Type 2 diabetes mellitus (HCC); RLS (restless legs syndrome); COPD (chronic obstructive pulmonary disease) (HCC); BPH (benign prostatic hyperplasia); Elevated PSA; Acute medial meniscal tear, right, initial encounter; Acute medial meniscal tear, left, initial encounter; Chondromalacia, left knee; Chondromalacia, right knee; Overweight (BMI 25.0-29.9); Aortic atherosclerosis ([place]); Rib fractures; Closed fracture of left ankle; and Closed nondisplaced avulsion fracture of tuberosity of left calcaneus on their problem list.  He is married with 3 kids, 1 is his. 8 Grandchildren.   This year in 01/2019 he fell 6 feet from a ladder while installing a gazebo. Workup showedLeft rib FX 6-8,Left medial malleolus FX, andLeft calcaneous FX. Followed by Dr. Erlinda Hong, patient reports  recovered well and no residual issues.   he is a smoker, quit this  year 12/2018, but admits smoking some some days, max 1/2 pack; 40 pack year history; He had Ct lung in 01/2019 which did not show any concerning nodules. He does have COPD and is on Breo  OSA, intolerant of CPAP and had been using mouth piece but he feels didn't work well; managed by Dr. Toy Cookey, has been recommended repeat sleep study but patient adamantly declines repeat.   He has RLS, had been declining meds but having some trouble sleeping, wanted to try klonopin but discussed risks with med, discussed gabapentin alternately for insomnia and RLS and agreeable to try.   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 He has reached weight goal which was set for 190 lb and happy with current weight.  Wt Readings from Last 3 Encounters:  03/10/19 189 lb (85.7 kg)  01/15/19 190 lb (86.2 kg)  07/25/18 192 lb 9.6 oz (87.4 kg)   He has a history of CAD s/p PCI to RCA in2005. He underwent LHC in2006 and 2012 which showed a proximal LAD 60% (unchanged from previous catheterization), Medical therapy recommended. Has done well since cath in 2012 and followed by Dr. Kyla Balzarine office.  His blood pressure has been controlled at home, today their BP is BP: 120/72 He does not workout but works as a welder/physcally intense job. He denies chest pain, shortness of breath, dizziness.   He is on cholesterol medication (rosuvastastin 20 mg three days a week) and denies myalgias. His cholesterol is not at goal. The cholesterol last visit was:   Lab Results  Component Value Date   CHOL 144 07/25/2018   HDL 35 (L) 07/25/2018   LDLCALC 80 07/25/2018   TRIG 196 (H) 07/25/2018   CHOLHDL 4.1 07/25/2018   He has been working on diet and exercise for diabetes (on metformin 1000 mg BID, glimepiride 2 mg daily supposed to be taking BID), he is on bASA, he is on ACE/ARB and denies foot  ulcerations, increased appetite, nausea, paresthesia of the feet, polydipsia, polyuria, visual disturbances, vomiting and weight loss. He does check fasting sugars ranging 140-180. Last A1C in the office was:  Lab Results  Component Value Date   HGBA1C 9.1 (H) 01/16/2019   Last GFR: Lab Results  Component Value Date   GFRNONAA >60 01/15/2019    Patient is on Vitamin D supplement, 10000 IU daily    Lab Results  Component Value Date   VD25OH 24 01/01/2018     Has hx of elevated PSAs, BPH with LUTS (nocturia x 2 night) and is followed by [facility]. He is on doxazosin and finasteride Lab Results  Component Value Date   PSA 3.8 01/01/2018   PSA 2.9 11/21/2016   PSA 3.57 11/18/2015      Current Medications:  Current Outpatient Medications on File Prior to Visit  Medication Sig Dispense Refill  . aspirin EC 81 MG tablet Take 1 tablet (81 mg total) by mouth daily. 90 tablet 3  . benzonatate (TESSALON) 200 MG capsule Take 1 capsule (200 mg total) by mouth 3 (three) times daily as needed. For cough. 90 capsule 1  . blood glucose meter kit and supplies Dispense based on patient and insurance preference. Use up to four times daily as directed. (FOR ICD-9 250.00, 250.01). 1 each 0  . BREO ELLIPTA 100-25 MCG/INH AEPB INHALE 1 PUFF BY MOUTH ONCE DAILY. RINSE MOUTH WITH WATER AFTER EACH USE. (  Patient taking differently: Inhale 1 puff into the lungs daily. Rinse mouth with water after each use) 60 each 0  . cetirizine (ZYRTEC) 10 MG tablet Take 10 mg by mouth as needed for allergies.     . Cholecalciferol (VITAMIN D) 50 MCG (2000 UT) tablet Take 1 tablet (2,000 Units total) by mouth daily. (Patient taking differently: Take 10,000 Units by mouth daily. )    . doxazosin (CARDURA) 8 MG tablet Take 1 tablet (8 mg total) by mouth daily. 90 tablet 0  . fexofenadine (ALLEGRA) 60 MG tablet Take 60 mg by mouth daily as needed for allergies or rhinitis.    . finasteride (PROSCAR) 5 MG tablet  Take 1 tablet (5 mg total) by mouth daily. 90 tablet 1  . glimepiride (AMARYL) 2 MG tablet Take 1 tablet (2 mg total) by mouth 2 (two) times daily with a meal. 60 tablet 0  . glucose blood test strip Test blood sugars once daily.Dx: E11.9 100 each 2  . metFORMIN (GLUCOPHAGE) 1000 MG tablet Take 1 tablet (1,000 mg total) by mouth 2 (two) times daily. 180 tablet 1  . ramipril (ALTACE) 5 MG capsule Take 1 capsule by mouth once daily 90 capsule 0  . rosuvastatin (CRESTOR) 20 MG tablet TAKE 1 TABLET BY MOUTH ONCE DAILY (Patient taking differently: Take 20 mg by mouth daily. ) 90 tablet 1  . acetaminophen (TYLENOL) 500 MG tablet Take 2 tablets (1,000 mg total) by mouth every 6 (six) hours. 30 tablet 0  . blood glucose meter kit and supplies Dispense based on patient and insurance preference. Use up to four times daily as directed. (FOR ICD-10 E10.9, E11.9). 1 each 0   No current facility-administered medications on file prior to visit.    Allergies:  No Known Allergies Health Maintenance:  Immunization History  Administered Date(s) Administered  . Influenza, High Dose Seasonal PF 06/22/2017  . Influenza-Unspecified 06/02/2013, 08/18/2014  . Pneumococcal Conjugate-13 11/21/2016  . Pneumococcal Polysaccharide-23 08/27/2007, 01/01/2018  . Td 01/01/2018  . Tdap 08/27/2007  . Zoster 10/20/2013   Tetanus: 2019 Pneumovax: 2009, 2019 Prevnar 13: 2018 Flu vaccine: 2019 at [facility] Zostavax: 2015  DEXA: - Colonoscopy: 2013 OVERDUE 5 year follow up- referred back  EGD: -  CT lung: 01/15/2019  Stress test: 11/2016  Eye Exam:  Dr. ?, last 02/11/2018 with report abstracted Dentist: last remote, had scheduled and got cancelled due to covid 19  Patient Care Team: [name], MD as PCP - General (Internal Medicine) Josue Hector, MD as Consulting Physician (Cardiology) Ladene Artist, MD as Consulting Physician (Gastroenterology)  Medical History:  has Hyperlipidemia associated with type 2  diabetes mellitus [facility]); CAD (coronary artery disease); Hypertension; OSA and COPD overlap syndrome ([place]); Smoking; Vitamin D deficiency; Medication management; Type 2 diabetes mellitus (HCC); RLS (restless legs syndrome); COPD (chronic obstructive pulmonary disease) (HCC); BPH (benign prostatic hyperplasia); Elevated PSA; Acute medial meniscal tear, right, initial encounter; Acute medial meniscal tear, left, initial encounter; Chondromalacia, left knee; Chondromalacia, right knee; Overweight (BMI 25.0-29.9); Aortic atherosclerosis ([place]); Rib fractures; Closed fracture of left ankle; and Closed nondisplaced avulsion fracture of tuberosity of left calcaneus on their problem list. Surgical History:  He  has a past surgical history that includes heart stent (2005); Colonoscopy (11/2011); Polypectomy; Cardiac catheterization (2005); Inguinal hernia repair (Right, 11/16/2014); Insertion of mesh (Right, 11/16/2014); Knee arthroscopy with medial menisectomy (Bilateral, 06/25/2017); and Chondroplasty (06/25/2017). Family History:  His family history includes Cancer in his mother; Hypertension in his father; Kidney  cancer in his maternal grandmother; Prostate cancer in his father. Social History:   reports that he has been smoking cigarettes. He has a 40.00 pack-year smoking history. He has never used smokeless tobacco. He reports current alcohol use of about 28.0 standard drinks of alcohol per week. He reports that he does not use drugs. Review of Systems:  Review of Systems  Constitutional: Negative for malaise/fatigue and weight loss.  HENT: Negative for hearing loss and tinnitus.   Eyes: Negative for blurred vision and double vision.  Respiratory: Negative for cough, sputum production, shortness of breath and wheezing.   Cardiovascular: Negative for chest pain, palpitations, orthopnea, claudication, leg swelling and PND.  Gastrointestinal: Negative for abdominal pain, blood in stool, constipation, diarrhea,  heartburn, melena, nausea and vomiting.  Genitourinary: Negative.   Musculoskeletal: Negative for falls, joint pain and myalgias.  Skin: Negative for rash.  Neurological: Negative for dizziness, tingling, sensory change, weakness and headaches.  Endo/Heme/Allergies: Negative for polydipsia.  Psychiatric/Behavioral: Negative for depression, memory loss, substance abuse and suicidal ideas. The patient has insomnia (intermittent with RLS symptoms). The patient is not nervous/anxious.   All other systems reviewed and are negative.   Physical Exam: Estimated body mass index is 27.12 kg/m as calculated from the following:   Height as of this encounter: 5' 10"  (1.778 m).   Weight as of this encounter: 189 lb (85.7 kg). BP 120/72   Pulse 78   Temp (!) 96.6 F (35.9 C)   Ht 5' 10"  (1.778 m)   Wt 189 lb (85.7 kg)   SpO2 95%   BMI 27.12 kg/m  General Appearance: Well nourished, in no apparent distress.  Eyes: PERRLA, EOMs, conjunctiva no swelling or erythema, normal fundi and vessels.  Sinuses: No Frontal/maxillary tenderness  ENT/Mouth: Ext aud canals clear, normal light reflex with TMs without erythema, bulging. Good dentition. No erythema, swelling, or exudate on post pharynx. Tonsils not swollen or erythematous. Hearing normal.  Neck: Supple, thyroid normal. No bruits  Respiratory: Respiratory effort normal, BS equal bilaterally without rales, rhonchi, wheezing or stridor.  Cardio: RRR without murmurs, rubs or gallops. Brisk peripheral pulses without edema.  Chest: symmetric, with normal excursions and percussion.  Abdomen: Soft, nontender, no guarding, rebound, hernias, masses, or organomegaly.  Lymphatics: Non tender without lymphadenopathy.  Genitourinary:  Musculoskeletal: Full ROM all peripheral extremities,5/5 strength, and normal gait.  Skin: Warm, dry without rashes, lesions, ecchymosis. Neuro: Cranial nerves intact, reflexes equal bilaterally. Normal muscle tone, no cerebellar  symptoms. Sensation intact.  Psych: Awake and oriented X 3, normal affect, Insight and Judgment appropriate.   EKG: RBBB - no significant changes from previous  [place] 10:15 AM [facility]

## 2019-03-10 ENCOUNTER — Ambulatory Visit: Payer: Managed Care, Other (non HMO) | Admitting: Adult Health

## 2019-03-10 ENCOUNTER — Other Ambulatory Visit: Payer: Self-pay

## 2019-03-10 ENCOUNTER — Encounter: Payer: Self-pay | Admitting: Adult Health

## 2019-03-10 VITALS — BP 120/72 | HR 78 | Temp 96.6°F | Ht 70.0 in | Wt 189.0 lb

## 2019-03-10 DIAGNOSIS — Z1389 Encounter for screening for other disorder: Secondary | ICD-10-CM

## 2019-03-10 DIAGNOSIS — Z13220 Encounter for screening for lipoid disorders: Secondary | ICD-10-CM | POA: Diagnosis not present

## 2019-03-10 DIAGNOSIS — R350 Frequency of micturition: Secondary | ICD-10-CM | POA: Diagnosis not present

## 2019-03-10 DIAGNOSIS — Z125 Encounter for screening for malignant neoplasm of prostate: Secondary | ICD-10-CM

## 2019-03-10 DIAGNOSIS — Z130 Encounter for screening for diseases of the blood and blood-forming organs and certain disorders involving the immune mechanism: Secondary | ICD-10-CM | POA: Diagnosis not present

## 2019-03-10 DIAGNOSIS — R972 Elevated prostate specific antigen [PSA]: Secondary | ICD-10-CM

## 2019-03-10 DIAGNOSIS — IMO0001 Reserved for inherently not codable concepts without codable children: Secondary | ICD-10-CM

## 2019-03-10 DIAGNOSIS — Z136 Encounter for screening for cardiovascular disorders: Secondary | ICD-10-CM

## 2019-03-10 DIAGNOSIS — E785 Hyperlipidemia, unspecified: Secondary | ICD-10-CM

## 2019-03-10 DIAGNOSIS — E1169 Type 2 diabetes mellitus with other specified complication: Secondary | ICD-10-CM

## 2019-03-10 DIAGNOSIS — E559 Vitamin D deficiency, unspecified: Secondary | ICD-10-CM

## 2019-03-10 DIAGNOSIS — I10 Essential (primary) hypertension: Secondary | ICD-10-CM

## 2019-03-10 DIAGNOSIS — Z79899 Other long term (current) drug therapy: Secondary | ICD-10-CM

## 2019-03-10 DIAGNOSIS — Z0000 Encounter for general adult medical examination without abnormal findings: Secondary | ICD-10-CM | POA: Diagnosis not present

## 2019-03-10 DIAGNOSIS — G2581 Restless legs syndrome: Secondary | ICD-10-CM

## 2019-03-10 DIAGNOSIS — E1159 Type 2 diabetes mellitus with other circulatory complications: Secondary | ICD-10-CM

## 2019-03-10 DIAGNOSIS — Z1211 Encounter for screening for malignant neoplasm of colon: Secondary | ICD-10-CM

## 2019-03-10 DIAGNOSIS — E663 Overweight: Secondary | ICD-10-CM

## 2019-03-10 DIAGNOSIS — I700 Atherosclerosis of aorta: Secondary | ICD-10-CM

## 2019-03-10 DIAGNOSIS — F17200 Nicotine dependence, unspecified, uncomplicated: Secondary | ICD-10-CM

## 2019-03-10 DIAGNOSIS — D649 Anemia, unspecified: Secondary | ICD-10-CM

## 2019-03-10 DIAGNOSIS — Z0001 Encounter for general adult medical examination with abnormal findings: Secondary | ICD-10-CM

## 2019-03-10 DIAGNOSIS — N401 Enlarged prostate with lower urinary tract symptoms: Secondary | ICD-10-CM | POA: Diagnosis not present

## 2019-03-10 DIAGNOSIS — J449 Chronic obstructive pulmonary disease, unspecified: Secondary | ICD-10-CM

## 2019-03-10 DIAGNOSIS — I25119 Atherosclerotic heart disease of native coronary artery with unspecified angina pectoris: Secondary | ICD-10-CM

## 2019-03-10 DIAGNOSIS — G4733 Obstructive sleep apnea (adult) (pediatric): Secondary | ICD-10-CM

## 2019-03-10 MED ORDER — GABAPENTIN 100 MG PO CAPS: ORAL_CAPSULE | ORAL | 2 refills | Status: DC

## 2019-03-10 NOTE — Patient Instructions (Addendum)
  Joe Green , Thank you for taking time to come for your Annual Wellness Visit. I appreciate your ongoing commitment to your health goals. Please review the following plan we discussed and let me know if I can assist you in the future.   These are the goals we discussed: Goals    . Exercise 150 minutes per week (moderate activity)    . HEMOGLOBIN A1C < 7    . LDL CALC < 70    . Weight (lb) < 190 lb (86.2 kg)       This is a list of the screening recommended for you and due dates:  Health Maintenance  Topic Date Due  . Colon Cancer Screening  11/13/2016  . Complete foot exam   01/02/2019  . Eye exam for diabetics  02/12/2019  . Flu Shot  03/15/2019  . Hemoglobin A1C  07/18/2019  . Tetanus Vaccine  01/02/2028  .  Hepatitis C: One time screening is recommended by Center for Disease Control  (CDC) for  adults born from [birth year] through 1964.   Completed  . Pneumonia vaccines  Completed    Please schedule a diabetic eye exam and have them forward [place] the note  Reschedule dentist  Increase glimepiride to 2 mg twice daily - goal for fasting sugar is <130; call if consistently above this and we can increase your dose further     Know what a healthy weight is for you (roughly BMI <25) and aim to maintain this  Aim for 7+ servings of fruits and vegetables daily  65-80+ fluid ounces of water or unsweet tea for healthy kidneys  Limit to max 1 drink of alcohol per day; avoid smoking/tobacco  Limit animal fats in diet for cholesterol and heart health - choose grass fed whenever available  Avoid highly processed foods, and foods high in saturated/trans fats  Aim for low stress - take time to unwind and care for your mental health  Aim for 150 min of moderate intensity exercise weekly for heart health, and weights twice weekly for bone health  Aim for 7-9 hours of sleep daily    You have untreated sleep apnea. It can cause interrupted sleep, headaches, frequent awakenings, fatigue,  dry mouth, fast/slow heart beats, memory issues, anxiety/depression, swelling, numbness tingling hands/feet, weight gain, shortness of breath, and the list goes on. Sleep apnea needs to be ruled out because if it is left untreated it does eventually lead to abnormal heart beats, lung failure or heart failure as well as increasing the risk of heart attack and stroke. There are masks you can wear OR a mouth piece that I can give you information about. Often times though people feel MUCH better after getting treatment.   Sleep Apnea  Sleep apnea is a sleep disorder characterized by abnormal pauses in breathing while you sleep. When your breathing pauses, the level of oxygen in your blood decreases. This causes you to move out of deep sleep and into light sleep. As a result, your quality of sleep is poor, and the system that carries your blood throughout your body (cardiovascular system) experiences stress. If sleep apnea remains untreated, the following conditions can develop:  High blood pressure (hypertension).  Coronary artery disease.  Inability to achieve or maintain an erection (impotence).  Impairment of your thought process (cognitive dysfunction).

## 2019-03-11 LAB — URINALYSIS, ROUTINE W REFLEX MICROSCOPIC
Bilirubin Urine: NEGATIVE
Hgb urine dipstick: NEGATIVE
Ketones, ur: NEGATIVE
Leukocytes,Ua: NEGATIVE
Nitrite: NEGATIVE
Protein, ur: NEGATIVE
Specific Gravity, Urine: 1.031 (ref 1.001–1.03)
pH: 6 (ref 5.0–8.0)

## 2019-03-11 LAB — MAGNESIUM: Magnesium: 1.8 mg/dL (ref 1.5–2.5)

## 2019-03-11 LAB — CBC WITH DIFFERENTIAL/PLATELET
Absolute Monocytes: 581 cells/uL (ref 200–950)
Basophils Absolute: 63 cells/uL (ref 0–200)
Basophils Relative: 0.9 %
Eosinophils Absolute: 287 cells/uL (ref 15–500)
Eosinophils Relative: 4.1 %
HCT: 44.7 % (ref 38.5–50.0)
Hemoglobin: 15.1 g/dL (ref 13.2–17.1)
Lymphs Abs: 1519 cells/uL (ref 850–3900)
MCH: 31.5 pg (ref 27.0–33.0)
MCHC: 33.8 g/dL (ref 32.0–36.0)
MCV: 93.1 fL (ref 80.0–100.0)
MPV: 11.8 fL (ref 7.5–12.5)
Monocytes Relative: 8.3 %
Neutro Abs: 4550 cells/uL (ref 1500–7800)
Neutrophils Relative %: 65 %
Platelets: 178 10*3/uL (ref 140–400)
RBC: 4.8 10*6/uL (ref 4.20–5.80)
RDW: 12.1 % (ref 11.0–15.0)
Total Lymphocyte: 21.7 %
WBC: 7 10*3/uL (ref 3.8–10.8)

## 2019-03-11 LAB — TSH: TSH: 1.04 mIU/L (ref 0.40–4.50)

## 2019-03-11 LAB — COMPLETE METABOLIC PANEL WITH GFR
AG Ratio: 1.7 (calc) (ref 1.0–2.5)
ALT: 13 U/L (ref 9–46)
AST: 11 U/L (ref 10–35)
Albumin: 4.2 g/dL (ref 3.6–5.1)
Alkaline phosphatase (APISO): 103 U/L (ref 35–144)
BUN: 10 mg/dL (ref 7–25)
CO2: 24 mmol/L (ref 20–32)
Calcium: 9.2 mg/dL (ref 8.6–10.3)
Chloride: 103 mmol/L (ref 98–110)
Creat: 0.76 mg/dL (ref 0.70–1.25)
GFR, Est African American: 109 mL/min/{1.73_m2} (ref >=60)
GFR, Est Non African American: 94 mL/min/{1.73_m2} (ref >=60)
Globulin: 2.5 g/dL (calc) (ref 1.9–3.7)
Glucose, Bld: 246 mg/dL — ABNORMAL HIGH (ref 65–99)
Potassium: 4.4 mmol/L (ref 3.5–5.3)
Sodium: 139 mmol/L (ref 135–146)
Total Bilirubin: 0.4 mg/dL (ref 0.2–1.2)
Total Protein: 6.7 g/dL (ref 6.1–8.1)

## 2019-03-11 LAB — LIPID PANEL
Cholesterol: 98 mg/dL (ref <200)
HDL: 41 mg/dL (ref >=40)
LDL Cholesterol (Calc): 43 mg/dL (calc)
Non-HDL Cholesterol (Calc): 57 mg/dL (calc) (ref <130)
Total CHOL/HDL Ratio: 2.4 (calc) (ref <5.0)
Triglycerides: 60 mg/dL (ref <150)

## 2019-03-11 LAB — VITAMIN D 25 HYDROXY (VIT D DEFICIENCY, FRACTURES): Vit D, 25-Hydroxy: 84 ng/mL (ref 30–100)

## 2019-03-11 LAB — IRON,TIBC AND FERRITIN PANEL
%SAT: 22 % (calc) (ref 20–48)
Ferritin: 43 ng/mL (ref 24–380)
Iron: 76 ug/dL (ref 50–180)
TIBC: 340 mcg/dL (calc) (ref 250–425)

## 2019-03-11 LAB — MICROALBUMIN / CREATININE URINE RATIO
Creatinine, Urine: 50 mg/dL (ref 20–320)
Microalb Creat Ratio: 10 mcg/mg creat (ref <30)
Microalb, Ur: 0.5 mg/dL

## 2019-03-11 LAB — PSA: PSA: 3.3 ng/mL (ref <=4.0)

## 2019-03-11 LAB — VITAMIN B12: Vitamin B-12: 274 pg/mL (ref 200–1100)

## 2019-03-14 ENCOUNTER — Other Ambulatory Visit: Payer: Self-pay

## 2019-03-14 ENCOUNTER — Encounter: Payer: Self-pay | Admitting: Orthopaedic Surgery

## 2019-03-14 ENCOUNTER — Ambulatory Visit (INDEPENDENT_AMBULATORY_CARE_PROVIDER_SITE_OTHER): Payer: Self-pay

## 2019-03-14 ENCOUNTER — Ambulatory Visit (INDEPENDENT_AMBULATORY_CARE_PROVIDER_SITE_OTHER): Payer: Self-pay | Admitting: Orthopaedic Surgery

## 2019-03-14 DIAGNOSIS — I25119 Atherosclerotic heart disease of native coronary artery with unspecified angina pectoris: Secondary | ICD-10-CM

## 2019-03-14 DIAGNOSIS — M25572 Pain in left ankle and joints of left foot: Secondary | ICD-10-CM

## 2019-03-14 NOTE — Progress Notes (Signed)
Patient: Joe Green           Date of Birth: July 29, 1951           MRN: [id number] Visit Date: 03/14/2019 PCP: [name], MD   Assessment & Plan:  Chief Complaint:  Chief Complaint  Patient presents with  . Left Ankle - Pain   Visit Diagnoses:  1. Left ankle pain, unspecified chronicity     Plan: Patient is a pleasant 68 year old gentleman who presents our clinic today approximately 7 weeks status post nondisplaced fracture left medial malleolus.  He has been doing well.  He has been ambulating without an ASO for the past week.  No issues.  No pain.  Examination of his left ankle reveals no tenderness to the fracture site.  Full range of motion.  Is neurovascular intact distally.  At this point his fracture has healed and he will advance with activity as tolerated.  He will follow-up with [place] as needed.  Follow-Up Instructions: Return if symptoms worsen or fail to improve.   Orders:  Orders Placed This Encounter  Procedures  . XR Ankle Complete Left   No orders of the defined types were placed in this encounter.   Imaging: No results found.  PMFS History: Patient Active Problem List   Diagnosis Date Noted  . Closed fracture of left ankle   . Closed nondisplaced avulsion fracture of tuberosity of left calcaneus   . Rib fractures 01/15/2019  . Overweight (BMI 25.0-29.9) 12/30/2017  . Aortic atherosclerosis ([place]) 12/30/2017  . Acute medial meniscal tear, right, initial encounter 06/25/2017  . Acute medial meniscal tear, left, initial encounter 06/25/2017  . 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 Chondromalacia, right knee 06/25/2017  . Elevated PSA 11/18/2015  . RLS (restless legs syndrome) 10/05/2014  . COPD (chronic obstructive pulmonary disease) ([place]) 10/05/2014  . BPH (benign prostatic hyperplasia) 10/05/2014  . Type 2 diabetes mellitus ([place]) 06/05/2014  . Vitamin D deficiency 04/01/2014  . Medication management 04/01/2014  . Smoking 07/21/2013   . Hypertension   . OSA and COPD overlap syndrome ([place])   . Hyperlipidemia associated with type 2 diabetes mellitus ([place]) 04/03/2011  . CAD (coronary artery disease) 04/03/2011   Past Medical History:  Diagnosis Date  . CAD (coronary artery disease)    a. s/p stent to RCA 2005; b. LHC 2006 after Nuc with inf ischemia: patent RCA stent, D1 80%, LAD 60% => med Rx;  c. LHC 04/18/11: Proximal LAD 60% (unchanged from previous catheterization), distal LAD 65% (small caliber), Dx 60%, proximal CFX 20%, mid CFX 50%, OM1 30%, proximal RCA 20%, mid RCA stent patent, EF 65%.=> med Rx  . GERD (gastroesophageal reflux disease)   . HLD (hyperlipidemia)   . Hypertension   . Obesity   . OSA (obstructive sleep apnea)    has a cpap-does not use-has a special mouthpiece he wears at night  . Type II or unspecified type diabetes mellitus without mention of complication, not stated as uncontrolled   . Vitamin D deficiency   . Wears glasses     Family History  Problem Relation Age of Onset  . Prostate cancer Father   . Hypertension Father   . Cancer Mother        vaginal?   . Kidney cancer Maternal Grandmother     Past Surgical History:  Procedure Laterality Date  . CARDIAC CATHETERIZATION  2005   stent  . CHONDROPLASTY  06/25/2017   Procedure: CHONDROPLASTY;  Surgeon:  Dorna Leitz, MD;  Location: [place];  Service: Orthopedics;;  . COLONOSCOPY  11/2011   Due 5 years Dr. Fuller Plan  . heart stent  2005   RCA  . INGUINAL HERNIA REPAIR Right 11/16/2014   Procedure: RIGHT INGUINAL HERNIA REPAIR WITH MESH;  Surgeon: Armandina Gemma, MD;  Location: [place];  Service: General;  Laterality: Right;  . INSERTION OF MESH Right 11/16/2014   Procedure: INSERTION OF MESH;  Surgeon: Armandina Gemma, MD;  Location: [place];  Service: General;  Laterality: Right;  . KNEE ARTHROSCOPY WITH MEDIAL MENISECTOMY Bilateral 06/25/2017   Procedure: right knee arthroscopy with medial  meniscectomy, chondroplasty, Left knee arthroscopy with medial menisectomy removal of plica and chondroplasty;  Surgeon: Dorna Leitz, MD;  Location: [place];  Service: Orthopedics;  Laterality: Bilateral;  . POLYPECTOMY     Social History   Occupational History  . Not on file  Tobacco Use  . Smoking status: Current Some Day Smoker    Packs/day: 1.00    Years: 40.00    Pack years: 40.00    Types: Cigarettes  . Smokeless tobacco: Never Used  Substance and Sexual Activity  . Alcohol use: Yes    Alcohol/week: 28.0 standard drinks    Types: 28 Cans of beer per week    Comment: 3-6 per night  . Drug use: No  . Sexual activity: Yes    Partners: Female    Birth control/protection: Post-menopausal

## 2019-03-17 ENCOUNTER — Other Ambulatory Visit: Payer: Self-pay | Admitting: Internal Medicine

## 2019-03-20 ENCOUNTER — Other Ambulatory Visit: Payer: Self-pay | Admitting: Internal Medicine

## 2019-04-01 ENCOUNTER — Other Ambulatory Visit: Payer: Self-pay

## 2019-04-01 DIAGNOSIS — E1159 Type 2 diabetes mellitus with other circulatory complications: Secondary | ICD-10-CM

## 2019-04-01 MED ORDER — GLIMEPIRIDE 2 MG PO TABS: 2.0000 mg | ORAL_TABLET | Freq: Two times a day (BID) | ORAL | 0 refills | Status: DC

## 2019-04-24 ENCOUNTER — Ambulatory Visit (INDEPENDENT_AMBULATORY_CARE_PROVIDER_SITE_OTHER): Payer: Managed Care, Other (non HMO)

## 2019-04-24 ENCOUNTER — Encounter: Payer: Self-pay | Admitting: Orthopaedic Surgery

## 2019-04-24 ENCOUNTER — Ambulatory Visit (INDEPENDENT_AMBULATORY_CARE_PROVIDER_SITE_OTHER): Payer: Managed Care, Other (non HMO) | Admitting: Orthopaedic Surgery

## 2019-04-24 VITALS — Ht 70.5 in | Wt 185.0 lb

## 2019-04-24 DIAGNOSIS — M25572 Pain in left ankle and joints of left foot: Secondary | ICD-10-CM

## 2019-04-24 MED ORDER — DICLOFENAC SODIUM 75 MG PO TBEC: DELAYED_RELEASE_TABLET | ORAL | 1 refills | Status: DC

## 2019-04-24 NOTE — Progress Notes (Signed)
Office Visit Note   Patient: Joe Green           Date of Birth: [birth year]           MRN: [id number] Visit Date: 04/24/2019              Requested by: [name], MD [address] [place],  [place] [id number] PCP: [name], MD   Assessment & Plan: Visit Diagnoses:  1. Pain in left ankle and joints of left foot     Plan: Impression is left ankle pain likely related to arthritis given his clinical course.  We will start him on scheduled anti-inflammatories for the next 2 weeks and then as needed.  He will follow-up with [place] as needed.  Call with concerns or questions.  Follow-Up Instructions: Return if symptoms worsen or fail to improve.   Orders:  Orders Placed This Encounter  Procedures  . XR Ankle Complete Left   Meds ordered this encounter  Medications  . diclofenac (VOLTAREN) 75 MG EC tablet    Sig: Take one tab po bid x 2 weeks and then take as needed for pain    Dispense:  60 tablet    Refill:  1      Procedures: No procedures performed   Clinical Data: No additional findings.   Subjective: Chief Complaint  Patient presents with  . Left Ankle - Pain    HPI patient is a pleasant 68 year old gentleman who presents our clinic today with lateral ankle pain.  He is little over 3 months out from a left ankle medial malleolus fracture.  He has been doing well with that.  This has healed without any issues.  The pain he has to the lateral ankle has been ongoing for a while now.  He has recently worsened over the past week.  No known injury or change in activity.  He notes the pain is worse when he goes from a seated to standing position but improves after ambulating for a period of time.  He has been using an ASO as well as taking anti-inflammatories at night over the past few days.  Review of Systems as detailed in HPI.  All others reviewed and are negative.   Objective: Vital Signs: Ht 5' 10.5" (1.791 m)   Wt 185 lb (83.9 kg)    BMI 26.17 kg/m   Physical Exam well-developed and well-nourished gentleman in no acute distress.  Alert and oriented x3.  Ortho Exam examination of his left ankle reveals no tenderness over the lateral malleolus or distal fibula.  No ligamentous tenderness.  No tenderness along the peroneal tendon.  Full and painless range of motion about the left ankle.  He is neurovascular intact distally.  Specialty Comments:  No specialty comments available.  Imaging: Xr Ankle Complete Left  Result Date: 04/24/2019 X-rays demonstrate a healed medial malleolus ankle fracture.  No acute findings otherwise to explain the lateral ankle pain.    PMFS History: Patient Active Problem List   Diagnosis Date Noted  . Closed fracture of left ankle   . Closed nondisplaced avulsion fracture of tuberosity of left calcaneus   . Rib fractures 01/15/2019  . Overweight (BMI 25.0-29.9) 12/30/2017  . Aortic atherosclerosis ([place]) 12/30/2017  . Acute medial meniscal tear, right, initial encounter 06/25/2017  . Acute medial meniscal tear, left, initial encounter 06/25/2017  . 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 Chondromalacia, right knee 06/25/2017  . Elevated PSA 11/18/2015  . RLS (restless legs  syndrome) 10/05/2014  . COPD (chronic obstructive pulmonary disease) ([place]) 10/05/2014  . BPH (benign prostatic hyperplasia) 10/05/2014  . Type 2 diabetes mellitus ([place]) 06/05/2014  . Vitamin D deficiency 04/01/2014  . Medication management 04/01/2014  . Smoking 07/21/2013  . Hypertension   . OSA and COPD overlap syndrome ([place])   . Hyperlipidemia associated with type 2 diabetes mellitus ([place]) 04/03/2011  . CAD (coronary artery disease) 04/03/2011   Past Medical History:  Diagnosis Date  . CAD (coronary artery disease)    a. s/p stent to RCA 2005; b. LHC 2006 after Nuc with inf ischemia: patent RCA stent, D1 80%, LAD 60% => med Rx;  c. LHC 04/18/11: Proximal LAD 60% (unchanged from previous catheterization),  distal LAD 65% (small caliber), Dx 60%, proximal CFX 20%, mid CFX 50%, OM1 30%, proximal RCA 20%, mid RCA stent patent, EF 65%.=> med Rx  . GERD (gastroesophageal reflux disease)   . HLD (hyperlipidemia)   . Hypertension   . Obesity   . OSA (obstructive sleep apnea)    has a cpap-does not use-has a special mouthpiece he wears at night  . Type II or unspecified type diabetes mellitus without mention of complication, not stated as uncontrolled   . Vitamin D deficiency   . Wears glasses     Family History  Problem Relation Age of Onset  . Prostate cancer Father   . Hypertension Father   . Cancer Mother        vaginal?   . Kidney cancer Maternal Grandmother     Past Surgical History:  Procedure Laterality Date  . CARDIAC CATHETERIZATION  2005   stent  . CHONDROPLASTY  06/25/2017   Procedure: CHONDROPLASTY;  Surgeon: Dorna Leitz, MD;  Location: [facility];  Service: Orthopedics;;  . COLONOSCOPY  11/2011   Due 5 years Dr. Fuller Plan  . heart stent  2005   RCA  . INGUINAL HERNIA REPAIR Right 11/16/2014   Procedure: RIGHT INGUINAL HERNIA REPAIR WITH MESH;  Surgeon: Armandina Gemma, MD;  Location: [facility];  Service: General;  Laterality: Right;  . INSERTION OF MESH Right 11/16/2014   Procedure: INSERTION OF MESH;  Surgeon: Armandina Gemma, MD;  Location: [facility];  Service: General;  Laterality: Right;  . KNEE ARTHROSCOPY WITH MEDIAL MENISECTOMY Bilateral 06/25/2017   Procedure: right knee arthroscopy with medial meniscectomy, chondroplasty, Left knee arthroscopy with medial menisectomy removal of plica and chondroplasty;  Surgeon: Dorna Leitz, MD;  Location: [facility];  Service: Orthopedics;  Laterality: Bilateral;  . POLYPECTOMY     Social History   Occupational History  . Not on file  Tobacco Use  . Smoking status: Current Some Day Smoker    Packs/day: 1.00    Years: 40.00    Pack years: 40.00    Types: Cigarettes  .  Smokeless tobacco: Never Used  Substance and Sexual Activity  . Alcohol use: Yes    Alcohol/week: 28.0 standard drinks    Types: 28 Cans of beer per week    Comment: 3-6 per night  . Drug use: No  . Sexual activity: Yes    Partners: Female    Birth control/protection: Post-menopausal

## 2019-05-12 ENCOUNTER — Encounter: Payer: Self-pay | Admitting: Adult Health

## 2019-05-15 ENCOUNTER — Other Ambulatory Visit: Payer: Self-pay | Admitting: Physician Assistant

## 2019-05-15 ENCOUNTER — Other Ambulatory Visit: Payer: Self-pay | Admitting: Adult Health Nurse Practitioner

## 2019-05-15 DIAGNOSIS — E119 Type 2 diabetes mellitus without complications: Secondary | ICD-10-CM

## 2019-05-15 DIAGNOSIS — E1159 Type 2 diabetes mellitus with other circulatory complications: Secondary | ICD-10-CM

## 2019-06-10 ENCOUNTER — Other Ambulatory Visit: Payer: Self-pay | Admitting: *Deleted

## 2019-06-10 DIAGNOSIS — N320 Bladder-neck obstruction: Secondary | ICD-10-CM

## 2019-06-10 DIAGNOSIS — N401 Enlarged prostate with lower urinary tract symptoms: Secondary | ICD-10-CM

## 2019-06-10 MED ORDER — DOXAZOSIN MESYLATE 8 MG PO TABS: ORAL_TABLET | ORAL | 0 refills | Status: DC

## 2019-06-10 MED ORDER — FINASTERIDE 5 MG PO TABS: ORAL_TABLET | ORAL | 1 refills | Status: DC

## 2019-06-11 NOTE — Progress Notes (Signed)
FOLLOW UP  Assessment and Plan:   Aortic atherosclerosis (HCC) Control blood pressure, cholesterol, glucose, increase exercise.   Coronary artery disease involving native heart with angina pectoris, unspecified vessel or lesion type ([place]) Control blood pressure, cholesterol, glucose, increase exercise.  Continue cardio follow up Stop smoking  OSA and COPD overlap syndrome (HCC) Wears mouthpiece, followed by Dr. Toy Cookey  Chronic obstructive pulmonary disease, unspecified COPD type ([place]) Continue inhalers, stop smoking, has recent CT lung 01/2019  Hypertension Well controlled with current medications  Monitor blood pressure at home; patient to call if consistently greater than 130/80 Continue DASH diet.   Reminder to go to the ER if any CP, SOB, nausea, dizziness, severe HA, changes vision/speech, left arm numbness and tingling and jaw pain.  Cholesterol Currently at goal; continue statin, discussed LDL goal < 70 Continue low cholesterol diet and exercise.  Check lipid panel.   Diabetes with other circulatory complications Continue medication: metformin, glimepiride 2 mg BID; farxiga was discontinued due to mycotic infections; will avoid SGLT2 until A1Cs improve He would prefer to avoid insulin; discussed GLP-1 inhibitor - ozempic starter pack provided after discussion, 0.25 mg x 2-4 weeks, 0.5 mg x 2-4 weeks, then 1 mg weekly (increase as tolerated) Continue diet and exercise.  Perform daily foot/skin check, notify office of any concerning changes.  Check A1C  Overweight with co morbidities Long discussion about weight loss, diet, and exercise Recommended diet heavy in fruits and veggies and low in animal meats, cheeses, and dairy products, appropriate calorie intake Discussed ideal weight for height and initial weight goal  Patient will work on increasing intentional exercise  Will follow up in 3 months  Vitamin D Def Near goal at last visit; continue supplementation   Defer Vit D level   Continue diet and meds as discussed. Further disposition pending results of labs. Discussed med's effects and SE's.   Over 30 minutes of exam, counseling, chart review, and critical decision making was performed.   Future Appointments  Date Time Provider [place]  09/22/2019  9:30 AM [name], NP [facility] None  03/16/2020 10:00 AM [name], NP [facility] None    ----------------------------------------------------------------------------------------------------------------------  HPI 68 y.o. male  presents for 3 month follow up on CAD, hypertension, cholesterol, diabetes, weight and vitamin D deficiency.   He is a some day smoker, 5-6 cigarettes some days, down from 1 pack/day for 40 years and consequently with COPD; also OSA with CPAP mouth piece followed by Dr. Toy Cookey but admits he is not wearing;  he is prescribed BREO and has expressed significant benefit with this.  He has also admitted to excess alcohol intake, 3-6 drinks nightly and has been advised to reduce this intake significantly but hasn't done so.   BMI is Body mass index is 27.87 kg/m., he has been working on diet, he is active at work, as a Water engineer. He reports steady weight at home at 187 lb - weighs several times/week. He is happy at his current weight. Wt Readings from Last 3 Encounters:  06/12/19 197 lb (89.4 kg)  04/24/19 185 lb (83.9 kg)  03/10/19 189 lb (85.7 kg)   He has a history of CAD s/p PCI to [place] in2005; He underwent LHC in2006 after nuc showed inf ischemia;He underwent LHC in 04/18/11 which showed a proximal LAD 60% (unchanged from previous catheterization), distal LAD 65% (small caliber), Dx 60%, proximal CFX 20%, mid CFX 50%, OM1 30%, proximal RCA 20%, mid RCA stent patent, EF 65%. Medical therapy recommended.  Follows with cardiology - Dr. Johnsie Cancel and colleagues.  His blood pressure has been controlled at home, today their BP is BP: 120/70  He does not  workout. He denies chest pain, shortness of breath, dizziness.   He is on cholesterol medication (rosuvastatin 20 mg three days a week) and denies myalgias. His cholesterol is at goal. The cholesterol last visit was:   Lab Results  Component Value Date   CHOL 98 03/10/2019   HDL 41 03/10/2019   LDLCALC 43 03/10/2019   TRIG 60 03/10/2019   CHOLHDL 2.4 03/10/2019    He has been working on diet and exercise for T2DM (on metformin 2000 mg, glimepiride 2 mg BID, formerly on farxiga but stopped due to yeast infections), and denies foot ulcerations, increased appetite, nausea, paresthesia of the feet, polydipsia, polyuria, visual disturbances, vomiting and weight loss. He does check sporadic fasting glucose, runs 150-180. Last A1C in the office was:  Lab Results  Component Value Date   HGBA1C 9.1 (H) 01/16/2019   Lab Results  Component Value Date   GFRNONAA 94 03/10/2019   Patient is on Vitamin D supplement.   Lab Results  Component Value Date   VD25OH 84 03/10/2019        Current Medications:  Current Outpatient Medications on File Prior to Visit  Medication Sig  . acetaminophen (TYLENOL) 500 MG tablet Take 2 tablets (1,000 mg total) by mouth every 6 (six) hours. (Patient taking differently: Take 1,000 mg by mouth as needed. )  . aspirin EC 81 MG tablet Take 1 tablet (81 mg total) by mouth daily.  . benzonatate (TESSALON) 200 MG capsule Take 1 capsule (200 mg total) by mouth 3 (three) times daily as needed. For cough.  . blood glucose meter kit and supplies Dispense based on patient and insurance preference. Use up to four times daily as directed. (FOR ICD-9 250.00, 250.01).  . blood glucose meter kit and supplies Dispense based on patient and insurance preference. Use up to four times daily as directed. (FOR ICD-10 E10.9, E11.9).  Marland Kitchen BREO ELLIPTA 100-25 MCG/INH AEPB INHALE 1 PUFF BY MOUTH ONCE DAILY. RINSE MOUTH WITH WATER AFTER EACH USE. (Patient taking differently: Inhale 1 puff into  the lungs daily. Rinse mouth with water after each use)  . cetirizine (ZYRTEC) 10 MG tablet Take 10 mg by mouth as needed for allergies.   . Cholecalciferol (VITAMIN D) 50 MCG (2000 UT) tablet Take 1 tablet (2,000 Units total) by mouth daily. (Patient taking differently: Take 10,000 Units by mouth daily. )  . diclofenac (VOLTAREN) 75 MG EC tablet Take one tab po bid x 2 weeks and then take as needed for pain  . doxazosin (CARDURA) 8 MG tablet Take 1 tablet daily for BP and prostate.  . fexofenadine (ALLEGRA) 60 MG tablet Take 60 mg by mouth daily as needed for allergies or rhinitis.  . finasteride (PROSCAR) 5 MG tablet Take 1 tablet daily for prostate.  . gabapentin (NEURONTIN) 100 MG capsule Take 1-3 caps prior to bedtime for sleep and restless legs.  Marland Kitchen glimepiride (AMARYL) 2 MG tablet Take 1 tablet 2 x /day with Meals for Diabetes  . glucose blood test strip Test blood sugars once daily.Dx: E11.9  . metFORMIN (GLUCOPHAGE) 1000 MG tablet Take 1 tablet 2 x /day with Meals for Diabetes  . ramipril (ALTACE) 5 MG capsule Take 1 capsule by mouth once daily  . rosuvastatin (CRESTOR) 20 MG tablet Take 1 tablet by mouth once daily  No current facility-administered medications on file prior to visit.      Allergies: No Known Allergies   Medical History:  Past Medical History:  Diagnosis Date  . CAD (coronary artery disease)    a. s/p stent to RCA 2005; b. LHC 2006 after Nuc with inf ischemia: patent RCA stent, D1 80%, LAD 60% => med Rx;  c. LHC 04/18/11: Proximal LAD 60% (unchanged from previous catheterization), distal LAD 65% (small caliber), Dx 60%, proximal CFX 20%, mid CFX 50%, OM1 30%, proximal RCA 20%, mid RCA stent patent, EF 65%.=> med Rx  . GERD (gastroesophageal reflux disease)   . HLD (hyperlipidemia)   . Hypertension   . Obesity   . OSA (obstructive sleep apnea)    has a cpap-does not use-has a special mouthpiece he wears at night  . Type II or unspecified type diabetes mellitus  without mention of complication, not stated as uncontrolled   . Vitamin D deficiency   . Wears glasses    Family history- Reviewed and unchanged Social history- Reviewed and unchanged   Review of Systems:  Review of Systems  Constitutional: Negative for malaise/fatigue and weight loss.  HENT: Negative for hearing loss and tinnitus.   Eyes: Negative for blurred vision and double vision.  Respiratory: Negative for cough, shortness of breath and wheezing.   Cardiovascular: Negative for chest pain, palpitations, orthopnea, claudication and leg swelling.  Gastrointestinal: Negative for abdominal pain, blood in stool, constipation, diarrhea, heartburn, melena, nausea and vomiting.  Genitourinary: Negative.   Musculoskeletal: Negative for joint pain and myalgias.  Skin: Negative for rash.  Neurological: Negative for dizziness, tingling, sensory change, weakness and headaches.  Endo/Heme/Allergies: Negative for polydipsia.  Psychiatric/Behavioral: Negative.   All other systems reviewed and are negative.   Physical Exam: BP 120/70   Pulse 86   Temp 97.7 F (36.5 C)   Ht 5' 10.5" (1.791 m)   Wt 197 lb (89.4 kg)   SpO2 96%   BMI 27.87 kg/m  Wt Readings from Last 3 Encounters:  06/12/19 197 lb (89.4 kg)  04/24/19 185 lb (83.9 kg)  03/10/19 189 lb (85.7 kg)   General Appearance: Well nourished, in no apparent distress. Eyes: PERRLA, EOMs, conjunctiva no swelling or erythema Sinuses: No Frontal/maxillary tenderness ENT/Mouth: Ext aud canals clear, TMs without erythema, bulging. No erythema, swelling, or exudate on post pharynx.  Tonsils not swollen or erythematous. Hearing normal.  Neck: Supple, thyroid normal.  Respiratory: Respiratory effort normal, BS equal bilaterally without rales, rhonchi, wheezing or stridor.  Cardio: RRR with no MRGs. Brisk peripheral pulses without edema.  Abdomen: Soft, + BS.  Non tender, no guarding, rebound, hernias, masses. Lymphatics: Non tender  without lymphadenopathy.  Musculoskeletal: Full ROM, 5/5 strength, Normal gait Skin: Warm, dry without rashes, lesions, ecchymosis.  Neuro: Cranial nerves intact. No cerebellar symptoms.  Psych: Awake and oriented X 3, normal affect, Insight and Judgment appropriate.    Izora Ribas, NP 9:46 AM [facility]

## 2019-06-12 ENCOUNTER — Ambulatory Visit: Payer: Managed Care, Other (non HMO) | Admitting: Adult Health

## 2019-06-12 ENCOUNTER — Other Ambulatory Visit: Payer: Self-pay

## 2019-06-12 ENCOUNTER — Encounter: Payer: Self-pay | Admitting: Adult Health

## 2019-06-12 VITALS — BP 120/70 | HR 86 | Temp 97.7°F | Ht 70.5 in | Wt 197.0 lb

## 2019-06-12 DIAGNOSIS — E1159 Type 2 diabetes mellitus with other circulatory complications: Secondary | ICD-10-CM | POA: Diagnosis not present

## 2019-06-12 DIAGNOSIS — F17200 Nicotine dependence, unspecified, uncomplicated: Secondary | ICD-10-CM

## 2019-06-12 DIAGNOSIS — I700 Atherosclerosis of aorta: Secondary | ICD-10-CM | POA: Diagnosis not present

## 2019-06-12 DIAGNOSIS — I10 Essential (primary) hypertension: Secondary | ICD-10-CM | POA: Diagnosis not present

## 2019-06-12 DIAGNOSIS — E559 Vitamin D deficiency, unspecified: Secondary | ICD-10-CM

## 2019-06-12 DIAGNOSIS — J449 Chronic obstructive pulmonary disease, unspecified: Secondary | ICD-10-CM | POA: Diagnosis not present

## 2019-06-12 DIAGNOSIS — E1169 Type 2 diabetes mellitus with other specified complication: Secondary | ICD-10-CM | POA: Diagnosis not present

## 2019-06-12 DIAGNOSIS — Z79899 Other long term (current) drug therapy: Secondary | ICD-10-CM | POA: Diagnosis not present

## 2019-06-12 DIAGNOSIS — I25119 Atherosclerotic heart disease of native coronary artery with unspecified angina pectoris: Secondary | ICD-10-CM | POA: Diagnosis not present

## 2019-06-12 DIAGNOSIS — G4733 Obstructive sleep apnea (adult) (pediatric): Secondary | ICD-10-CM

## 2019-06-12 DIAGNOSIS — E663 Overweight: Secondary | ICD-10-CM

## 2019-06-12 DIAGNOSIS — E785 Hyperlipidemia, unspecified: Secondary | ICD-10-CM | POA: Diagnosis not present

## 2019-06-12 MED ORDER — BENZONATATE 200 MG PO CAPS: 200.0000 mg | ORAL_CAPSULE | Freq: Three times a day (TID) | ORAL | 1 refills | Status: DC | PRN

## 2019-06-12 MED ORDER — OZEMPIC (1 MG/DOSE) 2 MG/1.5ML ~~LOC~~ SOPN: 1.0000 mg | PEN_INJECTOR | SUBCUTANEOUS | 1 refills | Status: DC

## 2019-06-12 MED ORDER — BREO ELLIPTA 100-25 MCG/INH IN AEPB: INHALATION_SPRAY | RESPIRATORY_TRACT | 0 refills | Status: DC

## 2019-06-12 NOTE — Patient Instructions (Addendum)
Goals    . Exercise 150 minutes per week (moderate activity)    . fasting glucose <130    . HEMOGLOBIN A1C < 7    . LDL CALC < 70         Semaglutide injection solution What is this medicine? SEMAGLUTIDE (Sem a GLOO tide) is used to improve blood sugar control in adults with type 2 diabetes. This medicine may be used with other diabetes medicines. This drug may also reduce the risk of heart attack or stroke if you have type 2 diabetes and risk factors for heart disease. This medicine may be used for other purposes; ask your health care provider or pharmacist if you have questions. COMMON BRAND NAME(S): OZEMPIC What should I tell my health care provider before I take this medicine? They need to know if you have any of these conditions:  endocrine tumors (MEN 2) or if someone in your family had these tumors  eye disease, vision problems  history of pancreatitis  kidney disease  stomach problems  thyroid cancer or if someone in your family had thyroid cancer  an unusual or allergic reaction to semaglutide, other medicines, foods, dyes, or preservatives  pregnant or trying to get pregnant  breast-feeding How should I use this medicine? This medicine is for injection under the skin of your upper leg (thigh), stomach area, or upper arm. It is given once every week (every 7 days). You will be taught how to prepare and give this medicine. Use exactly as directed. Take your medicine at regular intervals. Do not take it more often than directed. If you use this medicine with insulin, you should inject this medicine and the insulin separately. Do not mix them together. Do not give the injections right next to each other. Change (rotate) injection sites with each injection. It is important that you put your used needles and syringes in a special sharps container. Do not put them in a trash can. If you do not have a sharps container, call your pharmacist or healthcare provider to get one. A  special MedGuide will be given to you by the pharmacist with each prescription and refill. Be sure to read this information carefully each time. Talk to your pediatrician regarding the use of this medicine in children. Special care may be needed. Overdosage: If you think you have taken too much of this medicine contact a poison control center or emergency room at once. NOTE: This medicine is only for you. Do not share this medicine with others. What if I miss a dose? If you miss a dose, take it as soon as you can within 5 days after the missed dose. Then take your next dose at your regular weekly time. If it has been longer than 5 days after the missed dose, do not take the missed dose. Take the next dose at your regular time. Do not take double or extra doses. If you have questions about a missed dose, contact your health care provider for advice. What may interact with this medicine?  other medicines for diabetes Many medications may cause changes in blood sugar, these include:  alcohol containing beverages  antiviral medicines for HIV or AIDS  aspirin and aspirin-like drugs  certain medicines for blood pressure, heart disease, irregular heart beat  chromium  diuretics  male hormones, such as estrogens or progestins, birth control pills  fenofibrate  gemfibrozil  isoniazid  lanreotide  male hormones or anabolic steroids  MAOIs like Carbex, Eldepryl, Marplan, Nardil, and  Parnate  medicines for weight loss  medicines for allergies, asthma, cold, or cough  medicines for depression, anxiety, or psychotic disturbances  niacin  nicotine  NSAIDs, medicines for pain and inflammation, like ibuprofen or naproxen  octreotide  pasireotide  pentamidine  phenytoin  probenecid  quinolone antibiotics such as ciprofloxacin, levofloxacin, ofloxacin  some herbal dietary supplements  steroid medicines such as prednisone or cortisone  sulfamethoxazole; trimethoprim   thyroid hormones Some medications can hide the warning symptoms of low blood sugar (hypoglycemia). You may need to monitor your blood sugar more closely if you are taking one of these medications. These include:  beta-blockers, often used for high blood pressure or heart problems (examples include atenolol, metoprolol, propranolol)  clonidine  guanethidine  reserpine This list may not describe all possible interactions. Give your health care provider a list of all the medicines, herbs, non-prescription drugs, or dietary supplements you use. Also tell them if you smoke, drink alcohol, or use illegal drugs. Some items may interact with your medicine. What should I watch for while using this medicine? Visit your doctor or health care professional for regular checks on your progress. Drink plenty of fluids while taking this medicine. Check with your doctor or health care professional if you get an attack of severe diarrhea, nausea, and vomiting. The loss of too much body fluid can make it dangerous for you to take this medicine. A test called the HbA1C (A1C) will be monitored. This is a simple blood test. It measures your blood sugar control over the last 2 to 3 months. You will receive this test every 3 to 6 months. Learn how to check your blood sugar. Learn the symptoms of low and high blood sugar and how to manage them. Always carry a quick-source of sugar with you in case you have symptoms of low blood sugar. Examples include hard sugar candy or glucose tablets. Make sure others know that you can choke if you eat or drink when you develop serious symptoms of low blood sugar, such as seizures or unconsciousness. They must get medical help at once. Tell your doctor or health care professional if you have high blood sugar. You might need to change the dose of your medicine. If you are sick or exercising more than usual, you might need to change the dose of your medicine. Do not skip meals. Ask your  doctor or health care professional if you should avoid alcohol. Many nonprescription cough and cold products contain sugar or alcohol. These can affect blood sugar. Pens should never be shared. Even if the needle is changed, sharing may result in passing of viruses like hepatitis or HIV. Wear a medical ID bracelet or chain, and carry a card that describes your disease and details of your medicine and dosage times. Do not become pregnant while taking this medicine. Women should inform their doctor if they wish to become pregnant or think they might be pregnant. There is a potential for serious side effects to an unborn child. Talk to your health care professional or pharmacist for more information. What side effects may I notice from receiving this medicine? Side effects that you should report to your doctor or health care professional as soon as possible:  allergic reactions like skin rash, itching or hives, swelling of the face, lips, or tongue  breathing problems  changes in vision  diarrhea that continues or is severe  lump or swelling on the neck  severe nausea  signs and symptoms of infection like  fever or chills; cough; sore throat; pain or trouble passing urine  signs and symptoms of low blood sugar such as feeling anxious, confusion, dizziness, increased hunger, unusually weak or tired, sweating, shakiness, cold, irritable, headache, blurred vision, fast heartbeat, loss of consciousness  signs and symptoms of kidney injury like trouble passing urine or change in the amount of urine  trouble swallowing  unusual stomach upset or pain  vomiting Side effects that usually do not require medical attention (report to your doctor or health care professional if they continue or are bothersome):  constipation  diarrhea  nausea  pain, redness, or irritation at site where injected  stomach upset This list may not describe all possible side effects. Call your doctor for medical  advice about side effects. You may report side effects to FDA at 1-800-FDA-1088. Where should I keep my medicine? Keep out of the reach of children. Store unopened pens in a refrigerator between 2 and 8 degrees C (36 and 46 degrees F). Do not freeze. Protect from light and heat. After you first use the pen, it can be stored for 56 days at room temperature between 15 and 30 degrees C (59 and 86 degrees F) or in a refrigerator. Throw away your used pen after 56 days or after the expiration date, whichever comes first. Do not store your pen with the needle attached. If the needle is left on, medicine may leak from the pen. NOTE: This sheet is a summary. It may not cover all possible information. If you have questions about this medicine, talk to your doctor, pharmacist, or health care provider.  2020 Elsevier/Gold Standard (2018-08-30 14:25:32)     Please schedule a follow up with cardiology   Please get back on CPAP/mouth guard

## 2019-06-13 LAB — LIPID PANEL
Cholesterol: 107 mg/dL (ref <200)
HDL: 39 mg/dL — ABNORMAL LOW (ref >=40)
LDL Cholesterol (Calc): 49 mg/dL (calc)
Non-HDL Cholesterol (Calc): 68 mg/dL (calc) (ref <130)
Total CHOL/HDL Ratio: 2.7 (calc) (ref <5.0)
Triglycerides: 106 mg/dL (ref <150)

## 2019-06-13 LAB — COMPLETE METABOLIC PANEL WITH GFR
AG Ratio: 1.9 (calc) (ref 1.0–2.5)
ALT: 15 U/L (ref 9–46)
AST: 11 U/L (ref 10–35)
Albumin: 4.3 g/dL (ref 3.6–5.1)
Alkaline phosphatase (APISO): 98 U/L (ref 35–144)
BUN/Creatinine Ratio: 17 (calc) (ref 6–22)
BUN: 11 mg/dL (ref 7–25)
CO2: 26 mmol/L (ref 20–32)
Calcium: 9.4 mg/dL (ref 8.6–10.3)
Chloride: 102 mmol/L (ref 98–110)
Creat: 0.64 mg/dL — ABNORMAL LOW (ref 0.70–1.25)
GFR, Est African American: 117 mL/min/{1.73_m2} (ref >=60)
GFR, Est Non African American: 101 mL/min/{1.73_m2} (ref >=60)
Globulin: 2.3 g/dL (calc) (ref 1.9–3.7)
Glucose, Bld: 251 mg/dL — ABNORMAL HIGH (ref 65–99)
Potassium: 4.3 mmol/L (ref 3.5–5.3)
Sodium: 137 mmol/L (ref 135–146)
Total Bilirubin: 0.4 mg/dL (ref 0.2–1.2)
Total Protein: 6.6 g/dL (ref 6.1–8.1)

## 2019-06-13 LAB — CBC WITH DIFFERENTIAL/PLATELET
Absolute Monocytes: 616 cells/uL (ref 200–950)
Basophils Absolute: 46 cells/uL (ref 0–200)
Basophils Relative: 0.5 %
Eosinophils Absolute: 368 cells/uL (ref 15–500)
Eosinophils Relative: 4 %
HCT: 44 % (ref 38.5–50.0)
Hemoglobin: 14.8 g/dL (ref 13.2–17.1)
Lymphs Abs: 2070 cells/uL (ref 850–3900)
MCH: 31.4 pg (ref 27.0–33.0)
MCHC: 33.6 g/dL (ref 32.0–36.0)
MCV: 93.2 fL (ref 80.0–100.0)
MPV: 11.9 fL (ref 7.5–12.5)
Monocytes Relative: 6.7 %
Neutro Abs: 6100 cells/uL (ref 1500–7800)
Neutrophils Relative %: 66.3 %
Platelets: 176 10*3/uL (ref 140–400)
RBC: 4.72 10*6/uL (ref 4.20–5.80)
RDW: 11.9 % (ref 11.0–15.0)
Total Lymphocyte: 22.5 %
WBC: 9.2 10*3/uL (ref 3.8–10.8)

## 2019-06-13 LAB — MAGNESIUM: Magnesium: 1.8 mg/dL (ref 1.5–2.5)

## 2019-06-13 LAB — HEMOGLOBIN A1C
Hgb A1c MFr Bld: 7.7 % of total Hgb — ABNORMAL HIGH (ref <5.7)
Mean Plasma Glucose: 174 (calc)
eAG (mmol/L): 9.7 (calc)

## 2019-06-13 LAB — TSH: TSH: 1.15 mIU/L (ref 0.40–4.50)

## 2019-07-14 ENCOUNTER — Telehealth: Payer: Self-pay | Admitting: Cardiovascular Disease

## 2019-07-14 NOTE — Telephone Encounter (Signed)
Called patient back about his message. Patient complaining of left arm numbness that comes and goes. Patient stated this has been going on for a couple of weeks. Patient stated he had a stent in the past. Informed patient that he has not been seen since 2015, but Dr. Johnsie Cancel has an opening tomorrow and he can see him as a new patient. Informed patient that he would need to get an EKG, but his left arm numbness is probably more neurological then cardiac related. Patient denies any chest pain or SOB. Patient stated he has been feeling okay. Patient did state that he had a fall and broke some ribs earlier this year.  Patient will be evaluated at his office visit tomorrow. Patient agreed to plan.

## 2019-07-14 NOTE — Telephone Encounter (Signed)
New Message  Pt is calling stating that he is having numbness in left arm. Had stent put in 15 yrs ago. Would like to see Dr. Johnsie Cancel.  Please call to discuss

## 2019-07-15 ENCOUNTER — Encounter: Payer: Self-pay | Admitting: Cardiovascular Disease

## 2019-07-15 ENCOUNTER — Other Ambulatory Visit: Payer: Self-pay

## 2019-07-15 ENCOUNTER — Ambulatory Visit (INDEPENDENT_AMBULATORY_CARE_PROVIDER_SITE_OTHER): Payer: Managed Care, Other (non HMO) | Admitting: Cardiovascular Disease

## 2019-07-15 VITALS — BP 112/68 | HR 68 | Ht 70.5 in | Wt 200.0 lb

## 2019-07-15 DIAGNOSIS — I2510 Atherosclerotic heart disease of native coronary artery without angina pectoris: Secondary | ICD-10-CM

## 2019-07-15 DIAGNOSIS — I10 Essential (primary) hypertension: Secondary | ICD-10-CM | POA: Diagnosis not present

## 2019-07-15 DIAGNOSIS — Z720 Tobacco use: Secondary | ICD-10-CM

## 2019-07-15 DIAGNOSIS — E119 Type 2 diabetes mellitus without complications: Secondary | ICD-10-CM | POA: Diagnosis not present

## 2019-07-15 NOTE — Patient Instructions (Addendum)
Medication Instructions:  *If you need a refill on your cardiac medications before your next appointment, please call your pharmacy*  Lab Work: If you have labs (blood work) drawn today and your tests are completely normal, you will receive your results only by: . MyChart Message (if you have MyChart) OR . A paper copy in the mail If you have any lab test that is abnormal or we need to change your treatment, we will call you to review the results.  Testing/Procedures: Your physician has requested that you have a lexiscan myoview. For further information please visit [email or web address]. Please follow instruction sheet, as given.  Follow-Up: At [facility], you and your health needs are our priority.  As part of our continuing mission to provide you with exceptional heart care, we have created designated Provider Care Teams.  These Care Teams include your primary Cardiologist (physician) and Advanced Practice Providers (APPs -  Physician Assistants and Nurse Practitioners) who all work together to provide you with the care you need, when you need it.  Your next appointment:   12 month(s)  The format for your next appointment:   In Person  Provider:   You may see Dr. Nishan or one of the following Advanced Practice Providers on your designated Care Team:    Lori Gerhardt, NP  Laura Ingold, NP  Jill McDaniel, NP   

## 2019-07-15 NOTE — Progress Notes (Signed)
CARDIOLOGY CONSULT NOTE       Patient ID: Joe Green MRN: [id number] DOB/AGE: 1951-08-03 68 y.o.  Admit date: (Not on file) Referring Physician: Melford Aase Primary Physician: [name], MD Primary Cardiologist: New Reason for Consultation: CAD / Left arm numbness   Active Problems:   * No active hospital problems. *   HPI:  68 y.o. with COPD , smoking, DM2, HTN, HLD referred by primary for left arm pain / numbness ? Anginal equivalent I have not seen him in 5 years He had stent to RCA in 2005 Chronic RBBB Last cath 2006 with residual D1 and 60% LAD disease Similar moderate LAD/D1 disease and patent RCA stent by cath in September 2012   Fell and fractured ankle and some left sided ribs earlier this year Has been having left arm numbness for a few months Seems more muscular from shoulder down Not like his previous angina before stent when he had heaviness/tightness in chest Pain in shoulder And neck sometimes   ROS All other systems reviewed and negative except as noted above  Past Medical History:  Diagnosis Date  . CAD (coronary artery disease)    a. s/p stent to RCA 2005; b. LHC 2006 after Nuc with inf ischemia: patent RCA stent, D1 80%, LAD 60% => med Rx;  c. LHC 04/18/11: Proximal LAD 60% (unchanged from previous catheterization), distal LAD 65% (small caliber), Dx 60%, proximal CFX 20%, mid CFX 50%, OM1 30%, proximal RCA 20%, mid RCA stent patent, EF 65%.=> med Rx  . GERD (gastroesophageal reflux disease)   . HLD (hyperlipidemia)   . Hypertension   . Obesity   . OSA (obstructive sleep apnea)    has a cpap-does not use-has a special mouthpiece he wears at night  . Type II or unspecified type diabetes mellitus without mention of complication, not stated as uncontrolled   . Vitamin D deficiency   . Wears glasses     Family History  Problem Relation Age of Onset  . Prostate cancer Father   . Hypertension Father   . Cancer Mother        vaginal?   . Kidney  cancer Maternal Grandmother     Social History   Socioeconomic History  . Marital status: Married    Spouse name: Not on file  . Number of children: 3  . Years of education: Not on file  . Highest education level: Not on file  Occupational History  . Not on file  Social Needs  . Financial resource strain: Not on file  . Food insecurity    Worry: Not on file    Inability: Not on file  . Transportation needs    Medical: Not on file    Non-medical: Not on file  Tobacco Use  . Smoking status: Current Some Day Smoker    Packs/day: 1.00    Years: 40.00    Pack years: 40.00    Types: Cigarettes  . Smokeless tobacco: Never Used  Substance and Sexual Activity  . Alcohol use: Yes    Alcohol/week: 28.0 standard drinks    Types: 28 Cans of beer per week    Comment: 3-6 per night  . Drug use: No  . Sexual activity: Yes    Partners: Female    Birth control/protection: Post-menopausal  Lifestyle  . Physical activity    Days per week: Not on file    Minutes per session: Not on file  . Stress: Only a little  Relationships  .  Social Herbalist on phone: Not on file    Gets together: Not on file    Attends religious service: Not on file    Active member of club or organization: Not on file    Attends meetings of clubs or organizations: Not on file    Relationship status: Not on file  . Intimate partner violence    Fear of current or ex partner: Not on file    Emotionally abused: Not on file    Physically abused: Not on file    Forced sexual activity: Not on file  Other Topics Concern  . Not on file  Social History Narrative  . Not on file    Past Surgical History:  Procedure Laterality Date  . CARDIAC CATHETERIZATION  2005   stent  . CHONDROPLASTY  06/25/2017   Procedure: CHONDROPLASTY;  Surgeon: Dorna Leitz, MD;  Location: [place];  Service: Orthopedics;;  . COLONOSCOPY  11/2011   Due 5 years Dr. Fuller Plan  . heart stent  2005   RCA  .  INGUINAL HERNIA REPAIR Right 11/16/2014   Procedure: RIGHT INGUINAL HERNIA REPAIR WITH MESH;  Surgeon: Armandina Gemma, MD;  Location: [place];  Service: General;  Laterality: Right;  . INSERTION OF MESH Right 11/16/2014   Procedure: INSERTION OF MESH;  Surgeon: Armandina Gemma, MD;  Location: [place];  Service: General;  Laterality: Right;  . KNEE ARTHROSCOPY WITH MEDIAL MENISECTOMY Bilateral 06/25/2017   Procedure: right knee arthroscopy with medial meniscectomy, chondroplasty, Left knee arthroscopy with medial menisectomy removal of plica and chondroplasty;  Surgeon: Dorna Leitz, MD;  Location: [place];  Service: Orthopedics;  Laterality: Bilateral;  . POLYPECTOMY          Physical Exam: Blood pressure 112/68, pulse 68, height 5' 10.5" (1.791 m), weight 200 lb (90.7 kg), SpO2 98 %.   Affect appropriate Healthy:  appears stated age 4: normal Neck supple with no adenopathy JVP normal no bruits no thyromegaly Lungs clear with no wheezing and good diaphragmatic motion Heart:  S1/S2 no murmur, no rub, gallop or click PMI normal Abdomen: benighn, BS positve, no tenderness, no AAA no bruit.  No HSM or HJR Distal pulses intact with no bruits No edema Neuro non-focal Skin warm and dry No muscular weakness   Labs:   Lab Results  Component Value Date   WBC 9.2 06/12/2019   HGB 14.8 06/12/2019   HCT 44.0 06/12/2019   MCV 93.2 06/12/2019   PLT 176 06/12/2019   No results for input(s): NA, K, CL, CO2, BUN, CREATININE, CALCIUM, PROT, BILITOT, ALKPHOS, ALT, AST, GLUCOSE in the last 168 hours.  Invalid input(s): LABALBU No results found for: CKTOTAL, CKMB, CKMBINDEX, TROPONINI  Lab Results  Component Value Date   CHOL 107 06/12/2019   CHOL 98 03/10/2019   CHOL 144 07/25/2018   Lab Results  Component Value Date   HDL 39 (L) 06/12/2019   HDL 41 03/10/2019   HDL 35 (L) 07/25/2018   Lab Results  Component Value Date   LDLCALC 49  06/12/2019   LDLCALC 43 03/10/2019   LDLCALC 80 07/25/2018   Lab Results  Component Value Date   TRIG 106 06/12/2019   TRIG 60 03/10/2019   TRIG 196 (H) 07/25/2018   Lab Results  Component Value Date   CHOLHDL 2.7 06/12/2019   CHOLHDL 2.4 03/10/2019   CHOLHDL 4.1 07/25/2018   No results found for: LDLDIRECT  Radiology: No results found.  EKG: SR rate 66 aRBBB 03/10/19  07/15/19 SR rate 68 RBBB no acute changes    ASSESSMENT AND PLAN:   1. CAD:  Distant history of RCA stent with residual moderate LAD/D1 disease Numbness not anginal but needs F/u lexiscan myovue given known CAD  2. HTN:  Well controlled.  Continue current medications and low sodium Dash type diet.   3. HLD on statin labs with primary  4. DM:  Discussed low carb diet.  Target hemoglobin A1c is 6.5 or less.  Continue current medications. 5. COPD:  Counseled on smoking cessation less than 10 minutes CT done June for ribs showed emphysema with atelectasis and ? Aspiration  Should have lung cancer screening CT next June per primary   6. Numbness: fu primary suspect this is more muscular or sensory nerve May benefit from cervical spine and or shoulder imaging   Signed: Jenkins Rouge 07/15/2019, 9:28 AM

## 2019-07-22 ENCOUNTER — Telehealth (HOSPITAL_COMMUNITY): Payer: Self-pay | Admitting: *Deleted

## 2019-07-22 NOTE — Telephone Encounter (Signed)
Left message on voicemail per DPR in reference to upcoming appointment scheduled on 07/24/19 at 7:45 with detailed instructions given per Myocardial Perfusion Study Information Sheet for the test. LM to arrive 15 minutes early, and that it is imperative to arrive on time for appointment to keep from having the test rescheduled. If you need to cancel or reschedule your appointment, please call the office within 24 hours of your appointment. Failure to do so may result in a cancellation of your appointment, and a $50 no show fee. Phone number given for call back for any questions.

## 2019-07-24 ENCOUNTER — Other Ambulatory Visit: Payer: Self-pay

## 2019-07-24 ENCOUNTER — Ambulatory Visit (HOSPITAL_COMMUNITY): Payer: Managed Care, Other (non HMO) | Attending: Cardiovascular Disease

## 2019-07-24 DIAGNOSIS — I2510 Atherosclerotic heart disease of native coronary artery without angina pectoris: Secondary | ICD-10-CM | POA: Diagnosis not present

## 2019-07-24 LAB — MYOCARDIAL PERFUSION IMAGING
LV dias vol: 105 mL (ref 62–150)
LV sys vol: 37 mL
Peak HR: 92 {beats}/min
Rest HR: 66 {beats}/min
SDS: 0
SRS: 0
SSS: 0
TID: 1.03

## 2019-07-24 MED ORDER — TECHNETIUM TC 99M TETROFOSMIN IV KIT
10.9000 | PACK | Freq: Once | INTRAVENOUS | Status: AC | PRN
  Administered 2019-07-24: 10.9 via INTRAVENOUS
  Filled 2019-07-24: qty 11

## 2019-07-24 MED ORDER — REGADENOSON 0.4 MG/5ML IV SOLN
0.4000 mg | Freq: Once | INTRAVENOUS | Status: AC
  Administered 2019-07-24: 0.4 mg via INTRAVENOUS

## 2019-07-24 MED ORDER — TECHNETIUM TC 99M TETROFOSMIN IV KIT
31.9000 | PACK | Freq: Once | INTRAVENOUS | Status: AC | PRN
  Administered 2019-07-24: 31.9 via INTRAVENOUS
  Filled 2019-07-24: qty 32

## 2019-09-08 ENCOUNTER — Other Ambulatory Visit: Payer: Self-pay | Admitting: Internal Medicine

## 2019-09-08 DIAGNOSIS — E1159 Type 2 diabetes mellitus with other circulatory complications: Secondary | ICD-10-CM

## 2019-09-08 DIAGNOSIS — E119 Type 2 diabetes mellitus without complications: Secondary | ICD-10-CM

## 2019-09-08 DIAGNOSIS — N320 Bladder-neck obstruction: Secondary | ICD-10-CM

## 2019-09-11 ENCOUNTER — Ambulatory Visit: Payer: Managed Care, Other (non HMO) | Admitting: Orthopaedic Surgery

## 2019-09-11 NOTE — Progress Notes (Signed)
FOLLOW UP  Assessment and Plan:   Aortic atherosclerosis ([place]) Per CT 2020 Control blood pressure, cholesterol, glucose, increase exercise.   Coronary artery disease involving native heart with angina pectoris, unspecified vessel or lesion type ([place]) Control blood pressure, cholesterol, glucose, increase exercise.  Continue cardio follow up Stop smoking  OSA and COPD overlap syndrome ([facility]) Wears mouthpiece, encouraged compliance, risks discussed, follow up by Dr. Toy Cookey if needed  Chronic obstructive pulmonary disease, unspecified COPD type ([place]) Continue inhalers, stop smoking, has recent CT lung 01/2019  Hypertension Well controlled with current medications  Monitor blood pressure at home; patient to call if consistently greater than 130/80 Continue DASH diet.   Reminder to go to the ER if any CP, SOB, nausea, dizziness, severe HA, changes vision/speech, left arm numbness and tingling and jaw pain.  Cholesterol Currently at goal; continue statin, discussed LDL goal <70 Continue low cholesterol diet and exercise.  Check lipid panel.   Diabetes with other circulatory complications Continue medication: metformin, glimepiride 2 mg BID; farxiga was discontinued due to mycotic infections He would prefer to avoid insulin; doing well with ozempic without SE; refilled Perform daily foot/skin check, notify office of any concerning changes.  Check A1C  Overweight with co morbidities  Long discussion about weight loss, diet, and exercise Recommended diet heavy in fruits and veggies and low in animal meats, cheeses, and dairy products, appropriate calorie intake Discussed ideal weight for height and initial weight goal  Patient will work on increasing intentional exercise  Will follow up in 3 months  Vitamin D Def Near goal at last visit; continue supplementation  Defer Vit D level  Tobacco use Discussed risks associated with tobacco use and advised to reduce or quit Patient is  not ready to do so, but advised to consider strongly Will follow up at the next visit    Continue diet and meds as discussed. Further disposition pending results of labs. Discussed med's effects and SE's.   Over 30 minutes of exam, counseling, chart review, and critical decision making was performed.   Future Appointments  Date Time Provider [place]  03/16/2020 10:00 AM [name], NP [facility] None    ----------------------------------------------------------------------------------------------------------------------  HPI 69 y.o. male  presents for 3 month follow up on CAD, hypertension, cholesterol, diabetes, CKD, smoking, weight and vitamin D deficiency.   He is a most day smoker, 5-6 cigarettes some days, down from 1 pack/day for 40 years and consequently with COPD; also OSA with CPAP mouth piece followed by Dr. Toy Cookey but admits he is not wearing daily citing some intermittent discomfort; he is prescribed BREO and has expressed significant benefit with this. He is getting annual CT lung screening, no nodules, due in June 2021.   He has also admitted to excess alcohol intake, 3-6 drinks nightly and has been advised to reduce this intake significantly but hasn't done so.   He reported left arm pain and was seen by Dr. Johnsie Cancel with normal Lexiscan in 07/2019, reports had cortisone shot by Dr. Erlinda Hong last week and doing better.   BMI is Body mass index is 27.16 kg/m., he has been working on diet, he is active at work, as a Water engineer.  Wt Readings from Last 3 Encounters:  09/22/19 192 lb (87.1 kg)  07/24/19 200 lb (90.7 kg)  07/15/19 200 lb (90.7 kg)   He has a history of CAD s/p PCI to [place]; He underwent LHC in2006 after nuc showed inf ischemia;He underwent LHC in 04/18/11 which showed  a proximal LAD 60% (unchanged from previous catheterization), distal LAD 65% (small caliber), Dx 60%, proximal CFX 20%, mid CFX 50%, OM1 30%, proximal RCA 20%, mid RCA stent  patent, EF 65%. Medical therapy recommended. Follows with cardiology - Dr. Johnsie Cancel and colleagues. Had recent repeat lexiscan in 07/2019 which was normal with EF 65%.  His blood pressure has been controlled at home, today their BP is BP: 120/78  He does not workout. He denies chest pain, shortness of breath, dizziness.   He is on cholesterol medication (rosuvastatin 20 mg three days a week) and denies myalgias. His cholesterol is at goal. The cholesterol last visit was:   Lab Results  Component Value Date   CHOL 107 06/12/2019   HDL 39 (L) 06/12/2019   LDLCALC 49 06/12/2019   TRIG 106 06/12/2019   CHOLHDL 2.7 06/12/2019    He has been working on diet and exercise for T2DM (on metformin 2000 mg, glimepiride 2 mg BID, formerly on farxiga but stopped due to yeast infections, recently on ozempic and reports doing well but ran out several weeks ago), and denies foot ulcerations, increased appetite, nausea, paresthesia of the feet, polydipsia, polyuria, visual disturbances, vomiting and weight loss. He does check sporadic fasting glucose, was 130s on ozempic, now around 150-180 off of ozempic. Last A1C in the office was:  Lab Results  Component Value Date   HGBA1C 7.7 (H) 06/12/2019   He has CKD II associated with T2DM monitored at this office:  Lab Results  Component Value Date   [facility] 101 06/12/2019   Patient is on Vitamin D supplement.   Lab Results  Component Value Date   VD25OH 84 03/10/2019        Current Medications:  Current Outpatient Medications on File Prior to Visit  Medication Sig  . acetaminophen (TYLENOL) 500 MG tablet Take 2 tablets (1,000 mg total) by mouth every 6 (six) hours. (Patient taking differently: Take 1,000 mg by mouth as needed. )  . aspirin EC 81 MG tablet Take 1 tablet (81 mg total) by mouth daily.  . benzonatate (TESSALON) 200 MG capsule Take 1 capsule (200 mg total) by mouth 3 (three) times daily as needed. For cough.  . blood glucose meter kit and  supplies Dispense based on patient and insurance preference. Use up to four times daily as directed. (FOR ICD-9 250.00, 250.01).  . blood glucose meter kit and supplies Dispense based on patient and insurance preference. Use up to four times daily as directed. (FOR ICD-10 E10.9, E11.9).  . cetirizine (ZYRTEC) 10 MG tablet Take 10 mg by mouth as needed for allergies.   . Cholecalciferol (VITAMIN D) 50 MCG (2000 UT) tablet Take 1 tablet (2,000 Units total) by mouth daily. (Patient taking differently: Take 10,000 Units by mouth daily. )  . diclofenac (VOLTAREN) 75 MG EC tablet Take one tab po bid x 2 weeks and then take as needed for pain  . doxazosin (CARDURA) 8 MG tablet Take 1 tablet at Bedtime for BP & Prostate  . fexofenadine (ALLEGRA) 60 MG tablet Take 60 mg by mouth daily as needed for allergies or rhinitis.  . finasteride (PROSCAR) 5 MG tablet Take 1 tablet daily for prostate.  . fluticasone furoate-vilanterol (BREO ELLIPTA) 100-25 MCG/INH AEPB INHALE 1 PUFF BY MOUTH ONCE DAILY. RINSE MOUTH WITH WATER AFTER EACH USE.  . gabapentin (NEURONTIN) 100 MG capsule Take 1-3 caps prior to bedtime for sleep and restless legs.  Mar[place] Kitchen glimepiride (AMARYL) 2 MG tablet Take 1  tablet 2 x /day with Meals for Diabetes  . glucose blood test strip Test blood sugars once daily.Dx: E11.9  . metFORMIN (GLUCOPHAGE) 1000 MG tablet Take 1 tablet 2 x /day with Meals for Diabetes  . ramipril (ALTACE) 5 MG capsule Take 1 capsule by mouth once daily  . rosuvastatin (CRESTOR) 20 MG tablet Take 1 tablet by mouth once daily  . Semaglutide, 1 MG/DOSE, (OZEMPIC, 1 MG/DOSE,) 2 MG/1.5ML SOPN Inject 1 mg into the skin once a week.   No current facility-administered medications on file prior to visit.     Allergies: No Known Allergies   Medical History:  Past Medical History:  Diagnosis Date  . CAD (coronary artery disease)    a. s/p stent to RCA 2005; b. LHC 2006 after Nuc with inf ischemia: patent RCA stent, D1 80%, LAD 60%  => med Rx;  c. LHC 04/18/11: Proximal LAD 60% (unchanged from previous catheterization), distal LAD 65% (small caliber), Dx 60%, proximal CFX 20%, mid CFX 50%, OM1 30%, proximal RCA 20%, mid RCA stent patent, EF 65%.=> med Rx  . GERD (gastroesophageal reflux disease)   . HLD (hyperlipidemia)   . Hypertension   . Obesity   . OSA (obstructive sleep apnea)    has a cpap-does not use-has a special mouthpiece he wears at night  . Type II or unspecified type diabetes mellitus without mention of complication, not stated as uncontrolled   . Vitamin D deficiency   . Wears glasses    Family history- Reviewed and unchanged Social history- Reviewed and unchanged   Review of Systems:  Review of Systems  Constitutional: Negative for malaise/fatigue and weight loss.  HENT: Negative for hearing loss and tinnitus.   Eyes: Negative for blurred vision and double vision.  Respiratory: Negative for cough, shortness of breath and wheezing.   Cardiovascular: Negative for chest pain, palpitations, orthopnea, claudication and leg swelling.  Gastrointestinal: Negative for abdominal pain, blood in stool, constipation, diarrhea, heartburn, melena, nausea and vomiting.  Genitourinary: Negative.   Musculoskeletal: Negative for joint pain and myalgias.  Skin: Negative for rash.  Neurological: Negative for dizziness, tingling, sensory change, weakness and headaches.  Endo/Heme/Allergies: Negative for polydipsia.  Psychiatric/Behavioral: Negative.   All other systems reviewed and are negative.   Physical Exam: BP 120/78   Pulse 75   Temp (!) 97.5 F (36.4 C)   Wt 192 lb (87.1 kg)   SpO2 97%   BMI 27.16 kg/m  Wt Readings from Last 3 Encounters:  09/22/19 192 lb (87.1 kg)  07/24/19 200 lb (90.7 kg)  07/15/19 200 lb (90.7 kg)   General Appearance: Well nourished, in no apparent distress. Eyes: PERRLA, EOMs, conjunctiva no swelling or erythema Sinuses: No Frontal/maxillary tenderness ENT/Mouth: Ext aud  canals clear, TMs without erythema, bulging. No erythema, swelling, or exudate on post pharynx.  Tonsils not swollen or erythematous. Hearing normal.  Neck: Supple, thyroid normal.  Respiratory: Respiratory effort normal, BS equal bilaterally without rales, rhonchi, wheezing or stridor.  Cardio: RRR with no MRGs. Brisk peripheral pulses without edema.  Abdomen: Soft, + BS.  Non tender, no guarding, rebound, hernias, masses. Lymphatics: Non tender without lymphadenopathy.  Musculoskeletal: Full ROM, 5/5 strength, Normal gait Skin: Warm, dry without rashes, lesions, ecchymosis.  Neuro: Cranial nerves intact. No cerebellar symptoms.  Psych: Awake and oriented X 3, normal affect, Insight and Judgment appropriate.    Izora Ribas, NP 9:56 AM [facility]

## 2019-09-12 ENCOUNTER — Ambulatory Visit: Payer: Managed Care, Other (non HMO) | Admitting: Adult Health

## 2019-09-12 ENCOUNTER — Ambulatory Visit: Payer: Medicare Other | Admitting: Orthopaedic Surgery

## 2019-09-12 ENCOUNTER — Encounter: Payer: Self-pay | Admitting: Orthopaedic Surgery

## 2019-09-12 ENCOUNTER — Ambulatory Visit: Payer: Self-pay

## 2019-09-12 ENCOUNTER — Other Ambulatory Visit: Payer: Self-pay

## 2019-09-12 DIAGNOSIS — G8929 Other chronic pain: Secondary | ICD-10-CM | POA: Diagnosis not present

## 2019-09-12 DIAGNOSIS — M25512 Pain in left shoulder: Secondary | ICD-10-CM | POA: Diagnosis not present

## 2019-09-12 NOTE — Progress Notes (Signed)
Office Visit Note   Patient: Joe Green           Date of Birth: [birth year]           MRN: [id number] Visit Date: 09/12/2019              Requested by: [name], [address] [place],  [place] [id number] PCP: [name], MD   Assessment & Plan: Visit Diagnoses:  1. Chronic left shoulder pain     Plan: Impression is left shoulder pain on I suspect is combination of partial rotator cuff tear with superimposed adhesive capsulitis.  My recommendation is for intra-articular injection as well as outpatient PT with the patient declined PT and would rather do home exercises.  We provided him a handout on those.  Patient instructed to follow-up in about 6 to 8 weeks if he does not notice any improvement.  Follow-Up Instructions: Return in 6 weeks (on 10/24/2019).   Orders:  Orders Placed This Encounter  Procedures  . XR Shoulder Left  . US Guided Needle Placement - No Linked Charges   No orders of the defined types were placed in this encounter.     Procedures: No procedures performed   Clinical Data: No additional findings.   Subjective: Chief Complaint  Patient presents with  . Left Shoulder - Pain    Joe Green is a very pleasant 69 year old gentleman comes in for evaluation of left shoulder pain since 2020.  He feels the pain mainly in his upper arm.  He is also noted some limitation of range of motion.  He started feeling better about 2 days ago.  He initially could not raise his arm up to the level of the shoulder.  He denies any radicular symptoms.  He has not seen anybody for this issue.  He takes Aleve on a daily basis.   Review of Systems  Constitutional: Negative.   All other systems reviewed and are negative.    Objective: Vital Signs: There were no vitals taken for this visit.  Physical Exam Vitals and nursing note reviewed.  Constitutional:      Appearance: He is well-developed.  HENT:     Head: Normocephalic and  atraumatic.  Eyes:     Pupils: Pupils are equal, round, and reactive to light.  Pulmonary:     Effort: Pulmonary effort is normal.  Abdominal:     Palpations: Abdomen is soft.  Musculoskeletal:        General: Normal range of motion.     Cervical back: Neck supple.  Skin:    General: Skin is warm.  Neurological:     Mental Status: He is alert and oriented to person, place, and time.  Psychiatric:        Behavior: Behavior normal.        Thought Content: Thought content normal.        Judgment: Judgment normal.     Ortho Exam Left shoulder exam shows moderate restriction in range of motion with moderate pain.  Rotator cuff testing is grossly intact with some mild decrease in strength secondary to pain.  Mildly positive impingement. Specialty Comments:  No specialty comments available.  Imaging: US Guided Needle Placement - No Linked Charges  Result Date: 09/12/2019 Please see Notes tab for imaging impression.  XR Shoulder Left  Result Date: 09/12/2019 No acute or structural abnormalities.    PMFS History: Patient Active Problem List   Diagnosis Date Noted  . Closed fracture of  left ankle   . Closed nondisplaced avulsion fracture of tuberosity of left calcaneus   . Rib fractures 01/15/2019  . Overweight (BMI 25.0-29.9) 12/30/2017  . Aortic atherosclerosis ([place]) 12/30/2017  . Acute medial meniscal tear, right, initial encounter 06/25/2017  . Acute medial meniscal tear, left, initial encounter 06/25/2017  . 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 Chondromalacia, right knee 06/25/2017  . Elevated PSA 11/18/2015  . RLS (restless legs syndrome) 10/05/2014  . COPD (chronic obstructive pulmonary disease) ([place]) 10/05/2014  . BPH (benign prostatic hyperplasia) 10/05/2014  . Type 2 diabetes mellitus ([place]) 06/05/2014  . Vitamin D deficiency 04/01/2014  . Medication management 04/01/2014  . Smoking 07/21/2013  . Hypertension   . OSA and COPD overlap syndrome ([place])   .  Hyperlipidemia associated with type 2 diabetes mellitus ([place]) 04/03/2011  . CAD (coronary artery disease) 04/03/2011   Past Medical History:  Diagnosis Date  . CAD (coronary artery disease)    a. s/p stent to RCA 2005; b. LHC 2006 after Nuc with inf ischemia: patent RCA stent, D1 80%, LAD 60% => med Rx;  c. LHC 04/18/11: Proximal LAD 60% (unchanged from previous catheterization), distal LAD 65% (small caliber), Dx 60%, proximal CFX 20%, mid CFX 50%, OM1 30%, proximal RCA 20%, mid RCA stent patent, EF 65%.=> med Rx  . GERD (gastroesophageal reflux disease)   . HLD (hyperlipidemia)   . Hypertension   . Obesity   . OSA (obstructive sleep apnea)    has a cpap-does not use-has a special mouthpiece he wears at night  . Type II or unspecified type diabetes mellitus without mention of complication, not stated as uncontrolled   . Vitamin D deficiency   . Wears glasses     Family History  Problem Relation Age of Onset  . Prostate cancer Father   . Hypertension Father   . Cancer Mother        vaginal?   . Kidney cancer Maternal Grandmother     Past Surgical History:  Procedure Laterality Date  . CARDIAC CATHETERIZATION  2005   stent  . CHONDROPLASTY  06/25/2017   Procedure: CHONDROPLASTY;  Surgeon: Dorna Leitz, MD;  Location: [facility];  Service: Orthopedics;;  . COLONOSCOPY  11/2011   Due 5 years Dr. Fuller Plan  . heart stent  2005   RCA  . INGUINAL HERNIA REPAIR Right 11/16/2014   Procedure: RIGHT INGUINAL HERNIA REPAIR WITH MESH;  Surgeon: Armandina Gemma, MD;  Location: [facility];  Service: General;  Laterality: Right;  . INSERTION OF MESH Right 11/16/2014   Procedure: INSERTION OF MESH;  Surgeon: Armandina Gemma, MD;  Location: [facility];  Service: General;  Laterality: Right;  . KNEE ARTHROSCOPY WITH MEDIAL MENISECTOMY Bilateral 06/25/2017   Procedure: right knee arthroscopy with medial meniscectomy, chondroplasty, Left knee arthroscopy with medial  menisectomy removal of plica and chondroplasty;  Surgeon: Dorna Leitz, MD;  Location: [facility];  Service: Orthopedics;  Laterality: Bilateral;  . POLYPECTOMY     Social History   Occupational History  . Not on file  Tobacco Use  . Smoking status: Current Some Day Smoker    Packs/day: 1.00    Years: 40.00    Pack years: 40.00    Types: Cigarettes  . Smokeless tobacco: Never Used  Substance and Sexual Activity  . Alcohol use: Yes    Alcohol/week: 28.0 standard drinks    Types: 28 Cans of beer per week    Comment: 3-6 per  night  . Drug use: No  . Sexual activity: Yes    Partners: Female    Birth control/protection: Post-menopausal

## 2019-09-12 NOTE — Progress Notes (Signed)
Subjective: Patient is here for ultrasound-guided intra-articular left glenohumeral injection.  He struggles with adhesive capsulitis.  Objective: Left shoulder decreased range of motion with overhead reach.  Procedure: Ultrasound-guided left glenohumeral injection: After sterile prep with Betadine, injected 8 cc 1% lidocaine without epinephrine and 40 mg methylprednisolone using a 22-gauge spinal needle, passing the needle through approach into the glenohumeral joint.  Injectate seen filling the joint capsule.  Good pain relief immediately but no change in range of motion.

## 2019-09-22 ENCOUNTER — Encounter: Payer: Self-pay | Admitting: Adult Health

## 2019-09-22 ENCOUNTER — Ambulatory Visit (INDEPENDENT_AMBULATORY_CARE_PROVIDER_SITE_OTHER): Payer: Medicare Other | Admitting: Adult Health

## 2019-09-22 ENCOUNTER — Other Ambulatory Visit: Payer: Self-pay

## 2019-09-22 VITALS — BP 120/78 | HR 75 | Temp 97.5°F | Wt 192.0 lb

## 2019-09-22 DIAGNOSIS — F17200 Nicotine dependence, unspecified, uncomplicated: Secondary | ICD-10-CM

## 2019-09-22 DIAGNOSIS — I10 Essential (primary) hypertension: Secondary | ICD-10-CM | POA: Diagnosis not present

## 2019-09-22 DIAGNOSIS — G4733 Obstructive sleep apnea (adult) (pediatric): Secondary | ICD-10-CM | POA: Diagnosis not present

## 2019-09-22 DIAGNOSIS — E1169 Type 2 diabetes mellitus with other specified complication: Secondary | ICD-10-CM

## 2019-09-22 DIAGNOSIS — E785 Hyperlipidemia, unspecified: Secondary | ICD-10-CM

## 2019-09-22 DIAGNOSIS — E1159 Type 2 diabetes mellitus with other circulatory complications: Secondary | ICD-10-CM

## 2019-09-22 DIAGNOSIS — J449 Chronic obstructive pulmonary disease, unspecified: Secondary | ICD-10-CM | POA: Diagnosis not present

## 2019-09-22 DIAGNOSIS — E663 Overweight: Secondary | ICD-10-CM

## 2019-09-22 DIAGNOSIS — I25119 Atherosclerotic heart disease of native coronary artery with unspecified angina pectoris: Secondary | ICD-10-CM | POA: Diagnosis not present

## 2019-09-22 DIAGNOSIS — I700 Atherosclerosis of aorta: Secondary | ICD-10-CM | POA: Diagnosis not present

## 2019-09-22 DIAGNOSIS — Z79899 Other long term (current) drug therapy: Secondary | ICD-10-CM

## 2019-09-22 DIAGNOSIS — E559 Vitamin D deficiency, unspecified: Secondary | ICD-10-CM

## 2019-09-22 MED ORDER — OZEMPIC (1 MG/DOSE) 2 MG/1.5ML ~~LOC~~ SOPN: 1.0000 mg | PEN_INJECTOR | SUBCUTANEOUS | 1 refills | Status: DC

## 2019-09-22 NOTE — Patient Instructions (Addendum)
Goals    . Exercise 150 minutes per week (moderate activity)    . fasting glucose <130    . HEMOGLOBIN A1C < 7    . LDL CALC < 70       Try to wear your mouth piece daily, at least 4 hours a night  If having lots of problems follow up with Dr. Toy Cookey    Please restart ozempic, continue weekly, call back for refill if you run out or any other problems. Need to take consistently for this medication to work    Semaglutide injection solution What is this medicine? SEMAGLUTIDE (Sem a GLOO tide) is used to improve blood sugar control in adults with type 2 diabetes. This medicine may be used with other diabetes medicines. This drug may also reduce the risk of heart attack or stroke if you have type 2 diabetes and risk factors for heart disease. This medicine may be used for other purposes; ask your health care provider or pharmacist if you have questions. COMMON BRAND NAME(S): OZEMPIC What should I tell my health care provider before I take this medicine? They need to know if you have any of these conditions:  endocrine tumors (MEN 2) or if someone in your family had these tumors  eye disease, vision problems  history of pancreatitis  kidney disease  stomach problems  thyroid cancer or if someone in your family had thyroid cancer  an unusual or allergic reaction to semaglutide, other medicines, foods, dyes, or preservatives  pregnant or trying to get pregnant  breast-feeding How should I use this medicine? This medicine is for injection under the skin of your upper leg (thigh), stomach area, or upper arm. It is given once every week (every 7 days). You will be taught how to prepare and give this medicine. Use exactly as directed. Take your medicine at regular intervals. Do not take it more often than directed. If you use this medicine with insulin, you should inject this medicine and the insulin separately. Do not mix them together. Do not give the injections right next to each  other. Change (rotate) injection sites with each injection. It is important that you put your used needles and syringes in a special sharps container. Do not put them in a trash can. If you do not have a sharps container, call your pharmacist or healthcare provider to get one. A special MedGuide will be given to you by the pharmacist with each prescription and refill. Be sure to read this information carefully each time. This drug comes with INSTRUCTIONS FOR USE. Ask your pharmacist for directions on how to use this drug. Read the information carefully. Talk to your pharmacist or health care provider if you have questions. Talk to your pediatrician regarding the use of this medicine in children. Special care may be needed. Overdosage: If you think you have taken too much of this medicine contact a poison control center or emergency room at once. NOTE: This medicine is only for you. Do not share this medicine with others. What if I miss a dose? If you miss a dose, take it as soon as you can within 5 days after the missed dose. Then take your next dose at your regular weekly time. If it has been longer than 5 days after the missed dose, do not take the missed dose. Take the next dose at your regular time. Do not take double or extra doses. If you have questions about a missed dose, contact your health care  provider for advice. What may interact with this medicine?  other medicines for diabetes Many medications may cause changes in blood sugar, these include:  alcohol containing beverages  antiviral medicines for HIV or AIDS  aspirin and aspirin-like drugs  certain medicines for blood pressure, heart disease, irregular heart beat  chromium  diuretics  male hormones, such as estrogens or progestins, birth control pills  fenofibrate  gemfibrozil  isoniazid  lanreotide  male hormones or anabolic steroids  MAOIs like Carbex, Eldepryl, Marplan, Nardil, and Parnate  medicines for  weight loss  medicines for allergies, asthma, cold, or cough  medicines for depression, anxiety, or psychotic disturbances  niacin  nicotine  NSAIDs, medicines for pain and inflammation, like ibuprofen or naproxen  octreotide  pasireotide  pentamidine  phenytoin  probenecid  quinolone antibiotics such as ciprofloxacin, levofloxacin, ofloxacin  some herbal dietary supplements  steroid medicines such as prednisone or cortisone  sulfamethoxazole; trimethoprim  thyroid hormones Some medications can hide the warning symptoms of low blood sugar (hypoglycemia). You may need to monitor your blood sugar more closely if you are taking one of these medications. These include:  beta-blockers, often used for high blood pressure or heart problems (examples include atenolol, metoprolol, propranolol)  clonidine  guanethidine  reserpine This list may not describe all possible interactions. Give your health care provider a list of all the medicines, herbs, non-prescription drugs, or dietary supplements you use. Also tell them if you smoke, drink alcohol, or use illegal drugs. Some items may interact with your medicine. What should I watch for while using this medicine? Visit your doctor or health care professional for regular checks on your progress. Drink plenty of fluids while taking this medicine. Check with your doctor or health care professional if you get an attack of severe diarrhea, nausea, and vomiting. The loss of too much body fluid can make it dangerous for you to take this medicine. A test called the HbA1C (A1C) will be monitored. This is a simple blood test. It measures your blood sugar control over the last 2 to 3 months. You will receive this test every 3 to 6 months. Learn how to check your blood sugar. Learn the symptoms of low and high blood sugar and how to manage them. Always carry a quick-source of sugar with you in case you have symptoms of low blood sugar. Examples  include hard sugar candy or glucose tablets. Make sure others know that you can choke if you eat or drink when you develop serious symptoms of low blood sugar, such as seizures or unconsciousness. They must get medical help at once. Tell your doctor or health care professional if you have high blood sugar. You might need to change the dose of your medicine. If you are sick or exercising more than usual, you might need to change the dose of your medicine. Do not skip meals. Ask your doctor or health care professional if you should avoid alcohol. Many nonprescription cough and cold products contain sugar or alcohol. These can affect blood sugar. Pens should never be shared. Even if the needle is changed, sharing may result in passing of viruses like hepatitis or HIV. Wear a medical ID bracelet or chain, and carry a card that describes your disease and details of your medicine and dosage times. Do not become pregnant while taking this medicine. Women should inform their doctor if they wish to become pregnant or think they might be pregnant. There is a potential for serious side effects to  an unborn child. Talk to your health care professional or pharmacist for more information. What side effects may I notice from receiving this medicine? Side effects that you should report to your doctor or health care professional as soon as possible:  allergic reactions like skin rash, itching or hives, swelling of the face, lips, or tongue  breathing problems  changes in vision  diarrhea that continues or is severe  lump or swelling on the neck  severe nausea  signs and symptoms of infection like fever or chills; cough; sore throat; pain or trouble passing urine  signs and symptoms of low blood sugar such as feeling anxious, confusion, dizziness, increased hunger, unusually weak or tired, sweating, shakiness, cold, irritable, headache, blurred vision, fast heartbeat, loss of consciousness  signs and symptoms  of kidney injury like trouble passing urine or change in the amount of urine  trouble swallowing  unusual stomach upset or pain  vomiting Side effects that usually do not require medical attention (report to your doctor or health care professional if they continue or are bothersome):  constipation  diarrhea  nausea  pain, redness, or irritation at site where injected  stomach upset This list may not describe all possible side effects. Call your doctor for medical advice about side effects. You may report side effects to FDA at 1-800-FDA-1088. Where should I keep my medicine? Keep out of the reach of children. Store unopened pens in a refrigerator between 2 and 8 degrees C (36 and 46 degrees F). Do not freeze. Protect from light and heat. After you first use the pen, it can be stored for 56 days at room temperature between 15 and 30 degrees C (59 and 86 degrees F) or in a refrigerator. Throw away your used pen after 56 days or after the expiration date, whichever comes first. Do not store your pen with the needle attached. If the needle is left on, medicine may leak from the pen. NOTE: This sheet is a summary. It may not cover all possible information. If you have questions about this medicine, talk to your doctor, pharmacist, or health care provider.  2020 Elsevier/Gold Standard (2019-04-15 09:41:51)    SMOKING CESSATION  [employer] cancer society  GE:610463 for more information or for a free program for smoking cessation help.   You can call QUIT SMART 1-800-QUIT-NOW for free nicotine patches or replacement therapy- if they are out- keep calling  [facility] Can call for smoking cessation classes, ([phone]  If you have a smart phone, please look up Smoke Free app, this will help you stay on track and give you information about money you have saved, life that you have gained back and a ton of more information.     ADVANTAGES OF QUITTING SMOKING  Within  20 minutes, blood pressure decreases. Your pulse is at normal level.  After 8 hours, carbon monoxide levels in the blood return to normal. Your oxygen level increases.  After 24 hours, the chance of having a heart attack starts to decrease. Your breath, hair, and body stop smelling like smoke.  After 48 hours, damaged nerve endings begin to recover. Your sense of taste and smell improve.  After 72 hours, the body is virtually free of nicotine. Your bronchial tubes relax and breathing becomes easier.  After 2 to 12 weeks, lungs can hold more air. Exercise becomes easier and circulation improves.  After 1 year, the risk of coronary heart disease is cut in half.  After 5 years,  the risk of stroke falls to the same as a nonsmoker.  After 10 years, the risk of lung cancer is cut in half and the risk of other cancers decreases significantly.  After 15 years, the risk of coronary heart disease drops, usually to the level of a nonsmoker.  You will have extra money to spend on things other than cigarettes.

## 2019-09-23 LAB — COMPLETE METABOLIC PANEL WITH GFR
AG Ratio: 2 (calc) (ref 1.0–2.5)
ALT: 22 U/L (ref 9–46)
AST: 12 U/L (ref 10–35)
Albumin: 4.5 g/dL (ref 3.6–5.1)
Alkaline phosphatase (APISO): 83 U/L (ref 35–144)
BUN/Creatinine Ratio: 22 (calc) (ref 6–22)
BUN: 14 mg/dL (ref 7–25)
CO2: 26 mmol/L (ref 20–32)
Calcium: 9.3 mg/dL (ref 8.6–10.3)
Chloride: 103 mmol/L (ref 98–110)
Creat: 0.64 mg/dL — ABNORMAL LOW (ref 0.70–1.25)
GFR, Est African American: 117 mL/min/{1.73_m2} (ref >=60)
GFR, Est Non African American: 101 mL/min/{1.73_m2} (ref >=60)
Globulin: 2.3 g/dL (calc) (ref 1.9–3.7)
Glucose, Bld: 218 mg/dL — ABNORMAL HIGH (ref 65–99)
Potassium: 4.6 mmol/L (ref 3.5–5.3)
Sodium: 136 mmol/L (ref 135–146)
Total Bilirubin: 0.6 mg/dL (ref 0.2–1.2)
Total Protein: 6.8 g/dL (ref 6.1–8.1)

## 2019-09-23 LAB — CBC WITH DIFFERENTIAL/PLATELET
Absolute Monocytes: 725 cells/uL (ref 200–950)
Basophils Absolute: 70 cells/uL (ref 0–200)
Basophils Relative: 0.9 %
Eosinophils Absolute: 476 cells/uL (ref 15–500)
Eosinophils Relative: 6.1 %
HCT: 45.2 % (ref 38.5–50.0)
Hemoglobin: 15.2 g/dL (ref 13.2–17.1)
Lymphs Abs: 1677 cells/uL (ref 850–3900)
MCH: 31.1 pg (ref 27.0–33.0)
MCHC: 33.6 g/dL (ref 32.0–36.0)
MCV: 92.6 fL (ref 80.0–100.0)
MPV: 11.7 fL (ref 7.5–12.5)
Monocytes Relative: 9.3 %
Neutro Abs: 4852 cells/uL (ref 1500–7800)
Neutrophils Relative %: 62.2 %
Platelets: 186 10*3/uL (ref 140–400)
RBC: 4.88 10*6/uL (ref 4.20–5.80)
RDW: 11.8 % (ref 11.0–15.0)
Total Lymphocyte: 21.5 %
WBC: 7.8 10*3/uL (ref 3.8–10.8)

## 2019-09-23 LAB — MAGNESIUM: Magnesium: 2 mg/dL (ref 1.5–2.5)

## 2019-09-23 LAB — LIPID PANEL
Cholesterol: 116 mg/dL (ref <200)
HDL: 51 mg/dL (ref >=40)
LDL Cholesterol (Calc): 49 mg/dL (calc)
Non-HDL Cholesterol (Calc): 65 mg/dL (calc) (ref <130)
Total CHOL/HDL Ratio: 2.3 (calc) (ref <5.0)
Triglycerides: 80 mg/dL (ref <150)

## 2019-09-23 LAB — HEMOGLOBIN A1C
Hgb A1c MFr Bld: 7 % of total Hgb — ABNORMAL HIGH (ref <5.7)
Mean Plasma Glucose: 154 (calc)
eAG (mmol/L): 8.5 (calc)

## 2019-09-23 LAB — TSH: TSH: 1.9 mIU/L (ref 0.40–4.50)

## 2019-09-23 NOTE — Progress Notes (Signed)
PATIENT IS AWARE OF LAB RESULTS AND INSTRUCTIONS. -M TUCKER

## 2019-10-12 ENCOUNTER — Other Ambulatory Visit: Payer: Self-pay | Admitting: Adult Health

## 2019-10-27 ENCOUNTER — Encounter: Payer: Self-pay | Admitting: Adult Health

## 2019-12-07 ENCOUNTER — Other Ambulatory Visit: Payer: Self-pay | Admitting: Adult Health

## 2019-12-07 ENCOUNTER — Other Ambulatory Visit: Payer: Self-pay | Admitting: Internal Medicine

## 2019-12-07 DIAGNOSIS — N320 Bladder-neck obstruction: Secondary | ICD-10-CM

## 2019-12-25 NOTE — Progress Notes (Deleted)
FOLLOW UP  Assessment and Plan:   Aortic atherosclerosis ([place]) Per CT 2020 Control blood pressure, cholesterol, glucose, increase exercise.   Coronary artery disease involving native heart with angina pectoris, unspecified vessel or lesion type ([place]) Control blood pressure, cholesterol, glucose, increase exercise.  Continue cardio follow up Stop smoking  OSA and COPD overlap syndrome ([facility]) Wears mouthpiece, encouraged compliance, risks discussed, follow up by Dr. Toy Cookey if needed  Chronic obstructive pulmonary disease, unspecified COPD type ([place]) Continue inhalers, stop smoking, has recent CT lung 01/2019  Hypertension Well controlled with current medications  Monitor blood pressure at home; patient to call if consistently greater than 130/80 Continue DASH diet.   Reminder to go to the ER if any CP, SOB, nausea, dizziness, severe HA, changes vision/speech, left arm numbness and tingling and jaw pain.  Cholesterol Currently at goal; continue statin, discussed LDL goal <70 Continue low cholesterol diet and exercise.  Check lipid panel.   Diabetes with other circulatory complications Continue medication: metformin, glimepiride 2 mg BID; farxiga was discontinued due to mycotic infections He would prefer to avoid insulin; doing well with ozempic without SE; refilled Perform daily foot/skin check, notify office of any concerning changes.  Check A1C  Overweight with co morbidities  Long discussion about weight loss, diet, and exercise Recommended diet heavy in fruits and veggies and low in animal meats, cheeses, and dairy products, appropriate calorie intake Discussed ideal weight for height and initial weight goal  Patient will work on increasing intentional exercise  Will follow up in 3 months  Vitamin D Def Near goal at last visit; continue supplementation  Defer Vit D level  Tobacco use Discussed risks associated with tobacco use and advised to reduce or quit Patient is  not ready to do so, but advised to consider strongly Will follow up at the next visit    Continue diet and meds as discussed. Further disposition pending results of labs. Discussed med's effects and SE's.   Over 30 minutes of exam, counseling, chart review, and critical decision making was performed.   Future Appointments  Date Time Provider [place]  12/29/2019  9:30 AM [name], NP [facility] None  03/31/2020  9:00 AM [name], NP [facility] None    ----------------------------------------------------------------------------------------------------------------------  HPI 69 y.o. male  presents for 3 month follow up on CAD, hypertension, cholesterol, diabetes, CKD, smoking, weight and vitamin D deficiency.   He is a most day smoker, 5-6 cigarettes some days, down from 1 pack/day for 40 years and consequently with COPD; also OSA with CPAP mouth piece followed by Dr. Toy Cookey but admits he is not wearing daily citing some intermittent discomfort; he is prescribed BREO and has expressed significant benefit with this. He is getting annual CT lung screening, no nodules, due in June 2021. ***  He has also admitted to excess alcohol intake, 3-6 drinks nightly and has been advised to reduce this intake significantly but hasn't done so.   He reported left arm pain and was seen by Dr. Johnsie Cancel with normal Lexiscan in 07/2019, had cortisone shot by Dr. Erlinda Hong and doing better.    *** review ortho diagnoses  BMI is There is no height or weight on file to calculate BMI., he has been working on diet, he is active at work, as a Water engineer.  Wt Readings from Last 3 Encounters:  09/22/19 192 lb (87.1 kg)  07/24/19 200 lb (90.7 kg)  07/15/19 200 lb (90.7 kg)   He has a history of CAD s/p  PCI to RCA [id number]; He underwent LHC in2006 after nuc showed inf ischemia;He underwent LHC in 04/18/11 which showed a proximal LAD 60% (unchanged from previous catheterization), distal LAD 65%  (small caliber), Dx 60%, proximal CFX 20%, mid CFX 50%, OM1 30%, proximal RCA 20%, mid RCA stent patent, EF 65%. Medical therapy recommended. Follows with cardiology - Dr. Johnsie Cancel and colleagues. Had recent repeat lexiscan in 07/2019 which was normal with EF 65%.  His blood pressure has been controlled at home, today their BP is    He does not workout. He denies chest pain, shortness of breath, dizziness.   He is on cholesterol medication (rosuvastatin 20 mg three days a week) and denies myalgias. His cholesterol is at goal. The cholesterol last visit was:   Lab Results  Component Value Date   CHOL 116 09/22/2019   HDL 51 09/22/2019   LDLCALC 49 09/22/2019   TRIG 80 09/22/2019   CHOLHDL 2.3 09/22/2019    He has been working on diet and exercise for T2DM (on metformin 2000 mg, glimepiride 2 mg BID, formerly on farxiga but stopped due to yeast infections, recently on ozempic and reports doing well but ran out several weeks ago ***), and denies foot ulcerations, increased appetite, nausea, paresthesia of the feet, polydipsia, polyuria, visual disturbances, vomiting and weight loss. He does check sporadic fasting glucose, was 130s on ozempic, now around 150-180 off of ozempic. Last A1C in the office was:  Lab Results  Component Value Date   HGBA1C 7.0 (H) 09/22/2019   Last GFR:  Lab Results  Component Value Date   [facility] 101 09/22/2019   Patient is on Vitamin D supplement.   Lab Results  Component Value Date   VD25OH 84 03/10/2019        Current Medications:  Current Outpatient Medications on File Prior to Visit  Medication Sig  . acetaminophen (TYLENOL) 500 MG tablet Take 2 tablets (1,000 mg total) by mouth every 6 (six) hours. (Patient taking differently: Take 1,000 mg by mouth as needed. )  . aspirin EC 81 MG tablet Take 1 tablet (81 mg total) by mouth daily.  . benzonatate (TESSALON) 200 MG capsule Take 1 capsule (200 mg total) by mouth 3 (three) times daily as needed. For cough.   . blood glucose meter kit and supplies Dispense based on patient and insurance preference. Use up to four times daily as directed. (FOR ICD-9 250.00, 250.01).  . blood glucose meter kit and supplies Dispense based on patient and insurance preference. Use up to four times daily as directed. (FOR ICD-10 E10.9, E11.9).  . cetirizine (ZYRTEC) 10 MG tablet Take 10 mg by mouth as needed for allergies.   . Cholecalciferol (VITAMIN D) 50 MCG (2000 UT) tablet Take 1 tablet (2,000 Units total) by mouth daily. (Patient taking differently: Take 10,000 Units by mouth daily. )  . diclofenac (VOLTAREN) 75 MG EC tablet Take one tab po bid x 2 weeks and then take as needed for pain  . doxazosin (CARDURA) 8 MG tablet Take 1 tablet  at Bedtime for BP & Prostate  . fexofenadine (ALLEGRA) 60 MG tablet Take 60 mg by mouth daily as needed for allergies or rhinitis.  . finasteride (PROSCAR) 5 MG tablet Take 1 tablet daily for prostate.  . fluticasone furoate-vilanterol (BREO ELLIPTA) 100-25 MCG/INH AEPB INHALE 1 PUFF BY MOUTH ONCE DAILY. RINSE MOUTH WITH WATER AFTER EACH USE.  . gabapentin (NEURONTIN) 100 MG capsule Take 1 to 3 capsules at Bedtime for Sleep &  Restless Legs  . glimepiride (AMARYL) 2 MG tablet Take 1 tablet 2 x /day with Meals for Diabetes  . glucose blood test strip Test blood sugars once daily.Dx: E11.9  . metFORMIN (GLUCOPHAGE) 1000 MG tablet Take 1 tablet 2 x /day with Meals for Diabetes  . ramipril (ALTACE) 5 MG capsule Take 1 capsule Daily  . rosuvastatin (CRESTOR) 20 MG tablet Take 1 tablet by mouth once daily  . Semaglutide, 1 MG/DOSE, (OZEMPIC, 1 MG/DOSE,) 2 MG/1.5ML SOPN Inject 1 mg into the skin once a week.   No current facility-administered medications on file prior to visit.     Allergies: No Known Allergies   Medical History:  Past Medical History:  Diagnosis Date  . CAD (coronary artery disease)    a. s/p stent to RCA 2005; b. LHC 2006 after Nuc with inf ischemia: patent RCA  stent, D1 80%, LAD 60% => med Rx;  c. LHC 04/18/11: Proximal LAD 60% (unchanged from previous catheterization), distal LAD 65% (small caliber), Dx 60%, proximal CFX 20%, mid CFX 50%, OM1 30%, proximal RCA 20%, mid RCA stent patent, EF 65%.=> med Rx  . GERD (gastroesophageal reflux disease)   . HLD (hyperlipidemia)   . Hypertension   . Obesity   . OSA (obstructive sleep apnea)    has a cpap-does not use-has a special mouthpiece he wears at night  . Type II or unspecified type diabetes mellitus without mention of complication, not stated as uncontrolled   . Vitamin D deficiency   . Wears glasses    Family history- Reviewed and unchanged Social history- Reviewed and unchanged   Review of Systems:  Review of Systems  Constitutional: Negative for malaise/fatigue and weight loss.  HENT: Negative for hearing loss and tinnitus.   Eyes: Negative for blurred vision and double vision.  Respiratory: Negative for cough, shortness of breath and wheezing.   Cardiovascular: Negative for chest pain, palpitations, orthopnea, claudication and leg swelling.  Gastrointestinal: Negative for abdominal pain, blood in stool, constipation, diarrhea, heartburn, melena, nausea and vomiting.  Genitourinary: Negative.   Musculoskeletal: Negative for joint pain and myalgias.  Skin: Negative for rash.  Neurological: Negative for dizziness, tingling, sensory change, weakness and headaches.  Endo/Heme/Allergies: Negative for polydipsia.  Psychiatric/Behavioral: Negative.   All other systems reviewed and are negative.   Physical Exam: There were no vitals taken for this visit. Wt Readings from Last 3 Encounters:  09/22/19 192 lb (87.1 kg)  07/24/19 200 lb (90.7 kg)  07/15/19 200 lb (90.7 kg)   General Appearance: Well nourished, in no apparent distress. Eyes: PERRLA, EOMs, conjunctiva no swelling or erythema Sinuses: No Frontal/maxillary tenderness ENT/Mouth: Ext aud canals clear, TMs without erythema, bulging.  No erythema, swelling, or exudate on post pharynx.  Tonsils not swollen or erythematous. Hearing normal.  Neck: Supple, thyroid normal.  Respiratory: Respiratory effort normal, BS equal bilaterally without rales, rhonchi, wheezing or stridor.  Cardio: RRR with no MRGs. Brisk peripheral pulses without edema.  Abdomen: Soft, + BS.  Non tender, no guarding, rebound, hernias, masses. Lymphatics: Non tender without lymphadenopathy.  Musculoskeletal: Full ROM, 5/5 strength, Normal gait Skin: Warm, dry without rashes, lesions, ecchymosis.  Neuro: Cranial nerves intact. No cerebellar symptoms.  Psych: Awake and oriented X 3, normal affect, Insight and Judgment appropriate.    Izora Ribas, NP 1:37 PM [facility]

## 2019-12-29 ENCOUNTER — Ambulatory Visit: Payer: Medicare Other | Admitting: Adult Health

## 2020-01-08 ENCOUNTER — Encounter: Payer: BLUE CROSS/BLUE SHIELD | Admitting: Adult Health

## 2020-01-08 NOTE — Progress Notes (Signed)
MEDICARE ANNUAL WELLNESS VISIT AND FOLLOW UP Assessment:   Joe Green was seen today for medicare wellness and follow-up.  Diagnoses and all orders for this visit:  Welcome to Medicare preventive visit Patient will call GI to reschedule colonoscopy Diabetes eye exam and dental visits are scheduled CT lung screening ordered Pending receipt of covid 19 vaccine documentation Information for shingrix given - will check with insurance -     EKG 12-Lead -     [place], RETROPERITNL ABD,  LTD  Aortic atherosclerosis ([place]) Per CT 2020 Control blood pressure, cholesterol, glucose, increase exercise.  -     Lipid panel  Coronary artery disease involving native heart with angina pectoris, unspecified vessel or lesion type [facility]) Follows with cardiology, denies sx, low risk lexiscan in 07/2019 Control blood pressure, cholesterol, glucose, increase exercise.  -     Lipid panel -     EKG 12-Lead  Essential hypertension Continue medication Monitor blood pressure at home; call if consistently over 130/80 Continue DASH diet.   Reminder to go to the ER if any CP, SOB, nausea, dizziness, severe HA, changes vision/speech, left arm numbness and tingling and jaw pain. -     CBC with Differential/Platelet -     COMPLETE METABOLIC PANEL WITH GFR -     Magnesium  OSA and COPD overlap syndrome (HCC) Encouraged CPAP compliance, pt states will retry with new mask  Chronic obstructive pulmonary disease, unspecified COPD type ([place]) STOP smoking, Breo is working well   Type 2 diabetes mellitus with other circulatory complication, without long-term current use of insulin ([place]) Education: Reviewed 'ABCs' of diabetes management (respective goals in parentheses):  A1C (<7), blood pressure (<130/80), and cholesterol (LDL <70) Eye Exam yearly and Dental Exam every 6 months - has scheduled, report requested once completed  Dietary recommendations Physical Activity recommendations Fasting is fairly above goal; cost  limitations; will increase glimepiride from 2 mg BID to 4 mg, increase slowly - whole tab in AM first, then increase PM monitoring closely for hypoglycemia Given instructions for hypoglycemia management  -     glimepiride (AMARYL) 4 MG tablet; Take 1/2- 1 tablet 2 x /day with Meals for Diabetes -     Hemoglobin A1c  Hyperlipidemia associated with type 2 diabetes mellitus ([place]) Continue statin for LDL goal <70 Continue low cholesterol diet and exercise.  Check lipid panel. -     Lipid panel -     TSH  Benign prostatic hyperplasia with urinary frequency -     finasteride (PROSCAR) 5 MG tablet; Take 1 tablet daily for prostate.  Vitamin D deficiency Continue supplement for goal of 60-100; check at CPE and as needed  Smoking Discussed risks associated with tobacco use and advised to reduce or quit Patient is ready to do so and plans to quit with chantix this summer - has script at home Prescription for chantix with information provided Will follow up at the next visit Annual lung cancer screening due June 2021 -     CT CHEST LUNG CA SCREEN LOW DOSE W/O CM; Future  RLS (restless legs syndrome) Will try gabapentin 100-300 mg at night more consistently, increase walking If persistent will try requip  Overweight (BMI 25.0-29.9) Long discussion about weight loss, diet, and exercise Recommended diet heavy in fruits and veggies and low in animal meats, cheeses, and dairy products, appropriate calorie intake Patient will work on exercise program  Discussed appropriate weight for height and initial goal (<190 lb) Follow up at next visit  Medication management -     CBC with Differential/Platelet -     COMPLETE METABOLIC PANEL WITH GFR -     Magnesium  Elevated PSA Monitoring annually at CPE  Screening for AAA (abdominal aortic aneurysm) -     [place], RETROPERITNL ABD,  LTD  Other orders -     ramipril (ALTACE) 5 MG capsule; Take 1 capsule Daily -     fluticasone furoate-vilanterol  (BREO ELLIPTA) 100-25 MCG/INH AEPB; INHALE 1 PUFF BY MOUTH ONCE DAILY. RINSE MOUTH WITH WATER AFTER EACH USE. -     rosuvastatin (CRESTOR) 20 MG tablet; Take 1 tab three nights a week for cholesterol.   Over 30 minutes of exam, counseling, chart review, and critical decision making was performed  Future Appointments  Date Time Provider [place]  04/23/2020  9:30 AM [name], NP [facility] None  01/11/2021  9:00 AM [name], NP [facility] None  01/13/2021 10:00 AM [name], NP [facility] None     Plan:   During the course of the visit the patient was educated and counseled about appropriate screening and preventive services including:    Pneumococcal vaccine   Influenza vaccine  Prevnar 13  Td vaccine  Screening electrocardiogram  Colorectal cancer screening  Diabetes screening  Glaucoma screening  Nutrition counseling    Subjective:  Joe Green is a 69 y.o. male who presents for Medicare Annual Wellness Visit and 3 month follow up for HTN, hyperlipidemia, T2DM, and vitamin D Def.   He is married with 3 kids, 1 is his. 8 or 9 Grandchildren, not sure. Just retired yesterday from [employer], job of 33 years. No plans in retirement, wants to set up a gym at home and exercise more.   Follows with Dr. Erlinda Hong for ortho concerns - had shoulder injection and doing well, knees do bother him some, takes voltaren PRN.   he is a smoker, cutting down, but admits smoking average 1/2 pack; 40 pack year history;  He had Ct lung in 01/2019 which did not show any concerning nodules.  He does have COPD and is on Breo which works well for him.   OSA, intolerant of CPAP and had been using mouth piece but he feels didn't work well; managed by Dr. Toy Cookey, has been recommended repeat sleep study but patient adamantly declines repeat. He plans to try again with mask, still has CPAP.   He has RLS, had been declining meds but having some trouble  sleeping, wants to try gabapentin more regularly now that retired, couldn't tolerate while was working due to early morning.   BMI is Body mass index is 27.98 kg/m., he has been working on diet, plans to start exercise program now that retired, goal to get <190 lb.  Wt Readings from Last 3 Encounters:  01/09/20 197 lb 12.8 oz (89.7 kg)  09/22/19 192 lb (87.1 kg)  07/24/19 200 lb (90.7 kg)   He has a history of CAD s/p PCI to [place]; He underwent LHC in2006 after nuc showed inf ischemia;He underwent LHC in 04/18/11 which showed a proximal LAD 60% (unchanged from previous catheterization), distal LAD 65% (small caliber), Dx 60%, proximal CFX 20%, mid CFX 50%, OM1 30%, proximal RCA 20%, mid RCA stent patent, EF 65%. Medical therapy recommended. Follows with cardiology - Dr. Johnsie Cancel and colleagues. Had recent repeat lexiscan in 07/2019 which was normal with EF 65%.  His blood pressure has been controlled at home, today their BP is BP: 120/70  He does workout. He denies chest pain, shortness of breath, dizziness.   He is on cholesterol medication (20 mg three days a week) and denies myalgias. His cholesterol is at goal. The cholesterol last visit was:   Lab Results  Component Value Date   CHOL 116 09/22/2019   HDL 51 09/22/2019   LDLCALC 49 09/22/2019   TRIG 80 09/22/2019   CHOLHDL 2.3 09/22/2019   He has been working on diet and exercise for T2DM (on metformin 2000 mg, glimepiride 2 mg BID, formerly on farxiga but stopped due to yeast infections, ozempic was expensive), and denies foot ulcerations, increased appetite, nausea, paresthesia of the feet, polydipsia, polyuria, visual disturbances, vomiting and weight loss.  He does check sporadic fasting glucose, was 130s on ozempic, now around 150-170 off of ozempic. Last A1C in the office was:  Lab Results  Component Value Date   HGBA1C 7.0 (H) 09/22/2019   Last GFR Lab Results  Component Value Date   [facility] 101 09/22/2019    Patient  is on Vitamin D supplement.   Lab Results  Component Value Date   VD25OH 84 03/10/2019       Medication Review:  Current Outpatient Medications (Endocrine & Metabolic):  .  glimepiride (AMARYL) 4 MG tablet, Take 1/2- 1 tablet 2 x /day with Meals for Diabetes .  metFORMIN (GLUCOPHAGE) 1000 MG tablet, Take 1 tablet 2 x /day with Meals for Diabetes .  Semaglutide, 1 MG/DOSE, (OZEMPIC, 1 MG/DOSE,) 2 MG/1.5ML SOPN, Inject 1 mg into the skin once a week. (Patient not taking: Reported on 01/09/2020)  Current Outpatient Medications (Cardiovascular):  .  doxazosin (CARDURA) 8 MG tablet, Take 1 tablet  at Bedtime for BP & Prostate .  ramipril (ALTACE) 5 MG capsule, Take 1 capsule Daily .  rosuvastatin (CRESTOR) 20 MG tablet, Take 1 tab three nights a week for cholesterol.  Current Outpatient Medications (Respiratory):  .  benzonatate (TESSALON) 200 MG capsule, Take 1 capsule (200 mg total) by mouth 3 (three) times daily as needed. For cough. .  cetirizine (ZYRTEC) 10 MG tablet, Take 10 mg by mouth as needed for allergies.  .  fexofenadine (ALLEGRA) 60 MG tablet, Take 60 mg by mouth daily as needed for allergies or rhinitis. .  fluticasone furoate-vilanterol (BREO ELLIPTA) 100-25 MCG/INH AEPB, INHALE 1 PUFF BY MOUTH ONCE DAILY. RINSE MOUTH WITH WATER AFTER EACH USE.  Current Outpatient Medications (Analgesics):  .  acetaminophen (TYLENOL) 500 MG tablet, Take 2 tablets (1,000 mg total) by mouth every 6 (six) hours. (Patient taking differently: Take 1,000 mg by mouth as needed. ) .  aspirin EC 81 MG tablet, Take 1 tablet (81 mg total) by mouth daily. .  diclofenac (VOLTAREN) 75 MG EC tablet, Take one tab po bid x 2 weeks and then take as needed for pain   Current Outpatient Medications (Other):  .  blood glucose meter kit and supplies, Dispense based on patient and insurance preference. Use up to four times daily as directed. (FOR ICD-9 250.00, 250.01). .  blood glucose meter kit and supplies,  Dispense based on patient and insurance preference. Use up to four times daily as directed. (FOR ICD-10 E10.9, E11.9). Marland Kitchen  Cholecalciferol (VITAMIN D) 50 MCG (2000 UT) tablet, Take 1 tablet (2,000 Units total) by mouth daily. (Patient taking differently: Take 10,000 Units by mouth daily. ) .  finasteride (PROSCAR) 5 MG tablet, Take 1 tablet daily for prostate. .  gabapentin (NEURONTIN) 100 MG capsule, Take 1 to 3  capsules at Bedtime for Sleep & Restless Legs .  glucose blood test strip, Test blood sugars once daily.Dx: E11.9  Allergies: No Known Allergies  Current Problems (verified) has Hyperlipidemia associated with type 2 diabetes mellitus ([place]); CAD (coronary artery disease); Hypertension; OSA and COPD overlap syndrome ([place]); Smoking; Vitamin D deficiency; Medication management; Type 2 diabetes mellitus (HCC); RLS (restless legs syndrome); COPD (chronic obstructive pulmonary disease) (HCC); BPH (benign prostatic hyperplasia); Elevated PSA; Chondromalacia, left knee; Chondromalacia, right knee; Overweight (BMI 25.0-29.9); and Aortic atherosclerosis (HCC) on their problem list.  Screening Tests Immunization History  Administered Date(s) Administered  . Influenza, High Dose Seasonal PF 06/22/2017, 05/15/2019  . Influenza-Unspecified 06/02/2013, 08/18/2014  . Pneumococcal Conjugate-13 11/21/2016  . Pneumococcal Polysaccharide-23 08/27/2007, 01/01/2018  . Td 01/01/2018  . Tdap 08/27/2007  . Zoster 10/20/2013   Tetanus: 2019 Pneumovax: 2009, 2019 Prevnar 13: 2018 Flu vaccine: 05/2019 Zostavax: 2015 Covid 19: 2/2, 2021, pfizer- patient will send information for documenting   DEXA: n/a Colonoscopy: 2013 OVERDUE 5 year follow up- referred back in July 2020, patient never scheduled, states will call insurance and GI office re: cost, phone number given   EGD: -  CT lung: 01/15/2019 - aortic atherosclerosis, emphysema , follow up ordered to schedule in June   Stress test: 11/2016   Names  of Other Physician/Practitioners you currently use: 1. [facility] here for primary care 2. Dr. Katy Fitch, eye doctor, last visit 02/11/2018 with report, overdue patient will schedule  3. Dr. Marland Kitchen dentist, last visit remote, has scheduled June 3   Patient Care Team: [name], MD as PCP - General (Internal Medicine) Josue Hector, MD as Consulting Physician (Cardiology) Ladene Artist, MD as Consulting Physician (Gastroenterology)  Surgical: He  has a past surgical history that includes heart stent (2005); Colonoscopy (11/2011); Polypectomy; Cardiac catheterization (2005); Inguinal hernia repair (Right, 11/16/2014); Insertion of mesh (Right, 11/16/2014); Knee arthroscopy with medial menisectomy (Bilateral, 06/25/2017); and Chondroplasty (06/25/2017). Family His family history includes Cancer in his mother; Hypertension in his father; Kidney cancer in his maternal grandmother; Prostate cancer in his father. Social history  He reports that he has been smoking cigarettes. He started smoking about 51 years ago. He has a 50.00 pack-year smoking history. He has never used smokeless tobacco. He reports current alcohol use of about 28.0 standard drinks of alcohol per week. He reports that he does not use drugs.  MEDICARE WELLNESS OBJECTIVES: Physical activity: Current Exercise Habits: The patient does not participate in regular exercise at present, Exercise limited by: None identified Cardiac risk factors: Cardiac Risk Factors include: advanced age (>2mn, >>54women);hypertension;dyslipidemia;diabetes mellitus;male gender;smoking/ tobacco exposure Depression/mood screen:   [facility] 01/09/2020  Decreased Interest 0  Down, Depressed, Hopeless 0  PHQ - 2 Score 0    ADLs:  In your present state of health, do you have any difficulty performing the following activities: 01/09/2020 01/15/2019  Hearing? N N  Vision? N N  Difficulty concentrating or  making decisions? N N  Walking or climbing stairs? N Y  Dressing or bathing? N N  Doing errands, shopping? N N  Some recent data might be hidden     Cognitive Testing  Alert? Yes  Normal Appearance?Yes  Oriented to person? Yes  Place? Yes   Time? Yes  Recall of three objects?  Yes  Can perform simple calculations? Yes  Displays appropriate judgment?Yes  Can read the correct time from a watch face?Yes  EOL planning: Does Patient  Have a Medical Advance Directive?: Yes Type of Advance Directive: Healthcare Power of Attorney, Living will Does patient want to make changes to medical advance directive?: No - Patient declined Copy of [place] in Chart?: No - copy requested   Objective:   Today's Vitals   01/09/20 1016  BP: 120/70  Pulse: 77  Temp: 98.1 F (36.7 C)  SpO2: 96%  Weight: 197 lb 12.8 oz (89.7 kg)  Height: 5' 10.5" (1.791 m)   Body mass index is 27.98 kg/m.  General appearance: alert, no distress, WD/WN, male HEENT: normocephalic, sclerae anicteric, TMs pearly, nares patent, no discharge or erythema, pharynx normal Oral cavity: MMM, no lesions Neck: supple, no lymphadenopathy, no thyromegaly, no masses Heart: RRR, normal S1, S2, no murmurs Lungs: CTA bilaterally, no wheezes, rhonchi, or rales Abdomen: +bs, soft, non tender, non distended, no masses, no hepatomegaly, no splenomegaly Musculoskeletal: nontender, no swelling, no obvious deformity Extremities: no edema, no cyanosis, no clubbing Pulses: 2+ symmetric, upper and lower extremities, normal cap refill Neurological: alert, oriented x 3, CN2-12 intact, strength normal upper extremities and lower extremities, sensation normal throughout, DTRs 2+ throughout, no cerebellar signs, gait normal Psychiatric: normal affect, behavior normal, pleasant   Medicare Attestation I have personally reviewed: The patient's medical and social history Their use of alcohol, tobacco or illicit drugs Their  current medications and supplements The patient's functional ability including ADLs,fall risks, home safety risks, cognitive, and hearing and visual impairment Diet and physical activities Evidence for depression or mood disorders  The patient's weight, height, BMI, and visual acuity have been recorded in the chart.  I have made referrals, counseling, and provided education to the patient based on review of the above and I have provided the patient with a written personalized care plan for preventive services.     Izora Ribas, NP   01/09/2020

## 2020-01-09 ENCOUNTER — Other Ambulatory Visit: Payer: Self-pay

## 2020-01-09 ENCOUNTER — Encounter: Payer: Self-pay | Admitting: Adult Health

## 2020-01-09 ENCOUNTER — Ambulatory Visit (INDEPENDENT_AMBULATORY_CARE_PROVIDER_SITE_OTHER): Payer: Medicare Other | Admitting: Adult Health

## 2020-01-09 VITALS — BP 120/70 | HR 77 | Temp 98.1°F | Ht 70.5 in | Wt 197.8 lb

## 2020-01-09 DIAGNOSIS — E559 Vitamin D deficiency, unspecified: Secondary | ICD-10-CM

## 2020-01-09 DIAGNOSIS — I10 Essential (primary) hypertension: Secondary | ICD-10-CM | POA: Diagnosis not present

## 2020-01-09 DIAGNOSIS — I25119 Atherosclerotic heart disease of native coronary artery with unspecified angina pectoris: Secondary | ICD-10-CM

## 2020-01-09 DIAGNOSIS — G4733 Obstructive sleep apnea (adult) (pediatric): Secondary | ICD-10-CM

## 2020-01-09 DIAGNOSIS — N401 Enlarged prostate with lower urinary tract symptoms: Secondary | ICD-10-CM

## 2020-01-09 DIAGNOSIS — Z0001 Encounter for general adult medical examination with abnormal findings: Secondary | ICD-10-CM

## 2020-01-09 DIAGNOSIS — I700 Atherosclerosis of aorta: Secondary | ICD-10-CM

## 2020-01-09 DIAGNOSIS — J449 Chronic obstructive pulmonary disease, unspecified: Secondary | ICD-10-CM | POA: Diagnosis not present

## 2020-01-09 DIAGNOSIS — E1159 Type 2 diabetes mellitus with other circulatory complications: Secondary | ICD-10-CM

## 2020-01-09 DIAGNOSIS — Z136 Encounter for screening for cardiovascular disorders: Secondary | ICD-10-CM

## 2020-01-09 DIAGNOSIS — E785 Hyperlipidemia, unspecified: Secondary | ICD-10-CM | POA: Diagnosis not present

## 2020-01-09 DIAGNOSIS — E1169 Type 2 diabetes mellitus with other specified complication: Secondary | ICD-10-CM | POA: Diagnosis not present

## 2020-01-09 DIAGNOSIS — R6889 Other general symptoms and signs: Secondary | ICD-10-CM

## 2020-01-09 DIAGNOSIS — IMO0001 Reserved for inherently not codable concepts without codable children: Secondary | ICD-10-CM

## 2020-01-09 DIAGNOSIS — G2581 Restless legs syndrome: Secondary | ICD-10-CM

## 2020-01-09 DIAGNOSIS — F17200 Nicotine dependence, unspecified, uncomplicated: Secondary | ICD-10-CM

## 2020-01-09 DIAGNOSIS — Z0000 Encounter for general adult medical examination without abnormal findings: Secondary | ICD-10-CM

## 2020-01-09 DIAGNOSIS — E663 Overweight: Secondary | ICD-10-CM

## 2020-01-09 DIAGNOSIS — Z79899 Other long term (current) drug therapy: Secondary | ICD-10-CM

## 2020-01-09 DIAGNOSIS — R972 Elevated prostate specific antigen [PSA]: Secondary | ICD-10-CM

## 2020-01-09 MED ORDER — RAMIPRIL 5 MG PO CAPS: ORAL_CAPSULE | ORAL | 1 refills | Status: DC

## 2020-01-09 MED ORDER — ROSUVASTATIN CALCIUM 20 MG PO TABS: ORAL_TABLET | ORAL | 1 refills | Status: DC

## 2020-01-09 MED ORDER — BREO ELLIPTA 100-25 MCG/INH IN AEPB: INHALATION_SPRAY | RESPIRATORY_TRACT | 5 refills | Status: DC

## 2020-01-09 MED ORDER — GLIMEPIRIDE 4 MG PO TABS: ORAL_TABLET | ORAL | 1 refills | Status: DC

## 2020-01-09 MED ORDER — FINASTERIDE 5 MG PO TABS: ORAL_TABLET | ORAL | 1 refills | Status: DC

## 2020-01-09 NOTE — Patient Instructions (Addendum)
[name] , Thank you for taking time to come for your Medicare Wellness Visit. I appreciate your ongoing commitment to your health goals. Please review the following plan we discussed and let me know if I can assist you in the future.   These are the goals we discussed: Goals    . Exercise 150 minutes per week (moderate activity)    . fasting glucose <130    . HEMOGLOBIN A1C < 7    . LDL CALC < 70    . Reduce alcohol intake     Try to cut back to <2-3 drinks per day        This is a list of the screening recommended for you and due dates:  Health Maintenance  Topic Date Due  . COVID-19 Vaccine (1) Never done  . Colon Cancer Screening  11/13/2016  . Eye exam for diabetics  02/12/2019  . Complete foot exam   03/09/2020  . Flu Shot  03/14/2020  . Hemoglobin A1C  03/21/2020  . Tetanus Vaccine  01/02/2028  .  Hepatitis C: One time screening is recommended by Center for Disease Control  (CDC) for  adults born from [birth year] through 1964.   Completed  . Pneumonia vaccines  Completed    Please ask insurance if they cover shingrix - can get at [facility]  Call insurance and/or Dr. Lynne Leader office regarding colonoscopy [phone]  Please schedule follow up with Dr. Katy Fitch -    Increase to 4 mg glimepiride in AM, then cautiously in PM if still running high Can cause low sugars - monitor closely and don't take if not eating, cut back sugars are running too low    Hypoglycemia Hypoglycemia is when the sugar (glucose) level in your blood is too low. Signs of low blood sugar may include:  Feeling: ? Hungry. ? Worried or nervous (anxious). ? Sweaty and clammy. ? Confused. ? Dizzy. ? Sleepy. ? Sick to your stomach (nauseous).  Having: ? A fast heartbeat. ? A headache. ? A change in your vision. ? Tingling or no feeling (numbness) around your mouth, lips, or tongue. ? Jerky movements that you cannot control (seizure).  Having trouble with: ? Moving  (coordination). ? Sleeping. ? Passing out (fainting). ? Getting upset easily (irritability). Low blood sugar can happen to people who have diabetes and people who do not have diabetes. Low blood sugar can happen quickly, and it can be an emergency. Treating low blood sugar Low blood sugar is often treated by eating or drinking something sugary right away, such as:  Fruit juice, 4-6 oz (120-150 mL).  Regular soda (not diet soda), 4-6 oz (120-150 mL).  Low-fat milk, 4 oz (120 mL).  Several pieces of hard candy.  Sugar or honey, 1 Tbsp (15 mL). Treating low blood sugar if you have diabetes If you can think clearly and swallow safely, follow the 15:15 rule:  Take 15 grams of a fast-acting carb (carbohydrate). Talk with your doctor about how much you should take.  Always keep a source of fast-acting carb with you, such as: ? Sugar tablets (glucose pills). Take 3-4 pills. ? 6-8 pieces of hard candy. ? 4-6 oz (120-150 mL) of fruit juice. ? 4-6 oz (120-150 mL) of regular (not diet) soda. ? 1 Tbsp (15 mL) honey or sugar.  Check your blood sugar 15 minutes after you take the carb.  If your blood sugar is still at or below 70 mg/dL (3.9 mmol/L), take 15 grams of a carb  again.  If your blood sugar does not go above 70 mg/dL (3.9 mmol/L) after 3 tries, get help right away.  After your blood sugar goes back to normal, eat a meal or a snack within 1 hour.  Treating very low blood sugar If your blood sugar is at or below 54 mg/dL (3 mmol/L), you have very low blood sugar (severe hypoglycemia). This may also cause:  Passing out.  Jerky movements you cannot control (seizure).  Losing consciousness (coma). This is an emergency. Do not wait to see if the symptoms will go away. Get medical help right away. Call your local emergency services (911 in the [place] Do not drive yourself to the hospital. If you have very low blood sugar and you cannot eat or drink, you may need a glucagon shot  (injection). A family member or friend should learn how to check your blood sugar and how to give you a glucagon shot. Ask your doctor if you need to have a glucagon shot kit at home. Follow these instructions at home: General instructions  Take over-the-counter and prescription medicines only as told by your doctor.  Stay aware of your blood sugar as told by your doctor.  Limit alcohol intake to no more than 1 drink a day for nonpregnant women and 2 drinks a day for men. One drink equals 12 oz of beer (355 mL), 5 oz of wine (148 mL), or 1 oz of hard liquor (44 mL).  Keep all follow-up visits as told by your doctor. This is important. If you have diabetes:   Follow your diabetes care plan as told by your doctor. Make sure you: ? Know the signs of low blood sugar. ? Take your medicines as told. ? Follow your exercise and meal plan. ? Eat on time. Do not skip meals. ? Check your blood sugar as often as told by your doctor. Always check it before and after exercise. ? Follow your sick day plan when you cannot eat or drink normally. Make this plan ahead of time with your doctor.  Share your diabetes care plan with: ? Your work or school. ? People you live with.  Check your pee (urine) for ketones: ? When you are sick. ? As told by your doctor.  Carry a card or wear jewelry that says you have diabetes. Contact a doctor if:  You have trouble keeping your blood sugar in your target range.  You have low blood sugar often. Get help right away if:  You still have symptoms after you eat or drink something sugary.  Your blood sugar is at or below 54 mg/dL (3 mmol/L).  You have jerky movements that you cannot control.  You pass out. These symptoms may be an emergency. Do not wait to see if the symptoms will go away. Get medical help right away. Call your local emergency services (911 in the [place] Do not drive yourself to the hospital. Summary  Hypoglycemia happens when the level  of sugar (glucose) in your blood is too low.  Low blood sugar can happen to people who have diabetes and people who do not have diabetes. Low blood sugar can happen quickly, and it can be an emergency.  Make sure you know the signs of low blood sugar and know how to treat it.  Always keep a source of sugar (fast-acting carb) with you to treat low blood sugar. This information is not intended to replace advice given to you by your health care provider. Make  sure you discuss any questions you have with your health care provider. Document Revised: 11/21/2018 Document Reviewed: 09/03/2015 Elsevier Patient Education  2020 [place]  [phone] for more information or for a free program for smoking cessation help.   You can call QUIT SMART 1-800-QUIT-NOW for free nicotine patches or replacement therapy- if they are out- keep calling  [facility] Can call for smoking cessation classes, [phone]  If you have a smart phone, please look up Smoke Free app, this will help you stay on track and give you information about money you have saved, life that you have gained back and a ton of more information.   We are giving you chantix for smoking cessation. You can do it! And we are here to help! You may have heard some scary side effects about chantix, the three most common I hear about are nausea, crazy dreams and depression.  However, I like for my patients to try to stay on 1/2 a tablet twice a day rather than one tablet twice a day as normally prescribed. This helps decrease the chances of side effects and helps save money by making a one month prescription last two months  Please start the prescription this way:  Start 1/2 tablet by mouth once daily after food with a full glass of water for 3 days Then do 1/2 tablet by mouth twice daily for 4 days. During this first week you can smoke, but try to stop after  this week.  At this point we have several options: 1) continue on 1/2 tablet twice a day- which I encourage you to do. You can stay on this dose the rest of the time on the medication or if you still feel the need to smoke you can do one of the two options below. 2) do one tablet in the morning and 1/2 in the evening which helps decrease dreams. 3) do one tablet twice a day.   What if I miss a dose? If you miss a dose, take it as soon as you can. If it is almost time for your next dose, take only that dose. Do not take double or extra doses.  What should I watch for while using this medicine? Visit your doctor or health care professional for regular check ups. Ask for ongoing advice and encouragement from your doctor or healthcare professional, friends, and family to help you quit. If you smoke while on this medication, quit again  Your mouth may get dry. Chewing sugarless gum or hard candy, and drinking plenty of water may help. Contact your doctor if the problem does not go away or is severe.  You may get drowsy or dizzy. Do not drive, use machinery, or do anything that needs mental alertness until you know how this medicine affects you. Do not stand or sit up quickly, especially if you are an older patient.   The use of this medicine may increase the chance of suicidal thoughts or actions. Pay special attention to how you are responding while on this medicine. Any worsening of mood, or thoughts of suicide or dying should be reported to your health care professional right away.  ADVANTAGES OF QUITTING SMOKING  Within 20 minutes, blood pressure decreases. Your pulse is at normal level.  After 8 hours, carbon monoxide levels in the blood return to normal. Your oxygen level increases.  After 24 hours, the chance of having  a heart attack starts to decrease. Your breath, hair, and body stop smelling like smoke.  After 48 hours, damaged nerve endings begin to recover. Your sense of taste and  smell improve.  After 72 hours, the body is virtually free of nicotine. Your bronchial tubes relax and breathing becomes easier.  After 2 to 12 weeks, lungs can hold more air. Exercise becomes easier and circulation improves.  After 1 year, the risk of coronary heart disease is cut in half.  After 5 years, the risk of stroke falls to the same as a nonsmoker.  After 10 years, the risk of lung cancer is cut in half and the risk of other cancers decreases significantly.  After 15 years, the risk of coronary heart disease drops, usually to the level of a nonsmoker.  You will have extra money to spend on things other than cigarettes.  [name] , Thank you for taking time to come for your Medicare Wellness Visit. I appreciate your ongoing commitment to your health goals. Please review the following plan we discussed and let me know if I can assist you in the future.   These are the goals we discussed: Goals    . Exercise 150 minutes per week (moderate activity)    . fasting glucose <130    . HEMOGLOBIN A1C < 7    . LDL CALC < 70    . Reduce alcohol intake     Try to cut back to <2-3 drinks per day        This is a list of the screening recommended for you and due dates:  Health Maintenance  Topic Date Due  . COVID-19 Vaccine (1) Never done  . Colon Cancer Screening  11/13/2016  . Eye exam for diabetics  02/12/2019  . Complete foot exam   03/09/2020  . Flu Shot  03/14/2020  . Hemoglobin A1C  03/21/2020  . Tetanus Vaccine  01/02/2028  .  Hepatitis C: One time screening is recommended by Center for Disease Control  (CDC) for  adults born from [birth year] through 1964.   Completed  . Pneumonia vaccines  Completed

## 2020-01-10 ENCOUNTER — Other Ambulatory Visit: Payer: Self-pay | Admitting: Adult Health

## 2020-01-10 LAB — COMPLETE METABOLIC PANEL WITH GFR
AG Ratio: 2 (calc) (ref 1.0–2.5)
ALT: 16 U/L (ref 9–46)
AST: 12 U/L (ref 10–35)
Albumin: 4.3 g/dL (ref 3.6–5.1)
Alkaline phosphatase (APISO): 84 U/L (ref 35–144)
BUN: 13 mg/dL (ref 7–25)
CO2: 24 mmol/L (ref 20–32)
Calcium: 9.3 mg/dL (ref 8.6–10.3)
Chloride: 104 mmol/L (ref 98–110)
Creat: 0.75 mg/dL (ref 0.70–1.25)
GFR, Est African American: 109 mL/min/{1.73_m2} (ref >=60)
GFR, Est Non African American: 94 mL/min/{1.73_m2} (ref >=60)
Globulin: 2.1 g/dL (calc) (ref 1.9–3.7)
Glucose, Bld: 169 mg/dL — ABNORMAL HIGH (ref 65–99)
Potassium: 4.5 mmol/L (ref 3.5–5.3)
Sodium: 138 mmol/L (ref 135–146)
Total Bilirubin: 0.6 mg/dL (ref 0.2–1.2)
Total Protein: 6.4 g/dL (ref 6.1–8.1)

## 2020-01-10 LAB — CBC WITH DIFFERENTIAL/PLATELET
Absolute Monocytes: 623 cells/uL (ref 200–950)
Basophils Absolute: 53 cells/uL (ref 0–200)
Basophils Relative: 0.6 %
Eosinophils Absolute: 392 cells/uL (ref 15–500)
Eosinophils Relative: 4.4 %
HCT: 46.3 % (ref 38.5–50.0)
Hemoglobin: 15.5 g/dL (ref 13.2–17.1)
Lymphs Abs: 2145 cells/uL (ref 850–3900)
MCH: 30.8 pg (ref 27.0–33.0)
MCHC: 33.5 g/dL (ref 32.0–36.0)
MCV: 91.9 fL (ref 80.0–100.0)
MPV: 12.2 fL (ref 7.5–12.5)
Monocytes Relative: 7 %
Neutro Abs: 5687 cells/uL (ref 1500–7800)
Neutrophils Relative %: 63.9 %
Platelets: 165 10*3/uL (ref 140–400)
RBC: 5.04 10*6/uL (ref 4.20–5.80)
RDW: 11.9 % (ref 11.0–15.0)
Total Lymphocyte: 24.1 %
WBC: 8.9 10*3/uL (ref 3.8–10.8)

## 2020-01-10 LAB — LIPID PANEL
Cholesterol: 96 mg/dL (ref <200)
HDL: 39 mg/dL — ABNORMAL LOW (ref >=40)
LDL Cholesterol (Calc): 43 mg/dL (calc)
Non-HDL Cholesterol (Calc): 57 mg/dL (calc) (ref <130)
Total CHOL/HDL Ratio: 2.5 (calc) (ref <5.0)
Triglycerides: 67 mg/dL (ref <150)

## 2020-01-10 LAB — HEMOGLOBIN A1C
Hgb A1c MFr Bld: 7.5 % of total Hgb — ABNORMAL HIGH (ref <5.7)
Mean Plasma Glucose: 169 (calc)
eAG (mmol/L): 9.3 (calc)

## 2020-01-10 LAB — TSH: TSH: 1.29 mIU/L (ref 0.40–4.50)

## 2020-01-10 LAB — MAGNESIUM: Magnesium: 1.7 mg/dL (ref 1.5–2.5)

## 2020-02-02 ENCOUNTER — Ambulatory Visit: Payer: Medicare Other

## 2020-02-11 ENCOUNTER — Other Ambulatory Visit: Payer: Self-pay | Admitting: *Deleted

## 2020-02-11 DIAGNOSIS — E119 Type 2 diabetes mellitus without complications: Secondary | ICD-10-CM

## 2020-02-11 MED ORDER — METFORMIN HCL 1000 MG PO TABS: ORAL_TABLET | ORAL | 0 refills | Status: DC

## 2020-02-13 ENCOUNTER — Ambulatory Visit: Payer: Medicare Other

## 2020-02-17 ENCOUNTER — Ambulatory Visit
Admission: RE | Admit: 2020-02-17 | Discharge: 2020-02-17 | Disposition: A | Discharge status: Home / Self Care | Payer: Medicare Other | Source: Ambulatory Visit | Attending: Adult Health | Admitting: Adult Health

## 2020-02-17 ENCOUNTER — Encounter: Payer: Self-pay | Admitting: Adult Health

## 2020-02-17 DIAGNOSIS — K760 Fatty (change of) liver, not elsewhere classified: Secondary | ICD-10-CM

## 2020-02-17 DIAGNOSIS — F17200 Nicotine dependence, unspecified, uncomplicated: Secondary | ICD-10-CM

## 2020-02-17 DIAGNOSIS — Z87891 Personal history of nicotine dependence: Secondary | ICD-10-CM | POA: Diagnosis not present

## 2020-02-17 HISTORY — DX: Fatty (change of) liver, not elsewhere classified: K76.0

## 2020-02-23 ENCOUNTER — Other Ambulatory Visit: Payer: Self-pay | Admitting: Internal Medicine

## 2020-02-23 DIAGNOSIS — E1159 Type 2 diabetes mellitus with other circulatory complications: Secondary | ICD-10-CM

## 2020-03-15 ENCOUNTER — Other Ambulatory Visit: Payer: Self-pay | Admitting: Adult Health

## 2020-03-16 ENCOUNTER — Encounter: Payer: BC Managed Care – PPO | Admitting: Adult Health

## 2020-03-25 ENCOUNTER — Other Ambulatory Visit: Payer: Self-pay | Admitting: Internal Medicine

## 2020-03-25 ENCOUNTER — Other Ambulatory Visit: Payer: Self-pay | Admitting: *Deleted

## 2020-03-25 DIAGNOSIS — N320 Bladder-neck obstruction: Secondary | ICD-10-CM

## 2020-03-25 MED ORDER — DOXAZOSIN MESYLATE 8 MG PO TABS: ORAL_TABLET | ORAL | 0 refills | Status: DC

## 2020-03-31 ENCOUNTER — Encounter: Payer: Medicare Other | Admitting: Adult Health

## 2020-04-12 DIAGNOSIS — J069 Acute upper respiratory infection, unspecified: Secondary | ICD-10-CM | POA: Diagnosis not present

## 2020-04-22 DIAGNOSIS — E538 Deficiency of other specified B group vitamins: Secondary | ICD-10-CM

## 2020-04-22 HISTORY — DX: Deficiency of other specified B group vitamins: E53.8

## 2020-04-22 NOTE — Progress Notes (Signed)
Complete Physical  Assessment and Plan:   Encounter for general adult medical examination with abnormal findings  Essential hypertension Continue medications Monitor blood pressure at home; call if consistently over 130/80 Continue DASH diet.   Reminder to go to the ER if any CP, SOB, nausea, dizziness, severe HA, changes vision/speech, left arm numbness and tingling and jaw pain.  Coronary artery disease involving native heart  Control blood pressure, cholesterol, glucose, increase exercise.  Denies angina; normal myoview 07/2019 per Dr. Johnsie Cancel Continue cardio follow up Stop smoking  OSA and COPD overlap syndrome ([place]) Wears mouthpiece, followed by Dr. Toy Cookey Will retry mask due to lack of perceived benefit with oral device; states he will reach out to DME  Chronic obstructive pulmonary disease, unspecified COPD type ([place]) Continue inhalers, stop smoking, has recent CT lung  Type 2 diabetes mellitus with other circulatory complication, without long-term current use of insulin [facility]) Education: Reviewed 'ABCs' of diabetes management (respective goals in parentheses):  A1C (<7), blood pressure (<130/80), and cholesterol (LDL <70) Eye Exam yearly and Dental Exam every 6 months - reminded due for annual diabetic eye exam - he will schedule with Dr. Katy Fitch Clarify whether glimepiride is 2 mg or 4 mg - plan to transition to 4 mg AM, then 2-4 mg PM Dietary recommendations Physical Activity recommendations Foot exam completed - normal However diminished post tib pulses - smoker - never had ABI - ordered today   Benign prostatic hyperplasia with urinary frequency Doxazosin and finasteride Followed by urology   Vitamin D deficiency Continue supplementation Check vitamin D level  Smoking Discussed risks associated with tobacco use and advised to reduce or quit Patient is ready to do so and plans to taper States has chantix at home that he plans to start - instructions given  Will  follow up at the next visit Strategies for success discussed.  Had chest CT 02/2020  RLS (restless legs syndrome) , increase daytime activity, check iron, try increase gabapentin - 200 mg at HS  Mixed hyperlipidemia Continue medications Continue low cholesterol diet and exercise.  Check lipid panel.   Medication management CBC, CMP/GFR, UA, magnesium  Elevated PSA Check PSA Followed by urology   Overweight (BMI 25.0-29.9) Long discussion about weight loss, diet, and exercise Recommended diet heavy in fruits and veggies and low in animal meats, cheeses, and dairy products, appropriate calorie intake Discussed appropriate weight for height  Follow up at next visit  Aortic atherosclerosis ([place]) Control blood pressure, cholesterol, glucose, increase exercise.   B12 deficiency  Recheck levels; never started supplement; recommend subligual supplement to maintain levels 500+   Diminished pulses/screening vascular disease Smoker, T2DM, never ABI, screening ordered  Orders Placed This Encounter  Procedures  . CBC with Differential/Platelet  . COMPLETE METABOLIC PANEL WITH GFR  . Magnesium  . Lipid panel  . TSH  . Hemoglobin A1c  . VITAMIN D 25 Hydroxy (Vit-D Deficiency, Fractures)  . PSA  . Microalbumin / creatinine urine ratio  . Urinalysis, Routine w reflex microscopic  . Vitamin B12  . Ambulatory referral to Gastroenterology  . EKG 12-Lead  . HM DIABETES FOOT EXAM  . VAS [place] ABI WITH/WO TBI     Discussed med's effects and SE's. Screening labs and tests as requested with regular follow-up as recommended. Over 40 minutes of exam, counseling, chart review and critical decision making was performed  Future Appointments  Date Time Provider [place]  07/28/2020 11:15 AM [name], MD [facility] None  10/28/2020  9:30  AM [name], NP [facility] None  01/28/2021 10:30 AM [name], NP [facility] None  05/02/2021  9:00 AM [name], NP  [facility] None     HPI 69 y.o. male patient presents for a complete physical. He has Hyperlipidemia associated with type 2 diabetes mellitus ([place]); CAD (coronary artery disease); Hypertension; OSA and COPD overlap syndrome ([place]); Smoking; Vitamin D deficiency; Medication management; Type 2 diabetes mellitus (H[facility]); RLS (restless legs syndrome); COPD (chronic obstructive pulmonary disease) ([facility]); BPH (benign prostatic hyperplasia); Elevated PSA; Chondromalacia, left knee; Chondromalacia, right knee; Overweight (BMI 25.0-29.9); Aortic atherosclerosis ([place]); Hepatic steatosis; B12 deficiency; History of colonic polyps; and Excessive drinking alcohol on their problem list.  He is married with 3 kids, 1 is his. 53 Grandchildren. Just retired recently in 2021 from [employer], job of 33 years. Wants to set up a gym at home and exercise more. Spending lots of time at the beach.    No concerns.   Follows with Dr. Erlinda Hong for ortho concerns - had shoulder injection and doing well, knees do bother him some, takes voltaren PRN.   he is a smoker, cutting down, but admits smoking average 1/2 pack; estimated 50 pack year history; has chantix at home and plans to start this sometime soon.  He had Ct lung in 02/2020 which did not show any concerning nodules.  He does have COPD per imaging and is on Breo which works well for him.   OSA, intolerant of CPAP and had been using mouth piece but he feels didn't work well; managed by Dr. Toy Cookey, has been recommended repeat sleep study but patient adamantly declines repeat. He plans to try again with mask, still has CPAP.    He has RLS, had been declining meds but having some trouble sleeping, gabapentin does help, but taking 100 mg only, will try 200 mg daily.   BMI is Body mass index is 27.44 kg/m., he has been working on diet and admits less active since retiring, but "not sitting." staying busy around the home.  He is 188 lb at home, at goal  previously set for 190 lb and happy with current weight.  Admits to 2-4 beers nightly, 1 cup coffee daily  Generally watching diet  Wt Readings from Last 3 Encounters:  04/23/20 194 lb (88 kg)  01/09/20 197 lb 12.8 oz (89.7 kg)  09/22/19 192 lb (87.1 kg)   He has a history of CAD s/p PCI to RCA in2005. He underwent LHC in2006 and 2012 which showed a proximal LAD 60% (unchanged from previous catheterization), Medical therapy recommended. Has done well since cath in 2012 and followed by Dr. Kyla Balzarine office, had normal follow up myoview in 07/2019.   His blood pressure has been controlled at home, today their BP is BP: 120/68 He does not workout.Marland Kitchen He denies chest pain, shortness of breath, dizziness.   He is on cholesterol medication (rosuvastastin 20 mg three days a week) and denies myalgias. His cholesterol is at goal. The cholesterol last visit was:   Lab Results  Component Value Date   CHOL 96 01/09/2020   HDL 39 (L) 01/09/2020   LDLCALC 43 01/09/2020   TRIG 67 01/09/2020   CHOLHDL 2.5 01/09/2020   He has been working on diet and exercise for diabetes (on metformin 1000 mg BID, glimepiride BID, unsure of dose 2 mg vs 4 mg), he is on bASA, he is on ACE/ARB and denies foot ulcerations, increased appetite, nausea, paresthesia of the feet, polydipsia, polyuria, visual  disturbances, vomiting and weight loss.  ABI - never He does check fasting sugars ranging 160-180 Last A1C in the office was:  Lab Results  Component Value Date   HGBA1C 7.5 (H) 01/09/2020   Last GFR: Lab Results  Component Value Date   [facility] 94 01/09/2020    Patient is on Vitamin D supplement, 10000 IU daily    Lab Results  Component Value Date   VD25OH 84 03/10/2019     Has hx of elevated PSAs, BPH with LUTS (nocturia x 2 night) and is followed by [facility]. He is on doxazosin and finasteride.  Lab Results  Component Value Date   PSA 3.3 03/10/2019   PSA 3.8 01/01/2018   PSA 2.9 11/21/2016    B12 was low, admits didn't start supplement Lab Results  Component Value Date   VITAMINB12 274 03/10/2019     Current Medications:  Current Outpatient Medications on File Prior to Visit  Medication Sig Dispense Refill  . acetaminophen (TYLENOL) 500 MG tablet Take 2 tablets (1,000 mg total) by mouth every 6 (six) hours. (Patient taking differently: Take 1,000 mg by mouth as needed. ) 30 tablet 0  . aspirin EC 81 MG tablet Take 1 tablet (81 mg total) by mouth daily. 90 tablet 3  . blood glucose meter kit and supplies Dispense based on patient and insurance preference. Use up to four times daily as directed. (FOR ICD-9 250.00, 250.01). 1 each 0  . blood glucose meter kit and supplies Dispense based on patient and insurance preference. Use up to four times daily as directed. (FOR ICD-10 E10.9, E11.9). 1 each 0  . cetirizine (ZYRTEC) 10 MG tablet Take 10 mg by mouth as needed for allergies.     . Cholecalciferol (VITAMIN D) 50 MCG (2000 UT) tablet Take 1 tablet (2,000 Units total) by mouth daily. (Patient taking differently: Take 10,000 Units by mouth daily. )    . diclofenac (VOLTAREN) 75 MG EC tablet Take one tab po bid x 2 weeks and then take as needed for pain 60 tablet 1  . doxazosin (CARDURA) 8 MG tablet Take 1 tablet   at Bedtime    for BP & Prostate 90 tablet 0  . fexofenadine (ALLEGRA) 60 MG tablet Take 60 mg by mouth daily as needed for allergies or rhinitis.    . finasteride (PROSCAR) 5 MG tablet Take 1 tablet daily for prostate. 90 tablet 1  . fluticasone furoate-vilanterol (BREO ELLIPTA) 100-25 MCG/INH AEPB INHALE 1 PUFF BY MOUTH ONCE DAILY. RINSE MOUTH WITH WATER AFTER EACH USE. 60 each 5  . gabapentin (NEURONTIN) 100 MG capsule Take 1 to 3 capsules at Bedtime for Sleep & Restless Legs 270 capsule 0  . glimepiride (AMARYL) 2 MG tablet TAKE 1 TABLET BY MOUTH TWICE DAILY WITH MEALS FOR DIABETES 180 tablet 0  . glimepiride (AMARYL) 4 MG tablet Take 1/2- 1 tablet 2 x /day with Meals  for Diabetes 180 tablet 1  . glucose blood test strip Test blood sugars once daily.Dx: E11.9 100 each 2  . metFORMIN (GLUCOPHAGE) 1000 MG tablet Take 1 tablet 2 x /day with Meals for Diabetes 180 tablet 0  . ramipril (ALTACE) 5 MG capsule Take 1 capsule Daily 90 capsule 1  . rosuvastatin (CRESTOR) 20 MG tablet Take 1 tab three nights a week for cholesterol. 90 tablet 1   No current facility-administered medications on file prior to visit.   Allergies:  No Known Allergies Health Maintenance:  Immunization History  Administered Date(s) Administered  . Influenza, High Dose Seasonal PF 06/22/2017, 05/15/2019  . Influenza-Unspecified 06/02/2013, 08/18/2014  . Pneumococcal Conjugate-13 11/21/2016  . Pneumococcal Polysaccharide-23 08/27/2007, 01/01/2018  . Td 01/01/2018  . Tdap 08/27/2007  . Zoster 10/20/2013   Tetanus: 2019 Pneumovax: 2009, 2019 Prevnar 13: 2018 Flu vaccine: 05/2019 Zostavax: 2015 Covid 19: 2/2, 2021, pfizer- patient will send information for documenting    DEXA: n/a Colonoscopy: 2013 OVERDUE 5 year follow up- referred back in July 2020, patient never scheduled, states will call insurance and GI office re: cost, discussed and referred back again today. If won't cover consider fecal globin immunochemistry   EGD: -  CT lung:  02/2020 - aortic atherosclerosis, emphysema , follow up ordered to schedule in June   Stress test: 07/2019  Names of Other Physician/Practitioners you currently use: 1. [facility] here for primary care 2. Dr. Katy Fitch, eye doctor, last visit 02/11/2018 with report, overdue patient states will schedule  3. Dr. ?, dentist, last visit remote, had scheduled but needs to find new dentist due to insurance  4. Dr. ?, derm, last few years ago, plans to schedule follow up this year   Patient Care Team: [name], MD as PCP - General (Internal Medicine) Josue Hector, MD as Consulting Physician  (Cardiology) Ladene Artist, MD as Consulting Physician (Gastroenterology)  Medical History:  has Hyperlipidemia associated with type 2 diabetes mellitus [facility]); CAD (coronary artery disease); Hypertension; OSA and COPD overlap syndrome ([place]); Smoking; Vitamin D deficiency; Medication management; Type 2 diabetes mellitus ([facility]); RLS (restless legs syndrome); COPD (chronic obstructive pulmonary disease) ([facility]); BPH (benign prostatic hyperplasia); Elevated PSA; Chondromalacia, left knee; Chondromalacia, right knee; Overweight (BMI 25.0-29.9); Aortic atherosclerosis ([place]); Hepatic steatosis; B12 deficiency; History of colonic polyps; and Excessive drinking alcohol on their problem list. Surgical History:  He  has a past surgical history that includes heart stent (2005); Colonoscopy (11/2011); Polypectomy; Cardiac catheterization (2005); Inguinal hernia repair (Right, 11/16/2014); Insertion of mesh (Right, 11/16/2014); Knee arthroscopy with medial menisectomy (Bilateral, 06/25/2017); and Chondroplasty (06/25/2017). Family History:  His family history includes Cancer in his mother; Hypertension in his father; Kidney cancer in his maternal grandmother; Prostate cancer in his father. Social History:   reports that he has been smoking cigarettes. He started smoking about 51 years ago. He has a 51.00 pack-year smoking history. He has never used smokeless tobacco. He reports current alcohol use of about 28.0 standard drinks of alcohol per week. He reports that he does not use drugs. Review of Systems:  Review of Systems  Constitutional: Negative for malaise/fatigue and weight loss.  HENT: Negative for hearing loss and tinnitus.   Eyes: Negative for blurred vision and double vision.  Respiratory: Negative for cough, sputum production, shortness of breath and wheezing.   Cardiovascular: Negative for chest pain, palpitations, orthopnea, claudication, leg swelling and PND.  Gastrointestinal: Negative for abdominal  pain, blood in stool, constipation, diarrhea, heartburn, melena, nausea and vomiting.  Genitourinary: Negative.        Nocturia x 2  Musculoskeletal: Negative for falls, joint pain and myalgias.  Skin: Negative for rash.  Neurological: Negative for dizziness, tingling, sensory change, weakness and headaches.  Endo/Heme/Allergies: Positive for environmental allergies. Negative for polydipsia.  Psychiatric/Behavioral: Negative for depression, memory loss, substance abuse and suicidal ideas. The patient has insomnia (intermittent with RLS symptoms). The patient is not nervous/anxious.   All other systems reviewed and are negative.   Physical Exam: Estimated body mass index is 27.44  kg/m as calculated from the following:   Height as of this encounter: 5' 10.5" (1.791 m).   Weight as of this encounter: 194 lb (88 kg). BP 120/68   Pulse 68   Temp 97.6 F (36.4 C)   Ht 5' 10.5" (1.791 m)   Wt 194 lb (88 kg)   SpO2 99%   BMI 27.44 kg/m  General Appearance: Well nourished, in no apparent distress.  Eyes: PERRLA, EOMs, conjunctiva no swelling or erythema, normal fundi and vessels.  Sinuses: No Frontal/maxillary tenderness  ENT/Mouth: Ext aud canals clear, normal light reflex with TMs without erythema, bulging. Good dentition. No erythema, swelling, or exudate on post pharynx. Tonsils not swollen or erythematous. Hearing normal.  Neck: Supple, thyroid normal. No bruits  Respiratory: Respiratory effort normal, BS equal bilaterally without rales, rhonchi, wheezing or stridor.  Cardio: RRR without murmurs, rubs or gallops. Intact brisk pedal pulses, post tibial pulses are faint,  without edema.  Chest: symmetric, with normal excursions and percussion.  Abdomen: Soft, nontender, no guarding, rebound, masses, or organomegaly. Mild ventral hernia with bearing down.  Lymphatics: Non tender without lymphadenopathy.  Genitourinary: No concerns, declines; urology following Musculoskeletal: Full ROM  all peripheral extremities,5/5 strength, and normal gait.  Skin: Warm, dry without rashes, lesions, ecchymosis. Neuro: Cranial nerves intact, reflexes equal bilaterally. Normal muscle tone, no cerebellar symptoms. Sensation intact.  Psych: Awake and oriented X 3, normal affect, Insight and Judgment appropriate.   EKG: RBBB - no significant changes from previous   [place] 12:00 PM [facility]

## 2020-04-23 ENCOUNTER — Ambulatory Visit (INDEPENDENT_AMBULATORY_CARE_PROVIDER_SITE_OTHER): Payer: Medicare Other | Admitting: Adult Health

## 2020-04-23 ENCOUNTER — Other Ambulatory Visit: Payer: Self-pay

## 2020-04-23 ENCOUNTER — Encounter: Payer: Self-pay | Admitting: Adult Health

## 2020-04-23 VITALS — BP 120/68 | HR 68 | Temp 97.6°F | Ht 70.5 in | Wt 194.0 lb

## 2020-04-23 DIAGNOSIS — Z0000 Encounter for general adult medical examination without abnormal findings: Secondary | ICD-10-CM | POA: Diagnosis not present

## 2020-04-23 DIAGNOSIS — E785 Hyperlipidemia, unspecified: Secondary | ICD-10-CM

## 2020-04-23 DIAGNOSIS — F1010 Alcohol abuse, uncomplicated: Secondary | ICD-10-CM | POA: Insufficient documentation

## 2020-04-23 DIAGNOSIS — J449 Chronic obstructive pulmonary disease, unspecified: Secondary | ICD-10-CM

## 2020-04-23 DIAGNOSIS — N401 Enlarged prostate with lower urinary tract symptoms: Secondary | ICD-10-CM

## 2020-04-23 DIAGNOSIS — E559 Vitamin D deficiency, unspecified: Secondary | ICD-10-CM

## 2020-04-23 DIAGNOSIS — I2510 Atherosclerotic heart disease of native coronary artery without angina pectoris: Secondary | ICD-10-CM

## 2020-04-23 DIAGNOSIS — Z136 Encounter for screening for cardiovascular disorders: Secondary | ICD-10-CM | POA: Diagnosis not present

## 2020-04-23 DIAGNOSIS — Z860100 Personal history of colon polyps, unspecified: Secondary | ICD-10-CM

## 2020-04-23 DIAGNOSIS — Z125 Encounter for screening for malignant neoplasm of prostate: Secondary | ICD-10-CM

## 2020-04-23 DIAGNOSIS — E1169 Type 2 diabetes mellitus with other specified complication: Secondary | ICD-10-CM

## 2020-04-23 DIAGNOSIS — K760 Fatty (change of) liver, not elsewhere classified: Secondary | ICD-10-CM

## 2020-04-23 DIAGNOSIS — G4733 Obstructive sleep apnea (adult) (pediatric): Secondary | ICD-10-CM

## 2020-04-23 DIAGNOSIS — R972 Elevated prostate specific antigen [PSA]: Secondary | ICD-10-CM

## 2020-04-23 DIAGNOSIS — E1159 Type 2 diabetes mellitus with other circulatory complications: Secondary | ICD-10-CM

## 2020-04-23 DIAGNOSIS — F17200 Nicotine dependence, unspecified, uncomplicated: Secondary | ICD-10-CM

## 2020-04-23 DIAGNOSIS — E538 Deficiency of other specified B group vitamins: Secondary | ICD-10-CM

## 2020-04-23 DIAGNOSIS — Z79899 Other long term (current) drug therapy: Secondary | ICD-10-CM | POA: Diagnosis not present

## 2020-04-23 DIAGNOSIS — I10 Essential (primary) hypertension: Secondary | ICD-10-CM

## 2020-04-23 DIAGNOSIS — G2581 Restless legs syndrome: Secondary | ICD-10-CM

## 2020-04-23 DIAGNOSIS — R0989 Other specified symptoms and signs involving the circulatory and respiratory systems: Secondary | ICD-10-CM

## 2020-04-23 DIAGNOSIS — R350 Frequency of micturition: Secondary | ICD-10-CM

## 2020-04-23 DIAGNOSIS — E663 Overweight: Secondary | ICD-10-CM

## 2020-04-23 DIAGNOSIS — I700 Atherosclerosis of aorta: Secondary | ICD-10-CM

## 2020-04-23 HISTORY — DX: Personal history of colon polyps, unspecified: Z86.0100

## 2020-04-23 MED ORDER — BENZONATATE 200 MG PO CAPS: ORAL_CAPSULE | ORAL | 1 refills | Status: DC

## 2020-04-23 NOTE — Patient Instructions (Addendum)
[name] , Thank you for taking time to come for your Medicare Wellness Visit. I appreciate your ongoing commitment to your health goals. Please review the following plan we discussed and let me know if I can assist you in the future.   These are the goals we discussed: Goals    . Exercise 150 minutes per week (moderate activity)    . fasting glucose <130    . HEMOGLOBIN A1C < 7    . LDL CALC < 70    . Quit smoking / using tobacco    . Reduce alcohol intake     Try to cut back to <2 drinks per day, avoid drinking daily     . Weight (lb) < 190 lb (86.2 kg)       This is a list of the screening recommended for you and due dates:  Health Maintenance  Topic Date Due  . COVID-19 Vaccine (1) Never done  . Colon Cancer Screening  11/13/2016  . Eye exam for diabetics  02/12/2019  . Flu Shot  03/14/2020  . Hemoglobin A1C  07/11/2020  . Complete foot exam   04/23/2021  . Tetanus Vaccine  01/02/2028  .  Hepatitis C: One time screening is recommended by Center for Disease Control  (CDC) for  adults born from [birth year] through 1964.   Completed  . Pneumonia vaccines  Completed    Please call back with covid 19 vaccine information   Please schedule with Dr. Katy Fitch  Let me know glipizide correct dose that you are currently doing     [place]  [phone] for more information or for a free program for smoking cessation help.   You can call QUIT SMART 1-800-QUIT-NOW for free nicotine patches or replacement therapy- if they are out- keep calling  [facility] Can call for smoking cessation classes, [phone]  If you have a smart phone, please look up Smoke Free app, this will help you stay on track and give you information about money you have saved, life that you have gained back and a ton of more information.   We are giving you chantix for smoking cessation. You can do it! And we are here to help! You may have heard  some scary side effects about chantix, the three most common I hear about are nausea, crazy dreams and depression.  However, I like for my patients to try to stay on 1/2 a tablet twice a day rather than one tablet twice a day as normally prescribed. This helps decrease the chances of side effects and helps save money by making a one month prescription last two months  Please start the prescription this way:  Start 1/2 tablet by mouth once daily after food with a full glass of water for 3 days Then do 1/2 tablet by mouth twice daily for 4 days. During this first week you can smoke, but try to stop after this week.  At this point we have several options: 1) continue on 1/2 tablet twice a day- which I encourage you to do. You can stay on this dose the rest of the time on the medication or if you still feel the need to smoke you can do one of the two options below. 2) do one tablet in the morning and 1/2 in the evening which helps decrease dreams. 3) do one tablet twice a day.   What if I miss a dose? If you miss  a dose, take it as soon as you can. If it is almost time for your next dose, take only that dose. Do not take double or extra doses.  What should I watch for while using this medicine? Visit your doctor or health care professional for regular check ups. Ask for ongoing advice and encouragement from your doctor or healthcare professional, friends, and family to help you quit. If you smoke while on this medication, quit again  Your mouth may get dry. Chewing sugarless gum or hard candy, and drinking plenty of water may help. Contact your doctor if the problem does not go away or is severe.  You may get drowsy or dizzy. Do not drive, use machinery, or do anything that needs mental alertness until you know how this medicine affects you. Do not stand or sit up quickly, especially if you are an older patient.   The use of this medicine may increase the chance of suicidal thoughts or actions. Pay  special attention to how you are responding while on this medicine. Any worsening of mood, or thoughts of suicide or dying should be reported to your health care professional right away.  ADVANTAGES OF QUITTING SMOKING  Within 20 minutes, blood pressure decreases. Your pulse is at normal level.  After 8 hours, carbon monoxide levels in the blood return to normal. Your oxygen level increases.  After 24 hours, the chance of having a heart attack starts to decrease. Your breath, hair, and body stop smelling like smoke.  After 48 hours, damaged nerve endings begin to recover. Your sense of taste and smell improve.  After 72 hours, the body is virtually free of nicotine. Your bronchial tubes relax and breathing becomes easier.  After 2 to 12 weeks, lungs can hold more air. Exercise becomes easier and circulation improves.  After 1 year, the risk of coronary heart disease is cut in half.  After 5 years, the risk of stroke falls to the same as a nonsmoker.  After 10 years, the risk of lung cancer is cut in half and the risk of other cancers decreases significantly.  After 15 years, the risk of coronary heart disease drops, usually to the level of a nonsmoker.  You will have extra money to spend on things other than cigarettes.

## 2020-04-24 LAB — CBC WITH DIFFERENTIAL/PLATELET
Absolute Monocytes: 731 cells/uL (ref 200–950)
Basophils Absolute: 44 cells/uL (ref 0–200)
Basophils Relative: 0.5 %
Eosinophils Absolute: 357 cells/uL (ref 15–500)
Eosinophils Relative: 4.1 %
HCT: 48.5 % (ref 38.5–50.0)
Hemoglobin: 16.4 g/dL (ref 13.2–17.1)
Lymphs Abs: 1844 cells/uL (ref 850–3900)
MCH: 31.4 pg (ref 27.0–33.0)
MCHC: 33.8 g/dL (ref 32.0–36.0)
MCV: 92.7 fL (ref 80.0–100.0)
MPV: 12.2 fL (ref 7.5–12.5)
Monocytes Relative: 8.4 %
Neutro Abs: 5725 cells/uL (ref 1500–7800)
Neutrophils Relative %: 65.8 %
Platelets: 172 10*3/uL (ref 140–400)
RBC: 5.23 10*6/uL (ref 4.20–5.80)
RDW: 12 % (ref 11.0–15.0)
Total Lymphocyte: 21.2 %
WBC: 8.7 10*3/uL (ref 3.8–10.8)

## 2020-04-24 LAB — LIPID PANEL
Cholesterol: 125 mg/dL (ref <200)
HDL: 43 mg/dL (ref >=40)
LDL Cholesterol (Calc): 66 mg/dL (calc)
Non-HDL Cholesterol (Calc): 82 mg/dL (calc) (ref <130)
Total CHOL/HDL Ratio: 2.9 (calc) (ref <5.0)
Triglycerides: 77 mg/dL (ref <150)

## 2020-04-24 LAB — COMPLETE METABOLIC PANEL WITH GFR
AG Ratio: 1.9 (calc) (ref 1.0–2.5)
ALT: 18 U/L (ref 9–46)
AST: 13 U/L (ref 10–35)
Albumin: 4.2 g/dL (ref 3.6–5.1)
Alkaline phosphatase (APISO): 96 U/L (ref 35–144)
BUN/Creatinine Ratio: 13 (calc) (ref 6–22)
BUN: 9 mg/dL (ref 7–25)
CO2: 27 mmol/L (ref 20–32)
Calcium: 9.2 mg/dL (ref 8.6–10.3)
Chloride: 103 mmol/L (ref 98–110)
Creat: 0.69 mg/dL — ABNORMAL LOW (ref 0.70–1.25)
GFR, Est African American: 113 mL/min/{1.73_m2} (ref >=60)
GFR, Est Non African American: 98 mL/min/{1.73_m2} (ref >=60)
Globulin: 2.2 g/dL (calc) (ref 1.9–3.7)
Glucose, Bld: 199 mg/dL — ABNORMAL HIGH (ref 65–99)
Potassium: 4.2 mmol/L (ref 3.5–5.3)
Sodium: 137 mmol/L (ref 135–146)
Total Bilirubin: 0.6 mg/dL (ref 0.2–1.2)
Total Protein: 6.4 g/dL (ref 6.1–8.1)

## 2020-04-24 LAB — URINALYSIS, ROUTINE W REFLEX MICROSCOPIC
Bilirubin Urine: NEGATIVE
Hgb urine dipstick: NEGATIVE
Ketones, ur: NEGATIVE
Leukocytes,Ua: NEGATIVE
Nitrite: NEGATIVE
Protein, ur: NEGATIVE
Specific Gravity, Urine: 1.018 (ref 1.001–1.03)
pH: 5.5 (ref 5.0–8.0)

## 2020-04-24 LAB — MICROALBUMIN / CREATININE URINE RATIO
Creatinine, Urine: 119 mg/dL (ref 20–320)
Microalb Creat Ratio: 13 mcg/mg creat (ref <30)
Microalb, Ur: 1.6 mg/dL

## 2020-04-24 LAB — HEMOGLOBIN A1C
Hgb A1c MFr Bld: 7.2 % of total Hgb — ABNORMAL HIGH (ref <5.7)
Mean Plasma Glucose: 160 (calc)
eAG (mmol/L): 8.9 (calc)

## 2020-04-24 LAB — PSA: PSA: 2.4 ng/mL (ref <=4.0)

## 2020-04-24 LAB — TSH: TSH: 1.21 mIU/L (ref 0.40–4.50)

## 2020-04-24 LAB — MAGNESIUM: Magnesium: 1.7 mg/dL (ref 1.5–2.5)

## 2020-04-24 LAB — VITAMIN B12: Vitamin B-12: 324 pg/mL (ref 200–1100)

## 2020-04-24 LAB — VITAMIN D 25 HYDROXY (VIT D DEFICIENCY, FRACTURES): Vit D, 25-Hydroxy: 91 ng/mL (ref 30–100)

## 2020-05-05 ENCOUNTER — Other Ambulatory Visit: Payer: Self-pay

## 2020-05-18 ENCOUNTER — Ambulatory Visit (HOSPITAL_COMMUNITY)
Admission: RE | Admit: 2020-05-18 | Discharge: 2020-05-18 | Disposition: A | Discharge status: Home / Self Care | Payer: Medicare Other | Source: Ambulatory Visit | Attending: Cardiovascular Disease | Admitting: Cardiovascular Disease

## 2020-05-18 ENCOUNTER — Other Ambulatory Visit: Payer: Self-pay

## 2020-05-18 DIAGNOSIS — Z136 Encounter for screening for cardiovascular disorders: Secondary | ICD-10-CM

## 2020-05-18 DIAGNOSIS — F17200 Nicotine dependence, unspecified, uncomplicated: Secondary | ICD-10-CM | POA: Diagnosis not present

## 2020-05-18 DIAGNOSIS — IMO0001 Reserved for inherently not codable concepts without codable children: Secondary | ICD-10-CM

## 2020-05-18 DIAGNOSIS — R0989 Other specified symptoms and signs involving the circulatory and respiratory systems: Secondary | ICD-10-CM

## 2020-05-18 DIAGNOSIS — E1159 Type 2 diabetes mellitus with other circulatory complications: Secondary | ICD-10-CM | POA: Diagnosis not present

## 2020-05-27 ENCOUNTER — Other Ambulatory Visit: Payer: Self-pay

## 2020-05-27 DIAGNOSIS — E1159 Type 2 diabetes mellitus with other circulatory complications: Secondary | ICD-10-CM

## 2020-05-27 MED ORDER — GLIMEPIRIDE 2 MG PO TABS: ORAL_TABLET | ORAL | 0 refills | Status: DC

## 2020-07-12 DIAGNOSIS — R81 Glycosuria: Secondary | ICD-10-CM | POA: Diagnosis not present

## 2020-07-28 ENCOUNTER — Other Ambulatory Visit: Payer: Self-pay

## 2020-07-28 ENCOUNTER — Ambulatory Visit (INDEPENDENT_AMBULATORY_CARE_PROVIDER_SITE_OTHER): Payer: Medicare Other | Admitting: Internal Medicine

## 2020-07-28 VITALS — BP 130/66 | HR 89 | Temp 97.0°F | Resp 16 | Ht 70.5 in | Wt 197.2 lb

## 2020-07-28 DIAGNOSIS — I700 Atherosclerosis of aorta: Secondary | ICD-10-CM

## 2020-07-28 DIAGNOSIS — E1122 Type 2 diabetes mellitus with diabetic chronic kidney disease: Secondary | ICD-10-CM | POA: Diagnosis not present

## 2020-07-28 DIAGNOSIS — Z23 Encounter for immunization: Secondary | ICD-10-CM | POA: Diagnosis not present

## 2020-07-28 DIAGNOSIS — E559 Vitamin D deficiency, unspecified: Secondary | ICD-10-CM | POA: Diagnosis not present

## 2020-07-28 DIAGNOSIS — N181 Chronic kidney disease, stage 1: Secondary | ICD-10-CM

## 2020-07-28 DIAGNOSIS — E785 Hyperlipidemia, unspecified: Secondary | ICD-10-CM | POA: Diagnosis not present

## 2020-07-28 DIAGNOSIS — I10 Essential (primary) hypertension: Secondary | ICD-10-CM | POA: Diagnosis not present

## 2020-07-28 DIAGNOSIS — I2510 Atherosclerotic heart disease of native coronary artery without angina pectoris: Secondary | ICD-10-CM

## 2020-07-28 DIAGNOSIS — E1169 Type 2 diabetes mellitus with other specified complication: Secondary | ICD-10-CM | POA: Diagnosis not present

## 2020-07-28 DIAGNOSIS — Z79899 Other long term (current) drug therapy: Secondary | ICD-10-CM

## 2020-07-28 NOTE — Progress Notes (Signed)
History of Present Illness:      This very nice 69 y.o.  MWM presents for 3 month follow up with HTN, HLD, Pre-Diabetes and Vitamin D Deficiency. Patient has COPD overlap OSA.  Patient has Aortic Atherosclerosis by Chest CT scan 11/2016.      Patient is treated for HTN (2013) & BP has been controlled at home. Today's BP is at goal -  130/66.  Patient has hx/o CAD  w/Stents in 2005. Patient has had no complaints of any cardiac type chest pain, palpitations, dyspnea / orthopnea / PND, dizziness, claudication, or dependent edema.      Hyperlipidemia is controlled with diet & meds. Patient denies myalgias or other med SE's. 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  Last A1c was not at goal:  Lab Results  Component Value Date   HGBA1C 7.2 (H) 04/23/2020           Further, the patient also has history of Vitamin D Deficiency and supplements vitamin D without any suspected side-effects. Last vitamin D was at goal:  Lab Results  Component Value Date   VD25OH 91 04/23/2020    Current Outpatient Medications on File Prior to Visit  Medication Sig  . acetaminophen  500 MG Take 2 tablets (1,000 mg )every 6 hours.   Marland Kitchen aspirin EC 81 MG  Take 1 tablet  daily.  . cetirizine  10 MG  Take as needed for allergies.   Marland Kitchen VITAMIN D 2000 u Take 10,000 Units  daily  . diclofenac  75 MG EC  Take one tab bid as needed for pain  . doxazosin  8 MG Take 1 tablet at Bedtime  for BP & Prostate  . fexofenadine  60 MG Take  daily as needed   . PROSCAR 5 MG tablet Take 1 tablet daily   . BREO ELLIPTA 100-25  INHALE 1 PUFF  DAILY  . gabapentin  100 MG  Take 1 to 3 capsules at Bedtime for Sleep   .  glimepiride  2 MG TAKE 1 TABLET  TWICE DAILY   . metFORMIN 1000 MG  Take 1 tablet 2 x /day with Meals for Diabetes  . ramipril 5 MG  Take 1 capsule Daily  . rosuvastatin  20 MG  Take 1 tab three nights a week     No Known Allergies  PMHx:   Past Medical History:  Diagnosis Date  . CAD (coronary artery disease)    a. s/p stent to RCA 2005; b. LHC 2006 after Nuc with inf ischemia: patent RCA stent, D1 80%, LAD 60% => med Rx;  c. LHC 04/18/11: Proximal LAD 60% (unchanged from previous catheterization), distal LAD 65% (small caliber), Dx 60%, proximal CFX 20%, mid CFX 50%, OM1 30%, proximal RCA 20%, mid RCA stent patent, EF 65%.=> med Rx  . GERD (gastroesophageal reflux disease)   . HLD (hyperlipidemia)   . Hypertension   . Obesity   . OSA (obstructive sleep apnea)    has a cpap-does not use-has a special mouthpiece he wears at night  . Type II or unspecified type diabetes mellitus without mention of  complication, not stated as uncontrolled   . Vitamin D deficiency   . Wears glasses     Immunization History  Administered Date(s) Administered  . Influenza, High Dose Seasonal PF 06/22/2017, 05/15/2019  . Influenza-Unspecified 06/02/2013, 08/18/2014  . Pneumococcal Conjugate-13 11/21/2016  . Pneumococcal Polysaccharide-23 08/27/2007, 01/01/2018  . Td 01/01/2018  . Tdap 08/27/2007  . Zoster 10/20/2013    Past Surgical History:  Procedure Laterality Date  . CARDIAC CATHETERIZATION  2005   stent  . CHONDROPLASTY  06/25/2017   Procedure: CHONDROPLASTY;  Surgeon: Dorna Leitz, MD;  Location: [facility];  Service: Orthopedics;;  . COLONOSCOPY  11/2011   Due 5 years Dr. Fuller Plan  . heart stent  2005   RCA  . INGUINAL HERNIA REPAIR Right 11/16/2014   Procedure: RIGHT INGUINAL HERNIA REPAIR WITH MESH;  Surgeon: Armandina Gemma, MD;  Location: [facility];  Service: General;  Laterality: Right;  . INSERTION OF MESH Right 11/16/2014   Procedure: INSERTION OF MESH;   Surgeon: Armandina Gemma, MD;  Location: [place] [facility];  Service: General;  Laterality: Right;  . KNEE ARTHROSCOPY WITH MEDIAL MENISECTOMY Bilateral 06/25/2017   Procedure: right knee arthroscopy with medial meniscectomy, chondroplasty, Left knee arthroscopy with medial menisectomy removal of plica and chondroplasty;  Surgeon: Dorna Leitz, MD;  Location: [facility];  Service: Orthopedics;  Laterality: Bilateral;  . POLYPECTOMY      FHx:    Reviewed / unchanged  SHx:    Reviewed / unchanged   Systems Review:  Constitutional: Denies fever, chills, wt changes, headaches, insomnia, fatigue, night sweats, change in appetite. Eyes: Denies redness, blurred vision, diplopia, discharge, itchy, watery eyes.  ENT: Denies discharge, congestion, post nasal drip, epistaxis, sore throat, earache, hearing loss, dental pain, tinnitus, vertigo, sinus pain, snoring.  CV: Denies chest pain, palpitations, irregular heartbeat, syncope, dyspnea, diaphoresis, orthopnea, PND, claudication or edema. Respiratory: denies cough, dyspnea, DOE, pleurisy, hoarseness, laryngitis, wheezing.  Gastrointestinal: Denies dysphagia, odynophagia, heartburn, reflux, water brash, abdominal pain or cramps, nausea, vomiting, bloating, diarrhea, constipation, hematemesis, melena, hematochezia  or hemorrhoids. Genitourinary: Denies dysuria, frequency, urgency, nocturia, hesitancy, discharge, hematuria or flank pain. Musculoskeletal: Denies arthralgias, myalgias, stiffness, jt. swelling, pain, limping or strain/sprain.  Skin: Denies pruritus, rash, hives, warts, acne, eczema or change in skin lesion(s). Neuro: No weakness, tremor, incoordination, spasms, paresthesia or pain. Psychiatric: Denies confusion, memory loss or sensory loss. Endo: Denies change in weight, skin or hair change.  Heme/Lymph: No excessive bleeding, bruising or enlarged lymph nodes.  Physical Exam  BP 130/66   Pulse 89   Temp (!) 97 F  (36.1 C)   Resp 16   Ht 5' 10.5" (1.791 m)   Wt 197 lb 3.2 oz (89.4 kg)   SpO2 99%   BMI 27.90 kg/m   Appears  over nourished, well groomed  and in no distress.  Eyes: PERRLA, EOMs, conjunctiva no swelling or erythema. Sinuses: No frontal/maxillary tenderness ENT/Mouth: EAC's clear, TM's nl w/o erythema, bulging. Nares clear w/o erythema, swelling, exudates. Oropharynx clear without erythema or exudates. Oral hygiene is good. Tongue normal, non obstructing. Hearing intact.  Neck: Supple. Thyroid not palpable. Car 2+/2+ without bruits, nodes or JVD. Chest: Respirations nl with BS clear & equal w/o rales, rhonchi, wheezing or stridor.  Cor: Heart sounds normal w/ regular rate and rhythm without sig. murmurs, gallops, clicks or rubs. Peripheral pulses normal and equal  without edema.  Abdomen: Soft & bowel sounds normal. Non-tender w/o guarding, rebound, hernias,  masses or organomegaly.  Lymphatics: Unremarkable.  Musculoskeletal: Full ROM all peripheral extremities, joint stability, 5/5 strength and normal gait.  Skin: Warm, dry without exposed rashes, lesions or ecchymosis apparent.  Neuro: Cranial nerves intact, reflexes equal bilaterally. Sensory-motor testing grossly intact. Tendon reflexes grossly intact.  Pysch: Alert & oriented x 3.  Insight and judgement nl & appropriate. No ideations.  Assessment and Plan:  1. Essential hypertension  - Continue medication, monitor blood pressure at home.  - Continue DASH diet.  Reminder to go to the ER if any CP,  SOB, nausea, dizziness, severe HA, changes vision/speech.  - CBC with Differential/Platelet - COMPLETE METABOLIC PANEL WITH GFR - Magnesium - TSH  2. Hyperlipidemia associated with type 2 diabetes mellitus ([place])  - Continue diet/meds, exercise,& lifestyle modifications.  - Continue monitor periodic cholesterol/liver & renal functions   - Lipid panel - TSH  3. Type 2 diabetes mellitus with stage 1 chronic kidney  disease,  without long-term current use of insulin ([facility])  - Continue diet, exercise  - Lifestyle modifications.  - Monitor appropriate labs.  - Fructosamine - Insulin, random  4. Vitamin D deficiency  - Continue supplementation.   5. Coronary artery disease involving native heart without angina  - Lipid panel  6. Aortic atherosclerosis ([facility])  - Lipid panel  7. Medication management  - CBC with Differential/Platelet - COMPLETE METABOLIC PANEL WITH GFR - Magnesium - Lipid panel - TSH - Fructosamine - Insulin, random        Discussed  regular exercise, BP monitoring, weight control to achieve/maintain BMI less than 25 and discussed med and SE's. Recommended labs to assess and monitor clinical status with further disposition pending results of labs.  I discussed the assessment and treatment plan with the patient. The patient was provided an opportunity to ask questions and all were answered. The patient agreed with the plan and demonstrated an understanding of the instructions.  I provided over 30 minutes of exam, counseling, chart review and  complex critical decision making.   Kirtland Bouchard, MD

## 2020-07-28 NOTE — Patient Instructions (Signed)

## 2020-07-29 NOTE — Progress Notes (Signed)
========================================================== ==========================================================  -    Glucose = 223 mg% - high - So . . . . . .  Recommend work harder on diet & weight loss  - Avoid Sweets, Candy & White Stuff   - Rice, Potatoes, Breads &  Pasta  - So  don't have to add a 3rd very expensive Diabetic medicine or  add Insulin shots to get a lower blood sugar to hopefully avoid all of the  probable complications of poorly controlled Diabetes.   ==========================================================  Being diabetic has a  300% increased risk for heart attack,  stroke, cancer, and alzheimer- type vascular dementia.   1 Avoiding all foods that are white except chicken,   fish & calliflower.  - Avoid white rice  (brown & wild rice is OK),   - Avoid white potatoes  (sweet potatoes in moderation is OK),   White bread or wheat bread or anything made out of   white flour like bagels, donuts, rolls, buns, biscuits, cakes,  - pastries, cookies, pizza crust, and pasta (made from  white flour & egg whites)   - vegetarian pasta or spinach or wheat pasta is OK.  - Multigrain breads like Arnold's, Pepperidge Farm or   multigrain sandwich thins or high fiber breads like   [place] bread or "Dave's Killer" breads that are  4 to 5 grams fiber per slice !  are best.    Diet, exercise and weight loss can reverse and cure  diabetes in the early stages.    - Diet, exercise and weight loss is very important in the   control and prevention of complications of diabetes which  affects every system in your body, ie.   -Brain - dementia/stroke,  - eyes - glaucoma/blindness,  - heart - heart attack/heart failure,  - kidneys - dialysis,  - stomach - gastric paralysis,  - intestines - malabsorption,  - nerves - severe painful neuritis,  - circulation - gangrene & loss of a leg(s)  - and finally  . . . . . . . . . . . . . . . . . .    - cancer and  Alzheimers. ==========================================================  -   Magnesium  -   1.8   -  very  low- goal is betw 2.0 - 2.5,   - So..............Marland Kitchen  Recommend that you take  Magnesium 500 mg tablet daily   - also important to eat lots of  leafy green vegetables   - spinach - Kale - collards - greens - okra - asparagus  - broccoli - quinoa - squash - almonds   - black, red, white beans  -  peas - green beans ========================================================  -  Total Chol= 103 and LDL Chol =43 - Both  Excellent   - Very low risk for Heart Attack  / Stroke ========================================================  - All Else - CBC - Kidneys - Electrolytes - Liver & Thyroid    - all  Normal / OK ========================================================

## 2020-07-31 ENCOUNTER — Encounter: Payer: Self-pay | Admitting: Internal Medicine

## 2020-08-01 LAB — CBC WITH DIFFERENTIAL/PLATELET
Absolute Monocytes: 561 cells/uL (ref 200–950)
Basophils Absolute: 37 cells/uL (ref 0–200)
Basophils Relative: 0.4 %
Eosinophils Absolute: 386 cells/uL (ref 15–500)
Eosinophils Relative: 4.2 %
HCT: 43.4 % (ref 38.5–50.0)
Hemoglobin: 14.9 g/dL (ref 13.2–17.1)
Lymphs Abs: 1904 cells/uL (ref 850–3900)
MCH: 32.1 pg (ref 27.0–33.0)
MCHC: 34.3 g/dL (ref 32.0–36.0)
MCV: 93.5 fL (ref 80.0–100.0)
MPV: 12.2 fL (ref 7.5–12.5)
Monocytes Relative: 6.1 %
Neutro Abs: 6311 cells/uL (ref 1500–7800)
Neutrophils Relative %: 68.6 %
Platelets: 172 10*3/uL (ref 140–400)
RBC: 4.64 10*6/uL (ref 4.20–5.80)
RDW: 12 % (ref 11.0–15.0)
Total Lymphocyte: 20.7 %
WBC: 9.2 10*3/uL (ref 3.8–10.8)

## 2020-08-01 LAB — COMPLETE METABOLIC PANEL WITH GFR
AG Ratio: 2 (calc) (ref 1.0–2.5)
ALT: 19 U/L (ref 9–46)
AST: 14 U/L (ref 10–35)
Albumin: 4.3 g/dL (ref 3.6–5.1)
Alkaline phosphatase (APISO): 75 U/L (ref 35–144)
BUN: 12 mg/dL (ref 7–25)
CO2: 26 mmol/L (ref 20–32)
Calcium: 9.6 mg/dL (ref 8.6–10.3)
Chloride: 102 mmol/L (ref 98–110)
Creat: 0.83 mg/dL (ref 0.70–1.25)
GFR, Est African American: 105 mL/min/{1.73_m2} (ref >=60)
GFR, Est Non African American: 90 mL/min/{1.73_m2} (ref >=60)
Globulin: 2.2 g/dL (calc) (ref 1.9–3.7)
Glucose, Bld: 223 mg/dL — ABNORMAL HIGH (ref 65–99)
Potassium: 4.3 mmol/L (ref 3.5–5.3)
Sodium: 140 mmol/L (ref 135–146)
Total Bilirubin: 0.5 mg/dL (ref 0.2–1.2)
Total Protein: 6.5 g/dL (ref 6.1–8.1)

## 2020-08-01 LAB — FRUCTOSAMINE: Fructosamine: 289 umol/L — ABNORMAL HIGH (ref 205–285)

## 2020-08-01 LAB — LIPID PANEL
Cholesterol: 103 mg/dL (ref <200)
HDL: 37 mg/dL — ABNORMAL LOW (ref >=40)
LDL Cholesterol (Calc): 43 mg/dL (calc)
Non-HDL Cholesterol (Calc): 66 mg/dL (calc) (ref <130)
Total CHOL/HDL Ratio: 2.8 (calc) (ref <5.0)
Triglycerides: 147 mg/dL (ref <150)

## 2020-08-01 LAB — MAGNESIUM: Magnesium: 1.8 mg/dL (ref 1.5–2.5)

## 2020-08-01 LAB — INSULIN, RANDOM: Insulin: 19.4 u[IU]/mL

## 2020-08-01 LAB — TSH: TSH: 1.02 mIU/L (ref 0.40–4.50)

## 2020-08-01 NOTE — Progress Notes (Signed)
========================================================== -   Test results slightly outside the reference range are not unusual. If there is anything important, I will review this with you,  otherwise it is considered normal test values.  If you have further questions,  please do not hesitate to contact me at the office or via My Chart.  ==========================================================  -  Fructose =289 mg% returned elevated which is a 6 week average glucose at 2 hours after a meal,  (Normal would be about 120 mg%  !  )   - So, you need to work harder on diet    - Avoid Sweets, Candy & White Stuff   - Rice, Potatoes, Breads &  Pasta ==========================================================

## 2020-08-02 ENCOUNTER — Other Ambulatory Visit: Payer: Self-pay | Admitting: Internal Medicine

## 2020-08-02 DIAGNOSIS — G2581 Restless legs syndrome: Secondary | ICD-10-CM

## 2020-08-02 DIAGNOSIS — N390 Urinary tract infection, site not specified: Secondary | ICD-10-CM | POA: Diagnosis not present

## 2020-08-02 MED ORDER — ROPINIROLE HCL 1 MG PO TABS
ORAL_TABLET | ORAL | 0 refills | Status: DC
Start: 2020-08-02 — End: 2020-09-30

## 2020-08-05 ENCOUNTER — Other Ambulatory Visit: Payer: Self-pay | Admitting: Adult Health

## 2020-09-07 DIAGNOSIS — E119 Type 2 diabetes mellitus without complications: Secondary | ICD-10-CM | POA: Diagnosis not present

## 2020-09-07 DIAGNOSIS — H2513 Age-related nuclear cataract, bilateral: Secondary | ICD-10-CM | POA: Diagnosis not present

## 2020-09-07 LAB — HM DIABETES EYE EXAM

## 2020-09-29 ENCOUNTER — Encounter: Payer: Self-pay | Admitting: Internal Medicine

## 2020-09-30 ENCOUNTER — Telehealth: Payer: Self-pay | Admitting: *Deleted

## 2020-09-30 ENCOUNTER — Other Ambulatory Visit: Payer: Self-pay | Admitting: Internal Medicine

## 2020-09-30 ENCOUNTER — Other Ambulatory Visit: Payer: Self-pay | Admitting: Adult Health

## 2020-09-30 DIAGNOSIS — E119 Type 2 diabetes mellitus without complications: Secondary | ICD-10-CM

## 2020-09-30 DIAGNOSIS — G2581 Restless legs syndrome: Secondary | ICD-10-CM

## 2020-09-30 NOTE — Telephone Encounter (Signed)
Returned call to patient regarding referral to diabetic specialist. He reports his blood sugar has been about 200, the last few glucose checks at home. Per Dr Melford Aase, his fructosamine levelat 07/2020 lab was equal to approximately 7 f a little less. Patient also advised a pharmacist will be in our office soon to advise patient on their medications. Patient has an appointment here 10/28/2020 and will wait until to discuss specialist appointment.

## 2020-10-12 DIAGNOSIS — N401 Enlarged prostate with lower urinary tract symptoms: Secondary | ICD-10-CM | POA: Diagnosis not present

## 2020-10-12 DIAGNOSIS — R350 Frequency of micturition: Secondary | ICD-10-CM | POA: Diagnosis not present

## 2020-10-12 DIAGNOSIS — R351 Nocturia: Secondary | ICD-10-CM | POA: Diagnosis not present

## 2020-10-13 ENCOUNTER — Other Ambulatory Visit: Payer: Self-pay

## 2020-10-13 ENCOUNTER — Encounter: Payer: Self-pay | Admitting: Adult Health

## 2020-10-13 ENCOUNTER — Ambulatory Visit (INDEPENDENT_AMBULATORY_CARE_PROVIDER_SITE_OTHER): Payer: Medicare Other | Admitting: Adult Health

## 2020-10-13 ENCOUNTER — Telehealth: Payer: Self-pay | Admitting: Cardiovascular Disease

## 2020-10-13 VITALS — BP 142/76 | HR 100 | Temp 97.3°F | Wt 189.0 lb

## 2020-10-13 DIAGNOSIS — G5622 Lesion of ulnar nerve, left upper limb: Secondary | ICD-10-CM | POA: Diagnosis not present

## 2020-10-13 DIAGNOSIS — E1159 Type 2 diabetes mellitus with other circulatory complications: Secondary | ICD-10-CM | POA: Diagnosis not present

## 2020-10-13 MED ORDER — MELOXICAM 15 MG PO TABS: ORAL_TABLET | ORAL | 1 refills | Status: DC

## 2020-10-13 MED ORDER — GLIMEPIRIDE 4 MG PO TABS: ORAL_TABLET | ORAL | 1 refills | Status: DC

## 2020-10-13 NOTE — Patient Instructions (Signed)
Meloxicam tablets What is this medicine? MELOXICAM (mel OX i cam) is a non-steroidal anti-inflammatory drug, also known as an NSAID. It is used to treat pain, inflammation, and swelling. This medicine may be used for other purposes; ask your health care provider or pharmacist if you have questions. COMMON BRAND NAME(S): Mobic What should I tell my health care provider before I take this medicine? They need to know if you have any of these conditions:  asthma (lung or breathing disease)  bleeding disorder  coronary artery bypass graft (CABG) within the past 2 weeks  dehydration  heart attack  heart disease  heart failure  high blood pressure  if you often drink alcohol  kidney disease  liver disease  smoke tobacco cigarettes  stomach bleeding  stomach ulcers, other stomach or intestine problems  take medicines that treat or prevent blood clots  taking other steroids like dexamethasone or prednisone  an unusual or allergic reaction to meloxicam, other medicines, foods, dyes, or preservatives  pregnant or trying to get pregnant  breast-feeding How should I use this medicine? Take this medicine by mouth. Take it as directed on the prescription label at the same time every day. You can take it with or without food. If it upsets your stomach, take it with food. Do not use it more often than directed. There may be unused or extra doses in the bottle after you finish your treatment. Talk to your health care provider if you have questions about your dose. A special MedGuide will be given to you by the pharmacist with each prescription and refill. Be sure to read this information carefully each time. Talk to your health care provider about the use of this medicine in children. Special care may be needed. Patients over 11 years of age may have a stronger reaction and need a smaller dose. Overdosage: If you think you have taken too much of this medicine contact a poison  control center or emergency room at once. NOTE: This medicine is only for you. Do not share this medicine with others. What if I miss a dose? If you miss a dose, take it as soon as you can. If it is almost time for your next dose, take only that dose. Do not take double or extra doses. What may interact with this medicine? Do not take this medicine with any of the following medications:  cidofovir  ketorolac This medicine may also interact with the following medications:  aspirin and aspirin-like medicines  certain medicines for blood pressure, heart disease, irregular heart beat  certain medicines for depression, anxiety, or psychotic disturbances  certain medicines that treat or prevent blood clots like warfarin, enoxaparin, dalteparin, apixaban, dabigatran, rivaroxaban  cyclosporine  diuretics  fluconazole  lithium  methotrexate  other NSAIDs, medicines for pain and inflammation, like ibuprofen and naproxen  pemetrexed This list may not describe all possible interactions. Give your health care provider a list of all the medicines, herbs, non-prescription drugs, or dietary supplements you use. Also tell them if you smoke, drink alcohol, or use illegal drugs. Some items may interact with your medicine. What should I watch for while using this medicine? Visit your health care provider for regular checks on your progress. Tell your health care provider if your symptoms do not start to get better or if they get worse. Do not take other medicines that contain aspirin, ibuprofen, or naproxen with this medicine. Side effects such as stomach upset, nausea, or ulcers may be  more likely to occur. Many non-prescription medicines contain aspirin, ibuprofen, or naproxen. Always read labels carefully. This medicine can cause serious ulcers and bleeding in the stomach. It can happen with no warning. Smoking, drinking alcohol, older age, and poor health can also increase risks. Call your  health care provider right away if you have stomach pain or blood in your vomit or stool. This medicine does not prevent a heart attack or stroke. This medicine may increase the chance of a heart attack or stroke. The chance may increase the longer you use this medicine or if you have heart disease. If you take aspirin to prevent a heart attack or stroke, talk to your health care provider about using this medicine. Alcohol may interfere with the effect of this medicine. Avoid alcoholic drinks. This medicine may cause serious skin reactions. They can happen weeks to months after starting the medicine. Contact your health care provider right away if you notice fevers or flu-like symptoms with a rash. The rash may be red or purple and then turn into blisters or peeling of the skin. Or, you might notice a red rash with swelling of the face, lips or lymph nodes in your neck or under your arms. Talk to your health care provider if you are pregnant before taking this medicine. Taking this medicine between weeks 20 and 30 of pregnancy may harm your unborn baby. Your health care provider will monitor you closely if you need to take it. After 30 weeks of pregnancy, do not take this medicine. You may get drowsy or dizzy. Do not drive, use machinery, or do anything that needs mental alertness until you know how this medicine affects you. Do not stand up or sit up quickly, especially if you are an older patient. This reduces the risk of dizzy or fainting spells. Be careful brushing or flossing your teeth or using a toothpick because you may get an infection or bleed more easily. If you have any dental work done, tell your dentist you are receiving this medicine. This medicine may make it more difficult to get pregnant. Talk to your health care provider if you are concerned about your fertility. What side effects may I notice from receiving this medicine? Side effects that you should report to your doctor or health care  professional as soon as possible:  allergic reactions (skin rash, itching or hives; swelling of the face, lips, or tongue)  bleeding (bloody or black, tarry stools; red or dark brown urine; spitting up blood or brown material that looks like coffee grounds; red spots on the skin; unusual bruising or bleeding from the eyes, gums, or nose)  blood clot (chest pain; shortness of breath; pain, swelling, or warmth in the leg)  general ill feeling or flu-like symptoms  high potassium levels (chest pain; fast, irregular heartbeat; muscle weakness)  kidney injury (trouble passing urine or change in the amount of urine)  light-colored stool  liver injury (dark yellow or brown urine; general ill feeling or flu-like symptoms; loss of appetite, right upper belly pain; unusually weak or tired, yellowing of the eyes or skin)  low red blood cell counts (trouble breathing; feeling faint; lightheaded, falls; unusually weak or tired)  rash, fever, and swollen lymph nodes  redness, blistering, peeling, or loosening of the skin, including inside the mouth  stroke (changes in vision; confusion; trouble speaking or understanding; severe headaches; sudden numbness or weakness of the face, arm or leg; trouble walking; dizziness; loss of balance or coordination) Side  effects that usually do not require medical attention (report to your doctor or health care professional if they continue or are bothersome):  constipation  diarrhea  dizziness  gas  headache  nausea, vomiting This list may not describe all possible side effects. Call your doctor for medical advice about side effects. You may report side effects to FDA at 1-800-FDA-1088. Where should I keep my medicine? Keep out of the reach of children and pets. Store at room temperature between 20 and 25 degrees C (68 and 77 degrees F). Protect from moisture. Keep the container tightly closed. Get rid of any unused medicine after the expiration  date. To get rid of medicines that are no longer needed or have expired:  Take the medicine to a medicine take-back program. Check with your pharmacy or law enforcement to find a location.  If you cannot return the medicine, check the label or package insert to see if the medicine should be thrown out in the garbage or flushed down the toilet. If you are not sure, ask your health care provider. If it is safe to put it in the trash, empty the medicine out of the container. Mix the medicine with cat litter, dirt, coffee grounds, or other unwanted substance. Seal the mixture in a bag or container. Put it in the trash. NOTE: This sheet is a summary. It may not cover all possible information. If you have questions about this medicine, talk to your doctor, pharmacist, or health care provider.  2021 Elsevier/Gold Standard (2020-07-12 15:14:36)     Cubital Tunnel Syndrome  Cubital tunnel syndrome is a condition that causes pain and weakness of the forearm and hand. It happens when one of the nerves that runs along the inside of the elbow joint (ulnar nerve) becomes irritated. This condition is usually caused by repeated arm motions that are done during sports or work-related activities. What are the causes? This condition may be caused by:  Increased pressure on the ulnar nerve at the elbow, arm, or forearm. This can result from: ? Irritation caused by repeated elbow bending. ? Poorly healed elbow fractures. ? Tumors in the elbow. These are usually noncancerous (benign). ? Scar tissue that develops in the elbow after an injury. ? Bony growths (spurs) near the ulnar nerve.  Stretching of the nerve due to loose elbow ligaments.  Trauma to the nerve at the elbow. What increases the risk? The following factors may make you more likely to develop this condition:  Doing manual labor that requires frequent bending of the elbow.  Playing sports that include repeated or strenuous throwing motions,  such as baseball.  Playing contact sports, such as football or lacrosse.  Not warming up properly before activities.  Having diabetes.  Having an underactive thyroid (hypothyroidism). What are the signs or symptoms? Symptoms of this condition include:  Clumsiness or weakness of the hand.  Tenderness of the inner elbow.  Aching or soreness of the inner elbow, forearm, or fingers, especially the little finger or the ring finger.  Increased pain when forcing the elbow to bend.  Reduced control when throwing objects.  Tingling, numbness, or a burning feeling inside the forearm or in part of the hand or fingers, especially the little finger or the ring finger.  Sharp pains that shoot from the elbow down to the wrist and hand.  The inability to grip or pinch hard. How is this diagnosed? This condition is diagnosed based on:  Your symptoms and medical history. Your health care  provider will also ask for details about any injury.  A physical exam. You may also have tests, including:  Electromyogram (EMG). This test measures electrical signals sent by your nerves into the muscles.  Nerve conduction study. This test measures how well electrical signals pass through your nerves.  Imaging tests, such as X-rays, ultrasound, and MRI. These tests check for possible causes of your condition. How is this treated? This condition may be treated by:  Stopping the activities that are causing your symptoms to get worse.  Icing and taking medicines to reduce pain and swelling.  Wearing a splint to prevent your elbow from bending, or wearing an elbow pad where the ulnar nerve is closest to the skin.  Working with a physical therapist in less severe cases. This may help to: ? Decrease your symptoms. ? Improve the strength and range of motion of your elbow, forearm, and hand. If these treatments do not help, surgery may be needed. Follow these instructions at home: If you have a  splint:  Wear the splint as told by your health care provider. Remove it only as told by your health care provider.  Loosen the splint if your fingers tingle, become numb, or turn cold and blue.  Keep the splint clean.  If the splint is not waterproof: ? Do not let it get wet. ? Cover it with a watertight covering when you take a bath or shower. Managing pain, stiffness, and swelling  If directed, put ice on the injured area: ? Put ice in a plastic bag. ? Place a towel between your skin and the bag. ? Leave the ice on for 20 minutes, 2-3 times a day.  Move your fingers often to avoid stiffness and to lessen swelling.  Raise (elevate) the injured area above the level of your heart while you are sitting or lying down.   General instructions  Take over-the-counter and prescription medicines only as told by your health care provider.  Do any exercise or physical therapy as told by your health care provider.  Do not drive or use heavy machinery while taking prescription pain medicine.  If you were given an elbow pad, wear it as told by your health care provider.  Keep all follow-up visits as told by your health care provider. This is important. Contact a health care provider if:  Your symptoms get worse.  Your symptoms do not get better with treatment.  You have new pain.  Your hand on the injured side feels numb or cold. Summary  Cubital tunnel syndrome is a condition that causes pain and weakness of the forearm and hand.  You are more likely to develop this condition if you do work or play sports that involve repeated arm movements.  This condition is often treated by stopping repetitive activities, applying ice, and using anti-inflammatory medicines.  In rare cases, surgery may be needed. This information is not intended to replace advice given to you by your health care provider. Make sure you discuss any questions you have with your health care provider. Document  Revised: 12/17/2017 Document Reviewed: 12/17/2017 Elsevier Patient Education  [place].

## 2020-10-13 NOTE — Telephone Encounter (Signed)
That's fine

## 2020-10-13 NOTE — Progress Notes (Signed)
Assessment and Plan:  Joe Green was seen today for numbness.  Diagnoses and all orders for this visit:  Ulnar neuropathy of left upper extremity Unclear etiology, discussed referral to neuro or ortho for NCV to determine point of origin He declines at this time; wants to try conservative interventions first New onset, strength intact without muscle wasting Will try antiinflammatory; avoid sleeping on side, avoid resting on elbow or wrist;  Follow up as scheduled in 2 weeks; if not improving will recommend NCV with Dr. Berenice Primas or can refer to neuro -     meloxicam (MOBIC) 15 MG tablet; Take one daily with food for 2 weeks, can take with tylenol, can not take with aleve, iburpofen, then as needed daily for pain  Type 2 diabetes mellitus with other circulatory complication, without long-term current use of insulin (HCC) Cautiously increase glimepiride; reminded of risk of hypoglycemia, monitor closely  Monitor glucose closely; reduce back to 2 mg if glucose <100 Close follow up in 2 weeks  -     glimepiride (AMARYL) 4 MG tablet; TAKE 1/2-1 TABLET BY MOUTH TWICE DAILY WITH MEALS FOR DIABETES  Further disposition pending results of labs. Discussed med's effects and SE's.   Over 30 minutes of exam, counseling, chart review, and critical decision making was performed.   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   He reports this began gradually over the weekend, started noting intermittent numbness of L 4th and 5 th digits, also notes some tension in posterior upper arm, also with left shoulder pain (scapular area). He notes sleeps on his left side chronically, states has tried to sleep on back but chokes, struggles to sleep on right.   He has note tried anything for current sx. Denies atypical events, injury prior to onset. Denies any joint pain.   He reports glucose have been running higher than usual, reports fasting persistently 200+, reports has been taking metofrmin 4 tabs, glimepiride 2 mg BID; he would like to get back on ozempic Lab Results  Component Value Date   HGBA1C 7.2 (H) 04/23/2020     Past Medical History:  Diagnosis Date  . CAD (coronary artery disease)    a. s/p stent to RCA 2005; b. LHC 2006 after Nuc with inf ischemia: patent RCA stent, D1 80%, LAD 60% => med Rx;  c. LHC 04/18/11: Proximal LAD 60% (unchanged from previous catheterization), distal LAD 65% (small caliber), Dx 60%, proximal CFX 20%, mid CFX 50%, OM1 30%, proximal RCA 20%, mid RCA stent patent, EF 65%.=> med Rx  . GERD (gastroesophageal reflux disease)   . HLD (hyperlipidemia)   . Hypertension   . Obesity   . OSA (obstructive sleep apnea)    has a cpap-does not use-has a special mouthpiece he wears at night  . Type II or unspecified type diabetes mellitus without mention of complication, not stated as  uncontrolled   . Vitamin D deficiency   . Wears glasses      No Known Allergies  Current Outpatient Medications on File Prior to Visit  Medication Sig  . acetaminophen (TYLENOL) 500 MG tablet Take 2 tablets (1,000 mg total) by mouth every 6 (six) hours. (Patient taking differently: Take 1,000 mg by mouth as needed.)  . aspirin EC 81 MG tablet Take 1 tablet (81 mg total) by mouth daily.  . benzonatate (TESSALON) 200 MG capsule  Take  1 capsule  3 x /day  to Prevent Cough  . blood glucose meter kit and supplies Dispense based on patient and insurance preference. Use up to four times daily as directed. (FOR ICD-9 250.00, 250.01).  . blood glucose meter kit and supplies Dispense based on patient and insurance preference. Use up to four times daily as directed. (FOR ICD-10 E10.9, E11.9).  . cetirizine (ZYRTEC) 10 MG tablet Take 10 mg by mouth as needed for allergies.   . Cholecalciferol (VITAMIN D) 50 MCG (2000 UT) tablet Take 1 tablet (2,000 Units total) by mouth daily. (Patient taking differently: Take 10,000 Units by mouth daily.)  . doxazosin (CARDURA) 8 MG tablet Take 1 tablet   at Bedtime    for BP & Prostate  . fexofenadine (ALLEGRA) 60 MG tablet Take 60 mg by mouth daily as needed for allergies or rhinitis.  . finasteride (PROSCAR) 5 MG tablet Take 1 tablet daily for prostate.  . fluticasone furoate-vilanterol (BREO ELLIPTA) 100-25 MCG/INH AEPB INHALE 1 PUFF BY MOUTH ONCE DAILY. RINSE MOUTH WITH WATER AFTER EACH USE.  Marland Kitchen glimepiride (AMARYL) 2 MG tablet TAKE 1 TABLET BY MOUTH TWICE DAILY WITH MEALS FOR DIABETES  . glucose blood test strip Test blood sugars once daily.Dx: E11.9  . metFORMIN (GLUCOPHAGE) 1000 MG tablet Take  1 tablet  2 x /day  with Meals  for Diabetes  . ramipril (ALTACE) 5 MG capsule Take 1 capsule by mouth once daily  . rOPINIRole (REQUIP) 1 MG tablet Take  1 tablet  3 x /day  for Restless Legs  . rosuvastatin (CRESTOR) 20 MG tablet Take 1 tab three nights a week for cholesterol.  . diclofenac (VOLTAREN) 75 MG EC tablet Take one tab po bid x 2 weeks and then take as needed for pain (Patient not taking: Reported on 10/13/2020)  . gabapentin (NEURONTIN) 100 MG capsule Take 1 to 3 capsules at Bedtime for Sleep & Restless Legs (Patient not taking: Reported on 10/13/2020)   No current facility-administered medications on file prior to visit.    ROS: all negative except above.   Physical Exam:  BP (!)  142/76   Pulse 100   Temp (!) 97.3 F (36.3 C)   Wt 189 lb (85.7 kg)   SpO2 94%   BMI 26.74 kg/m   General Appearance: Well nourished, in no apparent distress. Eyes: PERRLA, conjunctiva no swelling or erythema ENT/Mouth: Ext aud canals clear, TMs without erythema, bulging. No erythema, swelling, or exudate on post pharynx.  Tonsils not swollen or erythematous. Hearing normal.  Neck: Supple, thyroid normal.  Respiratory: Respiratory effort normal, BS equal bilaterally without rales, rhonchi, wheezing or stridor.  Cardio: RRR with no MRGs. Brisk peripheral pulses without edema bil upper extremities.  Lymphatics: Non tender without lymphadenopathy.  Musculoskeletal: Full ROM neck, bil shoulders, elbows, wrists, hands, 5/5 strength, no muscle wasting in hand, forearm. No swelling or mass along ulnar groove.  Skin: Warm, dry without rashes, lesions, ecchymosis.  Neuro: Cranial nerves  intact. Normal muscle tone,  Sensation intact other than decreased sensation to left 4th and 5th digits of hand, mildly extending into palm Psych: Awake and oriented X 3, normal affect, Insight and Judgment appropriate.     Izora Ribas, NP 2:51 PM [facility]

## 2020-10-13 NOTE — Telephone Encounter (Signed)
Patient is having a tingling sensation that is going down his left arm. His left hand is numb. He wanted to be worked in today if possible

## 2020-10-13 NOTE — Telephone Encounter (Signed)
Called patient about his message. Patient is concerned about having numbness and tingling in his left arm and pain in his neck for the last 2 days. Patient wanted to be seen today. There are no openings today. Patient stated he thinks this is due to his heart. Went through signs and symptoms of stroke and patient has no other deficits. Patient stated he would not be going to the ED. Encouraged patient to call his PCP or go to urgent care, that it sounds like it more neurological then heart related. Made patient an appointment with [facility] on Friday, next available appointment. Will forward to Dr. Johnsie Cancel for further advisement.

## 2020-10-14 NOTE — Progress Notes (Deleted)
Cardiology Office Note   Date:  10/14/2020   ID:  Joe Green, DOB May 19, 1951, MRN [id number]  PCP:  [name], MD    No chief complaint on file.  CAD  Wt Readings from Last 3 Encounters:  10/13/20 189 lb (85.7 kg)  07/28/20 197 lb 3.2 oz (89.4 kg)  04/23/20 194 lb (88 kg)       History of Present Illness: Joe Green is a 70 y.o. male  Patient of Dr. Johnsie Cancel.  Per records, he has "with COPD , smoking, DM2, HTN, HLD referred by primary for left arm pain / numbness ? Anginal equivalent I have not seen him in 5 years He had stent to RCA in 2005 Chronic RBBB Last cath 2006 with residual D1 and 60% LAD disease Similar moderate LAD/D1 disease and patent RCA stent by cath in September 2012   Fell and fractured ankle and some left sided ribs earlier in 2020 Has been having left arm numbness for a few months  Not like his previous angina before stent when he had heaviness/tightness in chest Pain in shoulder And neck sometimes "  Patient called in with "Patient is concerned about having numbness and tingling in his left arm and pain in his neck for the last 2 days. Patient wanted to be seen today. There are no openings today. Patient stated he thinks this is due to his heart. Went through signs and symptoms of stroke and patient has no other deficits. Patient stated he would not be going to the ED. Encouraged patient to call his PCP or go to urgent care, that it sounds like it more neurological then heart related. Made patient an appointment with DOD on Friday, next available appointment."  Past Medical History:  Diagnosis Date  . CAD (coronary artery disease)    a. s/p stent to RCA 2005; b. LHC 2006 after Nuc with inf ischemia: patent RCA stent, D1 80%, LAD 60% => med Rx;  c. LHC 04/18/11: Proximal LAD 60% (unchanged from previous catheterization), distal LAD 65% (small caliber), Dx 60%, proximal CFX 20%, mid CFX 50%, OM1 30%, proximal RCA 20%, mid RCA stent patent, EF  65%.=> med Rx  . GERD (gastroesophageal reflux disease)   . HLD (hyperlipidemia)   . Hypertension   . Obesity   . OSA (obstructive sleep apnea)    has a cpap-does not use-has a special mouthpiece he wears at night  . Type II or unspecified type diabetes mellitus without mention of complication, not stated as uncontrolled   . Vitamin D deficiency   . Wears glasses     Past Surgical History:  Procedure Laterality Date  . CARDIAC CATHETERIZATION  2005   stent  . CHONDROPLASTY  06/25/2017   Procedure: CHONDROPLASTY;  Surgeon: Dorna Leitz, MD;  Location: [facility];  Service: Orthopedics;;  . COLONOSCOPY  11/2011   Due 5 years Dr. Fuller Plan  . heart stent  2005   RCA  . INGUINAL HERNIA REPAIR Right 11/16/2014   Procedure: RIGHT INGUINAL HERNIA REPAIR WITH MESH;  Surgeon: Armandina Gemma, MD;  Location: [facility];  Service: General;  Laterality: Right;  . INSERTION OF MESH Right 11/16/2014   Procedure: INSERTION OF MESH;  Surgeon: Armandina Gemma, MD;  Location: [facility];  Service: General;  Laterality: Right;  . KNEE ARTHROSCOPY WITH MEDIAL MENISECTOMY Bilateral 06/25/2017   Procedure: right knee arthroscopy with medial meniscectomy, chondroplasty, Left knee arthroscopy with medial menisectomy removal of  plica and chondroplasty;  Surgeon: Dorna Leitz, MD;  Location: [facility];  Service: Orthopedics;  Laterality: Bilateral;  . POLYPECTOMY       Current Outpatient Medications  Medication Sig Dispense Refill  . acetaminophen (TYLENOL) 500 MG tablet Take 2 tablets (1,000 mg total) by mouth every 6 (six) hours. (Patient taking differently: Take 1,000 mg by mouth as needed.) 30 tablet 0  . aspirin EC 81 MG tablet Take 1 tablet (81 mg total) by mouth daily. 90 tablet 3  . benzonatate (TESSALON) 200 MG capsule Take  1 capsule  3 x /day  to Prevent Cough 90 capsule 0  . blood glucose meter kit and supplies Dispense based on patient and  insurance preference. Use up to four times daily as directed. (FOR ICD-9 250.00, 250.01). 1 each 0  . blood glucose meter kit and supplies Dispense based on patient and insurance preference. Use up to four times daily as directed. (FOR ICD-10 E10.9, E11.9). 1 each 0  . cetirizine (ZYRTEC) 10 MG tablet Take 10 mg by mouth as needed for allergies.     . Cholecalciferol (VITAMIN D) 50 MCG (2000 UT) tablet Take 1 tablet (2,000 Units total) by mouth daily. (Patient taking differently: Take 10,000 Units by mouth daily.)    . doxazosin (CARDURA) 8 MG tablet Take 1 tablet   at Bedtime    for BP & Prostate 90 tablet 0  . fexofenadine (ALLEGRA) 60 MG tablet Take 60 mg by mouth daily as needed for allergies or rhinitis.    . finasteride (PROSCAR) 5 MG tablet Take 1 tablet daily for prostate. 90 tablet 1  . fluticasone furoate-vilanterol (BREO ELLIPTA) 100-25 MCG/INH AEPB INHALE 1 PUFF BY MOUTH ONCE DAILY. RINSE MOUTH WITH WATER AFTER EACH USE. 60 each 5  . gabapentin (NEURONTIN) 100 MG capsule Take 1 to 3 capsules at Bedtime for Sleep & Restless Legs (Patient not taking: Reported on 10/13/2020) 270 capsule 0  . glimepiride (AMARYL) 4 MG tablet TAKE 1/2-1 TABLET BY MOUTH TWICE DAILY WITH MEALS FOR DIABETES 180 tablet 1  . glucose blood test strip Test blood sugars once daily.Dx: E11.9 100 each 2  . meloxicam (MOBIC) 15 MG tablet Take one daily with food for 2 weeks, can take with tylenol, can not take with aleve, iburpofen, then as needed daily for pain 30 tablet 1  . metFORMIN (GLUCOPHAGE) 1000 MG tablet Take  1 tablet  2 x /day  with Meals  for Diabetes 180 tablet 0  . ramipril (ALTACE) 5 MG capsule Take 1 capsule by mouth once daily 90 capsule 0  . rOPINIRole (REQUIP) 1 MG tablet Take  1 tablet  3 x /day  for Restless Legs 270 tablet 0  . rosuvastatin (CRESTOR) 20 MG tablet Take 1 tab three nights a week for cholesterol. 90 tablet 1   No current facility-administered medications for this visit.     Allergies:   Patient has no known allergies.    Social History:  The patient  reports that he has been smoking cigarettes. He started smoking about 52 years ago. He has a 51.00 pack-year smoking history. He has never used smokeless tobacco. He reports current alcohol use of about 28.0 standard drinks of alcohol per week. He reports that he does not use drugs.   Family History:  The patient's ***family history includes Cancer in his mother; Hypertension in his father; Kidney cancer in his maternal grandmother; Prostate cancer in his father.    ROS:  Please see the history of present illness.   Otherwise, review of systems are positive for ***.   All other systems are reviewed and negative.    PHYSICAL EXAM: VS:  There were no vitals taken for this visit. , BMI There is no height or weight on file to calculate BMI. GEN: Well nourished, well developed, in no acute distress  HEENT: normal  Neck: no JVD, carotid bruits, or masses Cardiac: ***RRR; no murmurs, rubs, or gallops,no edema  Respiratory:  clear to auscultation bilaterally, normal work of breathing GI: soft, nontender, nondistended, + BS MS: no deformity or atrophy  Skin: warm and dry, no rash Neuro:  Strength and sensation are intact Psych: euthymic mood, full affect   EKG:   The ekg ordered today demonstrates ***   Recent Labs: 07/28/2020: ALT 19; BUN 12; Creat 0.83; Hemoglobin 14.9; Magnesium 1.8; Platelets 172; Potassium 4.3; Sodium 140; TSH 1.02   Lipid Panel    Component Value Date/Time   CHOL 103 07/28/2020 1138   TRIG 147 07/28/2020 1138   HDL 37 (L) 07/28/2020 1138   CHOLHDL 2.8 07/28/2020 1138   VLDL 17 03/02/2017 1115   LDLCALC 43 07/28/2020 1138     Other studies Reviewed: Additional studies/ records that were reviewed today with results demonstrating: ***.   ASSESSMENT AND PLAN:  1. CAD:  2. HTN: 3. Hyperlipidemia: 4. DM: 5. COPD: 65. Numbness:   Current medicines are reviewed at length  with the patient today.  The patient concerns regarding his medicines were addressed.  The following changes have been made:  No change***  Labs/ tests ordered today include: *** No orders of the defined types were placed in this encounter.   Recommend 150 minutes/week of aerobic exercise Low fat, low carb, high fiber diet recommended  Disposition:   FU in ***   Signed, Larae Grooms, MD  10/14/2020 5:17 PM    [facility] [place], [place], [place]  [address] Phone: [phone]; Fax: [phone]

## 2020-10-15 ENCOUNTER — Ambulatory Visit: Payer: Medicare Other | Admitting: Interventional Cardiology

## 2020-10-15 DIAGNOSIS — F17200 Nicotine dependence, unspecified, uncomplicated: Secondary | ICD-10-CM

## 2020-10-15 DIAGNOSIS — E782 Mixed hyperlipidemia: Secondary | ICD-10-CM

## 2020-10-15 DIAGNOSIS — I2510 Atherosclerotic heart disease of native coronary artery without angina pectoris: Secondary | ICD-10-CM

## 2020-10-22 DIAGNOSIS — R051 Acute cough: Secondary | ICD-10-CM | POA: Diagnosis not present

## 2020-10-22 DIAGNOSIS — J40 Bronchitis, not specified as acute or chronic: Secondary | ICD-10-CM | POA: Diagnosis not present

## 2020-10-27 NOTE — Progress Notes (Signed)
FOLLOW UP  Assessment and Plan:   Aortic atherosclerosis ([place]) Per CT 2020 Control blood pressure, cholesterol, glucose, increase exercise.   Coronary artery disease involving native heart with angina pectoris, unspecified vessel or lesion type ([place]) Control blood pressure, cholesterol, glucose, increase exercise.  Continue cardio follow up Stop smoking  OSA and COPD overlap syndrome ([facility]) Wears mouthpiece, encouraged compliance, risks discussed, follow up by Dr. Toy Cookey if needed   Chronic obstructive pulmonary disease, unspecified COPD type ([place]) Continue inhalers, stop smoking, has recent CT lung 02/2020  Hypertension Well controlled with current medications  Monitor blood pressure at home; patient to call if consistently greater than 130/80 Continue DASH diet.   Reminder to go to the ER if any CP, SOB, nausea, dizziness, severe HA, changes vision/speech, left arm numbness and tingling and jaw pain.  Cholesterol Currently at goal; continue statin, discussed LDL goal <70 Continue low cholesterol diet and exercise.  Check lipid panel.   Diabetes with other circulatory complications Continue medication: metformin, glimepiride 2 mg BID; farxiga was discontinued due to mycotic infections He would prefer to avoid insulin; did well with ozempic but cost concern - will try trulicity with lily support  Perform daily foot/skin check, notify office of any concerning changes.  Check A1C  Overweight with co morbidities  Long discussion about weight loss, diet, and exercise Recommended diet heavy in fruits and veggies and low in animal meats, cheeses, and dairy products, appropriate calorie intake Discussed ideal weight for height and initial weight goal  Patient will work on increasing intentional exercise  Will follow up in 3 months  Vitamin D Def Continue supplement for goal 60-100;  Check Vit D level  Tobacco use Discussed risks associated with tobacco use and advised to  reduce or quit Patient is not ready to do so, but advised to consider strongly Will follow up at the next visit - CT lung ordered to schedule    Continue diet and meds as discussed. Further disposition pending results of labs. Discussed med's effects and SE's.   Over 30 minutes of exam, counseling, chart review, and critical decision making was performed.   Future Appointments  Date Time Provider [place]  11/25/2020 11:45 AM [name], NP [facility]  01/28/2021 10:30 AM [name], NP [facility] None  05/02/2021  9:00 AM [name], NP [facility] None    ----------------------------------------------------------------------------------------------------------------------  HPI 70 y.o. male  presents for 3 month follow up on CAD, hypertension, cholesterol, diabetes, CKD, smoking, weight and vitamin D deficiency.   He has hx of reported left arm pain and was seen by Dr. Johnsie Cancel with normal Lexiscan in 07/2019, reports had cortisone shot by Dr. Erlinda Hong last week and doing better.   He is a most day smoker, down to 0-2 cigarettes some days, down from 1 pack/day for 40 years and consequently with COPD; also OSA with CPAP mouth piece followed by Dr. Toy Cookey but admits he is not wearing daily citing some intermittent discomfort; he is prescribed BREO and has expressed significant benefit with this. He is getting annual CT lung screening, no nodules, last 02/2020.    He has also admitted to excess alcohol intake, 3-4 drinks nightly and has been advised to reduce this intake, down from 3-6.   OSA, intolerant of CPAP and had been using mouth piece but he feels didn't work well; managed by Dr. Toy Cookey, does still use, has been recommended repeat sleep study but patient adamantly declines repeat. He plans to try again with  mask, still has CPAP.   BMI is Body mass index is 27.3 kg/m., he has been working on diet, he is active at work, as a Water engineer.  Wt  Readings from Last 3 Encounters:  10/28/20 193 lb (87.5 kg)  10/13/20 189 lb (85.7 kg)  07/28/20 197 lb 3.2 oz (89.4 kg)   He has a history of CAD s/p PCI to [place]; He underwent LHC in2006 after nuc showed inf ischemia;He underwent LHC in 04/18/11 which showed a proximal LAD 60% (unchanged from previous catheterization), distal LAD 65% (small caliber), Dx 60%, proximal CFX 20%, mid CFX 50%, OM1 30%, proximal RCA 20%, mid RCA stent patent, EF 65%. Medical therapy recommended. Follows with cardiology - Dr. Johnsie Cancel and colleagues. Had recent repeat lexiscan in 07/2019 which was normal with EF 65%. He has aortic atherosclerosis per numerous CT scans.   His blood pressure has been controlled at home, today their BP is BP: 118/64  He does not workout. He denies chest pain, shortness of breath, dizziness.    He is on cholesterol medication (rosuvastatin 20 mg three days a week) and denies myalgias. His cholesterol is at goal. The cholesterol last visit was:   Lab Results  Component Value Date   CHOL 103 07/28/2020   HDL 37 (L) 07/28/2020   LDLCALC 43 07/28/2020   TRIG 147 07/28/2020   CHOLHDL 2.8 07/28/2020    He has been working on diet and exercise for T2DM (on metformin 2000 mg, glimepiride 4 mg BID, formerly on farxiga but stopped due to yeast infections, off of ozempic due to cost, interested in trulicy), and denies foot ulcerations, increased appetite, nausea, paresthesia of the feet, polydipsia, polyuria, visual disturbances, vomiting and weight loss. He does check sporadic fasting glucose, was 130s on ozempic, now around 150-180 off of ozempic. Last A1C in the office was:  Lab Results  Component Value Date   HGBA1C 7.2 (H) 04/23/2020   He has CKD II associated with T2DM monitored at this office:  Lab Results  Component Value Date   GFRNONAA 90 07/28/2020   Patient is on Vitamin D supplement.   Lab Results  Component Value Date   VD25OH 91 04/23/2020         Current  Medications:  Current Outpatient Medications on File Prior to Visit  Medication Sig  . acetaminophen (TYLENOL) 500 MG tablet Take 2 tablets (1,000 mg total) by mouth every 6 (six) hours. (Patient taking differently: Take 1,000 mg by mouth as needed.)  . aspirin EC 81 MG tablet Take 1 tablet (81 mg total) by mouth daily.  . benzonatate (TESSALON) 200 MG capsule Take  1 capsule  3 x /day  to Prevent Cough  . blood glucose meter kit and supplies Dispense based on patient and insurance preference. Use up to four times daily as directed. (FOR ICD-9 250.00, 250.01).  . blood glucose meter kit and supplies Dispense based on patient and insurance preference. Use up to four times daily as directed. (FOR ICD-10 E10.9, E11.9).  . cetirizine (ZYRTEC) 10 MG tablet Take 10 mg by mouth as needed for allergies.   . Cholecalciferol (VITAMIN D) 50 MCG (2000 UT) tablet Take 1 tablet (2,000 Units total) by mouth daily. (Patient taking differently: Take 10,000 Units by mouth daily.)  . doxazosin (CARDURA) 8 MG tablet Take 1 tablet   at Bedtime    for BP & Prostate  . fexofenadine (ALLEGRA) 60 MG tablet Take 60 mg by mouth daily as needed for  allergies or rhinitis.  . finasteride (PROSCAR) 5 MG tablet Take 1 tablet daily for prostate.  . fluticasone furoate-vilanterol (BREO ELLIPTA) 100-25 MCG/INH AEPB INHALE 1 PUFF BY MOUTH ONCE DAILY. RINSE MOUTH WITH WATER AFTER EACH USE.  [place] glimepiride (AMARYL) 4 MG tablet TAKE 1/2-1 TABLET BY MOUTH TWICE DAILY WITH MEALS FOR DIABETES  . glucose blood test strip Test blood sugars once daily.Dx: E11.9  . meloxicam (MOBIC) 15 MG tablet Take one daily with food for 2 weeks, can take with tylenol, can not take with aleve, iburpofen, then as needed daily for pain  . metFORMIN (GLUCOPHAGE) 1000 MG tablet Take  1 tablet  2 x /day  with Meals  for Diabetes  . ramipril (ALTACE) 5 MG capsule Take 1 capsule by mouth once daily  . rOPINIRole (REQUIP) 1 MG tablet Take  1 tablet  3 x /day  for  Restless Legs  . rosuvastatin (CRESTOR) 20 MG tablet Take 1 tab three nights a week for cholesterol.  . gabapentin (NEURONTIN) 100 MG capsule Take 1 to 3 capsules at Bedtime for Sleep & Restless Legs (Patient not taking: Reported on 10/28/2020)   No current facility-administered medications on file prior to visit.     Allergies: No Known Allergies   Medical History:  Past Medical History:  Diagnosis Date  . CAD (coronary artery disease)    a. s/p stent to RCA 2005; b. LHC 2006 after Nuc with inf ischemia: patent RCA stent, D1 80%, LAD 60% => med Rx;  c. LHC 04/18/11: Proximal LAD 60% (unchanged from previous catheterization), distal LAD 65% (small caliber), Dx 60%, proximal CFX 20%, mid CFX 50%, OM1 30%, proximal RCA 20%, mid RCA stent patent, EF 65%.=> med Rx  . GERD (gastroesophageal reflux disease)   . HLD (hyperlipidemia)   . Hypertension   . Obesity   . OSA (obstructive sleep apnea)    has a cpap-does not use-has a special mouthpiece he wears at night  . Type II or unspecified type diabetes mellitus without mention of complication, not stated as uncontrolled   . Vitamin D deficiency   . Wears glasses    Family history- Reviewed and unchanged Social history- Reviewed and unchanged   Review of Systems:  Review of Systems  Constitutional: Negative for malaise/fatigue and weight loss.  HENT: Negative for hearing loss and tinnitus.   Eyes: Negative for blurred vision and double vision.  Respiratory: Negative for cough, shortness of breath and wheezing.   Cardiovascular: Negative for chest pain, palpitations, orthopnea, claudication and leg swelling.  Gastrointestinal: Negative for abdominal pain, blood in stool, constipation, diarrhea, heartburn, melena, nausea and vomiting.  Genitourinary: Negative.   Musculoskeletal: Negative for joint pain and myalgias.  Skin: Negative for rash.  Neurological: Negative for dizziness, tingling, sensory change, weakness and headaches.   Endo/Heme/Allergies: Negative for polydipsia.  Psychiatric/Behavioral: Negative.   All other systems reviewed and are negative.   Physical Exam: BP 118/64   Pulse 91   Temp (!) 97.3 F (36.3 C)   Wt 193 lb (87.5 kg)   SpO2 93%   BMI 27.30 kg/m  Wt Readings from Last 3 Encounters:  10/28/20 193 lb (87.5 kg)  10/13/20 189 lb (85.7 kg)  07/28/20 197 lb 3.2 oz (89.4 kg)   General Appearance: Well nourished, in no apparent distress. Eyes: PERRLA, EOMs, conjunctiva no swelling or erythema Sinuses: No Frontal/maxillary tenderness ENT/Mouth: Ext aud canals clear, TMs without erythema, bulging. No erythema, swelling, or exudate on post pharynx.  Tonsils  not swollen or erythematous. Hearing normal.  Neck: Supple, thyroid normal.  Respiratory: Respiratory effort normal, BS equal bilaterally without rales, rhonchi, wheezing or stridor.  Cardio: RRR with no MRGs. Brisk peripheral pulses without edema.  Abdomen: Soft, + BS.  Non tender, no guarding, rebound, hernias, masses. Lymphatics: Non tender without lymphadenopathy.  Musculoskeletal: Full ROM, 5/5 strength, Normal gait Skin: Warm, dry without rashes, lesions, ecchymosis.  Neuro: Cranial nerves intact. No cerebellar symptoms.  Psych: Awake and oriented X 3, normal affect, Insight and Judgment appropriate.    Izora Ribas, NP 9:53 AM [facility]

## 2020-10-28 ENCOUNTER — Ambulatory Visit (INDEPENDENT_AMBULATORY_CARE_PROVIDER_SITE_OTHER): Payer: Medicare Other | Admitting: Adult Health

## 2020-10-28 ENCOUNTER — Other Ambulatory Visit: Payer: Self-pay

## 2020-10-28 ENCOUNTER — Encounter: Payer: Self-pay | Admitting: Adult Health

## 2020-10-28 VITALS — BP 118/64 | HR 91 | Temp 97.3°F | Wt 193.0 lb

## 2020-10-28 DIAGNOSIS — I2510 Atherosclerotic heart disease of native coronary artery without angina pectoris: Secondary | ICD-10-CM | POA: Diagnosis not present

## 2020-10-28 DIAGNOSIS — F17200 Nicotine dependence, unspecified, uncomplicated: Secondary | ICD-10-CM

## 2020-10-28 DIAGNOSIS — I10 Essential (primary) hypertension: Secondary | ICD-10-CM

## 2020-10-28 DIAGNOSIS — G4733 Obstructive sleep apnea (adult) (pediatric): Secondary | ICD-10-CM

## 2020-10-28 DIAGNOSIS — F1010 Alcohol abuse, uncomplicated: Secondary | ICD-10-CM

## 2020-10-28 DIAGNOSIS — E785 Hyperlipidemia, unspecified: Secondary | ICD-10-CM | POA: Diagnosis not present

## 2020-10-28 DIAGNOSIS — J449 Chronic obstructive pulmonary disease, unspecified: Secondary | ICD-10-CM | POA: Diagnosis not present

## 2020-10-28 DIAGNOSIS — Z86010 Personal history of colonic polyps: Secondary | ICD-10-CM

## 2020-10-28 DIAGNOSIS — E663 Overweight: Secondary | ICD-10-CM

## 2020-10-28 DIAGNOSIS — E1159 Type 2 diabetes mellitus with other circulatory complications: Secondary | ICD-10-CM | POA: Diagnosis not present

## 2020-10-28 DIAGNOSIS — I700 Atherosclerosis of aorta: Secondary | ICD-10-CM

## 2020-10-28 DIAGNOSIS — Z79899 Other long term (current) drug therapy: Secondary | ICD-10-CM

## 2020-10-28 DIAGNOSIS — E1169 Type 2 diabetes mellitus with other specified complication: Secondary | ICD-10-CM | POA: Diagnosis not present

## 2020-10-28 DIAGNOSIS — E559 Vitamin D deficiency, unspecified: Secondary | ICD-10-CM

## 2020-10-28 MED ORDER — TRULICITY 0.75 MG/0.5ML ~~LOC~~ SOAJ: 0.7500 mg | SUBCUTANEOUS | 0 refills | Status: DC

## 2020-10-28 NOTE — Patient Instructions (Signed)
Goals    . Exercise 150 minutes per week (moderate activity)    . fasting glucose <130    . HEMOGLOBIN A1C < 7    . LDL CALC < 70    . Quit smoking / using tobacco    . Reduce alcohol intake     Try to cut back to <2 drinks per day, avoid drinking daily     . Weight (lb) < 190 lb (86.2 kg)          Dulaglutide Injection What is this medicine? DULAGLUTIDE (DOO la GLOO tide) controls blood sugar in people with type 2 diabetes. It is used with lifestyle changes like diet and exercise. It may lower the risk of problems that need treatment in the hospital. These problems include heart attack or stroke. This medicine may be used for other purposes; ask your health care provider or pharmacist if you have questions. COMMON BRAND NAME(S): [name] What should I tell my health care provider before I take this medicine? They need to know if you have any of these conditions:  endocrine tumors (MEN 2) or if someone in your family had these tumors  eye disease, vision problems  history of pancreatitis  kidney disease  liver disease  stomach or intestine problems  thyroid cancer or if someone in your family had thyroid cancer  an unusual or allergic reaction to dulaglutide, other medicines, foods, dyes, or preservatives  pregnant or trying to get pregnant  breast-feeding How should I use this medicine? This medicine is injected under the skin. You will be taught how to prepare and give it. Take it as directed on the prescription label on the same day of each week. Do NOT prime the pen. Keep taking it unless your health care provider tells you to stop. If you use this medicine with insulin, you should inject this medicine and the insulin separately. Do not mix them together. Do not give the injections right next to each other. Change (rotate) injection sites with each injection. This drug comes with INSTRUCTIONS FOR USE. Ask your pharmacist for directions on how to use this medicine.  Read the information carefully. Talk to your pharmacist or health care provider if you have questions. It is important that you put your used needles and syringes in a special sharps container. Do not put them in a trash can. If you do not have a sharps container, call your pharmacist or health care provider to get one. A special MedGuide will be given to you by the pharmacist with each prescription and refill. Be sure to read this information carefully each time. Talk to your health care provider about the use of this medicine in children. Special care may be needed. Overdosage: If you think you have taken too much of this medicine contact a poison control center or emergency room at once. NOTE: This medicine is only for you. Do not share this medicine with others. What if I miss a dose? If you miss a dose, take it as soon as you can unless it is more than 3 days late. If it is more than 3 days late, skip the missed dose. Take the next dose at the normal time. What may interact with this medicine?  other medicines for diabetes Many medications may cause changes in blood sugar, these include:  alcohol containing beverages  antiviral medicines for HIV or AIDS  aspirin and aspirin-like drugs  certain medicines for blood pressure, heart disease, irregular heart beat  chromium  diuretics  male hormones, such as estrogens or progestins, birth control pills  fenofibrate  gemfibrozil  isoniazid  lanreotide  male hormones or anabolic steroids  MAOIs like Carbex, Eldepryl, Marplan, Nardil, and Parnate  medicines for allergies, asthma, cold, or cough  medicines for depression, anxiety, or psychotic disturbances  medicines for weight loss  niacin  nicotine  NSAIDs, medicines for pain and inflammation, like ibuprofen or naproxen  octreotide  pasireotide  pentamidine  phenytoin  probenecid  quinolone antibiotics such as ciprofloxacin, levofloxacin, ofloxacin  some  herbal dietary supplements  steroid medicines such as prednisone or cortisone  sulfamethoxazole; trimethoprim  thyroid hormones Some medications can hide the warning symptoms of low blood sugar (hypoglycemia). You may need to monitor your blood sugar more closely if you are taking one of these medications. These include:  beta-blockers, often used for high blood pressure or heart problems (examples include atenolol, metoprolol, propranolol)  clonidine  guanethidine  reserpine This list may not describe all possible interactions. Give your health care provider a list of all the medicines, herbs, non-prescription drugs, or dietary supplements you use. Also tell them if you smoke, drink alcohol, or use illegal drugs. Some items may interact with your medicine. What should I watch for while using this medicine? Visit your health care provider for regular checks on your progress. Check with your health care provider if you have severe diarrhea, nausea, and vomiting, or if you sweat a lot. The loss of too much body fluid may make it dangerous for you to take this medicine. A test called the HbA1C (A1C) will be monitored. This is a simple blood test. It measures your blood sugar control over the last 2 to 3 months. You will receive this test every 3 to 6 months. Learn how to check your blood sugar. Learn the symptoms of low and high blood sugar and how to manage them. Always carry a quick-source of sugar with you in case you have symptoms of low blood sugar. Examples include hard sugar candy or glucose tablets. Make sure others know that you can choke if you eat or drink when you develop serious symptoms of low blood sugar, such as seizures or unconsciousness. Get medical help at once. Tell your health care provider if you have high blood sugar. You might need to change the dose of your medicine. If you are sick or exercising more than usual, you may need to change the dose of your medicine. Do not  skip meals. Ask your health care provider if you should avoid alcohol. Many nonprescription cough and cold products contain sugar or alcohol. These can affect blood sugar. Pens should never be shared. Even if the needle is changed, sharing may result in passing of viruses like hepatitis or HIV. Wear a medical ID bracelet or chain. Carry a card that describes your condition. List the medicines and doses you take on the card. What side effects may I notice from receiving this medicine? Side effects that you should report to your doctor or health care professional as soon as possible:  allergic reactions (skin rash, itching or hives; swelling of the face, lips, or tongue)  changes in vision  diarrhea that continues or is severe  infection (fever, chills, cough, sore throat, pain or trouble passing urine)  kidney injury (trouble passing urine or change in the amount of urine)  low blood sugar (feeling anxious; confusion; dizziness; increased hunger; unusually weak or tired; increased sweating; shakiness; cold, clammy skin; irritable; headache; blurred vision;  fast heartbeat; loss of consciousness)  lump or swelling on the neck  trouble breathing  trouble swallowing  unusual stomach upset or pain  vomiting Side effects that usually do not require medical attention (report to your doctor or health care professional if they continue or are bothersome):  lack or loss of appetite  nausea  pain, redness, or irritation at site where injected This list may not describe all possible side effects. Call your doctor for medical advice about side effects. You may report side effects to FDA at 1-800-FDA-1088. Where should I keep my medicine? Keep out of the reach of children and pets. Refrigeration (preferred): Store unopened pens in a refrigerator between 2 and 8 degrees C (36 and 46 degrees F). Keep it in the original carton until you are ready to take it. Do not freeze or use if the medicine  has been frozen. Protect from light. Get rid of any unused medicine after the expiration date on the label. Room Temperature: The pen may be stored at room temperature below 30 degrees C (86 degrees F) for up to a total of 14 days if needed. Protect from light. Avoid exposure to extreme heat. If it is stored at room temperature, throw away any unused medicine after 14 days or after it expires, whichever is first. To get rid of medicines that are no longer needed or have expired:  Take the medicine to a medicine take-back program. Check with your pharmacy or law enforcement to find a location.  If you cannot return the medicine, ask your pharmacist or health care provider how to get rid of this medicine safely. NOTE: This sheet is a summary. It may not cover all possible information. If you have questions about this medicine, talk to your doctor, pharmacist, or health care provider.  2021 Elsevier/Gold Standard (2020-05-31 07:35:51)      High-Fiber Eating Plan Fiber, also called dietary fiber, is a type of carbohydrate. It is found foods such as fruits, vegetables, whole grains, and beans. A high-fiber diet can have many health benefits. Your health care provider may recommend a high-fiber diet to help:  Prevent constipation. Fiber can make your bowel movements more regular.  Lower your cholesterol.  Relieve the following conditions: ? Inflammation of veins in the anus (hemorrhoids). ? Inflammation of specific areas of the digestive tract (uncomplicated diverticulosis). ? A problem of the large intestine, also called the colon, that sometimes causes pain and diarrhea (irritable bowel syndrome, or IBS).  Prevent overeating as part of a weight-loss plan.  Prevent heart disease, type 2 diabetes, and certain cancers. What are tips for following this plan? Reading food labels  Check the nutrition facts label on food products for the amount of dietary fiber. Choose foods that have 5 grams  of fiber or more per serving.  The goals for recommended daily fiber intake include: ? Men (age 36 or younger): 34-38 g. ? Men (over age 26): 28-34 g. ? Women (age 68 or younger): 25-28 g. ? Women (over age 39): 22-25 g. Your daily fiber goal is _____________ g.   Shopping  Choose whole fruits and vegetables instead of processed forms, such as apple juice or applesauce.  Choose a wide variety of high-fiber foods such as avocados, lentils, oats, and kidney beans.  Read the nutrition facts label of the foods you choose. Be aware of foods with added fiber. These foods often have high sugar and sodium amounts per serving. Cooking  Use whole-grain flour for baking and  cooking.  Cook with brown rice instead of white rice. Meal planning  Start the day with a breakfast that is high in fiber, such as a cereal that contains 5 g of fiber or more per serving.  Eat breads and cereals that are made with whole-grain flour instead of refined flour or white flour.  Eat brown rice, bulgur wheat, or millet instead of white rice.  Use beans in place of meat in soups, salads, and pasta dishes.  Be sure that half of the grains you eat each day are whole grains. General information  You can get the recommended daily intake of dietary fiber by: ? Eating a variety of fruits, vegetables, grains, nuts, and beans. ? Taking a fiber supplement if you are not able to take in enough fiber in your diet. It is better to get fiber through food than from a supplement.  Gradually increase how much fiber you consume. If you increase your intake of dietary fiber too quickly, you may have bloating, cramping, or gas.  Drink plenty of water to help you digest fiber.  Choose high-fiber snacks, such as berries, raw vegetables, nuts, and popcorn. What foods should I eat? Fruits Berries. Pears. Apples. Oranges. Avocado. Prunes and raisins. Dried figs. Vegetables Sweet potatoes. Spinach. Kale. Artichokes. Cabbage.  Broccoli. Cauliflower. Green peas. Carrots. Squash. Grains Whole-grain breads. Multigrain cereal. Oats and oatmeal. Brown rice. Barley. Bulgur wheat. [place]. Quinoa. Bran muffins. Popcorn. Rye wafer crackers. Meats and other proteins Navy beans, kidney beans, and pinto beans. Soybeans. Split peas. Lentils. Nuts and seeds. Dairy Fiber-fortified yogurt. Beverages Fiber-fortified soy milk. Fiber-fortified orange juice. Other foods Fiber bars. The items listed above may not be a complete list of recommended foods and beverages. Contact a dietitian for more information. What foods should I avoid? Fruits Fruit juice. Cooked, strained fruit. Vegetables Fried potatoes. Canned vegetables. Well-cooked vegetables. Grains White bread. Pasta made with refined flour. White rice. Meats and other proteins Fatty cuts of meat. Fried chicken or fried fish. Dairy Milk. Yogurt. Cream cheese. Sour cream. Fats and oils Butters. Beverages Soft drinks. Other foods Cakes and pastries. The items listed above may not be a complete list of foods and beverages to avoid. Talk with your dietitian about what choices are best for you. Summary  Fiber is a type of carbohydrate. It is found in foods such as fruits, vegetables, whole grains, and beans.  A high-fiber diet has many benefits. It can help to prevent constipation, lower blood cholesterol, aid weight loss, and reduce your risk of heart disease, diabetes, and certain cancers.  Increase your intake of fiber gradually. Increasing fiber too quickly may cause cramping, bloating, and gas. Drink plenty of water while you increase the amount of fiber you consume.  The best sources of fiber include whole fruits and vegetables, whole grains, nuts, seeds, and beans. This information is not intended to replace advice given to you by your health care provider. Make sure you discuss any questions you have with your health care provider. Document Revised: 12/04/2019  Document Reviewed: 12/04/2019 Elsevier Patient Education  2021 [employer].

## 2020-10-29 LAB — HEMOGLOBIN A1C
Hgb A1c MFr Bld: 9.1 % of total Hgb — ABNORMAL HIGH (ref <5.7)
Mean Plasma Glucose: 214 mg/dL
eAG (mmol/L): 11.9 mmol/L

## 2020-10-29 LAB — CBC WITH DIFFERENTIAL/PLATELET
Absolute Monocytes: 662 cells/uL (ref 200–950)
Basophils Absolute: 53 cells/uL (ref 0–200)
Basophils Relative: 0.5 %
Eosinophils Absolute: 420 cells/uL (ref 15–500)
Eosinophils Relative: 4 %
HCT: 43.2 % (ref 38.5–50.0)
Hemoglobin: 14.5 g/dL (ref 13.2–17.1)
Lymphs Abs: 2111 cells/uL (ref 850–3900)
MCH: 31.6 pg (ref 27.0–33.0)
MCHC: 33.6 g/dL (ref 32.0–36.0)
MCV: 94.1 fL (ref 80.0–100.0)
MPV: 12 fL (ref 7.5–12.5)
Monocytes Relative: 6.3 %
Neutro Abs: 7256 cells/uL (ref 1500–7800)
Neutrophils Relative %: 69.1 %
Platelets: 208 10*3/uL (ref 140–400)
RBC: 4.59 10*6/uL (ref 4.20–5.80)
RDW: 11.5 % (ref 11.0–15.0)
Total Lymphocyte: 20.1 %
WBC: 10.5 10*3/uL (ref 3.8–10.8)

## 2020-10-29 LAB — COMPLETE METABOLIC PANEL WITH GFR
AG Ratio: 1.5 (calc) (ref 1.0–2.5)
ALT: 26 U/L (ref 9–46)
AST: 15 U/L (ref 10–35)
Albumin: 3.8 g/dL (ref 3.6–5.1)
Alkaline phosphatase (APISO): 79 U/L (ref 35–144)
BUN/Creatinine Ratio: 15 (calc) (ref 6–22)
BUN: 10 mg/dL (ref 7–25)
CO2: 30 mmol/L (ref 20–32)
Calcium: 9.3 mg/dL (ref 8.6–10.3)
Chloride: 100 mmol/L (ref 98–110)
Creat: 0.68 mg/dL — ABNORMAL LOW (ref 0.70–1.25)
GFR, Est African American: 113 mL/min/{1.73_m2} (ref >=60)
GFR, Est Non African American: 97 mL/min/{1.73_m2} (ref >=60)
Globulin: 2.5 g/dL (calc) (ref 1.9–3.7)
Glucose, Bld: 229 mg/dL — ABNORMAL HIGH (ref 65–99)
Potassium: 4.8 mmol/L (ref 3.5–5.3)
Sodium: 137 mmol/L (ref 135–146)
Total Bilirubin: 0.4 mg/dL (ref 0.2–1.2)
Total Protein: 6.3 g/dL (ref 6.1–8.1)

## 2020-10-29 LAB — LIPID PANEL
Cholesterol: 106 mg/dL (ref <200)
HDL: 35 mg/dL — ABNORMAL LOW (ref >=40)
LDL Cholesterol (Calc): 55 mg/dL (calc)
Non-HDL Cholesterol (Calc): 71 mg/dL (calc) (ref <130)
Total CHOL/HDL Ratio: 3 (calc) (ref <5.0)
Triglycerides: 82 mg/dL (ref <150)

## 2020-10-29 LAB — TSH: TSH: 1.1 mIU/L (ref 0.40–4.50)

## 2020-10-29 LAB — VITAMIN D 25 HYDROXY (VIT D DEFICIENCY, FRACTURES): Vit D, 25-Hydroxy: 91 ng/mL (ref 30–100)

## 2020-10-29 LAB — MAGNESIUM: Magnesium: 1.8 mg/dL (ref 1.5–2.5)

## 2020-11-15 ENCOUNTER — Telehealth: Payer: Self-pay

## 2020-11-15 NOTE — Telephone Encounter (Signed)
Patient meets program eligibility requirements and is enrolled in [facility] for a 12 month period. ([name])

## 2020-11-24 NOTE — Progress Notes (Signed)
Cardiology Office Note   Date:  11/25/2020   ID:  Joe Green, DOB 21-Apr-1951, MRN [id number]  PCP:  Joe Pinto, MD  Cardiologist: Dr. Johnsie Green    No chief complaint on file.     History of Present Illness: Joe Green is a 70 y.o. male who presents for arm pain.    Last visit 07/15/19 COPD , smoking, DM2, HTN, HLD referred by primary for left arm pain / numbness ? Anginal equivalent I have not seen him in 5 years He had stent to RCA in 2005 Chronic RBBB Last cath 2006 with residual D1 and 60% LAD disease Similar moderate LAD/D1 disease and patent RCA stent by cath in September 2012   Fell and fractured ankle and some left sided ribs earlier this year Has been having left arm numbness for a few months Seems more muscular from shoulder down Not like his previous angina before stent when he had heaviness/tightness in chest Pain in shoulder And neck sometimes   Last nuc 07/24/19 normal nuc.   Called for arm numbness saw PCP and planned antiinflammatory. Today his arm pain and numbness is better. No chest pain.  He tells me this is routine follow up and for arm pain.  He still has some numbness in finger tips.  He worked as a Building control surveyor, may be some carpal tunnel.  With his MI he remembers chest pain.  No DOE, no waking in night with pain, no palpitations no rapid heart rate. Last A1c was 9 from PCP, placed on trulicity and daily AM glucose is improved. He is trying to eat healthier, discused DASH diet and exercise.  He has retired and wants to begin exercise.    Past Medical History:  Diagnosis Date  . CAD (coronary artery disease)    a. s/p stent to RCA 2005; b. LHC 2006 after Nuc with inf ischemia: patent RCA stent, D1 80%, LAD 60% => med Rx;  c. LHC 04/18/11: Proximal LAD 60% (unchanged from previous catheterization), distal LAD 65% (small caliber), Dx 60%, proximal CFX 20%, mid CFX 50%, OM1 30%, proximal RCA 20%, mid RCA stent patent, EF 65%.=> med Rx  . GERD  (gastroesophageal reflux disease)   . HLD (hyperlipidemia)   . Hypertension   . Obesity   . OSA (obstructive sleep apnea)    has a cpap-does not use-has a special mouthpiece he wears at night  . Type II or unspecified type diabetes mellitus without mention of complication, not stated as uncontrolled   . Vitamin D deficiency   . Wears glasses     Past Surgical History:  Procedure Laterality Date  . CARDIAC CATHETERIZATION  2005   stent  . CHONDROPLASTY  06/25/2017   Procedure: CHONDROPLASTY;  Surgeon: Joe Leitz, MD;  Location: [facility];  Service: Orthopedics;;  . COLONOSCOPY  11/2011   Due 5 years Dr. Fuller Green  . heart stent  2005   RCA  . INGUINAL HERNIA REPAIR Right 11/16/2014   Procedure: RIGHT INGUINAL HERNIA REPAIR WITH MESH;  Surgeon: Joe Gemma, MD;  Location: [facility];  Service: General;  Laterality: Right;  . INSERTION OF MESH Right 11/16/2014   Procedure: INSERTION OF MESH;  Surgeon: Joe Gemma, MD;  Location: [facility];  Service: General;  Laterality: Right;  . KNEE ARTHROSCOPY WITH MEDIAL MENISECTOMY Bilateral 06/25/2017   Procedure: right knee arthroscopy with medial meniscectomy, chondroplasty, Left knee arthroscopy with medial menisectomy removal of plica and chondroplasty;  Surgeon:  Joe Leitz, MD;  Location: [facility];  Service: Orthopedics;  Laterality: Bilateral;  . POLYPECTOMY       Current Outpatient Medications  Medication Sig Dispense Refill  . acetaminophen (TYLENOL) 500 MG tablet Take 2 tablets (1,000 mg total) by mouth every 6 (six) hours. 30 tablet 0  . aspirin EC 81 MG tablet Take 1 tablet (81 mg total) by mouth daily. 90 tablet 3  . benzonatate (TESSALON) 200 MG capsule Take  1 capsule  3 x /day  to Prevent Cough 90 capsule 0  . blood glucose meter kit and supplies Dispense based on patient and insurance preference. Use up to four times daily as directed. (FOR ICD-9 250.00, 250.01). 1  each 0  . blood glucose meter kit and supplies Dispense based on patient and insurance preference. Use up to four times daily as directed. (FOR ICD-10 E10.9, E11.9). 1 each 0  . cetirizine (ZYRTEC) 10 MG tablet Take 10 mg by mouth as needed for allergies.     . Cholecalciferol (VITAMIN D) 50 MCG (2000 UT) tablet Take 1 tablet (2,000 Units total) by mouth daily.    Marland Kitchen doxazosin (CARDURA) 8 MG tablet Take 1 tablet   at Bedtime    for BP & Prostate 90 tablet 0  . Dulaglutide ([name]) 1.02 VO/5.3GU SOPN Inject 0.75 mg into the skin once a week. 2 mL 0  . fexofenadine (ALLEGRA) 60 MG tablet Take 60 mg by mouth daily as needed for allergies or rhinitis.    . finasteride (PROSCAR) 5 MG tablet Take 1 tablet daily for prostate. 90 tablet 1  . fluticasone furoate-vilanterol (BREO ELLIPTA) 100-25 MCG/INH AEPB INHALE 1 PUFF BY MOUTH ONCE DAILY. RINSE MOUTH WITH WATER AFTER EACH USE. 60 each 5  . glimepiride (AMARYL) 4 MG tablet TAKE 1/2-1 TABLET BY MOUTH TWICE DAILY WITH MEALS FOR DIABETES 180 tablet 1  . glucose blood test strip Test blood sugars once daily.Dx: E11.9 100 each 2  . metFORMIN (GLUCOPHAGE) 1000 MG tablet Take  1 tablet  2 x /day  with Meals  for Diabetes 180 tablet 0  . ramipril (ALTACE) 5 MG capsule Take 1 capsule by mouth once daily 90 capsule 0  . rOPINIRole (REQUIP) 1 MG tablet Take  1 tablet  3 x /day  for Restless Legs 270 tablet 0  . rosuvastatin (CRESTOR) 20 MG tablet Take 1 tab three nights a week for cholesterol. 90 tablet 1   No current facility-administered medications for this visit.    Allergies:   Patient has no known allergies.    Social History:  The patient  reports that he has quit smoking. His smoking use included cigarettes. He started smoking about 52 years ago. He has a 51.00 pack-year smoking history. He has never used smokeless tobacco. He reports current alcohol use of about 28.0 standard drinks of alcohol per week. He reports that he does not use drugs.    Family History:  The patient's family history includes Cancer in his mother; Hypertension in his father; Kidney cancer in his maternal grandmother; Prostate cancer in his father.    ROS:  General:no colds or fevers, no weight changes Skin:no rashes or ulcers HEENT:no blurred vision, no congestion CV:see HPI PUL:see HPI GI:no diarrhea constipation or melena, no indigestion GU:no hematuria, no dysuria MS:no joint pain, no claudication Neuro:no syncope, no lightheadedness Endo:+ diabetes, no thyroid disease  Wt Readings from Last 3 Encounters:  11/25/20 192 lb 6.4 oz (87.3 kg)  10/28/20 193  lb (87.5 kg)  10/13/20 189 lb (85.7 kg)     PHYSICAL EXAM: VS:  BP 112/64   Pulse 90   Ht _0  (1.803 m)   Wt 192 lb 6.4 oz (87.3 kg)   SpO2 96%   BMI 26.83 kg/m  , BMI Body mass index is 26.83 kg/m. General:Pleasant affect, NAD Skin:Warm and dry, brisk capillary refill HEENT:normocephalic, sclera clear, mucus membranes moist Neck:supple, no JVD, no bruits  Heart:S1S2 RRR without murmur, gallup, rub or click Lungs:clear without rales, rhonchi, or wheezes MLJ:QGBE, non tender, + BS, do not palpate liver spleen or masses Ext:no lower ext edema, 2+ pedal pulses, 2+ radial pulses Neuro:alert and oriented X 3, MAE, follows commands, + facial symmetry    EKG:  EKG is ordered today. The ekg ordered today demonstrates SR with RBBB, no acute changes.     Recent Labs: 10/28/2020: ALT 26; BUN 10; Creat 0.68; Hemoglobin 14.5; Magnesium 1.8; Platelets 208; Potassium 4.8; Sodium 137; TSH 1.10    Lipid Panel    Component Value Date/Time   CHOL 106 10/28/2020 1017   TRIG 82 10/28/2020 1017   HDL 35 (L) 10/28/2020 1017   CHOLHDL 3.0 10/28/2020 1017   VLDL 17 03/02/2017 1115   LDLCALC 55 10/28/2020 1017       Other studies Reviewed: Additional studies/ records that were reviewed today include: . 07/24/19 nuc study  Nuclear stress EF: 65%. The left ventricular ejection fraction is  normal (55-65%).  There was no ST segment deviation noted during stress.  This is a low risk study. There is no evidence of previous myocardial infarction or evidence of ischemia.  The study is normal.     ASSESSMENT AND Green:  1.  CAD with hx stent to RCA in 2005, residual disease in 2006 ands 2012 by cath, last nuc 2020 no ischemia. He is aware to call if further problems unless severe then ER.  Otherwise if pain persists to do lexiscan myoview.  He will cal if needed. For now eat healthy and exercise.  2. HLD on statin and last [place] March this year by PCP 55. Continue statin goal LDL <70.   3.  HN controlled continue meds  4. DM -2 per PCP with elevated E1E  tulicity added and glucose is lower.    Current medicines are reviewed with the patient today.  The patient Has no concerns regarding medicines.  The following changes have been made:  See above Labs/ tests ordered today include:see above  Disposition:   FU:  see above  Signed, Cecilie Kicks, NP  11/25/2020 12:35 PM    [facility] [place], [place], [place] Phone: [phone]; Fax: [phone]

## 2020-11-25 ENCOUNTER — Other Ambulatory Visit: Payer: Self-pay

## 2020-11-25 ENCOUNTER — Encounter: Payer: Self-pay | Admitting: Cardiology

## 2020-11-25 ENCOUNTER — Ambulatory Visit: Payer: Medicare Other | Admitting: Cardiology

## 2020-11-25 VITALS — BP 112/64 | HR 90 | Ht 71.0 in | Wt 192.4 lb

## 2020-11-25 DIAGNOSIS — I10 Essential (primary) hypertension: Secondary | ICD-10-CM | POA: Diagnosis not present

## 2020-11-25 DIAGNOSIS — I2510 Atherosclerotic heart disease of native coronary artery without angina pectoris: Secondary | ICD-10-CM | POA: Diagnosis not present

## 2020-11-25 DIAGNOSIS — E1159 Type 2 diabetes mellitus with other circulatory complications: Secondary | ICD-10-CM

## 2020-11-25 DIAGNOSIS — E782 Mixed hyperlipidemia: Secondary | ICD-10-CM

## 2020-11-25 NOTE — Patient Instructions (Signed)
Medication Instructions:  Your physician recommends that you continue on your current medications as directed. Please refer to the Current Medication list given to you today.  *If you need a refill on your cardiac medications before your next appointment, please call your pharmacy*   Lab Work: None ordered  If you have labs (blood work) drawn today and your tests are completely normal, you will receive your results only by: Marland Kitchen MyChart Message (if you have MyChart) OR . A paper copy in the mail If you have any lab test that is abnormal or we need to change your treatment, we will call you to review the results.   Testing/Procedures: None ordered   Follow-Up: At [facility], you and your health needs are our priority.  As part of our continuing mission to provide you with exceptional heart care, we have created designated Provider Care Teams.  These Care Teams include your primary Cardiologist (physician) and Advanced Practice Providers (APPs -  Physician Assistants and Nurse Practitioners) who all work together to provide you with the care you need, when you need it.  We recommend signing up for the patient portal called "MyChart".  Sign up information is provided on this After Visit Summary.  MyChart is used to connect with patients for Virtual Visits (Telemedicine).  Patients are able to view lab/test results, encounter notes, upcoming appointments, etc.  Non-urgent messages can be sent to your provider as well.   To learn more about what you can do with MyChart, go to [email or web address].    Your next appointment:   6 month(s)  The format for your next appointment:   In Person  Provider:   You may see Jenkins Rouge, MD or one of the following Advanced Practice Providers on your designated Care Team:    Kathyrn Drown, NP    Other Instructions

## 2020-11-29 DIAGNOSIS — R351 Nocturia: Secondary | ICD-10-CM | POA: Diagnosis not present

## 2020-11-29 DIAGNOSIS — R350 Frequency of micturition: Secondary | ICD-10-CM | POA: Diagnosis not present

## 2020-11-29 DIAGNOSIS — N401 Enlarged prostate with lower urinary tract symptoms: Secondary | ICD-10-CM | POA: Diagnosis not present

## 2020-12-29 ENCOUNTER — Other Ambulatory Visit: Payer: Self-pay | Admitting: Internal Medicine

## 2020-12-29 DIAGNOSIS — H524 Presbyopia: Secondary | ICD-10-CM | POA: Diagnosis not present

## 2020-12-29 DIAGNOSIS — N320 Bladder-neck obstruction: Secondary | ICD-10-CM

## 2021-01-07 ENCOUNTER — Ambulatory Visit (INDEPENDENT_AMBULATORY_CARE_PROVIDER_SITE_OTHER): Payer: Medicare Other | Admitting: Adult Health

## 2021-01-07 ENCOUNTER — Other Ambulatory Visit: Payer: Self-pay

## 2021-01-07 ENCOUNTER — Encounter: Payer: Self-pay | Admitting: Adult Health

## 2021-01-07 VITALS — BP 130/76 | HR 84 | Temp 96.4°F | Wt 194.0 lb

## 2021-01-07 DIAGNOSIS — B029 Zoster without complications: Secondary | ICD-10-CM

## 2021-01-07 MED ORDER — PREDNISONE 20 MG PO TABS: ORAL_TABLET | ORAL | 0 refills | Status: DC

## 2021-01-07 MED ORDER — BENZONATATE 200 MG PO CAPS: ORAL_CAPSULE | ORAL | 0 refills | Status: DC

## 2021-01-07 MED ORDER — VALACYCLOVIR HCL 1 G PO TABS: 1000.0000 mg | ORAL_TABLET | Freq: Three times a day (TID) | ORAL | 0 refills | Status: DC

## 2021-01-07 NOTE — Patient Instructions (Signed)
Check wth insurance about shingrix coverage - 2 shots 2-6 months apart, wait 1-3 months after shingles resolved     Shingles  Shingles is an infection. It gives you a painful skin rash and blisters that have fluid in them. Shingles is caused by the same germ (virus) that causes chickenpox. Shingles only happens in people who:  Have had chickenpox.  Have been given a shot of medicine (vaccine) to protect against chickenpox. Shingles is rare in this group. The first symptoms of shingles may be itching, tingling, or pain in an area on your skin. A rash will show on your skin a few days or weeks later. The rash is likely to be on one side of your body. The rash usually has a shape like a belt or a band. Over time, the rash turns into fluid-filled blisters. The blisters will break open, change into scabs, and dry up. Medicines may:  Help with pain and itching.  Help you get better sooner.  Help to prevent long-term problems. Follow these instructions at home: Medicines  Take over-the-counter and prescription medicines only as told by your doctor.  Put on an anti-itch cream or numbing cream where you have a rash, blisters, or scabs. Do this as told by your doctor. Helping with itching and discomfort  Put cold, wet cloths (cold compresses) on the area of the rash or blisters as told by your doctor.  Cool baths can help you feel better. Try adding baking soda or dry oatmeal to the water to lessen itching. Do not bathe in hot water.   Blister and rash care  Keep your rash covered with a loose bandage (dressing).  Wear loose clothing that does not rub on your rash.  Keep your rash and blisters clean. To do this, wash the area with mild soap and cool water as told by your doctor.  Check your rash every day for signs of infection. Check for: ? More redness, swelling, or pain. ? Fluid or blood. ? Warmth. ? Pus or a bad smell.  Do not scratch your rash. Do not pick at your blisters.  To help you to not scratch: ? Keep your fingernails clean and cut short. ? Wear gloves or mittens when you sleep, if scratching is a problem. General instructions  Rest as told by your doctor.  Keep all follow-up visits as told by your doctor. This is important.  Wash your hands often with soap and water. If soap and water are not available, use hand sanitizer. Doing this lowers your chance of getting a skin infection caused by germs (bacteria).  Your infection can cause chickenpox in people who have never had chickenpox or never got a shot of chickenpox vaccine. If you have blisters that did not change into scabs yet, try not to touch other people or be around other people, especially: ? Babies. ? Pregnant women. ? Children who have areas of red, itchy, or rough skin (eczema). ? Very old people who have transplants. ? People who have a long-term (chronic) sickness, like cancer or AIDS. Contact a doctor if:  Your pain does not get better with medicine.  Your pain does not get better after the rash heals.  You have any signs of infection in the rash area. These signs include: ? More redness, swelling, or pain around the rash. ? Fluid or blood coming from the rash. ? The rash area feeling warm to the touch. ? Pus or a bad smell coming from the rash. Get  help right away if:  The rash is on your face or nose.  You have pain in your face or pain by your eye.  You lose feeling on one side of your face.  You have trouble seeing.  You have ear pain, or you have ringing in your ear.  You have a loss of taste.  Your condition gets worse. Summary  Shingles gives you a painful skin rash and blisters that have fluid in them.  Shingles is an infection. It is caused by the same germ (virus) that causes chickenpox.  Keep your rash covered with a loose bandage (dressing). Wear loose clothing that does not rub on your rash.  If you have blisters that did not change into scabs yet,  try not to touch other people or be around people. This information is not intended to replace advice given to you by your health care provider. Make sure you discuss any questions you have with your health care provider. Document Revised: 11/22/2018 Document Reviewed: 04/04/2017 Elsevier Patient Education  2021 [place].        Valacyclovir caplets What is this medicine? VALACYCLOVIR (val ay SYE kloe veer) is an antiviral medicine. It is used to treat or prevent infections caused by certain kinds of viruses. Examples of these infections include herpes and shingles. This medicine will not cure herpes. This medicine may be used for other purposes; ask your health care provider or pharmacist if you have questions. COMMON BRAND NAME(S): Valtrex What should I tell my health care provider before I take this medicine? They need to know if you have any of these conditions:  acquired immunodeficiency syndrome (AIDS)  any other condition that may weaken the immune system  bone marrow or kidney transplant  kidney disease  an unusual or allergic reaction to valacyclovir, acyclovir, ganciclovir, valganciclovir, other medicines, foods, dyes, or preservatives  pregnant or trying to get pregnant  breast-feeding How should I use this medicine? Take this medicine by mouth with a glass of water. Follow the directions on the prescription label. You can take this medicine with or without food. Take your doses at regular intervals. Do not take your medicine more often than directed. Finish the full course prescribed by your doctor or health care professional even if you think your condition is better. Do not stop taking except on the advice of your doctor or health care professional. Talk to your pediatrician regarding the use of this medicine in children. While this drug may be prescribed for children as young as 2 years for selected conditions, precautions do apply. Overdosage: If you think you  have taken too much of this medicine contact a poison control center or emergency room at once. NOTE: This medicine is only for you. Do not share this medicine with others. What if I miss a dose? If you miss a dose, take it as soon as you can. If it is almost time for your next dose, take only that dose. Do not take double or extra doses. What may interact with this medicine? Do not take this medicine with any of the following medications:  cidofovir This medicine may also interact with the following medications:  adefovir  amphotericin B  certain antibiotics like amikacin, gentamicin, tobramycin, vancomycin  cimetidine  cisplatin  colistin  cyclosporine  foscarnet  lithium  methotrexate  probenecid  tacrolimus This list may not describe all possible interactions. Give your health care provider a list of all the medicines, herbs, non-prescription drugs, or dietary  supplements you use. Also tell them if you smoke, drink alcohol, or use illegal drugs. Some items may interact with your medicine. What should I watch for while using this medicine? Tell your doctor or health care professional if your symptoms do not start to get better after 1 week. This medicine works best when taken early in the course of an infection, within the first 68 hours. Begin treatment as soon as possible after the first signs of infection like tingling, itching, or pain in the affected area. It is possible that genital herpes may still be spread even when you are not having symptoms. Always use safer sex practices like condoms made of latex or polyurethane whenever you have sexual contact. You should stay well hydrated while taking this medicine. Drink plenty of fluids. What side effects may I notice from receiving this medicine? Side effects that you should report to your doctor or health care professional as soon as possible:  allergic reactions like skin rash, itching or hives, swelling of the face,  lips, or tongue  aggressive behavior  confusion  hallucinations  problems with balance, talking, walking  stomach pain  tremor  trouble passing urine or change in the amount of urine Side effects that usually do not require medical attention (report to your doctor or health care professional if they continue or are bothersome):  dizziness  headache  nausea, vomiting This list may not describe all possible side effects. Call your doctor for medical advice about side effects. You may report side effects to FDA at 1-800-FDA-1088. Where should I keep my medicine? Keep out of the reach of children. Store at room temperature between 15 and 25 degrees C (59 and 77 degrees F). Keep container tightly closed. Throw away any unused medicine after the expiration date. NOTE: This sheet is a summary. It may not cover all possible information. If you have questions about this medicine, talk to your doctor, pharmacist, or health care provider.  2021 Elsevier/Gold Standard (2018-08-27 12:22:33)

## 2021-01-07 NOTE — Progress Notes (Signed)
Assessment and Plan:  Joe Green was seen today for acute visit.  Diagnoses and all orders for this visit:  Herpes zoster without complication Herpes Zoster- valcyclovir, Prednisone. The patient was informed that the vesicles can cause chicken pox in a person that is not immune, will follow up PRN. Discussed shingrix vaccine, check with insurance.  -     valACYclovir (VALTREX) 1000 MG tablet; Take 1 tablet (1,000 mg total) by mouth 3 (three) times daily. Take for 7 days -     predniSONE (DELTASONE) 20 MG tablet; 2 tablets daily for 3 days, 1 tablet daily for 4 days.  Other orders -     benzonatate (TESSALON) 200 MG capsule; Take 1 capsule  3 x /day PRN to Prevent Cough   Further disposition pending results of labs. Discussed med's effects and SE's.   Over 15 minutes of exam, counseling, chart review, and critical decision making was performed.   Future Appointments  Date Time Provider [place]  03/29/2021 10:00 AM [name], NP [facility] None  05/02/2021  9:00 AM [name], NP [facility] None    ------------------------------------------------------------------------------------------------------------------   HPI BP 130/76   Pulse 84   Temp (!) 96.4 F (35.8 C)   Wt 194 lb (88 kg)   SpO2 96%   BMI 27.06 kg/m   70 y.o.male presents for evaluation of burning rash to R shoulder x 5 days.   He reports abruptly appeared with tenderness/burning, not changing, hasn't tried anything. Notes this seems to be improving.    Past Medical History:  Diagnosis Date  . CAD (coronary artery disease)    a. s/p stent to RCA 2005; b. LHC 2006 after Nuc with inf ischemia: patent RCA stent, D1 80%, LAD 60% => med Rx;  c. LHC 04/18/11: Proximal LAD 60% (unchanged from previous catheterization), distal LAD 65% (small caliber), Dx 60%, proximal CFX 20%, mid CFX 50%, OM1 30%, proximal RCA 20%, mid RCA stent patent, EF 65%.=> med Rx  . GERD (gastroesophageal reflux disease)   . HLD  (hyperlipidemia)   . Hypertension   . Obesity   . OSA (obstructive sleep apnea)    has a cpap-does not use-has a special mouthpiece he wears at night  . Type II or unspecified type diabetes mellitus without mention of complication, not stated as uncontrolled   . Vitamin D deficiency   . Wears glasses      No Known Allergies  Current Outpatient Medications on File Prior to Visit  Medication Sig  . acetaminophen (TYLENOL) 500 MG tablet Take 2 tablets (1,000 mg total) by mouth every 6 (six) hours.  Marland Kitchen aspirin EC 81 MG tablet Take 1 tablet (81 mg total) by mouth daily.  . benzonatate (TESSALON) 200 MG capsule Take  1 capsule  3 x /day  to Prevent Cough  . blood glucose meter kit and supplies Dispense based on patient and insurance preference. Use up to four times daily as directed. (FOR ICD-9 250.00, 250.01).  . blood glucose meter kit and supplies Dispense based on patient and insurance preference. Use up to four times daily as directed. (FOR ICD-10 E10.9, E11.9).  . cetirizine (ZYRTEC) 10 MG tablet Take 10 mg by mouth as needed for allergies.   . Cholecalciferol (VITAMIN D) 50 MCG (2000 UT) tablet Take 1 tablet (2,000 Units total) by mouth daily.  Marland Kitchen doxazosin (CARDURA) 8 MG tablet Take  1 tablet  at Bedtime  for BP & Prostate  . Dulaglutide ([name]) 6.19 JK/9.3OI SOPN Inject 0.75  mg into the skin once a week.  . fexofenadine (ALLEGRA) 60 MG tablet Take 60 mg by mouth daily as needed for allergies or rhinitis.  . finasteride (PROSCAR) 5 MG tablet Take 1 tablet daily for prostate.  . fluticasone furoate-vilanterol (BREO ELLIPTA) 100-25 MCG/INH AEPB INHALE 1 PUFF BY MOUTH ONCE DAILY. RINSE MOUTH WITH WATER AFTER EACH USE.  Marland Kitchen glimepiride (AMARYL) 4 MG tablet TAKE 1/2-1 TABLET BY MOUTH TWICE DAILY WITH MEALS FOR DIABETES  . glucose blood test strip Test blood sugars once daily.Dx: E11.9  . metFORMIN (GLUCOPHAGE) 1000 MG tablet Take  1 tablet  2 x /day  with Meals  for Diabetes  . ramipril  (ALTACE) 5 MG capsule Take 1 capsule by mouth once daily  . rOPINIRole (REQUIP) 1 MG tablet Take  1 tablet  3 x /day  for Restless Legs  . rosuvastatin (CRESTOR) 20 MG tablet Take 1 tab three nights a week for cholesterol.   No current facility-administered medications on file prior to visit.    ROS: all negative except above.   Physical Exam:  BP 130/76   Pulse 84   Temp (!) 96.4 F (35.8 C)   Wt 194 lb (88 kg)   SpO2 96%   BMI 27.06 kg/m   General Appearance: Well nourished, in no apparent distress. Eyes: conjunctiva no swelling or erythema ENT/Mouth: Mask in place; Hearing normal.  Neck: Supple Respiratory: Respiratory effort normal, BS equal bilaterally without rales, rhonchi, wheezing or stridor.  Cardio: RRR with no MRGs. Brisk peripheral pulses without edema.  Lymphatics: Non tender without lymphadenopathy.  Musculoskeletal: normal gait.  Skin: Warm, dry; R shoulder with scattered scabbed and vesicular lesions in lineal distribution, mild erythematous at base Neuro: Normal muscle tone, Sensation intact.  Psych: Awake and oriented X 3, normal affect, Insight and Judgment appropriate.     Izora Ribas, NP 11:57 AM [facility]

## 2021-01-11 ENCOUNTER — Ambulatory Visit: Payer: Medicare Other | Admitting: Adult Health

## 2021-01-13 ENCOUNTER — Ambulatory Visit: Payer: Medicare Other | Admitting: Adult Health

## 2021-01-19 ENCOUNTER — Telehealth: Payer: Self-pay

## 2021-01-19 NOTE — Telephone Encounter (Signed)
Patient states that the Ropinirole and gabapentin aren't helping. Says his legs are making him really uncomfortable.  Wanting to know if we can send something else in for him?

## 2021-01-21 ENCOUNTER — Other Ambulatory Visit: Payer: Self-pay | Admitting: Adult Health

## 2021-01-21 ENCOUNTER — Other Ambulatory Visit: Payer: Self-pay

## 2021-01-21 DIAGNOSIS — G2581 Restless legs syndrome: Secondary | ICD-10-CM

## 2021-01-21 MED ORDER — ROPINIROLE HCL 2 MG PO TABS: ORAL_TABLET | ORAL | 0 refills | Status: DC

## 2021-01-21 NOTE — Telephone Encounter (Signed)
Patient advised and will call back on Monday to let us know how he did over the weekend

## 2021-01-28 ENCOUNTER — Ambulatory Visit: Payer: Medicare Other | Admitting: Adult Health

## 2021-02-03 ENCOUNTER — Encounter: Payer: Self-pay | Admitting: Gastroenterology

## 2021-02-18 ENCOUNTER — Ambulatory Visit
Admission: RE | Admit: 2021-02-18 | Discharge: 2021-02-18 | Disposition: A | Discharge status: Home / Self Care | Payer: Medicare Other | Source: Ambulatory Visit | Attending: Adult Health | Admitting: Adult Health

## 2021-02-18 DIAGNOSIS — F17200 Nicotine dependence, unspecified, uncomplicated: Secondary | ICD-10-CM

## 2021-02-18 DIAGNOSIS — F17210 Nicotine dependence, cigarettes, uncomplicated: Secondary | ICD-10-CM | POA: Diagnosis not present

## 2021-02-21 ENCOUNTER — Other Ambulatory Visit: Payer: Self-pay | Admitting: Adult Health

## 2021-02-21 ENCOUNTER — Encounter: Payer: Self-pay | Admitting: Adult Health

## 2021-02-21 DIAGNOSIS — J849 Interstitial pulmonary disease, unspecified: Secondary | ICD-10-CM

## 2021-02-21 DIAGNOSIS — K8020 Calculus of gallbladder without cholecystitis without obstruction: Secondary | ICD-10-CM | POA: Insufficient documentation

## 2021-02-21 HISTORY — DX: Interstitial pulmonary disease, unspecified: J84.9

## 2021-02-21 HISTORY — DX: Calculus of gallbladder without cholecystitis without obstruction: K80.20

## 2021-02-22 ENCOUNTER — Other Ambulatory Visit: Payer: Self-pay | Admitting: Internal Medicine

## 2021-02-22 DIAGNOSIS — E119 Type 2 diabetes mellitus without complications: Secondary | ICD-10-CM

## 2021-03-28 NOTE — Progress Notes (Signed)
MEDICARE ANNUAL WELLNESS  Assessment and Plan:   Annual Medicare Wellness Visit Due annually  Health maintenance reviewed Check with insurance about shingrix  Aortic atherosclerosis ([place]) - CT 11/2016 Control blood pressure, cholesterol, glucose, increase exercise.   Essential hypertension Continue medications Monitor blood pressure at home; call if consistently over 130/80 Continue DASH diet.   Reminder to go to the ER if any CP, SOB, nausea, dizziness, severe HA, changes vision/speech, left arm numbness and tingling and jaw pain.  Coronary artery disease involving native heart  Control blood pressure, cholesterol, glucose, increase exercise.  Denies angina; normal myoview 07/2019 per Dr. Johnsie Cancel Continue cardio follow up Stop smoking  OSA and COPD overlap syndrome ([place]) Wears mouthpiece, followed by Dr. Toy Cookey Will retry mask due to lack of perceived benefit with oral device; states he will reach out to DME  [facility]) He plans to quit smoking, pending pulm evaluation 04/2021  Chronic obstructive pulmonary disease, unspecified COPD type ([place]) Continue inhalers, stop smoking, has recent CT lung   Type 2 diabetes mellitus with other circulatory complication, without long-term current use of insulin [facility]) Education: Reviewed 'ABCs' of diabetes management (respective goals in parentheses):  A1C (<7), blood pressure (<130/80), and cholesterol (LDL <70) Eye Exam yearly and Dental Exam every 6 months - schedule dental Resume metformin 1000 mg BID, increase trulicity to 1.5 mg/week, if well controlled glucose advised taper plan with glimepiride to maintain fasting <130 Dietary recommendations Physical Activity recommendations  Benign prostatic hyperplasia with urinary frequency Doxazosin and finasteride Followed by urology   Vitamin D deficiency Continue supplementation Check vitamin D level annually and PRN; defer today   Smoking Discussed risks associated with tobacco use  and advised to reduce or quit Patient is ready to do so and plans to taper with patches Wife is also quitting Resources given  Will follow up at the next visit Strategies for success discussed.  Had chest CT 02/2021  RLS (restless legs syndrome) Increase daytime activity, continue requip 6 mg at night which is working well, add 1.5 - 3 mg at lunch PRN breakthrough  Mixed hyperlipidemia Continue medications, LDL goal <70 Continue low cholesterol diet and exercise.  Check lipid panel.   Medication management CBC, CMP/GFR, magnesium  Elevated PSA Check PSA annually Followed by urology   Overweight (BMI 25.0-29.9) Long discussion about weight loss, diet, and exercise Recommended diet heavy in fruits and veggies and low in animal meats, cheeses, and dairy products, appropriate calorie intake Discussed appropriate weight for height  Follow up at next visit  B12 deficiency  Recheck levels; now on supplement  Lower back pain - negative straight leg Prednisone was not prescribed,NSAIDs, RICE, and exercise given If not better follow up in office or will refer to PT/orthopedics. Natural history and expected course discussed. Questions answered. Proper lifting, bending technique discussed. Stretching exercises discussed. Heat to affected area as needed for local pain relief. Follow up in 2 weeks if not improving, plan imaging vs referral     Orders Placed This Encounter  Procedures   CBC with Differential/Platelet   COMPLETE METABOLIC PANEL WITH GFR   Magnesium   Lipid panel   TSH   Hemoglobin A1c   Vitamin B12      Discussed med's effects and SE's. Screening labs and tests as requested with regular follow-up as recommended. Over 40 minutes of exam, counseling, chart review and critical decision making was performed  Future Appointments  Date Time Provider [place]  04/05/2021  9:00 AM [facility]  PREVISIT RM 50 [facility]  04/11/2021  9:00 AM Mannam,  Praveen, MD [facility] None  04/19/2021 11:30 AM Ladene Artist, MD [facility]  06/29/2021 10:00 AM Magda Bernheim, NP [facility] None  07/19/2021  7:45 AM Imogene Burn, PA-C [facility]  03/29/2022 11:00 AM [name], NP [facility] None    Plan:   During the course of the visit the patient was educated and counseled about appropriate screening and preventive services including:   Pneumococcal vaccine  Prevnar 13 Influenza vaccine Td vaccine Screening electrocardiogram Bone densitometry screening Colorectal cancer screening Diabetes screening Glaucoma screening Nutrition counseling  Advanced directives: requested   Joe Green 70 y.o. male patient presents for AWV and follow up.   He reports worked rebuilding his deck, has had R sided lumbar discomfort without midlines pain, last 2-3 weeks, denies radicular sx, numbness/tingling, has tried tylenol with some improvement. Requesting antiinflammatory -   Follows with Dr. Erlinda Hong for ortho concerns - had shoulder injection and doing well, knees do bother him some, takes voltaren PRN.    he is a smoker, cutting down, but admits smoking average 1/2 pack; estimated 50 pack year history; has chantix at home and plans to start this sometime soon, wife is also quitting, plans to use the patches and quit this month.   He had Ct lung in 02/18/2021 which did not show any concerning nodules but suggestive of ILD, has been referred to [place], has upcoming in Sept 2022. He does have COPD per imaging and is on Breo which works well for him.    OSA, intolerant of CPAP and had been using mouth piece but he feels didn't work well; managed by Dr. Toy Cookey, has been recommended repeat sleep study but patient adamantly declines repeat. He plans to try again with mask, still has CPAP.     He has RLS, gabapentin didn't help, now on requip taking 6 mg at night, runs out around midafternoon.   BMI is Body mass index is 27.06 kg/m., he  has been working on diet and admits less active since retiring, but "not sitting." staying busy around the home, rebuiding deck, has stationary cycle to ride if no projects.  He is 188 lb at home, at goal previously set for 190 lb and happy with current weight.  Admits to 2-4 beers nightly, 1 cup coffee daily  Generally watching diet  Wt Readings from Last 3 Encounters:  03/29/21 194 lb (88 kg)  01/07/21 194 lb (88 kg)  11/25/20 192 lb 6.4 oz (87.3 kg)   He has a history of CAD s/p PCI to RCA in 2005. He underwent LHC in 2006 and 2012 which showed a proximal LAD 60% (unchanged from previous catheterization), Medical therapy recommended. Has done well since cath in 2012 and followed by Dr. Kyla Balzarine office, had normal follow up myoview in 07/2019.   His blood pressure has been controlled at home, today their BP is BP: 114/64 He does not workout. He denies chest pain, shortness of breath, dizziness.   He is on cholesterol medication (rosuvastastin 20 mg three days a week) and denies myalgias. His cholesterol is at goal. The cholesterol last visit was:   Lab Results  Component Value Date   CHOL 106 10/28/2020   HDL 35 (L) 10/28/2020   LDLCALC 55 10/28/2020   TRIG 82 10/28/2020   CHOLHDL 3.0 10/28/2020   He has been working on diet and exercise for diabetes (on metformin 1000 mg BID - admits only taking  in PM recently, glimepiride 4 mgonce daily, newly on trulicity 8.29 mg weekly), he is on bASA, he is on ACE/ARB and denies foot ulcerations, increased appetite, nausea, paresthesia of the feet, polydipsia, polyuria, visual disturbances, vomiting and weight loss.  ABI - WNL 05/18/2020 He does check fasting sugars ranging 150-160, admits was initially 120s, reduced metformin - willing to increase back.  Last A1C in the office was:  Lab Results  Component Value Date   HGBA1C 9.1 (H) 10/28/2020   Last GFR: Lab Results  Component Value Date   GFRNONAA 97 10/28/2020    Patient is on Vitamin D  supplement, 10000 IU daily    Lab Results  Component Value Date   VD25OH 91 10/28/2020     Has hx of elevated PSAs, BPH with LUTS (nocturia x 2 night) and is followed by [facility]. He is on doxazosin and finasteride.  Lab Results  Component Value Date   PSA 2.4 04/23/2020   PSA 3.3 03/10/2019   PSA 3.8 01/01/2018   B12 was low, reports did start on B complex though not regularly  Lab Results  Component Value Date   VITAMINB12 324 04/23/2020     Current Medications:  Current Outpatient Medications on File Prior to Visit  Medication Sig Dispense Refill   acetaminophen (TYLENOL) 500 MG tablet Take 2 tablets (1,000 mg total) by mouth every 6 (six) hours. 30 tablet 0   aspirin EC 81 MG tablet Take 1 tablet (81 mg total) by mouth daily. 90 tablet 3   blood glucose meter kit and supplies Dispense based on patient and insurance preference. Use up to four times daily as directed. (FOR ICD-9 250.00, 250.01). 1 each 0   Cholecalciferol (VITAMIN D) 50 MCG (2000 UT) tablet Take 1 tablet (2,000 Units total) by mouth daily.     doxazosin (CARDURA) 8 MG tablet Take  1 tablet  at Bedtime  for BP & Prostate 90 tablet 3   fexofenadine (ALLEGRA) 60 MG tablet Take 60 mg by mouth daily as needed for allergies or rhinitis.     finasteride (PROSCAR) 5 MG tablet Take 1 tablet daily for prostate. 90 tablet 1   fluticasone furoate-vilanterol (BREO ELLIPTA) 100-25 MCG/INH AEPB INHALE 1 PUFF BY MOUTH ONCE DAILY. RINSE MOUTH WITH WATER AFTER EACH USE. 60 each 5   glimepiride (AMARYL) 4 MG tablet TAKE 1/2-1 TABLET BY MOUTH TWICE DAILY WITH MEALS FOR DIABETES 180 tablet 1   glucose blood test strip Test blood sugars once daily.Dx: E11.9 100 each 2   metFORMIN (GLUCOPHAGE) 1000 MG tablet Take  1 tablet  2 x /day with Meals  for Diabetes  / Patient knows to take by mouth 180 tablet 3   ramipril (ALTACE) 5 MG capsule Take 1 capsule   Daily  For BP  & Diabetic Kidney Protection (Dx: e11.29) 90 capsule 3    rosuvastatin (CRESTOR) 20 MG tablet Take 1 tab three nights a week for cholesterol. 90 tablet 1   No current facility-administered medications on file prior to visit.   Allergies:  No Known Allergies Health Maintenance:  Immunization History  Administered Date(s) Administered   Influenza, High Dose Seasonal PF 06/22/2017, 08/03/2018, 05/15/2019, 07/28/2020   Influenza-Unspecified 06/02/2013, 08/18/2014   PFIZER(Purple Top)SARS-COV-2 Vaccination 11/19/2019, 12/10/2019   Pneumococcal Conjugate-13 11/21/2016   Pneumococcal Polysaccharide-23 08/27/2007, 01/01/2018   Td 01/01/2018   Tdap 08/27/2007   Zoster, Live 10/20/2013   Tetanus: 2019 Pneumovax: 2009, 2019 Prevnar 13: 2018 Flu vaccine: 05/2020 Zostavax:  2015, check with insurance about shingrix, ? Had breakthrough case in May 2022 Covid 19: 2/2, 2021, pfizer- + booster, requested record  DEXA: n/a Colonoscopy: 2013 OVERDUE 5 year follow up- referred back in July 2020, patient never scheduled,states he has upcoming scheduled 04/19/2021.   EGD: -  CT lung:  02/2021 - aortic atherosclerosis, emphysema , ILD - was referred to pulm  Stress test: 07/2019  Names of Other Physician/Practitioners you currently use: 1. [facility] here for primary care 2. Dr. Katy Fitch, eye doctor, last visit 09/07/2020 without retinopathy 3. Dr. ?, dentist, last visit remote, had scheduled but needs to find new dentist due to insurance 4. Dr. ?, derm, last few years ago, plans to schedule follow up this year   Patient Care Team: [name], MD as PCP - General (Internal Medicine) Josue Hector, MD as Consulting Physician (Cardiology) Ladene Artist, MD as Consulting Physician (Gastroenterology)  Medical History:  has Hyperlipidemia associated with type 2 diabetes mellitus [facility]); CAD (coronary artery disease); Hypertension; OSA and COPD overlap syndrome ([place]); Smoking; Vitamin D deficiency; Medication  management; Type 2 diabetes mellitus (H[facility]); RLS (restless legs syndrome); COPD (chronic obstructive pulmonary disease) (H[facility]); BPH (benign prostatic hyperplasia); Elevated PSA; Chondromalacia, left knee; Chondromalacia, right knee; Overweight (BMI 25.0-29.9); Aortic atherosclerosis (H[facility])by Chest CT scan 11/2016; Hepatic steatosis; B12 deficiency; History of colonic polyps; Excessive drinking alcohol; ILD (interstitial lung disease) ([place]); and Gallstones on their problem list. Surgical History:  He  has a past surgical history that includes heart stent (2005); Colonoscopy (11/2011); Polypectomy; Cardiac catheterization (2005); Inguinal hernia repair (Right, 11/16/2014); Insertion of mesh (Right, 11/16/2014); Knee arthroscopy with medial menisectomy (Bilateral, 06/25/2017); and Chondroplasty (06/25/2017). Family History:  His family history includes Cancer in his mother; Hypertension in his father; Kidney cancer in his maternal grandmother; Prostate cancer in his father. Social History:   reports that he has quit smoking. His smoking use included cigarettes. He started smoking about 52 years ago. He has a 51.00 pack-year smoking history. He has never used smokeless tobacco. He reports current alcohol use of about 28.0 standard drinks per week. He reports that he does not use drugs.  MEDICARE WELLNESS OBJECTIVES: Physical activity: Exercise limited by: orthopedic condition(s) Cardiac risk factors: Cardiac Risk Factors include: advanced age (>27mn, >>67women);dyslipidemia;hypertension;male gender;smoking/ tobacco exposure;diabetes mellitus Depression/mood screen:   Depression screen [facility] 03/29/2021  Decreased Interest 0  Down, Depressed, Hopeless 0  PHQ - 2 Score 0    ADLs:  In your present state of health, do you have any difficulty performing the following activities: 03/29/2021 07/31/2020  Hearing? N N  Vision? N N  Difficulty concentrating or making decisions? N N  Walking or climbing stairs? N N   Dressing or bathing? N N  Doing errands, shopping? N N  Some recent data might be hidden     Cognitive Testing  Alert? Yes  Normal Appearance?Yes  Oriented to person? Yes  Place? Yes   Time? Yes  Recall of three objects?  Yes  Can perform simple calculations? Yes  Displays appropriate judgment?Yes  Can read the correct time from a watch face?Yes  EOL planning: Does Patient Have a Medical Advance Directive?: Yes Type of Advance Directive: Healthcare Power of Attorney, Living will Does patient want to make changes to medical advance directive?: No - Patient declined [place] of [place]?: No - copy requested     Review of Systems:  Review of Systems  Constitutional:  Negative for malaise/fatigue and weight loss.  HENT:  Negative for hearing loss and tinnitus.   Eyes:  Negative for blurred vision and double vision.  Respiratory:  Negative for cough, sputum production, shortness of breath and wheezing.   Cardiovascular:  Negative for chest pain, palpitations, orthopnea, claudication, leg swelling and PND.  Gastrointestinal:  Negative for abdominal pain, blood in stool, constipation, diarrhea, heartburn, melena, nausea and vomiting.  Genitourinary: Negative.        Nocturia x 2  Musculoskeletal:  Positive for back pain (lumbar, R, no radicular). Negative for falls, joint pain and myalgias.  Skin:  Negative for rash.  Neurological:  Negative for dizziness, tingling, sensory change, weakness and headaches.  Endo/Heme/Allergies:  Positive for environmental allergies (mild, declines tx). Negative for polydipsia.  Psychiatric/Behavioral:  Negative for depression, memory loss, substance abuse and suicidal ideas. The patient is not nervous/anxious and does not have insomnia (improved with requip).   All other systems reviewed and are negative.  Physical Exam: Estimated body mass index is 27.06 kg/m as calculated from the following:   Height as of this  encounter: 5' 11"  (1.803 m).   Weight as of this encounter: 194 lb (88 kg). BP 114/64   Pulse 73   Temp 97.7 F (36.5 C)   Ht 5' 11"  (1.803 m)   Wt 194 lb (88 kg)   SpO2 97%   BMI 27.06 kg/m  General Appearance: Well nourished, in no apparent distress.  Eyes: PERRLA, EOMs, conjunctiva no swelling or erythema Sinuses: No Frontal/maxillary tenderness  ENT/Mouth: Ext aud canals clear, normal light reflex with TMs without erythema, bulging. Good dentition. No erythema, swelling, or exudate on post pharynx. Tonsils not swollen or erythematous. Hearing normal.  Neck: Supple, thyroid normal. No bruits  Respiratory: Respiratory effort normal, BS equal bilaterally without rales, rhonchi, wheezing or stridor.  Cardio: RRR without murmurs, rubs or gallops. Intact brisk pedal pulses, post tibial pulses are faint,  without edema.  Chest: symmetric, with normal excursions and percussion.  Abdomen: Soft, nontender, no guarding, rebound, masses, or organomegaly. Mild ventral hernia with bearing down.  Lymphatics: Non tender without lymphadenopathy.  Genitourinary: No concerns, declines; urology following Musculoskeletal: Full ROM all peripheral extremities,5/5 strength, and normal gait.  Skin: Warm, dry without rashes, lesions, ecchymosis. Neuro: Cranial nerves intact, reflexes equal bilaterally. Normal muscle tone, no cerebellar symptoms. Sensation intact.  Psych: Awake and oriented X 3, normal affect, Insight and Judgment appropriate.    Medicare Attestation I have personally reviewed: The patient's medical and social history Their use of alcohol, tobacco or illicit drugs Their current medications and supplements The patient's functional ability including ADLs,fall risks, home safety risks, cognitive, and hearing and visual impairment Diet and physical activities Evidence for depression or mood disorders  The patient's weight, height, BMI, and visual acuity have been recorded in the chart.  I  have made referrals, counseling, and provided education to the patient based on review of the above and I have provided the patient with a written personalized care plan for preventive services.     Gorden Harms Jamiya Nims 12:01 PM [facility]

## 2021-03-29 ENCOUNTER — Encounter: Payer: Self-pay | Admitting: Adult Health

## 2021-03-29 ENCOUNTER — Other Ambulatory Visit: Payer: Self-pay

## 2021-03-29 ENCOUNTER — Ambulatory Visit (INDEPENDENT_AMBULATORY_CARE_PROVIDER_SITE_OTHER): Payer: Medicare Other | Admitting: Adult Health

## 2021-03-29 VITALS — BP 114/64 | HR 73 | Temp 97.7°F | Ht 71.0 in | Wt 194.0 lb

## 2021-03-29 DIAGNOSIS — K8020 Calculus of gallbladder without cholecystitis without obstruction: Secondary | ICD-10-CM

## 2021-03-29 DIAGNOSIS — F17200 Nicotine dependence, unspecified, uncomplicated: Secondary | ICD-10-CM

## 2021-03-29 DIAGNOSIS — G4733 Obstructive sleep apnea (adult) (pediatric): Secondary | ICD-10-CM

## 2021-03-29 DIAGNOSIS — Z860100 Personal history of colon polyps, unspecified: Secondary | ICD-10-CM

## 2021-03-29 DIAGNOSIS — Z0001 Encounter for general adult medical examination with abnormal findings: Secondary | ICD-10-CM

## 2021-03-29 DIAGNOSIS — E1169 Type 2 diabetes mellitus with other specified complication: Secondary | ICD-10-CM | POA: Diagnosis not present

## 2021-03-29 DIAGNOSIS — E1159 Type 2 diabetes mellitus with other circulatory complications: Secondary | ICD-10-CM

## 2021-03-29 DIAGNOSIS — N401 Enlarged prostate with lower urinary tract symptoms: Secondary | ICD-10-CM

## 2021-03-29 DIAGNOSIS — J449 Chronic obstructive pulmonary disease, unspecified: Secondary | ICD-10-CM | POA: Diagnosis not present

## 2021-03-29 DIAGNOSIS — F1010 Alcohol abuse, uncomplicated: Secondary | ICD-10-CM

## 2021-03-29 DIAGNOSIS — G2581 Restless legs syndrome: Secondary | ICD-10-CM

## 2021-03-29 DIAGNOSIS — J849 Interstitial pulmonary disease, unspecified: Secondary | ICD-10-CM

## 2021-03-29 DIAGNOSIS — Z0000 Encounter for general adult medical examination without abnormal findings: Secondary | ICD-10-CM

## 2021-03-29 DIAGNOSIS — R972 Elevated prostate specific antigen [PSA]: Secondary | ICD-10-CM

## 2021-03-29 DIAGNOSIS — E559 Vitamin D deficiency, unspecified: Secondary | ICD-10-CM

## 2021-03-29 DIAGNOSIS — E663 Overweight: Secondary | ICD-10-CM

## 2021-03-29 DIAGNOSIS — E785 Hyperlipidemia, unspecified: Secondary | ICD-10-CM | POA: Diagnosis not present

## 2021-03-29 DIAGNOSIS — I10 Essential (primary) hypertension: Secondary | ICD-10-CM | POA: Diagnosis not present

## 2021-03-29 DIAGNOSIS — I700 Atherosclerosis of aorta: Secondary | ICD-10-CM

## 2021-03-29 DIAGNOSIS — R350 Frequency of micturition: Secondary | ICD-10-CM

## 2021-03-29 DIAGNOSIS — IMO0001 Reserved for inherently not codable concepts without codable children: Secondary | ICD-10-CM

## 2021-03-29 DIAGNOSIS — K760 Fatty (change of) liver, not elsewhere classified: Secondary | ICD-10-CM

## 2021-03-29 DIAGNOSIS — Z79899 Other long term (current) drug therapy: Secondary | ICD-10-CM

## 2021-03-29 DIAGNOSIS — R6889 Other general symptoms and signs: Secondary | ICD-10-CM | POA: Diagnosis not present

## 2021-03-29 DIAGNOSIS — I2510 Atherosclerotic heart disease of native coronary artery without angina pectoris: Secondary | ICD-10-CM

## 2021-03-29 DIAGNOSIS — E538 Deficiency of other specified B group vitamins: Secondary | ICD-10-CM

## 2021-03-29 MED ORDER — TRULICITY 1.5 MG/0.5ML ~~LOC~~ SOAJ: 1.5000 mg | SUBCUTANEOUS | 3 refills | Status: DC

## 2021-03-29 MED ORDER — ROPINIROLE HCL 3 MG PO TABS: ORAL_TABLET | ORAL | 1 refills | Status: DC

## 2021-03-29 MED ORDER — MELOXICAM 15 MG PO TABS: ORAL_TABLET | ORAL | 0 refills | Status: DC

## 2021-03-29 MED ORDER — BENZONATATE 200 MG PO CAPS: ORAL_CAPSULE | ORAL | 0 refills | Status: DC

## 2021-03-29 NOTE — Patient Instructions (Addendum)
SMOKING CESSATION  [employer]  [phone] for more information or for a free program for smoking cessation help.   You can call QUIT SMART 1-800-QUIT-NOW for free nicotine patches or replacement therapy- if they are out- keep calling  [facility] Can call for smoking cessation classes, [phone]  If you have a smart phone, please look up Smoke Free app, this will help you stay on track and give you information about money you have saved, life that you have gained back and a ton of more information.     ADVANTAGES OF QUITTING SMOKING Within 20 minutes, blood pressure decreases. Your pulse is at normal level. After 8 hours, carbon monoxide levels in the blood return to normal. Your oxygen level increases. After 24 hours, the chance of having a heart attack starts to decrease. Your breath, hair, and body stop smelling like smoke. After 48 hours, damaged nerve endings begin to recover. Your sense of taste and smell improve. After 72 hours, the body is virtually free of nicotine. Your bronchial tubes relax and breathing becomes easier. After 2 to 12 weeks, lungs can hold more air. Exercise becomes easier and circulation improves. After 1 year, the risk of coronary heart disease is cut in half. After 5 years, the risk of stroke falls to the same as a nonsmoker. After 10 years, the risk of lung cancer is cut in half and the risk of other cancers decreases significantly. After 15 years, the risk of coronary heart disease drops, usually to the level of a nonsmoker. You will have extra money to spend on things other than cigarettes.   Nicotine Patches What is this medication? NICOTINE (Castor [place] teen) helps you quit smoking. It reduces cravings for nicotine, the addictive substance found in tobacco. It may also help reduce symptoms of withdrawal. It is most effective when used in combination with astop-smoking program. This medicine may be used for other purposes;  ask your health care provider orpharmacist if you have questions. COMMON BRAND NAME(S): Habitrol, Nicoderm CQ, Nicotrol What should I tell my care team before I take this medication? They need to know if you have any of these conditions: Diabetes Heart disease, angina, irregular heartbeat or previous heart attack High blood pressure Lung disease, including asthma Overactive thyroid Pheochromocytoma Seizures or a history of seizures Skin problems, like eczema Stomach problems or ulcers An unusual or allergic reaction to nicotine, adhesives, other medications, foods, dyes, or preservatives Pregnant or trying to get pregnant Breast-feeding How should I use this medication? This medication is for external use only. Follow the directions on the label. Do not cut or trim the patch. After applying the patch, wash your hands. Change the patch every day in the morning, keeping to a regular schedule. When you apply a new patch, use a new area of skin. Wait at least 1 week before usingthe same area again. This medication comes with INSTRUCTIONS FOR USE. Ask your pharmacist for directions on how to use this medication. Read the information carefully. Talkto your pharmacist or care team if you have questions. Talk to your care team about the use of this medication in children. Specialcare may be needed. Overdosage: If you think you have taken too much of this medicine contact apoison control center or emergency room at once. NOTE: This medicine is only for you. Do not share this medicine with others. What if I miss a dose? If you forget to replace a patch, use it as soon as you can.  Only use one patch at a time and do not leave on the skin for longer than directed. If a patch falls off, you can replace it, but keep to your schedule and remove the patchat the right time. What may interact with this medication? Certain medications for lung or breathing disease, like asthma Medications for blood  pressure Medications for depression This list may not describe all possible interactions. Give your health care provider a list of all the medicines, herbs, non-prescription drugs, or dietary supplements you use. Also tell them if you smoke, drink alcohol, or use illegaldrugs. Some items may interact with your medicine. What should I watch for while using this medication? Visit your care team for regular checks on your progress. Talk to your care team about what you can do to improve your chances ofquitting. If you are going to need surgery, an MRI, CT scan, or other procedure, tell your care team that you are using this medication. You may need to remove thepatch before the procedure. What side effects may I notice from receiving this medication? Side effects that you should report to your care team as soon as possible: Allergic reactions-skin rash, itching, hives, swelling of the face, lips, tongue, or throat Heart palpitations-rapid, pounding, or irregular heartbeat Increase in blood pressure Side effects that usually do not require medical attention (report to your careteam if they continue or are bothersome): Dizziness Headache Heartburn Hiccups Irritation at application site Trouble sleeping This list may not describe all possible side effects. Call your doctor for medical advice about side effects. You may report side effects to FDA at1-800-FDA-1088. Where should I keep my medication? This product may have enough nicotine in it to make children and pets sick. Keep it away from children and pets. After using, throw away as directed on thepackage. Store at room temperature between 20 and 25 degrees C (68 and 77 degrees F). Protect from heat and light. Keep this medication in the original containeruntil ready to use. Throw away unused medication after the expiration date. NOTE: This sheet is a summary. It may not cover all possible information. If you have questions about this medicine,  talk to your doctor, pharmacist, orhealth care provider.  2022 Elsevier/Gold Standard (2020-09-06 12:16:58)

## 2021-03-30 LAB — LIPID PANEL
Cholesterol: 158 mg/dL (ref <200)
HDL: 42 mg/dL (ref >=40)
LDL Cholesterol (Calc): 98 mg/dL (calc)
Non-HDL Cholesterol (Calc): 116 mg/dL (calc) (ref <130)
Total CHOL/HDL Ratio: 3.8 (calc) (ref <5.0)
Triglycerides: 86 mg/dL (ref <150)

## 2021-03-30 LAB — COMPLETE METABOLIC PANEL WITH GFR
AG Ratio: 1.6 (calc) (ref 1.0–2.5)
ALT: 15 U/L (ref 9–46)
AST: 10 U/L (ref 10–35)
Albumin: 4.2 g/dL (ref 3.6–5.1)
Alkaline phosphatase (APISO): 106 U/L (ref 35–144)
BUN: 15 mg/dL (ref 7–25)
CO2: 26 mmol/L (ref 20–32)
Calcium: 9.4 mg/dL (ref 8.6–10.3)
Chloride: 102 mmol/L (ref 98–110)
Creat: 0.78 mg/dL (ref 0.70–1.35)
Globulin: 2.7 g/dL (calc) (ref 1.9–3.7)
Glucose, Bld: 145 mg/dL — ABNORMAL HIGH (ref 65–99)
Potassium: 4.3 mmol/L (ref 3.5–5.3)
Sodium: 136 mmol/L (ref 135–146)
Total Bilirubin: 0.4 mg/dL (ref 0.2–1.2)
Total Protein: 6.9 g/dL (ref 6.1–8.1)
eGFR: 97 mL/min/{1.73_m2} (ref >=60)

## 2021-03-30 LAB — HEMOGLOBIN A1C
Hgb A1c MFr Bld: 7.1 % of total Hgb — ABNORMAL HIGH (ref <5.7)
Mean Plasma Glucose: 157 mg/dL
eAG (mmol/L): 8.7 mmol/L

## 2021-03-30 LAB — CBC WITH DIFFERENTIAL/PLATELET
Absolute Monocytes: 807 cells/uL (ref 200–950)
Basophils Absolute: 55 cells/uL (ref 0–200)
Basophils Relative: 0.5 %
Eosinophils Absolute: 305 cells/uL (ref 15–500)
Eosinophils Relative: 2.8 %
HCT: 43.9 % (ref 38.5–50.0)
Hemoglobin: 14.6 g/dL (ref 13.2–17.1)
Lymphs Abs: 2027 cells/uL (ref 850–3900)
MCH: 31.2 pg (ref 27.0–33.0)
MCHC: 33.3 g/dL (ref 32.0–36.0)
MCV: 93.8 fL (ref 80.0–100.0)
MPV: 11.9 fL (ref 7.5–12.5)
Monocytes Relative: 7.4 %
Neutro Abs: 7706 cells/uL (ref 1500–7800)
Neutrophils Relative %: 70.7 %
Platelets: 195 10*3/uL (ref 140–400)
RBC: 4.68 10*6/uL (ref 4.20–5.80)
RDW: 11.8 % (ref 11.0–15.0)
Total Lymphocyte: 18.6 %
WBC: 10.9 10*3/uL — ABNORMAL HIGH (ref 3.8–10.8)

## 2021-03-30 LAB — MAGNESIUM: Magnesium: 1.9 mg/dL (ref 1.5–2.5)

## 2021-03-30 LAB — TSH: TSH: 1.41 mIU/L (ref 0.40–4.50)

## 2021-03-30 LAB — VITAMIN B12: Vitamin B-12: 220 pg/mL (ref 200–1100)

## 2021-04-05 ENCOUNTER — Ambulatory Visit (AMBULATORY_SURGERY_CENTER): Payer: Self-pay | Admitting: *Deleted

## 2021-04-05 ENCOUNTER — Other Ambulatory Visit: Payer: Self-pay

## 2021-04-05 VITALS — Ht 71.0 in | Wt 196.0 lb

## 2021-04-05 DIAGNOSIS — Z86010 Personal history of colonic polyps: Secondary | ICD-10-CM

## 2021-04-05 MED ORDER — PEG-KCL-NACL-NASULF-NA ASC-C 100 G PO SOLR: 1.0000 | Freq: Once | ORAL | 0 refills | Status: AC

## 2021-04-05 NOTE — Progress Notes (Signed)
Patient is here in-person for PV. Patient denies any allergies to eggs or soy. Patient denies any problems with anesthesia/sedation. Patient denies any oxygen use at home. Patient denies taking any diet/weight loss medications or blood thinners. Patient is aware of our care-partner policy and 0000000 safety protocol.   Patient is COVID-19 vaccinated.

## 2021-04-11 ENCOUNTER — Encounter: Payer: Self-pay | Admitting: Pulmonary Disease

## 2021-04-11 ENCOUNTER — Other Ambulatory Visit: Payer: Self-pay

## 2021-04-11 ENCOUNTER — Ambulatory Visit: Payer: Medicare Other | Admitting: Pulmonary Disease

## 2021-04-11 VITALS — BP 118/64 | HR 71 | Temp 97.8°F | Ht 70.0 in | Wt 196.3 lb

## 2021-04-11 DIAGNOSIS — J849 Interstitial pulmonary disease, unspecified: Secondary | ICD-10-CM

## 2021-04-11 NOTE — Patient Instructions (Signed)
I have reviewed your scan which shows some scarring in the lung. We will get some blood test, order a specialized scan of the chest, high-resolution CT and schedule pulmonary function test for further evaluation Return to clinic after these have been for review and plan for next steps

## 2021-04-11 NOTE — Progress Notes (Addendum)
Joe Green    [id number]    [birth year]  Primary Care Physician:McKeown, Gwyndolyn Saxon, MD  Referring Physician: [name], NP [address] [place],  [place] [address]  Chief complaint: Consult for abnormal CT, ILD evaluation  HPI: 70 year old with history of diabetes, sleep apnea referred for evaluation of abnormal CT with concern for ILD He has chronic cough with mild dyspnea on exertion.  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  Denies any exposures Smoking history: 40-pack-year smoker.  Continues to smoke half pack per day Travel history: No significant travel history Relevant family history: No family history of lung disease  Outpatient Encounter Medications as of 04/11/2021  Medication Sig   acetaminophen (TYLENOL) 500 MG tablet Take 2 tablets (1,000 mg total) by mouth every 6 (six) hours.   aspirin EC 81 MG tablet Take 1 tablet (81 mg total) by mouth daily.   benzonatate (TESSALON) 200 MG capsule Take 1 capsule  3 x /day PRN to Prevent Cough   blood glucose meter kit and supplies Dispense based on patient and insurance preference. Use up to four times daily as directed. (FOR ICD-9 250.00, 250.01).   Cholecalciferol (VITAMIN D) 50 MCG (2000 UT) tablet Take 1 tablet (2,000 Units total) by mouth daily.   doxazosin (CARDURA) 8 MG tablet Take  1 tablet  at Bedtime  for BP & Prostate   Dulaglutide ([name]) 1.5 MO/2.9UT SOPN Inject 1.5 mg into the skin once a week.   fexofenadine (ALLEGRA) 60 MG tablet Take 60 mg by mouth daily as needed for allergies or rhinitis.   finasteride (PROSCAR) 5 MG tablet Take 1 tablet daily for prostate.   fluticasone furoate-vilanterol (BREO ELLIPTA) 100-25 MCG/INH AEPB INHALE 1 PUFF BY MOUTH ONCE DAILY. RINSE MOUTH WITH WATER AFTER EACH USE.   glimepiride (AMARYL) 4 MG tablet TAKE 1/2-1 TABLET BY MOUTH TWICE DAILY WITH MEALS FOR DIABETES    glucose blood test strip Test blood sugars once daily.Dx: E11.9   metFORMIN (GLUCOPHAGE) 1000 MG tablet Take  1 tablet  2 x /day with Meals  for Diabetes  / Patient knows to take by mouth   ramipril (ALTACE) 5 MG capsule Take 1 capsule   Daily  For BP  & Diabetic Kidney Protection (Dx: e11.29)   rOPINIRole (REQUIP) 3 MG tablet Take one tablet three times a day for restless legs.   rosuvastatin (CRESTOR) 20 MG tablet Take 1 tab three nights a week for cholesterol.   meloxicam (MOBIC) 15 MG tablet Take one daily with food for 2 weeks, can take with tylenol, can not take with aleve, iburpofen, then as needed daily for pain (Patient not taking: Reported on 04/11/2021)   No facility-administered encounter medications on file as of 04/11/2021.    Allergies as of 04/11/2021   (No Known Allergies)    Past Medical History:  Diagnosis Date   CAD (coronary artery disease)    a. s/p stent to RCA 2005; b. LHC 2006 after Nuc with inf ischemia: patent RCA stent, D1 80%, LAD 60% => med Rx;  c. LHC 04/18/11: Proximal LAD 60% (unchanged from previous catheterization), distal LAD 65% (small caliber), Dx 60%, proximal CFX 20%, mid CFX 50%, OM1 30%, proximal RCA 20%, mid RCA stent patent, EF 65%.=> med Rx   GERD (gastroesophageal reflux disease)    HLD (hyperlipidemia)    Hypertension    Obesity    OSA (obstructive sleep  apnea)    has a cpap-does not use-has a special mouthpiece he wears at night   Type II or unspecified type diabetes mellitus without mention of complication, not stated as uncontrolled    Vitamin D deficiency    Wears glasses     Past Surgical History:  Procedure Laterality Date   CARDIAC CATHETERIZATION  2005   stent   CHONDROPLASTY  06/25/2017   Procedure: CHONDROPLASTY;  Surgeon: Dorna Leitz, MD;  Location: [facility];  Service: Orthopedics;;   COLONOSCOPY  11/2011   Due 5 years Dr. Fuller Plan   heart stent  2005   RCA   INGUINAL HERNIA REPAIR Right 11/16/2014   Procedure:  RIGHT INGUINAL HERNIA REPAIR WITH MESH;  Surgeon: Armandina Gemma, MD;  Location: [facility];  Service: General;  Laterality: Right;   INSERTION OF MESH Right 11/16/2014   Procedure: INSERTION OF MESH;  Surgeon: Armandina Gemma, MD;  Location: [facility];  Service: General;  Laterality: Right;   KNEE ARTHROSCOPY WITH MEDIAL MENISECTOMY Bilateral 06/25/2017   Procedure: right knee arthroscopy with medial meniscectomy, chondroplasty, Left knee arthroscopy with medial menisectomy removal of plica and chondroplasty;  Surgeon: Dorna Leitz, MD;  Location: [facility];  Service: Orthopedics;  Laterality: Bilateral;   POLYPECTOMY      Family History  Problem Relation Age of Onset   Cancer Mother        vaginal?    Prostate cancer Father    Hypertension Father    Kidney cancer Maternal Grandmother    Colon cancer Neg Hx    Colon polyps Neg Hx    Esophageal cancer Neg Hx    Rectal cancer Neg Hx    Stomach cancer Neg Hx     Social History   Socioeconomic History   Marital status: Married    Spouse name: Not on file   Number of children: 3   Years of education: Not on file   Highest education level: Not on file  Occupational History   Not on file  Tobacco Use   Smoking status: Former    Packs/day: 0.50    Years: 51.00    Pack years: 25.50    Types: Cigarettes    Start date: 1970   Smokeless tobacco: Never  Vaping Use   Vaping Use: Never used  Substance and Sexual Activity   Alcohol use: Yes    Alcohol/week: 14.0 standard drinks    Types: 14 Cans of beer per week   Drug use: No   Sexual activity: Yes    Partners: Female    Birth control/protection: Post-menopausal  Other Topics Concern   Not on file  Social History Narrative   Not on file   Social Determinants of Health   Financial Resource Strain: Not on file  Food Insecurity: Not on file  Transportation Needs: Not on file  Physical Activity: Not on file  Stress: Not on file  Social  Connections: Not on file  Intimate Partner Violence: Not on file    Review of systems: Review of Systems  Constitutional: Negative for fever and chills.  HENT: Negative.   Eyes: Negative for blurred vision.  Respiratory: as per HPI  Cardiovascular: Negative for chest pain and palpitations.  Gastrointestinal: Negative for vomiting, diarrhea, blood per rectum. Genitourinary: Negative for dysuria, urgency, frequency and hematuria.  Musculoskeletal: Negative for myalgias, back pain and joint pain.  Skin: Negative for itching and rash.  Neurological: Negative for dizziness, tremors, focal weakness,  seizures and loss of consciousness.  Endo/Heme/Allergies: Negative for environmental allergies.  Psychiatric/Behavioral: Negative for depression, suicidal ideas and hallucinations.  All other systems reviewed and are negative.  Physical Exam: Blood pressure 118/64, pulse 71, temperature 97.8 F (36.6 C), temperature source Oral, height _0  (1.778 m), weight 196 lb 4.8 oz (89 kg), SpO2 98 %. Gen:      No acute distress HEENT:  EOMI, sclera anicteric Neck:     No masses; no thyromegaly Lungs:   Bibasal crackles CV:         Regular rate and rhythm; no murmurs Abd:      + bowel sounds; soft, non-tender; no palpable masses, no distension Ext:    Normal Skin:      Warm and dry; no rash Neuro: alert and oriented x 3 Psych: normal mood and affect  Data Reviewed: Imaging: Screening CT chest 12/15/2016-centrilobular and paraseptal emphysema.  Minimal subpleural reticulation. Screening CT chest 02/18/2021-centrilobular and paraseptal emphysema, bibasal subpleural reticulation and groundglass with honeycombing.  I have reviewed the images personally.  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  No exposures or symptoms of  CTD  Send CTD serologies, schedule high-resolution CT and pulmonary function test for further evaluation Return to clinic for assessment.  He will likely need to be started on antifibrotic's  Plan/Recommendations: CTD serologies High-res CT, PFTs  Marshell Garfinkel MD [facility] 04/11/2021, 9:30 AM  CC: [name], NP

## 2021-04-13 LAB — ANA,IFA RA DIAG PNL W/RFLX TIT/PATN
Anti Nuclear Antibody (ANA): NEGATIVE
Cyclic Citrullin Peptide Ab: <16 UNITS
Rheumatoid fact SerPl-aCnc: 18 IU/mL — ABNORMAL HIGH (ref <14)

## 2021-04-13 LAB — SJOGREN'S SYNDROME ANTIBODS(SSA + SSB)
SSA (Ro) (ENA) Antibody, IgG: <1 AI
SSB (La) (ENA) Antibody, IgG: <1 AI

## 2021-04-13 LAB — ANCA SCREEN W REFLEX TITER: ANCA Screen: NEGATIVE

## 2021-04-13 LAB — ANTI-SCLERODERMA ANTIBODY: Scleroderma (Scl-70) (ENA) Antibody, IgG: <1 AI

## 2021-04-14 ENCOUNTER — Encounter: Payer: Self-pay | Admitting: Pulmonary Disease

## 2021-04-15 LAB — HYPERSENSITIVITY PNEUMONITIS
A. Pullulans Abs: NEGATIVE
A.Fumigatus #1 Abs: NEGATIVE
Micropolyspora faeni, IgG: NEGATIVE
Pigeon Serum Abs: NEGATIVE
Thermoact. Saccharii: NEGATIVE
Thermoactinomyces vulgaris, IgG: NEGATIVE

## 2021-04-19 ENCOUNTER — Ambulatory Visit (AMBULATORY_SURGERY_CENTER): Payer: Medicare Other | Admitting: Gastroenterology

## 2021-04-19 ENCOUNTER — Other Ambulatory Visit: Payer: Self-pay

## 2021-04-19 ENCOUNTER — Encounter: Payer: Self-pay | Admitting: Gastroenterology

## 2021-04-19 VITALS — BP 106/56 | HR 71 | Temp 98.6°F | Resp 19 | Ht 71.0 in | Wt 196.0 lb

## 2021-04-19 DIAGNOSIS — D124 Benign neoplasm of descending colon: Secondary | ICD-10-CM

## 2021-04-19 DIAGNOSIS — D128 Benign neoplasm of rectum: Secondary | ICD-10-CM | POA: Diagnosis not present

## 2021-04-19 DIAGNOSIS — D123 Benign neoplasm of transverse colon: Secondary | ICD-10-CM

## 2021-04-19 DIAGNOSIS — Z86010 Personal history of colonic polyps: Secondary | ICD-10-CM | POA: Diagnosis not present

## 2021-04-19 DIAGNOSIS — K635 Polyp of colon: Secondary | ICD-10-CM | POA: Diagnosis not present

## 2021-04-19 DIAGNOSIS — Z1211 Encounter for screening for malignant neoplasm of colon: Secondary | ICD-10-CM | POA: Diagnosis not present

## 2021-04-19 MED ORDER — SODIUM CHLORIDE 0.9 % IV SOLN: 500.0000 mL | INTRAVENOUS | Status: DC

## 2021-04-19 NOTE — Op Note (Signed)
[facility] Patient Name: Joe Green Procedure Date: 04/19/2021 10:44 AM MRN: [id number] Endoscopist: Ladene Artist , MD Age: 70 Referring MD:  Date of Birth: 03-15-1951 Gender: Male Account #: [id number] Procedure:                Colonoscopy Indications:              Surveillance: Personal history of adenomatous                            polyps on last colonoscopy > 5 years ago Medicines:                Monitored Anesthesia Care Procedure:                Pre-Anesthesia Assessment:                           - Prior to the procedure, a History and Physical                            was performed, and patient medications and                            allergies were reviewed. The patient's tolerance of                            previous anesthesia was also reviewed. The risks                            and benefits of the procedure and the sedation                            options and risks were discussed with the patient.                            All questions were answered, and informed consent                            was obtained. Prior Anticoagulants: The patient has                            taken no previous anticoagulant or antiplatelet                            agents. ASA Grade Assessment: III - A patient with                            severe systemic disease. After reviewing the risks                            and benefits, the patient was deemed in                            satisfactory condition to undergo the procedure.  After obtaining informed consent, the colonoscope                            was passed under direct vision. Throughout the                            procedure, the patient's blood pressure, pulse, and                            oxygen saturations were monitored continuously. The                            Olympus CF-HQ190L (Serial# [id number]) Colonoscope was                            introduced through  the anus and advanced to the the                            cecum, identified by appendiceal orifice and                            ileocecal valve. The ileocecal valve, appendiceal                            orifice, and rectum were photographed. The quality                            of the bowel preparation was adequate after                            extensive lavage, suction. The colonoscopy was                            performed without difficulty. The patient tolerated                            the procedure well. Scope In: 10:55:07 AM Scope Out: 11:18:25 AM Scope Withdrawal Time: 0 hours 19 minutes 38 seconds  Total Procedure Duration: 0 hours 23 minutes 18 seconds  Findings:                 The perianal and digital rectal examinations were                            normal.                           Five sessile polyps were found in the rectum (1),                            descending colon (2) and transverse colon (2). The                            polyps were 5 to 8 mm in size. These polyps were  removed with a cold snare. Resection and retrieval                            were complete.                           Multiple medium-mouthed diverticula were found in                            the left colon. There was no evidence of                            diverticular bleeding.                           Internal hemorrhoids were found during                            retroflexion. The hemorrhoids were small and Grade                            I (internal hemorrhoids that do not prolapse).                           The exam was otherwise without abnormality on                            direct and retroflexion views. Complications:            No immediate complications. Estimated blood loss:                            None. Estimated Blood Loss:     Estimated blood loss: none. Impression:               - Five 5 to 8 mm polyps in the rectum, in  the                            descending colon and in the transverse colon,                            removed with a cold snare. Resected and retrieved.                           - Moderate diverticulosis in the left colon.                           - Internal hemorrhoids.                           - The examination was otherwise normal on direct                            and retroflexion views. Recommendation:           - Repeat colonoscopy, likely 3 years, after studies  are complete for surveillance based on pathology                            results with a more extensive bowel prep.                           - Patient has a contact number available for                            emergencies. The signs and symptoms of potential                            delayed complications were discussed with the                            patient. Return to normal activities tomorrow.                            Written discharge instructions were provided to the                            patient.                           - High fiber diet.                           - Continue present medications.                           - Await pathology results. Ladene Artist, MD 04/19/2021 11:25:01 AM This report has been signed electronically.

## 2021-04-19 NOTE — Progress Notes (Signed)
Called to room to assist during endoscopic procedure.  Patient ID and intended procedure confirmed with present staff. Received instructions for my participation in the procedure from the performing physician.  

## 2021-04-19 NOTE — Progress Notes (Signed)
  See 03/29/2021 H&P, no changes.  For surveillance colonoscopy today d/t a personal history of adenomatous colon polyps.  This patient is appropriate for endoscopic procedures in the ambulatory setting.

## 2021-04-19 NOTE — Patient Instructions (Signed)
Resume previous diet and medications. Awaiting pathology results. Repeat Colonoscopy for surveillance based on pathology results.  YOU HAD AN ENDOSCOPIC PROCEDURE TODAY AT [facility]:   Refer to the procedure report that was given to you for any specific questions about what was found during the examination.  If the procedure report does not answer your questions, please call your gastroenterologist to clarify.  If you requested that your care partner not be given the details of your procedure findings, then the procedure report has been included in a sealed envelope for you to review at your convenience later.  YOU SHOULD EXPECT: Some feelings of bloating in the abdomen. Passage of more gas than usual.  Walking can help get rid of the air that was put into your GI tract during the procedure and reduce the bloating. If you had a lower endoscopy (such as a colonoscopy or flexible sigmoidoscopy) you may notice spotting of blood in your stool or on the toilet paper. If you underwent a bowel prep for your procedure, you may not have a normal bowel movement for a few days.  Please Note:  You might notice some irritation and congestion in your nose or some drainage.  This is from the oxygen used during your procedure.  There is no need for concern and it should clear up in a day or so.  SYMPTOMS TO REPORT IMMEDIATELY:  Following lower endoscopy (colonoscopy or flexible sigmoidoscopy):  Excessive amounts of blood in the stool  Significant tenderness or worsening of abdominal pains  Swelling of the abdomen that is new, acute  Fever of 100F or higher  For urgent or emergent issues, a gastroenterologist can be reached at any hour by calling [phone]. Do not use MyChart messaging for urgent concerns.    DIET:  We do recommend a small meal at first, but then you may proceed to your regular diet.  Drink plenty of fluids but you should avoid alcoholic beverages for 24  hours.  ACTIVITY:  You should plan to take it easy for the rest of today and you should NOT DRIVE or use heavy machinery until tomorrow (because of the sedation medicines used during the test).    FOLLOW UP: Our staff will call the number listed on your records 48-72 hours following your procedure to check on you and address any questions or concerns that you may have regarding the information given to you following your procedure. If we do not reach you, we will leave a message.  We will attempt to reach you two times.  During this call, we will ask if you have developed any symptoms of COVID 19. If you develop any symptoms (ie: fever, flu-like symptoms, shortness of breath, cough etc.) before then, please call [phone].  If you test positive for Covid 19 in the 2 weeks post procedure, please call and report this information to [place].    If any biopsies were taken you will be contacted by phone or by letter within the next 1-3 weeks.  Please call us at [phone] if you have not heard about the biopsies in 3 weeks.    SIGNATURES/CONFIDENTIALITY: You and/or your care partner have signed paperwork which will be entered into your electronic medical record.  These signatures attest to the fact that that the information above on your After Visit Summary has been reviewed and is understood.  Full responsibility of the confidentiality of this discharge information lies with you and/or your care-partner.

## 2021-04-19 NOTE — Progress Notes (Signed)
Pt Drowsy. VSS. To PACU, report to RN. No anesthetic complications noted.  

## 2021-04-21 ENCOUNTER — Telehealth: Payer: Self-pay | Admitting: *Deleted

## 2021-04-21 NOTE — Telephone Encounter (Signed)
  Follow up Call-  Call back number 04/19/2021  Post procedure Call Back phone  # [phone]  Permission to leave phone message Yes  Some recent data might be hidden     Patient questions:  Do you have a fever, pain , or abdominal swelling? No. Pain Score  0 *  Have you tolerated food without any problems? Yes.    Have you been able to return to your normal activities? Yes.    Do you have any questions about your discharge instructions: Diet   No. Medications  No. Follow up visit  No.  Do you have questions or concerns about your Care? No.  Actions: * If pain score is 4 or above: No action needed, pain <4.  Have you developed a fever since your procedure? no  2.   Have you had an respiratory symptoms (SOB or cough) since your procedure? no  3.   Have you tested positive for COVID 19 since your procedure no  4.   Have you had any family members/close contacts diagnosed with the COVID 19 since your procedure?  no   If yes to any of these questions please route to Joylene John, RN and Joella Prince, RN

## 2021-04-21 NOTE — Telephone Encounter (Signed)
First follow up call attempt.  LVM. 

## 2021-05-02 ENCOUNTER — Encounter: Payer: Self-pay | Admitting: Nurse Practitioner

## 2021-05-02 LAB — MYOMARKER 3 PLUS PROFILE (RDL)

## 2021-05-03 ENCOUNTER — Encounter: Payer: Self-pay | Admitting: Gastroenterology

## 2021-05-04 ENCOUNTER — Ambulatory Visit
Admission: RE | Admit: 2021-05-04 | Discharge: 2021-05-04 | Disposition: A | Discharge status: Home / Self Care | Payer: Medicare Other | Source: Ambulatory Visit | Attending: Pulmonary Disease | Admitting: Pulmonary Disease

## 2021-05-04 DIAGNOSIS — J849 Interstitial pulmonary disease, unspecified: Secondary | ICD-10-CM | POA: Diagnosis not present

## 2021-05-04 DIAGNOSIS — I700 Atherosclerosis of aorta: Secondary | ICD-10-CM | POA: Diagnosis not present

## 2021-05-04 DIAGNOSIS — J439 Emphysema, unspecified: Secondary | ICD-10-CM | POA: Diagnosis not present

## 2021-05-13 DIAGNOSIS — R1031 Right lower quadrant pain: Secondary | ICD-10-CM | POA: Diagnosis not present

## 2021-05-19 ENCOUNTER — Other Ambulatory Visit: Payer: Self-pay | Admitting: Surgery

## 2021-05-19 DIAGNOSIS — R1031 Right lower quadrant pain: Secondary | ICD-10-CM

## 2021-05-24 ENCOUNTER — Other Ambulatory Visit: Payer: Self-pay

## 2021-05-24 ENCOUNTER — Ambulatory Visit: Payer: Medicare Other | Admitting: Pulmonary Disease

## 2021-05-24 ENCOUNTER — Ambulatory Visit: Payer: Self-pay | Admitting: Pulmonary Disease

## 2021-05-24 ENCOUNTER — Encounter: Payer: Self-pay | Admitting: Pulmonary Disease

## 2021-05-24 ENCOUNTER — Ambulatory Visit (INDEPENDENT_AMBULATORY_CARE_PROVIDER_SITE_OTHER): Payer: Medicare Other | Admitting: Pulmonary Disease

## 2021-05-24 VITALS — BP 124/64 | HR 70 | Ht 69.8 in | Wt 193.4 lb

## 2021-05-24 DIAGNOSIS — J849 Interstitial pulmonary disease, unspecified: Secondary | ICD-10-CM

## 2021-05-24 LAB — PULMONARY FUNCTION TEST
DL/VA % pred: 77 %
DL/VA: 3.15 ml/min/mmHg/L
DLCO cor % pred: 77 %
DLCO cor: 19.86 ml/min/mmHg
DLCO unc % pred: 77 %
DLCO unc: 19.86 ml/min/mmHg
FEF 25-75 Post: 2.17 L/sec
FEF 25-75 Pre: 1.85 L/sec
FEF2575-%Change-Post: 17 %
FEF2575-%Pred-Post: 87 %
FEF2575-%Pred-Pre: 74 %
FEV1-%Change-Post: 4 %
FEV1-%Pred-Post: 94 %
FEV1-%Pred-Pre: 89 %
FEV1-Post: 3.05 L
FEV1-Pre: 2.9 L
FEV1FVC-%Change-Post: 2 %
FEV1FVC-%Pred-Pre: 94 %
FEV6-%Change-Post: 2 %
FEV6-%Pred-Post: 100 %
FEV6-%Pred-Pre: 98 %
FEV6-Post: 4.16 L
FEV6-Pre: 4.07 L
FEV6FVC-%Change-Post: 0 %
FEV6FVC-%Pred-Post: 103 %
FEV6FVC-%Pred-Pre: 103 %
FVC-%Change-Post: 2 %
FVC-%Pred-Post: 96 %
FVC-%Pred-Pre: 94 %
FVC-Post: 4.24 L
FVC-Pre: 4.15 L
Post FEV1/FVC ratio: 72 %
Post FEV6/FVC ratio: 98 %
Pre FEV1/FVC ratio: 70 %
Pre FEV6/FVC Ratio: 98 %
RV % pred: 117 %
RV: 2.81 L
TLC % pred: 97 %
TLC: 6.77 L

## 2021-05-24 NOTE — Progress Notes (Signed)
PFT done today. 

## 2021-05-24 NOTE — Progress Notes (Addendum)
Joe Green    [id number]    11/09/1950  Primary Care Physician:McKeown, Gwyndolyn Saxon, MD  Referring Physician: [name], [place],  [place] [address]  Chief complaint: Follow-up for abnormal CT, ILD evaluation  HPI: 70 year old with history of diabetes, sleep apnea referred for evaluation of abnormal CT with concern for ILD He has chronic cough with mild dyspnea on exertion.  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  Denies any exposures Smoking history: 40-pack-year smoker.  Continues to smoke half pack per day Travel history: No significant travel history Relevant family history: No family history of lung disease  Interim history: Discussed at ILD conference today 10/11.  CT findings are felt to be alternate pattern due to predominant nature of groundglass opacities over fibrotic changes and no clear honeycombing.  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   aspirin EC 81 MG tablet Take 1 tablet (81 mg total) by mouth daily.   benzonatate (TESSALON) 200 MG capsule Take 1 capsule  3 x /day PRN to Prevent Cough   blood glucose meter kit and supplies Dispense based on patient and insurance preference. Use up to four times daily as directed. (FOR ICD-9 250.00, 250.01).   Cholecalciferol (VITAMIN D) 50 MCG (2000 UT) tablet Take 1 tablet (2,000 Units total) by mouth daily.   doxazosin (CARDURA) 8 MG tablet Take  1 tablet  at Bedtime  for BP & Prostate   Dulaglutide ([name]) 1.5 CB/6.3AG SOPN Inject 1.5 mg into the skin  once a week.   fexofenadine (ALLEGRA) 60 MG tablet Take 60 mg by mouth daily as needed for allergies or rhinitis.   finasteride (PROSCAR) 5 MG tablet Take 1 tablet daily for prostate.   fluticasone furoate-vilanterol (BREO ELLIPTA) 100-25 MCG/INH AEPB INHALE 1 PUFF BY MOUTH ONCE DAILY. RINSE MOUTH WITH WATER AFTER EACH USE.   glimepiride (AMARYL) 4 MG tablet TAKE 1/2-1 TABLET BY MOUTH TWICE DAILY WITH MEALS FOR DIABETES   glucose blood test strip Test blood sugars once daily.Dx: E11.9   metFORMIN (GLUCOPHAGE) 1000 MG tablet Take  1 tablet  2 x /day with Meals  for Diabetes  / Patient knows to take by mouth   ramipril (ALTACE) 5 MG capsule Take 1 capsule   Daily  For BP  & Diabetic Kidney Protection (Dx: e11.29)   rOPINIRole (REQUIP) 3 MG tablet Take one tablet three times a day for restless legs.   rosuvastatin (CRESTOR) 20 MG tablet Take 1 tab three nights a week for cholesterol.   [DISCONTINUED] meloxicam (MOBIC) 15 MG tablet Take one daily with food for 2 weeks, can take with tylenol, can not take with aleve, iburpofen, then as needed daily for pain   Facility-Administered Encounter Medications as of 05/24/2021  Medication   0.9 %  sodium chloride infusion    Physical Exam: Blood pressure 124/64, pulse 70, height 5' 9.8" (1.773 m), weight 193 lb 6.4 oz (87.7 kg), SpO2 98 %. Gen:      No acute distress HEENT:  EOMI, sclera anicteric Neck:     No  masses; no thyromegaly Lungs:    Clear to auscultation bilaterally; normal respiratory effort CV:         Regular rate and rhythm; no murmurs Abd:      + bowel sounds; soft, non-tender; no palpable masses, no distension Ext:    No edema; adequate peripheral perfusion Skin:      Warm and dry; no rash Neuro: alert and oriented x 3 Psych: normal mood and affect   Data Reviewed: Imaging: Screening CT chest 12/15/2016-centrilobular and paraseptal emphysema.  Minimal subpleural reticulation. Screening CT chest 02/18/2021-centrilobular and paraseptal  emphysema, bibasal subpleural reticulation and groundglass with honeycombing.  High-resolution CT 05/04/2021-peripheral and basilar predominant groundglass with minimal subpleural reticulation, alternate diagnosis.  I have reviewed the images personally.  PFTs: 05/20/2021 FVC 4.24 [96%], FEV1 3.05 [94%], F/F 72, TLC 6.77 [97%], DLCO 19.86 [77%] Minimal diffusion defect  Labs: CTD serologies 03/22/21-significant only for rheumatoid factor of 18  Assessment:  Interstitial lung disease, pulmonary fibrosis CT scan reviewed at ILD conference and findings is consistent with alternate diagnosis as there is predominant groundglass with no honeycombing.  See discussion above  We discussed options including biopsy with either bronchoscopy in this ER or surgical lung biopsy, watchful waiting or empiric treatment with antifibrotics.  Since his breathing is doing well he would like to avoid any intervention at present and prefers conservative management with monitoring Follow-up in 6 months  Active smoker Discussed smoking cessation.  Offered referral to cessation clinic but he wants to quit on his own Time spent counseling-5 minutes.  Reassess at return visit  Plan/Recommendations: Conservative management Follow-up in 6 months  Marshell Garfinkel MD [facility] 05/24/2021, 12:21 PM  CC: [name], MD

## 2021-05-24 NOTE — Patient Instructions (Signed)
I have reviewed your CT and PFTs which show mild scarring in the lung Per our discussion today we will continue to monitor this Work on smoking cessation Follow-up in 6 months.

## 2021-05-25 ENCOUNTER — Telehealth: Payer: Self-pay | Admitting: Pulmonary Disease

## 2021-05-25 DIAGNOSIS — J849 Interstitial pulmonary disease, unspecified: Secondary | ICD-10-CM

## 2021-05-25 NOTE — Telephone Encounter (Signed)
LMTCB   PM please advise patient would like to move forward with biopsy. Would you like [place] to place order? Thanks

## 2021-05-31 NOTE — Telephone Encounter (Signed)
Left message for patient to call back x2.  

## 2021-06-02 NOTE — Telephone Encounter (Signed)
Patient is returning phone call. Patient phone number is [phone]. May leave detailed message on voicemail.

## 2021-06-02 NOTE — Telephone Encounter (Signed)
Called and spoke with Patient.  Patient stated after thinking over and discussing options, Patient would like to go forward with biopsy.  Patient would like to discuss with Dr. Vaughan Browner next steps.   Message routed to Dr. Vaughan Browner

## 2021-06-03 NOTE — Telephone Encounter (Signed)
I called and discussed with the patient. Please make a referral to cardiothoracic surgery for surgical lung biopsy for evaluation of ILD.

## 2021-06-06 NOTE — Telephone Encounter (Signed)
Order for cardiothoracic surgery placed. Nothing further at this time.

## 2021-06-09 ENCOUNTER — Other Ambulatory Visit: Payer: Self-pay

## 2021-06-09 ENCOUNTER — Ambulatory Visit
Admission: RE | Admit: 2021-06-09 | Discharge: 2021-06-09 | Disposition: A | Discharge status: Home / Self Care | Payer: Medicare Other | Source: Ambulatory Visit | Attending: Surgery | Admitting: Surgery

## 2021-06-09 DIAGNOSIS — K5730 Diverticulosis of large intestine without perforation or abscess without bleeding: Secondary | ICD-10-CM | POA: Diagnosis not present

## 2021-06-09 DIAGNOSIS — K8020 Calculus of gallbladder without cholecystitis without obstruction: Secondary | ICD-10-CM | POA: Diagnosis not present

## 2021-06-09 DIAGNOSIS — R1031 Right lower quadrant pain: Secondary | ICD-10-CM

## 2021-06-09 DIAGNOSIS — N200 Calculus of kidney: Secondary | ICD-10-CM | POA: Diagnosis not present

## 2021-06-16 ENCOUNTER — Other Ambulatory Visit: Payer: Self-pay | Admitting: Adult Health

## 2021-06-21 ENCOUNTER — Other Ambulatory Visit: Payer: Self-pay

## 2021-06-21 ENCOUNTER — Institutional Professional Consult (permissible substitution): Payer: Medicare Other | Admitting: Thoracic Surgery (Cardiothoracic Vascular Surgery)

## 2021-06-21 ENCOUNTER — Encounter: Payer: Self-pay | Admitting: Thoracic Surgery (Cardiothoracic Vascular Surgery)

## 2021-06-21 ENCOUNTER — Telehealth: Payer: Self-pay | Admitting: Internal Medicine

## 2021-06-21 VITALS — BP 152/68 | HR 76 | Resp 20 | Ht 69.8 in | Wt 205.4 lb

## 2021-06-21 DIAGNOSIS — J849 Interstitial pulmonary disease, unspecified: Secondary | ICD-10-CM | POA: Diagnosis not present

## 2021-06-21 NOTE — Progress Notes (Signed)
PCP is [name], MD Referring Provider is Marshell Garfinkel, MD  Chief Complaint  Patient presents with   Interstitial Lung Disease    New patient consultation PFTs 10/11, Chest CT 9/21,     HPI: Joe Green sent for consideration of lung biopsy.  [name] is a 70 year old gentleman with a history of tobacco abuse, emphysema, CAD, hypertension, hyperlipidemia, type 2 diabetes, obstructive sleep apnea, obesity, and reflux.  He has been followed in the low-dose lung cancer screening program due to his smoking history.  In July he had a CT.  It showed no suspicious lung nodules but there was suspicion for ILD.  He then was referred to Dr. Vaughan Browner and had a high-resolution CT in September.  It showed a parenchymal pattern of peripheral and basilar predominant groundglass with minimal subpleural reticulation.  Not consistent with UIP.  He discussed the scan with Dr. Vaughan Browner and the initial plan was to repeat a scan in 6 months.  However, after thinking about it a while he thought maybe he should go ahead and have a biopsy.  He denies any chest pain, pressure, or tightness.  He is not having any significant problems with his breathing currently.  He is on [employer].  He has a 40-pack-year history of smoking.  He says he recently quit.  That would be within the last 2 weeks.  Zubrod Score: At the time of surgery this patient's most appropriate activity status/level should be described as: [x]     0    Normal activity, no symptoms []     1    Restricted in physical strenuous activity but ambulatory, able to do out light work []     2    Ambulatory and capable of self care, unable to do work activities, up and about >50 % of waking hours                              []     3    Only limited self care, in bed greater than 50% of waking hours []     4    Completely disabled, no self care, confined to bed or chair []     5    Moribund  Past Medical History:  Diagnosis Date   CAD (coronary  artery disease)    a. s/p stent to RCA 2005; b. LHC 2006 after Nuc with inf ischemia: patent RCA stent, D1 80%, LAD 60% => med Rx;  c. LHC 04/18/11: Proximal LAD 60% (unchanged from previous catheterization), distal LAD 65% (small caliber), Dx 60%, proximal CFX 20%, mid CFX 50%, OM1 30%, proximal RCA 20%, mid RCA stent patent, EF 65%.=> med Rx   GERD (gastroesophageal reflux disease)    HLD (hyperlipidemia)    Hypertension    Obesity    OSA (obstructive sleep apnea)    has a cpap-does not use-has a special mouthpiece he wears at night   Type II or unspecified type diabetes mellitus without mention of complication, not stated as uncontrolled    Vitamin D deficiency    Wears glasses     Past Surgical History:  Procedure Laterality Date   CARDIAC CATHETERIZATION  2005   stent   CHONDROPLASTY  06/25/2017   Procedure: CHONDROPLASTY;  Surgeon: Dorna Leitz, MD;  Location: [facility];  Service: Orthopedics;;   COLONOSCOPY  11/2011   Due 5 years Dr. Fuller Plan   heart stent  2005   RCA  INGUINAL HERNIA REPAIR Right 11/16/2014   Procedure: RIGHT INGUINAL HERNIA REPAIR WITH MESH;  Surgeon: Armandina Gemma, MD;  Location: [facility];  Service: General;  Laterality: Right;   INSERTION OF MESH Right 11/16/2014   Procedure: INSERTION OF MESH;  Surgeon: Armandina Gemma, MD;  Location: [facility];  Service: General;  Laterality: Right;   KNEE ARTHROSCOPY WITH MEDIAL MENISECTOMY Bilateral 06/25/2017   Procedure: right knee arthroscopy with medial meniscectomy, chondroplasty, Left knee arthroscopy with medial menisectomy removal of plica and chondroplasty;  Surgeon: Dorna Leitz, MD;  Location: [facility];  Service: Orthopedics;  Laterality: Bilateral;   POLYPECTOMY      Family History  Problem Relation Age of Onset   Cancer Mother        vaginal?    Prostate cancer Father    Hypertension Father    Kidney cancer Maternal Grandmother    Colon cancer Neg  Hx    Colon polyps Neg Hx    Esophageal cancer Neg Hx    Rectal cancer Neg Hx    Stomach cancer Neg Hx     Social History Social History   Tobacco Use   Smoking status: Former    Packs/day: 0.50    Years: 51.00    Pack years: 25.50    Types: Cigarettes    Start date: 1970   Smokeless tobacco: Never  Vaping Use   Vaping Use: Never used  Substance Use Topics   Alcohol use: Yes    Alcohol/week: 14.0 standard drinks    Types: 14 Cans of beer per week   Drug use: No    Current Outpatient Medications  Medication Sig Dispense Refill   acetaminophen (TYLENOL) 500 MG tablet Take 2 tablets (1,000 mg total) by mouth every 6 (six) hours. 30 tablet 0   aspirin EC 81 MG tablet Take 1 tablet (81 mg total) by mouth daily. 90 tablet 3   benzonatate (TESSALON) 200 MG capsule TAKE 1 CAPSULE BY MOUTH THREE TIMES DAILY AS NEEDED TO  PREVENT  COUGH 90 capsule 0   blood glucose meter kit and supplies Dispense based on patient and insurance preference. Use up to four times daily as directed. (FOR ICD-9 250.00, 250.01). 1 each 0   Cholecalciferol (VITAMIN D) 50 MCG (2000 UT) tablet Take 1 tablet (2,000 Units total) by mouth daily.     doxazosin (CARDURA) 8 MG tablet Take  1 tablet  at Bedtime  for BP & Prostate 90 tablet 3   Dulaglutide ([name]) 1.5 IW/5.8KD SOPN Inject 1.5 mg into the skin once a week. 8 mL 3   fexofenadine (ALLEGRA) 60 MG tablet Take 60 mg by mouth daily as needed for allergies or rhinitis.     finasteride (PROSCAR) 5 MG tablet Take 1 tablet daily for prostate. 90 tablet 1   fluticasone furoate-vilanterol (BREO ELLIPTA) 100-25 MCG/ACT AEPB INHALE 1 PUFF BY MOUTH ONCE DAILY RINSE  MOUTH  WITH  WATER  AFTER  EACH  USE 60 each 0   glimepiride (AMARYL) 4 MG tablet TAKE 1/2-1 TABLET BY MOUTH TWICE DAILY WITH MEALS FOR DIABETES 180 tablet 1   glucose blood test strip Test blood sugars once daily.Dx: E11.9 100 each 2   metFORMIN (GLUCOPHAGE) 1000 MG tablet Take  1 tablet  2 x /day with  Meals  for Diabetes  / Patient knows to take by mouth 180 tablet 3   ramipril (ALTACE) 5 MG capsule Take 1 capsule   Daily  For  BP  & Diabetic Kidney Protection (Dx: e11.29) 90 capsule 3   rOPINIRole (REQUIP) 3 MG tablet Take one tablet three times a day for restless legs. 270 tablet 1   rosuvastatin (CRESTOR) 20 MG tablet Take 1 tab three nights a week for cholesterol. 90 tablet 1   Current Facility-Administered Medications  Medication Dose Route Frequency Provider Last Rate Last Admin   0.9 %  sodium chloride infusion  500 mL Intravenous Continuous Ladene Artist, MD        No Known Allergies  Review of Systems  Constitutional:  Negative for activity change and unexpected weight change.  HENT:  Negative for trouble swallowing and voice change.   Respiratory:  Positive for cough. Negative for shortness of breath and wheezing.   Cardiovascular:  Negative for chest pain and leg swelling.  Genitourinary:  Positive for frequency.       Enlarged prostate  Musculoskeletal:  Positive for arthralgias and joint swelling.  Hematological:  Bruises/bleeds easily.  All other systems reviewed and are negative.  BP (!) 152/68 (BP Location: Right Arm, Patient Position: Sitting, Cuff Size: Normal)   Pulse 76   Resp 20   Ht 5' 9.8" (1.773 m)   Wt 205 lb 6.4 oz (93.2 kg)   SpO2 94% Comment: RA  BMI 29.64 kg/m  Physical Exam Vitals reviewed.  Constitutional:      General: He is not in acute distress.    Appearance: Normal appearance.  HENT:     Head: Normocephalic and atraumatic.  Eyes:     General: No scleral icterus.    Extraocular Movements: Extraocular movements intact.  Neck:     Vascular: No carotid bruit.  Cardiovascular:     Rate and Rhythm: Normal rate and regular rhythm.     Heart sounds: Normal heart sounds. No murmur heard. Pulmonary:     Effort: Pulmonary effort is normal.     Breath sounds: Normal breath sounds. No rales.  Abdominal:     General: There is no distension.      Palpations: Abdomen is soft.  Lymphadenopathy:     Cervical: No cervical adenopathy.  Skin:    General: Skin is warm and dry.  Neurological:     General: No focal deficit present.     Mental Status: He is alert and oriented to person, place, and time.     Cranial Nerves: No cranial nerve deficit.     Motor: No weakness.    Diagnostic Tests: CT CHEST WITHOUT CONTRAST   TECHNIQUE: Multidetector CT imaging of the chest was performed following the standard protocol without intravenous contrast. High resolution imaging of the lungs, as well as inspiratory and expiratory imaging, was performed.   COMPARISON:  02/18/2021, 02/17/2020, 01/28/2018.   FINDINGS: Cardiovascular: Atherosclerotic calcification of the aorta and coronary arteries. Heart size normal. No pericardial effusion.   Mediastinum/Nodes: Mediastinal lymph nodes are not enlarged by CT size criteria. Hilar regions are difficult to definitively evaluate without IV contrast but appear grossly unremarkable. No axillary adenopathy. Esophagus is grossly unremarkable.   Lungs/Pleura: Centrilobular and paraseptal emphysema. Mild peripheral and basilar superimposed ground-glass. Very minimal subpleural reticular densities. There may be minimal traction bronchiolectasis. No honeycombing or architectural distortion. Findings are similar to 02/17/2020. No air trapping. No pleural fluid. Airway is unremarkable.   Upper Abdomen: Visualized portion of the liver is unremarkable. Stones in the gallbladder. Slight nodularity of the adrenal glands. Subcentimeter low-attenuation lesion in the upper pole right kidney, too small to characterize. Visualized  portions of the kidneys, spleen, pancreas, stomach and bowel are otherwise unremarkable. Incidental duodenal diverticulum.   Musculoskeletal: Degenerative changes in the spine. Old left rib fractures. No worrisome lytic or sclerotic lesions.   IMPRESSION: 1. Pulmonary  parenchymal pattern of peripheral and basilar predominant ground-glass with very minimal subpleural reticulation and traction bronchiolectasis, similar to 02/17/2020. Findings may be due to nonspecific interstitial pneumonitis. Findings are suggestive of an alternative diagnosis (not UIP) per consensus guidelines: Diagnosis of Idiopathic Pulmonary Fibrosis: An Official ATS/ERS/JRS/ALAT Clinical Practice Guideline. [place], Iss 5, [id number], Apr 14 2017. 2. Cholelithiasis. 3. Aortic atherosclerosis (ICD10-I70.0). Coronary artery calcification. 4.  Emphysema (ICD10-J43.9).     Electronically Signed   By: Lorin Picket M.D.   On: 05/05/2021 11:08 I personally reviewed the CT images.  There are groundglass opacities suggestive of ILD.  Impression: Joe Green is a 70 year old gentleman with a history of tobacco abuse, emphysema, CAD, hypertension, hyperlipidemia, type 2 diabetes, obstructive sleep apnea, obesity, and reflux.  He was found to have evidence of ILD on a low-dose CT for lung cancer screening.  He then had a high-resolution CT which confirmed those findings.  After his initial discussion with Dr. Vaughan Browner he planned to have a follow-up scan in 6 months.  After further consideration he thought he might want to have a surgical lung biopsy.  I reviewed the CT images with Mr. Tipler.  We discussed the proposed surgical procedure robotic assisted right VATS for lung biopsy.  He understands the general nature of the procedure including the need for general anesthesia, the incisions to be used, the use of a drainage tube postoperatively, the expected hospital stay, and the overall recovery.  I informed him of the indications, risks, benefits, and alternatives.  He understands the risks include, but not limited to death, MI, DVT, PE, bleeding, possible need for transfusion, infection, prolonged air leak, cardiac arrhythmias, as well as the possibility of other  unforeseeable complications.  At this point he is undecided thinks he may prefer to wait and have another scan in 6 months.  Plan: Follow-up with Dr. Vaughan Browner. He will call the office if he chooses to proceed with lung biopsy.  Melrose Nakayama, MD [facility] [phone]

## 2021-06-21 NOTE — Progress Notes (Signed)
  Chronic Care Management   Outreach Note  06/21/2021 Name: Joe Green MRN: [id number] DOB: [birth year]  Referred by: [name], MD Reason for referral : No chief complaint on file.   An unsuccessful telephone outreach was attempted today. The patient was referred to the pharmacist for assistance with care management and care coordination.   Follow Up Plan:   Tatjana Dellinger Upstream Scheduler

## 2021-06-27 ENCOUNTER — Telehealth: Payer: Self-pay | Admitting: Internal Medicine

## 2021-06-27 NOTE — Progress Notes (Signed)
  Chronic Care Management   Note  06/27/2021 Name: SAHAJ BONA MRN: [id number] DOB: 01-10-1951  VIRGINIA FRANCISCO is a 69 y.o. year old male who is a primary care patient of [name], MD. I reached out to Chinita Greenland by phone today in response to a referral sent by Mr. Mats Jeanlouis Depaz's PCP, [name], MD.   Mr. Birdwell was given information about Chronic Care Management services today including:  CCM service includes personalized support from designated clinical staff supervised by his physician, including individualized plan of care and coordination with other care providers 24/7 contact phone numbers for assistance for urgent and routine care needs. Service will only be billed when office clinical staff spend 20 minutes or more in a month to coordinate care. Only one practitioner may furnish and bill the service in a calendar month. The patient may stop CCM services at any time (effective at the end of the month) by phone call to the office staff.   Patient agreed to services and verbal consent obtained.   Follow up plan:   Tatjana Secretary/administrator

## 2021-06-28 NOTE — Progress Notes (Signed)
Complete Physical  Assessment and Plan:   Encounter for general adult medical examination with abnormal findings Due Yearly  Essential hypertension Continue medications Monitor blood pressure at home; call if consistently over 130/80 Continue DASH diet.   Reminder to go to the ER if any CP, SOB, nausea, dizziness, severe HA, changes vision/speech, left arm numbness and tingling and jaw pain. EKG CBC Routine UA with reflex microscopic Microalbumin/ creatinine urine ratio  Coronary artery disease involving native heart  Control blood pressure, cholesterol, glucose, increase exercise.  Denies angina; normal myoview 07/2019 per Dr. Johnsie Cancel Continue cardio follow up Stop smoking  OSA and COPD overlap syndrome ([place]) Is looking into getting new mouthpiece, does not use CPAP  Chronic obstructive pulmonary disease, unspecified COPD type ([place]) Continue inhalers, stop smoking, has recent CT lung  Type 2 diabetes mellitus with other circulatory complication, without long-term current use of insulin [facility]) Education: Reviewed 'ABCs' of diabetes management (respective goals in parentheses):  A1C (<7), blood pressure (<130/80), and cholesterol (LDL <70) Eye Exam yearly and Dental Exam every 6 months - reminded due for annual diabetic eye exam - he will schedule with Dr. Katy Fitch Clarify whether glimepiride is 2 mg or 4 mg - plan to transition to 4 mg AM, then 2-4 mg PM Dietary recommendations Physical Activity recommendations Foot exam completed - normal However diminished post tib pulses - smoker - never had ABI - ordered today  A1c  Benign prostatic hyperplasia with urinary frequency Doxazosin and finasteride Followed by urology    Vitamin D deficiency Continue supplementation Check vitamin D level  Smoking Discussed risks associated with tobacco use and advised to reduce or quit Patient is ready to do so and plans to taper States has chantix at home that he plans to start -  instructions given  Will follow up at the next visit Strategies for success discussed.  Had chest CT 04/2021  RLS (restless legs syndrome) , increase daytime activity, check iron, try increase gabapentin - 200 mg at HS  Mixed hyperlipidemia Continue medications Continue low cholesterol diet and exercise.  Check lipid panel.  CMP TSH  Medication management CBC, CMP/GFR, UA, magnesium  Elevated PSA Check PSA Followed by urology   Overweight (BMI 25.0-29.9) Long discussion about weight loss, diet, and exercise Recommended diet heavy in fruits and veggies and low in animal meats, cheeses, and dairy products, appropriate calorie intake Discussed appropriate weight for height  Follow up at next visit  Aortic atherosclerosis ([place]) Control blood pressure, cholesterol, glucose, increase exercise.   B12 deficiency  Recheck levels; never started supplement; recommend subligual supplement to maintain levels 500+   Diminished pulses/screening vascular disease Smoker, T2DM, never ABI, screening ordered  Skin lesion of face Refer to skin surgery center for removal, appears to be 2 seborrheic keratosis above left temple  Flu Vaccine Need High dose flu vaccine given    Discussed med's effects and SE's. Screening labs and tests as requested with regular follow-up as recommended. Over 40 minutes of exam, counseling, chart review and critical decision making was performed  Future Appointments  Date Time Provider [place]  07/19/2021  8:15 AM Imogene Burn, PA-C [facility]  07/27/2021  9:00 AM [name], RPH [facility] None  03/29/2022 11:00 AM [name], NP [facility] None  06/29/2022 10:00 AM Magda Bernheim, NP [facility] None     HPI 70 y.o. male patient presents for a complete physical. He has Hyperlipidemia associated with type 2 diabetes mellitus ([place]); CAD (coronary artery disease);  Hypertension; OSA and COPD overlap syndrome ([place]);  Smoking; Vitamin D deficiency; Medication management; Type 2 diabetes mellitus (HCC); RLS (restless legs syndrome); COPD (chronic obstructive pulmonary disease) (HCC); BPH (benign prostatic hyperplasia); Elevated PSA; Chondromalacia, left knee; Chondromalacia, right knee; Overweight (BMI 25.0-29.9); Aortic atherosclerosis (HCC)by Chest CT scan 11/2016; Hepatic steatosis; B12 deficiency; History of colonic polyps; Excessive drinking alcohol; ILD (interstitial lung disease) ([place]); and Gallstones on their problem list.  He is married with 3 kids, 1 is his. 56 Grandchildren. Retired recently in 2021 from [employer], job of 33 years. He does find he misses his job at times.  Spending lots of time at the beach.    No concerns.    he is a smoker, cutting down, but admits smoking average 1/2 pack starting to decrease; estimated 50 pack year history; has chantix at home and plans to start this sometime soon.  He had Ct lung in 04/2021 which did not show any concerning nodules.  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    OSA, intolerant of CPAP and had been using mouth piece but he feels didn't work well; managed by Dr. Toy Cookey, has been recommended repeat sleep study but patient adamantly declines repeat. He is not using his CPAP    He has RLS, had been declining meds but having some trouble sleeping, Requip now but does not control symptoms completely  BMI is Body mass index is 28.98 kg/m., he has been working on diet and admits less active since retiring, but "not sitting." staying busy around the home.  He is 194 at home Admits to 2 beers a couple nights a week, 1 cup coffee daily  Generally watching diet  Wt Readings from Last 3 Encounters:  06/29/21 200 lb 12.8 oz (91.1 kg)  06/21/21 205 lb 6.4 oz (93.2 kg)  05/24/21 193 lb 6.4 oz (87.7 kg)   He has a history of CAD s/p PCI to RCA in 2005. He underwent LHC in 2006 and 2012 which showed a  proximal LAD 60% (unchanged from previous catheterization), Medical therapy recommended. Has done well since cath in 2012 and followed by Dr. Kyla Balzarine office, had normal follow up myoview in 07/2019.   His blood pressure has been controlled at home, today their BP is BP: 128/70 He does not workout.Marland Kitchen He denies chest pain, shortness of breath, dizziness.   He is on cholesterol medication (rosuvastastin 20 mg three days a week) and denies myalgias. His cholesterol is at goal. The cholesterol last visit was:   Lab Results  Component Value Date   CHOL 158 03/29/2021   HDL 42 03/29/2021   LDLCALC 98 03/29/2021   TRIG 86 03/29/2021   CHOLHDL 3.8 03/29/2021   He has been working on diet and exercise for diabetes (on metformin 1000 mg BID, glimepiride 4 mg at night and [name] 5.70 mg weekly ), he is on bASA, he is on ACE/ARB and denies foot ulcerations, increased appetite, nausea, paresthesia of the feet, polydipsia, polyuria, visual disturbances, vomiting and weight loss.  ABI - never He does check fasting sugars ranging 160-180 Last A1C in the office was:  Lab Results  Component Value Date   HGBA1C 7.1 (H) 03/29/2021   Last GFR: Lab Results  Component Value Date   GFRNONAA 97 10/28/2020    Patient is on Vitamin D supplement, 10000 IU daily    Lab Results  Component Value Date   VD25OH 91 10/28/2020  Has hx of elevated PSAs, BPH with LUTS (nocturia x 2 night) and is followed by [facility]. He is on doxazosin and finasteride.  Lab Results  Component Value Date   PSA 2.4 04/23/2020   PSA 3.3 03/10/2019   PSA 3.8 01/01/2018   B12 was low, admits didn't start supplement Lab Results  Component Value Date   VITAMINB12 220 03/29/2021     Current Medications:  Current Outpatient Medications on File Prior to Visit  Medication Sig Dispense Refill   acetaminophen (TYLENOL) 500 MG tablet Take 2 tablets (1,000 mg total) by mouth every 6 (six) hours. 30 tablet 0    aspirin EC 81 MG tablet Take 1 tablet (81 mg total) by mouth daily. 90 tablet 3   benzonatate (TESSALON) 200 MG capsule TAKE 1 CAPSULE BY MOUTH THREE TIMES DAILY AS NEEDED TO  PREVENT  COUGH 90 capsule 0   blood glucose meter kit and supplies Dispense based on patient and insurance preference. Use up to four times daily as directed. (FOR ICD-9 250.00, 250.01). 1 each 0   Cholecalciferol (VITAMIN D) 50 MCG (2000 UT) tablet Take 1 tablet (2,000 Units total) by mouth daily.     doxazosin (CARDURA) 8 MG tablet Take  1 tablet  at Bedtime  for BP & Prostate 90 tablet 3   Dulaglutide ([name]) 1.5 HW/8.6HU SOPN Inject 1.5 mg into the skin once a week. 8 mL 3   fexofenadine (ALLEGRA) 60 MG tablet Take 60 mg by mouth daily as needed for allergies or rhinitis.     finasteride (PROSCAR) 5 MG tablet Take 1 tablet daily for prostate. 90 tablet 1   fluticasone furoate-vilanterol (BREO ELLIPTA) 100-25 MCG/ACT AEPB INHALE 1 PUFF BY MOUTH ONCE DAILY RINSE  MOUTH  WITH  WATER  AFTER  EACH  USE 60 each 0   glimepiride (AMARYL) 4 MG tablet TAKE 1/2-1 TABLET BY MOUTH TWICE DAILY WITH MEALS FOR DIABETES 180 tablet 1   glucose blood test strip Test blood sugars once daily.Dx: E11.9 100 each 2   metFORMIN (GLUCOPHAGE) 1000 MG tablet Take  1 tablet  2 x /day with Meals  for Diabetes  / Patient knows to take by mouth 180 tablet 3   ramipril (ALTACE) 5 MG capsule Take 1 capsule   Daily  For BP  & Diabetic Kidney Protection (Dx: e11.29) 90 capsule 3   rOPINIRole (REQUIP) 3 MG tablet Take one tablet three times a day for restless legs. 270 tablet 1   rosuvastatin (CRESTOR) 20 MG tablet Take 1 tab three nights a week for cholesterol. 90 tablet 1   Current Facility-Administered Medications on File Prior to Visit  Medication Dose Route Frequency Provider Last Rate Last Admin   0.9 %  sodium chloride infusion  500 mL Intravenous Continuous Ladene Artist, MD       Allergies:  No Known Allergies Health Maintenance:   Immunization History  Administered Date(s) Administered   Influenza, High Dose Seasonal PF 06/22/2017, 08/03/2018, 05/15/2019, 07/28/2020   Influenza-Unspecified 06/02/2013, 08/18/2014   PFIZER(Purple Top)SARS-COV-2 Vaccination 11/19/2019, 12/10/2019   Pneumococcal Conjugate-13 11/21/2016   Pneumococcal Polysaccharide-23 08/27/2007, 01/01/2018   Td 01/01/2018   Tdap 08/27/2007   Zoster, Live 10/20/2013   Tetanus: 2019 Pneumovax: 2009, 2019 Prevnar 13: 2018 Flu vaccine: 05/2019 Zostavax: 2015 Covid 19: 2/2, 2021, pfizer- patient will send information for documenting    DEXA: n/a Colonoscopy:04/19/21 due 2025  EGD: -  CT lung:  04/2021- aortic atherosclerosis, emphysema   Stress  test: 07/2019  Names of Other Physician/Practitioners you currently use: 1. [facility] here for primary care 2. Dr. Katy Fitch, eye doctor, last visit 09/07/20 3. Dr. ?, dentist, last visit remote, had scheduled but needs to find new dentist due to insurance    Patient Care Team: [name], MD as PCP - General (Internal Medicine) Josue Hector, MD as Consulting Physician (Cardiology) Ladene Artist, MD as Consulting Physician (Gastroenterology) [name], [facility] as Pharmacist (Pharmacist)  Medical History:  has Hyperlipidemia associated with type 2 diabetes mellitus [facility]); CAD (coronary artery disease); Hypertension; OSA and COPD overlap syndrome ([place]); Smoking; Vitamin D deficiency; Medication management; Type 2 diabetes mellitus (HCC); RLS (restless legs syndrome); COPD (chronic obstructive pulmonary disease) (HCC); BPH (benign prostatic hyperplasia); Elevated PSA; Chondromalacia, left knee; Chondromalacia, right knee; Overweight (BMI 25.0-29.9); Aortic atherosclerosis (HCC)by Chest CT scan 11/2016; Hepatic steatosis; B12 deficiency; History of colonic polyps; Excessive drinking alcohol; ILD (interstitial lung disease) ([place]); and Gallstones on their problem  list. Surgical History:  He  has a past surgical history that includes heart stent (2005); Colonoscopy (11/2011); Polypectomy; Cardiac catheterization (2005); Inguinal hernia repair (Right, 11/16/2014); Insertion of mesh (Right, 11/16/2014); Knee arthroscopy with medial menisectomy (Bilateral, 06/25/2017); and Chondroplasty (06/25/2017). Family History:  His family history includes Cancer in his mother; Hypertension in his father; Kidney cancer in his maternal grandmother; Prostate cancer in his father. Social History:   reports that he has quit smoking. His smoking use included cigarettes. He started smoking about 52 years ago. He has a 25.50 pack-year smoking history. He has never used smokeless tobacco. He reports current alcohol use of about 14.0 standard drinks per week. He reports that he does not use drugs. Review of Systems:  Review of Systems  Constitutional:  Negative for chills, fever, malaise/fatigue and weight loss.  HENT:  Negative for congestion, hearing loss, sinus pain, sore throat and tinnitus.   Eyes:  Negative for blurred vision and double vision.  Respiratory:  Negative for cough, hemoptysis, sputum production, shortness of breath and wheezing.   Cardiovascular:  Negative for chest pain, palpitations, orthopnea, claudication, leg swelling and PND.  Gastrointestinal:  Negative for abdominal pain, blood in stool, constipation, diarrhea, heartburn, melena, nausea and vomiting.  Genitourinary: Negative.  Negative for dysuria and urgency.       Nocturia x 2  Musculoskeletal:  Negative for back pain, falls, joint pain, myalgias and neck pain.  Skin:  Negative for rash.  Neurological:  Negative for dizziness, tingling, tremors, sensory change, weakness and headaches.  Endo/Heme/Allergies:  Positive for environmental allergies. Negative for polydipsia. Does not bruise/bleed easily.  Psychiatric/Behavioral:  Negative for depression, memory loss, substance abuse and suicidal ideas. The  patient has insomnia (intermittent with RLS symptoms). The patient is not nervous/anxious.   All other systems reviewed and are negative.  Physical Exam: Estimated body mass index is 28.98 kg/m as calculated from the following:   Height as of this encounter: 5' 9.8" (1.773 m).   Weight as of this encounter: 200 lb 12.8 oz (91.1 kg). BP 128/70   Pulse 88   Temp 97.9 F (36.6 C)   Resp 16   Ht 5' 9.8" (1.773 m)   Wt 200 lb 12.8 oz (91.1 kg)   SpO2 97%   BMI 28.98 kg/m  General Appearance: Well nourished, in no apparent distress.  Eyes: PERRLA, EOMs, conjunctiva no swelling or erythema, normal fundi and vessels.  Sinuses: No Frontal/maxillary tenderness  ENT/Mouth: Ext aud canals clear,  normal light reflex with TMs without erythema, bulging. Good dentition. No erythema, swelling, or exudate on post pharynx. Tonsils not swollen or erythematous. Hearing normal.  Neck: Supple, thyroid normal. No bruits  Respiratory: Respiratory effort normal, BS equal bilaterally without rales, rhonchi, wheezing or stridor.  Cardio: RRR without murmurs, rubs or gallops. Intact brisk pedal pulses, post tibial pulses are faint,  without edema.  Chest: symmetric, with normal excursions and percussion.  Abdomen: Soft, nontender, no guarding, rebound, masses, or organomegaly. Mild ventral hernia with bearing down.  Lymphatics: Non tender without lymphadenopathy.  Genitourinary: No concerns, declines; urology following Musculoskeletal: Full ROM all peripheral extremities,5/5 strength, and normal gait.  Skin: Warm, dry . 2 seborrheic keratosis appearing lesion on left temple area Neuro: Cranial nerves intact, reflexes equal bilaterally. Normal muscle tone, no cerebellar symptoms. Sensation intact.  Psych: Awake and oriented X 3, normal affect, Insight and Judgment appropriate.   EKG: mild progression RBBB  Oney Tatlock W Ladine Kiper 10:16 AM [facility]

## 2021-06-29 ENCOUNTER — Ambulatory Visit (INDEPENDENT_AMBULATORY_CARE_PROVIDER_SITE_OTHER): Payer: Medicare Other | Admitting: Nurse Practitioner

## 2021-06-29 ENCOUNTER — Encounter: Payer: Self-pay | Admitting: Nurse Practitioner

## 2021-06-29 ENCOUNTER — Other Ambulatory Visit: Payer: Self-pay

## 2021-06-29 VITALS — BP 128/70 | HR 88 | Temp 97.9°F | Resp 16 | Ht 69.8 in | Wt 200.8 lb

## 2021-06-29 DIAGNOSIS — E1122 Type 2 diabetes mellitus with diabetic chronic kidney disease: Secondary | ICD-10-CM

## 2021-06-29 DIAGNOSIS — Z0000 Encounter for general adult medical examination without abnormal findings: Secondary | ICD-10-CM | POA: Diagnosis not present

## 2021-06-29 DIAGNOSIS — R972 Elevated prostate specific antigen [PSA]: Secondary | ICD-10-CM

## 2021-06-29 DIAGNOSIS — Z136 Encounter for screening for cardiovascular disorders: Secondary | ICD-10-CM

## 2021-06-29 DIAGNOSIS — L989 Disorder of the skin and subcutaneous tissue, unspecified: Secondary | ICD-10-CM

## 2021-06-29 DIAGNOSIS — I10 Essential (primary) hypertension: Secondary | ICD-10-CM

## 2021-06-29 DIAGNOSIS — I700 Atherosclerosis of aorta: Secondary | ICD-10-CM

## 2021-06-29 DIAGNOSIS — E559 Vitamin D deficiency, unspecified: Secondary | ICD-10-CM | POA: Diagnosis not present

## 2021-06-29 DIAGNOSIS — I2510 Atherosclerotic heart disease of native coronary artery without angina pectoris: Secondary | ICD-10-CM

## 2021-06-29 DIAGNOSIS — E538 Deficiency of other specified B group vitamins: Secondary | ICD-10-CM

## 2021-06-29 DIAGNOSIS — E1159 Type 2 diabetes mellitus with other circulatory complications: Secondary | ICD-10-CM

## 2021-06-29 DIAGNOSIS — F17200 Nicotine dependence, unspecified, uncomplicated: Secondary | ICD-10-CM

## 2021-06-29 DIAGNOSIS — Z23 Encounter for immunization: Secondary | ICD-10-CM | POA: Diagnosis not present

## 2021-06-29 DIAGNOSIS — G4733 Obstructive sleep apnea (adult) (pediatric): Secondary | ICD-10-CM

## 2021-06-29 DIAGNOSIS — E663 Overweight: Secondary | ICD-10-CM

## 2021-06-29 DIAGNOSIS — E1169 Type 2 diabetes mellitus with other specified complication: Secondary | ICD-10-CM

## 2021-06-29 DIAGNOSIS — N181 Chronic kidney disease, stage 1: Secondary | ICD-10-CM

## 2021-06-29 DIAGNOSIS — G2581 Restless legs syndrome: Secondary | ICD-10-CM

## 2021-06-29 DIAGNOSIS — Z79899 Other long term (current) drug therapy: Secondary | ICD-10-CM

## 2021-06-29 DIAGNOSIS — J449 Chronic obstructive pulmonary disease, unspecified: Secondary | ICD-10-CM

## 2021-06-29 NOTE — Patient Instructions (Signed)

## 2021-06-30 LAB — CBC WITH DIFFERENTIAL/PLATELET
Absolute Monocytes: 753 cells/uL (ref 200–950)
Basophils Absolute: 28 cells/uL (ref 0–200)
Basophils Relative: 0.3 %
Eosinophils Absolute: 353 cells/uL (ref 15–500)
Eosinophils Relative: 3.8 %
HCT: 41.2 % (ref 38.5–50.0)
Hemoglobin: 14 g/dL (ref 13.2–17.1)
Lymphs Abs: 1693 cells/uL (ref 850–3900)
MCH: 31.3 pg (ref 27.0–33.0)
MCHC: 34 g/dL (ref 32.0–36.0)
MCV: 92 fL (ref 80.0–100.0)
MPV: 11.9 fL (ref 7.5–12.5)
Monocytes Relative: 8.1 %
Neutro Abs: 6473 cells/uL (ref 1500–7800)
Neutrophils Relative %: 69.6 %
Platelets: 177 10*3/uL (ref 140–400)
RBC: 4.48 10*6/uL (ref 4.20–5.80)
RDW: 12.5 % (ref 11.0–15.0)
Total Lymphocyte: 18.2 %
WBC: 9.3 10*3/uL (ref 3.8–10.8)

## 2021-06-30 LAB — COMPLETE METABOLIC PANEL WITH GFR
AG Ratio: 1.8 (calc) (ref 1.0–2.5)
ALT: 25 U/L (ref 9–46)
AST: 15 U/L (ref 10–35)
Albumin: 4.1 g/dL (ref 3.6–5.1)
Alkaline phosphatase (APISO): 83 U/L (ref 35–144)
BUN: 13 mg/dL (ref 7–25)
CO2: 26 mmol/L (ref 20–32)
Calcium: 9.2 mg/dL (ref 8.6–10.3)
Chloride: 105 mmol/L (ref 98–110)
Creat: 0.7 mg/dL (ref 0.70–1.35)
Globulin: 2.3 g/dL (calc) (ref 1.9–3.7)
Glucose, Bld: 173 mg/dL — ABNORMAL HIGH (ref 65–99)
Potassium: 4.2 mmol/L (ref 3.5–5.3)
Sodium: 137 mmol/L (ref 135–146)
Total Bilirubin: 0.5 mg/dL (ref 0.2–1.2)
Total Protein: 6.4 g/dL (ref 6.1–8.1)
eGFR: 100 mL/min/{1.73_m2} (ref >=60)

## 2021-06-30 LAB — URINALYSIS, ROUTINE W REFLEX MICROSCOPIC
Bilirubin Urine: NEGATIVE
Hgb urine dipstick: NEGATIVE
Ketones, ur: NEGATIVE
Leukocytes,Ua: NEGATIVE
Nitrite: NEGATIVE
Protein, ur: NEGATIVE
Specific Gravity, Urine: 1.025 (ref 1.001–1.035)
pH: 6 (ref 5.0–8.0)

## 2021-06-30 LAB — LIPID PANEL
Cholesterol: 94 mg/dL (ref <200)
HDL: 45 mg/dL (ref >=40)
LDL Cholesterol (Calc): 37 mg/dL (calc)
Non-HDL Cholesterol (Calc): 49 mg/dL (calc) (ref <130)
Total CHOL/HDL Ratio: 2.1 (calc) (ref <5.0)
Triglycerides: 42 mg/dL (ref <150)

## 2021-06-30 LAB — TSH: TSH: 1.35 mIU/L (ref 0.40–4.50)

## 2021-06-30 LAB — MAGNESIUM: Magnesium: 2 mg/dL (ref 1.5–2.5)

## 2021-06-30 LAB — HEMOGLOBIN A1C
Hgb A1c MFr Bld: 6.8 % of total Hgb — ABNORMAL HIGH (ref <5.7)
Mean Plasma Glucose: 148 mg/dL
eAG (mmol/L): 8.2 mmol/L

## 2021-06-30 LAB — MICROALBUMIN / CREATININE URINE RATIO
Creatinine, Urine: 122 mg/dL (ref 20–320)
Microalb Creat Ratio: 15 mcg/mg creat (ref <30)
Microalb, Ur: 1.8 mg/dL

## 2021-06-30 LAB — VITAMIN D 25 HYDROXY (VIT D DEFICIENCY, FRACTURES): Vit D, 25-Hydroxy: 81 ng/mL (ref 30–100)

## 2021-06-30 LAB — PSA: PSA: 2.07 ng/mL (ref <=4.00)

## 2021-06-30 NOTE — Progress Notes (Signed)
   Interstitial Lung Disease Multidisciplinary Conference   Joe Green    MRN [id number]    DOB 10-11-1950  Primary Care Physician:McKeown, Gwyndolyn Saxon, MD  Referring Physician: Marshell Garfinkel MD  Time of Conference: 7.30am- 8.30am Date of conference: 05/24/2022 Location of Conference: -  Virtual  Participating Pulmonary: Dr. Brand Males, MD,  Dr Marshell Garfinkel, MD Pathology: Dr Jaquita Folds, MD Radiology: Dr Vinnie Langton MD Others:   Brief History:  70 year old referred for evaluation of abnormal CT with concern for ILD. No significant exposures or connective tissue disease symptoms.   Please review HRCT for pattern of ILD and evaluate progression.   Serology:  CTD serologies 03/22/21-significant only for rheumatoid factor of 18  MDD discussion of CT scan   High-resolution CT 05/04/2021-peripheral and basilar predominant groundglass with minimal subpleural reticulation, alternate diagnosis.  Differential diagnosis include NSIP, DIP.  UIP is also possible in some cases that alternate diagnosis  PFTs:  05/20/2021 FVC 4.24 [96%], FEV1 3.05 [94%], F/F 72, TLC 6.77 [97%], DLCO 19.86 [77%] Minimal diffusion defect    MDD Impression/Recs:  CT findings are felt to be alternate pattern due to predominant nature of groundglass opacities over fibrotic changes and no clear honeycombing.  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  Would be reasonable to offer bronchoscopy with ENVISIA biopsy.  He would also be able to tolerate a surgical lung biopsy if he is willing to go through with that as chances are of making a diagnosis are higher  Recommend discussion with patient for informed decision making about biopsy options versus monitoring with conservative management.   Time Spent in preparation and discussion:  > 30 min    SIGNATURE   Marshell Garfinkel MD [facility] 06/30/2021, 12:34 PM

## 2021-07-11 NOTE — Progress Notes (Signed)
Cardiology Office Note    Date:  07/19/2021   ID:  Joe Green, DOB Nov 01, 1950, MRN [id number]   PCP:  Joe Green, [facility]  Cardiologist:  Joe Rouge, MD   Advanced Practice Provider:  No care team member to display Electrophysiologist:  None   [phone]   Chief Complaint  Patient presents with   Follow-up     History of Present Illness:  Joe Green is a 70 y.o. male with history of hypertension, HLD, DM2, COPD, CAD with stent to the RCA 2005, chronic RBBB, cath 2006 patent RCA stent with residual 80% diagonal and moderate LAD diagonal 1 disease.  Cath 2012  patent RCA stent, proximal LAD 60% unchanged LVEF 65% medical therapy, normal NST 07/2019  Patient was last seen in our office 11/25/2020 and was stable.  Patient comes in for f/u. Has an occasional pressure that eases within minutes. Not associated with activity. Happens at random times. Comes in because CT showed aortic atherosclerosis and he wanted to know more about it. Rebuilding a deck on his house and pool and has a workshop. No regular exercise outside of all the work on his house. Has a bike he says he will start to ride. BP usually normal at home. Labs reviewed from 06/29/21 and stable.  Past Medical History:  Diagnosis Date   CAD (coronary artery disease)    a. s/p stent to RCA 2005; b. LHC 2006 after Nuc with inf ischemia: patent RCA stent, D1 80%, LAD 60% => med Rx;  c. LHC 04/18/11: Proximal LAD 60% (unchanged from previous catheterization), distal LAD 65% (small caliber), Dx 60%, proximal CFX 20%, mid CFX 50%, OM1 30%, proximal RCA 20%, mid RCA stent patent, EF 65%.=> med Rx   GERD (gastroesophageal reflux disease)    HLD (hyperlipidemia)    Hypertension    Obesity    OSA (obstructive sleep apnea)    has a cpap-does not use-has a special mouthpiece he wears at night   Type II or unspecified type diabetes mellitus without mention of complication, not stated  as uncontrolled    Vitamin D deficiency    Wears glasses     Past Surgical History:  Procedure Laterality Date   CARDIAC CATHETERIZATION  2005   stent   CHONDROPLASTY  06/25/2017   Procedure: CHONDROPLASTY;  Surgeon: Dorna Leitz, MD;  Location: [place];  Service: Orthopedics;;   COLONOSCOPY  11/2011   Due 5 years Dr. Fuller Plan   heart stent  2005   [place] Right 11/16/2014   Procedure: RIGHT INGUINAL HERNIA REPAIR WITH MESH;  Surgeon: Armandina Gemma, MD;  Location: [place];  Service: General;  Laterality: Right;   INSERTION OF MESH Right 11/16/2014   Procedure: INSERTION OF MESH;  Surgeon: Armandina Gemma, MD;  Location: [place];  Service: General;  Laterality: Right;   KNEE ARTHROSCOPY WITH MEDIAL MENISECTOMY Bilateral 06/25/2017   Procedure: right knee arthroscopy with medial meniscectomy, chondroplasty, Left knee arthroscopy with medial menisectomy removal of plica and chondroplasty;  Surgeon: Dorna Leitz, MD;  Location: [place];  Service: Orthopedics;  Laterality: Bilateral;   POLYPECTOMY      Current Medications: Current Meds  Medication Sig   acetaminophen (TYLENOL) 500 MG tablet Take 2 tablets (1,000 mg total) by mouth every 6 (six) hours.   aspirin EC 81 MG tablet Take 1 tablet (81 mg total) by mouth daily.  benzonatate (TESSALON) 200 MG capsule TAKE 1 CAPSULE BY MOUTH THREE TIMES DAILY AS NEEDED TO  PREVENT  COUGH   blood glucose meter kit and supplies Dispense based on patient and insurance preference. Use up to four times daily as directed. (FOR ICD-9 250.00, 250.01).   Cholecalciferol (VITAMIN D) 50 MCG (2000 UT) tablet Take 1 tablet (2,000 Units total) by mouth daily.   doxazosin (CARDURA) 8 MG tablet Take  1 tablet  at Bedtime  for BP & Prostate   Dulaglutide ([name]) 1.5 YK/9.9IP SOPN Inject 1.5 mg into the skin once a week.   fexofenadine (ALLEGRA) 60 MG tablet Take 60 mg by mouth  daily as needed for allergies or rhinitis.   finasteride (PROSCAR) 5 MG tablet Take 1 tablet daily for prostate.   fluticasone furoate-vilanterol (BREO ELLIPTA) 100-25 MCG/ACT AEPB INHALE 1 PUFF BY MOUTH ONCE DAILY RINSE  MOUTH  WITH  WATER  AFTER  EACH  USE   glimepiride (AMARYL) 4 MG tablet TAKE 1/2-1 TABLET BY MOUTH TWICE DAILY WITH MEALS FOR DIABETES   glucose blood test strip Test blood sugars once daily.Dx: E11.9   metFORMIN (GLUCOPHAGE) 1000 MG tablet Take  1 tablet  2 x /day with Meals  for Diabetes  / Patient knows to take by mouth   nitroGLYCERIN (NITROSTAT) 0.4 MG SL tablet Place 1 tablet (0.4 mg total) under the tongue every 5 (five) minutes as needed for chest pain.   ramipril (ALTACE) 5 MG capsule Take 1 capsule   Daily  For BP  & Diabetic Kidney Protection (Dx: e11.29)   rOPINIRole (REQUIP) 3 MG tablet Take one tablet three times a day for restless legs.   rosuvastatin (CRESTOR) 20 MG tablet Take 1 tab three nights a week for cholesterol.   Current Facility-Administered Medications for the 07/19/21 encounter (Office Visit) with Imogene Burn, PA-C  Medication   0.9 %  sodium chloride infusion     Allergies:   Patient has no known allergies.   Social History   Socioeconomic History   Marital status: Married    Spouse name: Not on file   Number of children: 3   Years of education: Not on file   Highest education level: Not on file  Occupational History   Not on file  Tobacco Use   Smoking status: Former    Packs/day: 0.50    Years: 51.00    Pack years: 25.50    Types: Cigarettes    Start date: 1970   Smokeless tobacco: Never  Vaping Use   Vaping Use: Never used  Substance and Sexual Activity   Alcohol use: Yes    Alcohol/week: 14.0 standard drinks    Types: 14 Cans of beer per week   Drug use: No   Sexual activity: Yes    Partners: Female    Birth control/protection: Post-menopausal  Other Topics Concern   Not on file  Social History Narrative   Not on  file   Social Determinants of Health   Financial Resource Strain: Not on file  Food Insecurity: Not on file  Transportation Needs: Not on file  Physical Activity: Not on file  Stress: Not on file  Social Connections: Not on file     Family History:  The patient's  family history includes Cancer in his mother; Hypertension in his father; Kidney cancer in his maternal grandmother; Prostate cancer in his father.   ROS:   Please see the history of present illness.    ROS All  other systems reviewed and are negative.   PHYSICAL EXAM:   VS:  BP (!) 142/74   Pulse 72   Ht _0  (1.778 m)   Wt 203 lb (92.1 kg)   BMI 29.13 kg/m   Physical Exam  GEN: Well nourished, well developed, in no acute distress  Neck: no JVD, carotid bruits, or masses Cardiac:RRR; no murmurs, rubs, or gallops  Respiratory: decreased breath sounds but clear to auscultation bilaterally, normal work of breathing GI: soft, nontender, nondistended, + BS Ext: without cyanosis, clubbing, or edema, Good distal pulses bilaterally Neuro:  Alert and Oriented x 3, Psych: euthymic mood, full affect  Wt Readings from Last 3 Encounters:  07/19/21 203 lb (92.1 kg)  06/29/21 200 lb 12.8 oz (91.1 kg)  06/21/21 205 lb 6.4 oz (93.2 kg)      Studies/Labs Reviewed:   EKG:  EKG is not ordered today.     Recent Labs: 06/29/2021: ALT 25; BUN 13; Creat 0.70; Hemoglobin 14.0; Magnesium 2.0; Platelets 177; Potassium 4.2; Sodium 137; TSH 1.35   Lipid Panel    Component Value Date/Time   CHOL 94 06/29/2021 1048   TRIG 42 06/29/2021 1048   HDL 45 06/29/2021 1048   CHOLHDL 2.1 06/29/2021 1048   VLDL 17 03/02/2017 1115   LDLCALC 37 06/29/2021 1048    Additional studies/ records that were reviewed today include:   CT chest 04/2021 IMPRESSION: 1. Pulmonary parenchymal pattern of peripheral and basilar predominant ground-glass with very minimal subpleural reticulation and traction bronchiolectasis, similar to 02/17/2020.  Findings may be due to nonspecific interstitial pneumonitis. Findings are suggestive of an alternative diagnosis (not UIP) per consensus guidelines: Diagnosis of Idiopathic Pulmonary Fibrosis: An Official ATS/ERS/JRS/ALAT Clinical Practice Guideline. [place], Iss 5, [phone], Apr 14 2017. 2. Cholelithiasis. 3. Aortic atherosclerosis (ICD10-I70.0). Coronary artery calcification. 4.  Emphysema (ICD10-J43.9).  CT abdomen 05/2021 IMPRESSION: 1. No acute process identified.  No hernia visualized. 2. Cholelithiasis. 3. Right renal calculus. 4. Mild colonic diverticulosis. 5. Marked prostatomegaly    Vascular/Lymphatic: Aortic atherosclerosis. No enlarged abdominal or pelvic lymph nodes     NST 07/24/2019 Nuclear stress EF: 65%. The left ventricular ejection fraction is normal (55-65%). There was no ST segment deviation noted during stress. This is a low risk study. There is no evidence of previous myocardial infarction or evidence of ischemia. The study is normal   Risk Assessment/Calculations:         ASSESSMENT:    1. Coronary artery disease involving native coronary artery of native heart without angina pectoris   2. Essential hypertension   3. Hyperlipidemia, unspecified hyperlipidemia type   4. Type 2 diabetes mellitus with other circulatory complication, without long-term current use of insulin ([place])   5. Aortic atherosclerosis (HCC)by Chest CT scan 11/2016      PLAN:  In order of problems listed above:  CAD status post stent to the RCA in 2005 with residual disease on follow-up in 2006 in 2012 but patent RCA stent.  NST 2020 no ischemia-has occasional chest pressure at random times that eases spontaneously in minutes. Working hard rebuilding a deck, heavy lifting without symptoms. Will continue to monitor. NTG prn. 150 min exercise weekly. F/u in 1 yr  Hypertension-BP controlled at home  HLD LDL 37 06/29/21 on Crestor  DM2-A1C down  6.8  Aortic atherosclerosis on chest and abdominal CT recently reviewed. No aneurysm. On a statin.   Shared Decision Making/Informed Consent  Medication Adjustments/Labs and Tests Ordered: Current medicines are reviewed at length with the patient today.  Concerns regarding medicines are outlined above.  Medication changes, Labs and Tests ordered today are listed in the Patient Instructions below. Patient Instructions  Medication Instructions:    IF YOU HAVE CHEST PAIN ONLY: TAKE NITROGLYCERIN 0.4 MG SUBLINGUAL AS NEEDED   *If you need a refill on your cardiac medications before your next appointment, please call your pharmacy*   Lab Work: NONE ORDERED  TODAY   If you have labs (blood work) drawn today and your tests are completely normal, you will receive your results only by: [place] (if you have MyChart) OR A paper copy in the mail If you have any lab test that is abnormal or we need to change your treatment, we will call you to review the results.   Testing/Procedures: NONE ORDERED  TODAY    Follow-Up: At [facility], you and your health needs are our priority.  As part of our continuing mission to provide you with exceptional heart care, we have created designated Provider Care Teams.  These Care Teams include your primary Cardiologist (physician) and Advanced Practice Providers (APPs -  Physician Assistants and Nurse Practitioners) who all work together to provide you with the care you need, when you need it.  We recommend signing up for the patient portal called "MyChart".  Sign up information is provided on this After Visit Summary.  MyChart is used to connect with patients for Virtual Visits (Telemedicine).  Patients are able to view lab/test results, encounter notes, upcoming appointments, etc.  Non-urgent messages can be sent to your provider as well.   To learn more about what you can do with MyChart, go to [email or web address].    Your next  appointment:   1 year(s)  The format for your next appointment:   In Person  Provider:   Jenkins Rouge, MD {   Other Instructions   Exercise Information for Aging Adults Staying physically active is important as you age. Physical activity and exercise can help in maintaining quality of life, health, physical function, and reducing falls. The four types of exercises that are best for older adults are endurance, strength, balance, and flexibility. Contact your health care provider before you start any exercise routine. Ask your health care provider what activities are safe for you. What are the risks? Risks associated with exercising include: Overdoing it. This may lead to sore muscles or fatigue. Falls. Injuries. Dehydration. How to do these exercises Endurance exercises Endurance (aerobic) exercises raise your breathing rate and heart rate. Increasing your endurance helps you do everyday tasks and stay healthy. By improving the health of your body system that includes your heart, lungs, and blood vessels (circulatory system), you may also delay or prevent diseases such as heart disease, diabetes, and weak bones (osteoporosis). Types of endurance exercises include: Sports. Indoor activities, such as using gym equipment, doing water aerobics, or dancing. Outdoor activities, such as biking or jogging. Tasks around the house, such as gardening, yard work, and heavy household chores like cleaning. Walking, such as hiking or walking around your neighborhood. When doing endurance exercises, make sure you: Are aware of your surroundings. Use safety equipment as directed. Dress in layers when exercising outdoors. Drink plenty of water to stay well hydrated. Build up endurance slowly. Start with 10 minutes at a time, and gradually build up to doing 30 minutes at a time. Unless your health care provider gave you different instructions, aim to  exercise for a total of 150 minutes a week. Spread  out that time so you are working on endurance 3 or more days a week. Strength exercises Lifting, pulling, or pushing weights helps to strengthen muscles. Having stronger muscles makes it easier to do everyday activities, such as getting up from a chair, climbing stairs, carrying groceries, and playing with grandchildren. Strength exercises include arm and leg exercises that may be done: With weights. Without weights (using your own body weight). With a resistance band. When doing strength exercises: Move smoothly and steadily. Do not suddenly thrust or jerk the weights, the resistance band, or your body. Start with no weights or with light weights, and gradually add more weight over time. Eventually, aim to use weights that are hard or very hard for you to lift. This means that you are able to do 8 repetitions with the weight, and the last few repetitions are very challenging. Lift or push weights into position for 3 seconds, hold the position for 1 second, and then take 3 seconds to return to your starting position. Breathe out (exhale) during difficult movements, like lifting or pushing weights. Breathe in (inhale) to relax your muscles before the next repetition. Consider alternating arms or legs, especially when you first start strength exercises. Expect some slight muscle soreness after each session. Do strength exercises on 2 or more days a week, for 30 minutes at a time. Avoid exercising the same muscle groups two days in a row. For example, if you work on your leg muscles one day, work on your arm muscles the next day. When you can do two sets of 10-15 repetitions with a certain weight, increase the amount of weight. Balance exercises Balance exercises can help to prevent falls. Balance exercises include: Standing on one foot. Heel-to-toe walk. Balance walk. Tai chi. Make sure you have something sturdy to hold onto while doing balance exercises, such as a sturdy chair. As your balance  improves, challenge yourself by holding on to the chair with one hand instead of two, and then with no hands. Trying exercises with your eyes closed also challenges your balance, but be sure to have a sturdy surface (like a countertop) close by in case you need it. Do balance exercises as often as you want, or as often as directed by your health care provider. Flexibility exercises Flexibility exercises improve how far you can bend, straighten, move, or rotate parts of your body (range of motion). These exercises also help you do everyday activities such as getting dressed or reaching for objects. Flexibility exercises include stretching different parts of the body, and they may be done in a standing or seated position or on the floor. When stretching, make sure you: Keep a slight bend in your arms and legs. Avoid completely straightening ("locking") your joints. Do not stretch so far that you feel pain. You should feel a mild stretching feeling. You may try stretching farther as you become more flexible over time. Relax and breathe between stretches. Hold on to something sturdy for balance as needed. Hold each stretch for 10-30 seconds. Repeat each stretch 3-5 times. General safety tips Exercise in well-lit areas. Do not hold your breath during exercises or stretches. Warm up before exercising, and cool down after exercising. This can help prevent injury. Drink plenty of water during exercise or any activity that makes you sweat. If you are not sure if an exercise is safe for you, or you are not sure how to do an exercise,  talk with your health care provider. This is especially important if you have had surgery on muscles, bones, or joints (orthopedic surgery). Where to find more information You can find more information about exercise for older adults from: Your local health department, fitness center, or community center. These facilities may have programs for aging adults. [employer]  on Aging: http://kim-miller.com/ National Council on Aging: www.ncoa.org Summary Staying physically active is important as you age. Doing endurance, strength, balance, and flexibility exercises can help in maintaining quality of life, health, physical function, and reducing falls. Make sure to contact your health care provider before you start any exercise routine. Ask your health care provider what activities are safe for you. This information is not intended to replace advice given to you by your health care provider. Make sure you discuss any questions you have with your health care provider. Document Revised: 12/13/2020 Document Reviewed: 12/13/2020 Elsevier Patient Education  2022 [place], Ermalinda Barrios, [place]  07/19/2021 8:48 AM    [address], [place], [place]  [address] Phone: [phone]; Fax: [phone]

## 2021-07-19 ENCOUNTER — Encounter: Payer: Self-pay | Admitting: Physician Assistant

## 2021-07-19 ENCOUNTER — Other Ambulatory Visit: Payer: Self-pay

## 2021-07-19 ENCOUNTER — Ambulatory Visit: Payer: Medicare Other | Admitting: Physician Assistant

## 2021-07-19 VITALS — BP 142/74 | HR 72 | Ht 70.0 in | Wt 203.0 lb

## 2021-07-19 DIAGNOSIS — I2510 Atherosclerotic heart disease of native coronary artery without angina pectoris: Secondary | ICD-10-CM

## 2021-07-19 DIAGNOSIS — I10 Essential (primary) hypertension: Secondary | ICD-10-CM | POA: Diagnosis not present

## 2021-07-19 DIAGNOSIS — I700 Atherosclerosis of aorta: Secondary | ICD-10-CM

## 2021-07-19 DIAGNOSIS — E785 Hyperlipidemia, unspecified: Secondary | ICD-10-CM | POA: Diagnosis not present

## 2021-07-19 DIAGNOSIS — E1159 Type 2 diabetes mellitus with other circulatory complications: Secondary | ICD-10-CM | POA: Diagnosis not present

## 2021-07-19 MED ORDER — NITROGLYCERIN 0.4 MG SL SUBL: 0.4000 mg | SUBLINGUAL_TABLET | SUBLINGUAL | 3 refills | Status: DC | PRN

## 2021-07-19 NOTE — Patient Instructions (Addendum)
Medication Instructions:    IF YOU HAVE CHEST PAIN ONLY: TAKE NITROGLYCERIN 0.4 MG SUBLINGUAL AS NEEDED   *If you need a refill on your cardiac medications before your next appointment, please call your pharmacy*   Lab Work: NONE ORDERED  TODAY   If you have labs (blood work) drawn today and your tests are completely normal, you will receive your results only by: [facility] (if you have MyChart) OR A paper copy in the mail If you have any lab test that is abnormal or we need to change your treatment, we will call you to review the results.   Testing/Procedures: NONE ORDERED  TODAY    Follow-Up: At [facility], you and your health needs are our priority.  As part of our continuing mission to provide you with exceptional heart care, we have created designated Provider Care Teams.  These Care Teams include your primary Cardiologist (physician) and Advanced Practice Providers (APPs -  Physician Assistants and Nurse Practitioners) who all work together to provide you with the care you need, when you need it.  We recommend signing up for the patient portal called "MyChart".  Sign up information is provided on this After Visit Summary.  MyChart is used to connect with patients for Virtual Visits (Telemedicine).  Patients are able to view lab/test results, encounter notes, upcoming appointments, etc.  Non-urgent messages can be sent to your provider as well.   To learn more about what you can do with MyChart, go to [email or web address].    Your next appointment:   1 year(s)  The format for your next appointment:   In Person  Provider:   Jenkins Rouge, MD {   Other Instructions   Exercise Information for Aging Adults Staying physically active is important as you age. Physical activity and exercise can help in maintaining quality of life, health, physical function, and reducing falls. The four types of exercises that are best for older adults are endurance, strength, balance,  and flexibility. Contact your health care provider before you start any exercise routine. Ask your health care provider what activities are safe for you. What are the risks? Risks associated with exercising include: Overdoing it. This may lead to sore muscles or fatigue. Falls. Injuries. Dehydration. How to do these exercises Endurance exercises Endurance (aerobic) exercises raise your breathing rate and heart rate. Increasing your endurance helps you do everyday tasks and stay healthy. By improving the health of your body system that includes your heart, lungs, and blood vessels (circulatory system), you may also delay or prevent diseases such as heart disease, diabetes, and weak bones (osteoporosis). Types of endurance exercises include: Sports. Indoor activities, such as using gym equipment, doing water aerobics, or dancing. Outdoor activities, such as biking or jogging. Tasks around the house, such as gardening, yard work, and heavy household chores like cleaning. Walking, such as hiking or walking around your neighborhood. When doing endurance exercises, make sure you: Are aware of your surroundings. Use safety equipment as directed. Dress in layers when exercising outdoors. Drink plenty of water to stay well hydrated. Build up endurance slowly. Start with 10 minutes at a time, and gradually build up to doing 30 minutes at a time. Unless your health care provider gave you different instructions, aim to exercise for a total of 150 minutes a week. Spread out that time so you are working on endurance 3 or more days a week. Strength exercises Lifting, pulling, or pushing weights helps to strengthen muscles. Having stronger muscles makes  it easier to do everyday activities, such as getting up from a chair, climbing stairs, carrying groceries, and playing with grandchildren. Strength exercises include arm and leg exercises that may be done: With weights. Without weights (using your own body  weight). With a resistance band. When doing strength exercises: Move smoothly and steadily. Do not suddenly thrust or jerk the weights, the resistance band, or your body. Start with no weights or with light weights, and gradually add more weight over time. Eventually, aim to use weights that are hard or very hard for you to lift. This means that you are able to do 8 repetitions with the weight, and the last few repetitions are very challenging. Lift or push weights into position for 3 seconds, hold the position for 1 second, and then take 3 seconds to return to your starting position. Breathe out (exhale) during difficult movements, like lifting or pushing weights. Breathe in (inhale) to relax your muscles before the next repetition. Consider alternating arms or legs, especially when you first start strength exercises. Expect some slight muscle soreness after each session. Do strength exercises on 2 or more days a week, for 30 minutes at a time. Avoid exercising the same muscle groups two days in a row. For example, if you work on your leg muscles one day, work on your arm muscles the next day. When you can do two sets of 10-15 repetitions with a certain weight, increase the amount of weight. Balance exercises Balance exercises can help to prevent falls. Balance exercises include: Standing on one foot. Heel-to-toe walk. Balance walk. Tai chi. Make sure you have something sturdy to hold onto while doing balance exercises, such as a sturdy chair. As your balance improves, challenge yourself by holding on to the chair with one hand instead of two, and then with no hands. Trying exercises with your eyes closed also challenges your balance, but be sure to have a sturdy surface (like a countertop) close by in case you need it. Do balance exercises as often as you want, or as often as directed by your health care provider. Flexibility exercises Flexibility exercises improve how far you can bend,  straighten, move, or rotate parts of your body (range of motion). These exercises also help you do everyday activities such as getting dressed or reaching for objects. Flexibility exercises include stretching different parts of the body, and they may be done in a standing or seated position or on the floor. When stretching, make sure you: Keep a slight bend in your arms and legs. Avoid completely straightening ("locking") your joints. Do not stretch so far that you feel pain. You should feel a mild stretching feeling. You may try stretching farther as you become more flexible over time. Relax and breathe between stretches. Hold on to something sturdy for balance as needed. Hold each stretch for 10-30 seconds. Repeat each stretch 3-5 times. General safety tips Exercise in well-lit areas. Do not hold your breath during exercises or stretches. Warm up before exercising, and cool down after exercising. This can help prevent injury. Drink plenty of water during exercise or any activity that makes you sweat. If you are not sure if an exercise is safe for you, or you are not sure how to do an exercise, talk with your health care provider. This is especially important if you have had surgery on muscles, bones, or joints (orthopedic surgery). Where to find more information You can find more information about exercise for older adults from: Your local  health department, fitness center, or community center. These facilities may have programs for aging adults. [employer] on Aging: http://kim-miller.com/ National Council on Aging: www.ncoa.org Summary Staying physically active is important as you age. Doing endurance, strength, balance, and flexibility exercises can help in maintaining quality of life, health, physical function, and reducing falls. Make sure to contact your health care provider before you start any exercise routine. Ask your health care provider what activities are safe for you. This  information is not intended to replace advice given to you by your health care provider. Make sure you discuss any questions you have with your health care provider. Document Revised: 12/13/2020 Document Reviewed: 12/13/2020 Else[employer] Patient Education  [place].

## 2021-07-27 ENCOUNTER — Other Ambulatory Visit: Payer: Self-pay

## 2021-07-27 ENCOUNTER — Ambulatory Visit: Payer: Medicare Other | Admitting: Pharmacist

## 2021-07-27 VITALS — BP 130/75

## 2021-07-27 DIAGNOSIS — Z79899 Other long term (current) drug therapy: Secondary | ICD-10-CM

## 2021-07-27 DIAGNOSIS — E1159 Type 2 diabetes mellitus with other circulatory complications: Secondary | ICD-10-CM

## 2021-07-27 DIAGNOSIS — I10 Essential (primary) hypertension: Secondary | ICD-10-CM

## 2021-07-27 DIAGNOSIS — R972 Elevated prostate specific antigen [PSA]: Secondary | ICD-10-CM

## 2021-07-27 DIAGNOSIS — I2510 Atherosclerotic heart disease of native coronary artery without angina pectoris: Secondary | ICD-10-CM

## 2021-07-27 DIAGNOSIS — E785 Hyperlipidemia, unspecified: Secondary | ICD-10-CM

## 2021-07-27 DIAGNOSIS — R350 Frequency of micturition: Secondary | ICD-10-CM

## 2021-07-27 DIAGNOSIS — E663 Overweight: Secondary | ICD-10-CM

## 2021-07-27 DIAGNOSIS — N401 Enlarged prostate with lower urinary tract symptoms: Secondary | ICD-10-CM

## 2021-07-27 DIAGNOSIS — G4733 Obstructive sleep apnea (adult) (pediatric): Secondary | ICD-10-CM

## 2021-07-27 DIAGNOSIS — J449 Chronic obstructive pulmonary disease, unspecified: Secondary | ICD-10-CM

## 2021-07-27 DIAGNOSIS — E1169 Type 2 diabetes mellitus with other specified complication: Secondary | ICD-10-CM

## 2021-07-28 DIAGNOSIS — U071 COVID-19: Secondary | ICD-10-CM | POA: Diagnosis not present

## 2021-07-29 ENCOUNTER — Other Ambulatory Visit: Payer: Self-pay

## 2021-07-29 ENCOUNTER — Ambulatory Visit: Payer: Medicare Other | Admitting: Adult Health

## 2021-08-03 DIAGNOSIS — Z1152 Encounter for screening for COVID-19: Secondary | ICD-10-CM | POA: Diagnosis not present

## 2021-08-11 DIAGNOSIS — I2510 Atherosclerotic heart disease of native coronary artery without angina pectoris: Secondary | ICD-10-CM | POA: Diagnosis not present

## 2021-08-11 DIAGNOSIS — E785 Hyperlipidemia, unspecified: Secondary | ICD-10-CM | POA: Diagnosis not present

## 2021-08-11 DIAGNOSIS — I10 Essential (primary) hypertension: Secondary | ICD-10-CM | POA: Diagnosis not present

## 2021-08-11 DIAGNOSIS — E1169 Type 2 diabetes mellitus with other specified complication: Secondary | ICD-10-CM | POA: Diagnosis not present

## 2021-08-12 NOTE — Progress Notes (Signed)
Patient Visit with Chart Prep     Joe, Green [id number]  66 years, Male  DOB: 1951-06-17  M: [phone]  Care Team Jasper Riling     __________________________________________________  Summary: Joe Green is a 70 year old patient who presents for initial CCM visit. He has no chief complaints today   Recommendations/Changes made from today's visit: Consider pt for RPM BP monitor when available   Patient scheduled?for?CCM visit with the clinical pharmacist.? Patient is referred for CCM by their PCP and CPP is under general PCP supervision.: At least 2 of these conditions are expected to last 12 months or longer and patient is at significant risk for acute exacerbations and/or functional decline.  Patient has consented to participation in [place] program.  Visit Type: Phone Call  Date of Upcoming Visit: 07/27/2021    Pre-Call Questions [facility])  Are you able to connect with Patient: Yes  Visit Type: Phone Call  May we confirm what is the best phone # for the pharmacist to call you?: [phone]  Confirm visit date/time: 07/27/21 at 9am  Have you seen any other providers since your last visit?: No  Any changes in your medicines or health?: No  Any side effects from any medications?: No  Do you have any symptoms or problems not managed by your medications?: No  What are your top 2 health concerns right now?: None at the moment  What are your top 2 medication concerns right now?: Other  Details: Would like to come off of Metformin, because he says it's a "bad drug"  Has your Dr asked that you take BP, BG or special diet at home?: Monitor BG  Do you get any type of exercise on a regular basis?: No  What are your top 1 or 2 goals for your health?: None  What are you doing to try to reach these goals?: Monitoring BG, Taking medications  What, if any, problems do you have getting your medications from the pharmacy?: None  Is there anything else that you would like  to discuss during the appointment?: No   Patient's Chronic Conditions: Cardiovascular Disease (CVD), Chronic Obstructive Pulmonary Disease (COPD), Hypertension (HTN), Diabetes (DM), Hyperlipidemia/Dyslipidemia (HLD), Benign Prostatic Hyperplasia (BPH), OSA on CPAP, Vitamin D deficiency, Vitamin B12 deficiency, restless leg syndrome     Doctor and Hospital Visits  Were there PCP Visits in last 6 months?: Yes  Visit #1: 03/29/2021- [name], NP-Patient presented for AWV. BP 114/64, HR 73. Start Meloxicam 15mg , daily for 2 weeks. Increase [name] from 0.75mg  to 1.5mg  weekly. Stop Cetirizine 10mg , Prednisone 20mg  and Valacyclovir 1,000mg .    Visit #2: 06/29/2021- Marta Lamas, NP-Patient presented for annual physical exam. BP 128/70, HR 88. No medication changes noted.   Were there Specialist Visits in last 6 months?: Yes  Visit #1: 04/11/2021- Dr. Mannam(Pulmonary)-Patient presented for consult Interstitial Lung Disease. BP 118/64, HR 71. No medication changes noted.   Visit #2: 05/24/2021-Dr. Mannam(Pulmonary)-Patient presented for follow up ILD. BP 124/64, HR 70. Stop Meloxicam 15mg .   Visit #3: 06/21/2021- Dr. Hendrickson(Cardiothoracic surgery)-Patient presented for ILD consult. BP 152/68, HR 76. No medication changes noted.   Visit #4: 07/19/2021- Ermalinda Barrios, PA(Cardiology)- Patient presented for follow up CAD. BP 142/74, HR 72. Start Nitroglycerin 0.4mg , sublingual as needed.   Was there a Hospital Visit in last 30 days?: No  Were there other Hospital Visits in last 6 months?: No    Medication Information  Are there any Medication discrepancies?: No  Are there any Medication adherence gaps (beyond 5 days past due)?: No  Medication adherence rates for the STAR rating drugs: Rosuvastatin 20mg   -07/19/21, 90 DS  List Patient's current Care Gaps: No current Care Gaps identified    Subjective Information  Current BP: 142/74  Current HR: 72  taken on: 07/19/2021  Previous BP: 128/70   Previous HR: 88  taken on: 06/29/2021  Weight: 203  BMI: 29.13  Last GFR: 100  taken on: 06/29/2021  Why did the patient present?: CCM initial visit  Marital status?: Married  Details: Did not specify  Retired??Previous work?: 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 But mostly unrestricted diet. Mostly eats 2 meals a day. Coffee: 1 cup a day. Water: 1 bottle a day    Exercise: None  Alcohol,?tobacco,?and illicit drug usage?: Alcohol: couple beers 12 per week  Tobacco: None  Illicit drug usage: None    What is the patient's sleep pattern?: No sleep issues  How many hours per night does patient typically sleep?: 6-7 hours  Patient pleased with health care?they are?receiving?: Yes  Family, occupational,?and living circumstances relevant to?overall health?: Family History: The patient's family history includes Cancer in his mother; Hypertension in his father; Kidney cancer in his maternal grandmother; Prostate cancer in his father.   Factors?that may?affect?medication?adherence?: Perceived lack of benefit of therapy, Lack of understanding / insight of condition  Name and location of Current pharmacy: [facility], [place] (Ph: [phone])  Current Rx insurance plan: Other  Details: BCBS  Are meds synced by current pharmacy?: No  Are meds delivered by current pharmacy?: No - delivery not available  Would patient benefit from direct intervention of clinical lead in dispensing process to optimize clinical outcomes?: Yes  Are [facility] pharmacy services available where patient lives?: Yes  Is patient disadvantaged to use [facility]?: No  [facility] services reviewed with  patient and patient wishes to change pharmacy?: No  Select reason patient declined to change pharmacies: Patient preference  Does patient experience delays in picking up medications due to transportation concerns (getting to pharmacy)?: No  Any additional demeanor/mood notes?: Rollen Selders is a 70 year old patient who presents for initial CCM visit. He has no chief complaints today   Hypertension (HTN)  Assess this condition today?: Yes  Is patient able to obtain BP reading today?: Yes  BP today is: 130/75  Goal: <130/80 mmHG  Hypertension Stage: Stage 1 (SBP: 130-139?or?DBP: 80-89)  Is Patient checking BP at home?: Yes  Pt. home BP readings are ranging: <130/80  How often does patient miss taking their blood pressure medications?: Never  Has patient experienced hypotension, dizziness, falls or bradycardia?: No  Check present secondary causes (below) for HTN: Sleep Apnea  BP RPM device: Does patient qualify?: Yes  We discussed: Recommend using a salt substitute to replace your salt if you need flavor., Reducing the amount of salt intake to 1500mg /per day., Limiting the amount of alcohol to 1 or 2 drinks per day., Targeting 150 minutes of aerobic activity per week, Proper Home BP Measurement  Assessment:: Controlled  Drug: Ramipril 4mg  QD  Assessment: Appropriate, Effective, Safe, Accessible  Drug: Doxazosin 8mg   QD  Assessment: Appropriate, Effective, Safe, Accessible  HC Follow up: N/A  Pharmacist Follow up: Follow up on BP readings, assess for orthostatic hypotension, CMP    Hyperlipidemia/Dyslipidemia (HLD)  Last Lipid panel on: 06/29/2021  TC (Goal<200): 94  LDL: 37  HDL (Goal>40): 45  TG (Goal<150): 42  ASCVD 10-year risk?is:: N/A due to lab values outside the range  Assess this condition today?: Yes  LDL Goal: <70  Has patient tried and failed any HLD Medications?: No  Check present secondary causes (below) that can lead to increased cholesterol levels (multi-choice  optional): Alcohol use, Diabetes  We discussed: How to reduce cholesterol through diet/weight management and physical activity., How a diet high in plant sterols (fruits/vegetables/nuts/whole grains/legumes) may reduce your cholesterol., Encouraged increasing fiber to a daily intake of 10-25g/day  Assessment:: Controlled  Drug: Rosuvastatin 20mg  take 1 tablet 3 times a week  Assessment: Appropriate, Effective, Safe, Accessible  Additional Info: TSH WNL  HC Follow up: N/A  Pharmacist Follow up: Lipid panel, assess for s/s rhabdo, LFTs, TSH    Diabetes (DM)  Current A1C: 6.8  taken on: 06/29/2021  Previous A1C: 7.1  taken on: 03/28/2021  Type: 2  The current microalbumin ratio is: 15  tested on: 06/29/2021  Assess this condition today?: Yes  Goal A1C: < 7.0 %  Type: 2  Is Patient taking statin medication?: Yes  Is patient taking ACEi / ARB?: Yes  DM RPM device: Does patient qualify?: Yes  Patient checking Blood Sugar at home?: Yes  Does patient use a Continuous Glucose Monitoring (CGM) device?: No  How often does patient check Blood Sugars?: 1 time a day  Recent fasting Blood sugar reading: 144, usually less than 150  Has patient experienced any hypoglycemic episodes?: No  Diet recall discussed: No  We discussed: How to recognize and treat signs of hypoglycemia., Modifying lifestyle, including to participate in moderate physical activity (e.g., walking) at least 150 minutes per week., Low carbohydrate eating plan with an emphasis on whole grains, legumes, nuts, fruits, and vegetables and minimal refined and processed foods.  Assessment:: Controlled  Drug: [name] 0.75mg  weekly  Assessment: Appropriate, Effective, Safe, Query Accessibility  Drug: Glimeperid 4mg  take 1/2-1 tablet BID  Assessment: Appropriate, Effective, Safe, Accessible  Drug: Metformin 500mg  2 tablets BID - Pt not taking  Assessment: Appropriate, Query Effectiveness  Additional Info: Pt has a negative perception  about metformin, so he has not been taking it for 3 weeks. Encouraged patient that metformin is safe and effective and that he should continue taking.  Pt will need to fill another PAP form for [name] 1.5mg .   HC Follow up: Pt will need to fill another PAP form for [name] 1.5mg , please assist patient in filling out PAP  Pharmacist Follow up: Follow up with patient on metformin use, A1c, FBG, consider pt for blood sugar RPM    Chronic Obstructive Pulmonary Disease (COPD)  Current FEV1/FVC: 4.24(96%)  Current FEV1: 3.05(94%)  taken on: 05/19/2021  Current Eosinophils: 353  taken on: 06/29/2021  Assess this condition today?: Yes  Gold grade: 1 (FEV1 > 80 %)  Gold group: A (low sx, < 2 exacerbations / yr)  Exacerbations in past year without hospitalization: No  Is patient currently Smoking or Vaping?: No  Did patient smoke/vape before?: No  Home oxygen therapy: No  Frequency of SABA/SAMA use: Never  Influenza vaccine: Yes  Prevnar vaccine: Yes  Pneumovax vaccine: Yes  We counseled the patient on:: Inhaler technique  Assessment:: Controlled  Drug: Breo Ellipta 100/25 mcg Inhale once daily  Assessment: Appropriate, Effective, Safe, Accessible  Plan to Counsel: Rinse mouth after each use  HC Follow up: N/A  Pharmacist Follow up: Assess breathing control, inhaler technique    BPH  Current PSA: 2.07  taken on: 06/29/2021  Assess this condition today?: Yes  Are you experiencing any side effects from your BPH medication?: None  Completing BPH AUA Questionnaire today?: No  How would you feel if you had to live with your urinary condition the way it is now, no better, no worse, for the rest of your life?: Pleased  We discussed: Limit fluid intake for 2-3 hours before bedtime, Avoid decongestants and antihistamines, Limiting caffeine intake  Assessment:: Controlled  Drug: Finasteride 5mg  QD  Assessment: Appropriate, Effective, Safe, Accessible  Drug: Doxazosin 8mg  QD  Assessment:  Appropriate, Effective, Safe, Accessible  HC Follow up: N/a  Pharmacist Follow up: Assess BPH symptoms    Cardiovascular Disease (CVD)  Assess this condition today?: Yes  Is Patient taking statin medication?: Yes  Is patient currently Smoking or Vaping?: No  Did patient smoke/vape before?: No  Is patient currently on antiplatelet therapy?: Yes  Is patient experiencing any abnormal bruising or bleeding?: No  Patient has the following: Coronary Artery Disease (CAD)  CAD: Prior events / interventions (include dates if available): stent to the RCA 2005, chronic RBBB, cath 2006 patent RCA stent with residual 80% diagonal and moderate LAD diagonal 1 disease.  Cath 2012  patent RCA stent, proximal LAD 60% unchanged LVEF 65% medical therapy, normal NST 07/2019  Does patient have an Rx for nitroglycerin?: Yes  When was the last time patient used nitroglycerin?: Patient has not had to use  Has patient experienced any chest pain?: No  We discussed: Proper administration of nitroglycerin, Warning signs of stroke, Warning signs of MI, Importance of controlling other comorbid conditions  Assessment:: Controlled  Drug: Aspirin 81mg  QD  Assessment: Appropriate, Effective, Safe, Accessible  Drug: Rosuvastatin 20mg  3 times a night  Assessment: Appropriate, Effective, Safe, Accessible  HC Follow up: N/A  Pharmacist Follow up: Pt to follow with cardiologist    Exercise, Diet and Non-Drug Coordination Needs  Additional exercise counseling points. We discussed: targeting at least 150 minutes per week of moderate-intensity aerobic exercise., incorporating flexibility, balance, and strength training exercises, decreasing sedentary behavior  Additional diet counseling points. We discussed: aiming to consume at least 8 cups of water day, key components of a low-carb eating plan, key components of the DASH diet, limiting alcohol intake  Discussed Non-Drug Care Coordination Needs: Yes  Does Patient have Medication  financial barriers?: Yes  Patient has insurance   Designer, fashion/clothing): BCBS  and has trouble affording medications   (list number): [name] 1.5mg  qweek  Their current copays are: Pt receiving through PAP  Goal: (pharmacist to fill in plan to resolve) i.e. change medications, samples, PAP.: Will fill out PAP for [name] for patient  Patient meets income/out of pocket spend criteria for this medications patient assistance program. Reviewed application process, patient will provide proof of income, out of pocket spend report, and will sign application.? Will collaborate with PCP, and will submit for patient.: Yes   Accountable Health Communities Health-Related Social Needs Screening Tool -   SDOH   ([email or web address])  What is your living situation today? (ref #1): I have a steady place to live  Think about the place you live. Do you have problems with any of the following? (ref #2): None of the  above  Within the past 12 months, you worried that your food would run out before you got money to buy more (ref #3): Never true  Within the past 12 months, the food you bought just didn't last and you didn't have money to get more (ref #4): Never true  In the past 12 months, has lack of reliable transportation kept you from medical appointments, meetings, work or from getting things needed for daily living? (ref #5): No  In the past 12 months, has the electric, gas, oil, or water company threatened to shut off services in your home? (ref #6): No  How often does anyone, including family and friends, physically hurt you? (ref #7): Never (1)  How often does anyone, including family and friends, insult or talk down to you? (ref #8): Never (1)  How often does anyone, including friends and family, threaten you with harm? (ref #9): Never (1)  How often does anyone, including family and friends, scream or curse at you? (ref #10): Never (1)   Engagement Notes   [name] on 07/27/2021 10:31 AM  CPP Chart Review: 30 min  CPP Office Visit: 39 min  CPP Office Visit Documentation: 51 min  CPP Coordination of Care: 0 min HC Care Plan Completion: 30 min CPP Care Plan Review: 15 min               Patient Name:?Jj Conley  DOB:??06-28-1951  ?  Last Care Plan Update:? 07/27/2021 COMPREHENSIVE CARE PLAN AND GOALS?    HYPERTENSION   MOST RECENT BLOOD PRESSURE:      130/75  MY GOAL BLOOD PRESSURE:   <130/80 mmHG  CURRENT MEDICATION AND DOSING:   Ramipril 4mg  daily, Doxazosin 8mg  daily  THE GOALS WE HAVE CHOSEN ARE:     -Maintain an at goal blood pressure   BARRIERS TO ACHIEVING GOALS:   -At goal  PLAN TO WORK ON THESE GOALS:   -Take medications regularly    -Check BP at home   -Reduce salt intake (< 1500mg / day)   -Diet: DASH diet (Choose fruits, vegetables, and low-fat dairy products. Increase whole grains, fish, poultry, nuts. Reduce red meats and sugars)   -Weight: 1 kg = ~1 mmHg reduction   -Exercise- Targeting 150 minutes of aerobic activity per week, Proper Home BP Measurement  .  CHOLESTEROL   MOST RECENT LABS:    06/29/2021  -TOTAL CHOLESTEROL: 94  -TRIGLYCERIDES: 42  -HDL: 45  -LDL: 37  CURRENT MEDICATION AND DOSING:   Rosuvastatin 20mg  take 1 tablet 3 times a week  THE GOALS WE HAVE CHOSEN ARE:     -Total Cholesterol goal under 200, Triglycerides goal under 150, HDL goal above 40, LDL goal under 100   BARRIERS TO ACHIEVING GOALS:   -At Goal  PLAN TO WORK ON THESE GOALS:   -Take medication regularly   -Diet: high in plant sterols (fruits/ vegetables/ nuts/ whole grains/ legumes). Increase fiber intake (10-25g/day). Avoid foods high in cholesterol (red meat, egg yolks, dairy, oils/ butter). Choose low-fat options.   -How to reduce cholesterol through diet/weight management and physical activity., How a diet high in plant sterols (fruits/vegetables/nuts/whole grains/legumes) may reduce your cholesterol.,  Encouraged increasing fiber to a daily intake of 10-25g/day  -Exercise   -Weight Management  .    COPD   EOSINOPHIL COUNT:      -EOS %: 353 on 06/29/2021  CURRENT MEDICATION AND DOSING:   Breo Ellipta 100/25 mcg Inhale once daily  THE GOALS WE HAVE CHOSEN ARE:   -Reduce shortness of breath and reduce hospitalizations due to COPD exacerbations   BARRIERS TO ACHIEVING GOALS:   -Controlled PLAN TO WORK ON THESE GOALS:     -Use inhalers regularly as directed   -Educated on inhaler usage and discussed technique   -Educated on ways to prevent exacerbation of COPD   -Stay up to date on influenza and pneumococcal immunizations     -Call CPP or MD immediately if: more coughing, wheezing, SOB than usual; changes in color, thickness or amount of mucus; feeling tired for more than one day; swelling of legs or ankles; trouble sleeping; feeling the need to increase oxygen (low oxygen levels on pulse ox)   -Call 911: severe shortness of breath or chest pain; blue color in lips, fingers; confusion, disorientation, difficulty speaking in full sentences     ?DIABETES   MOST RECENT A1C:   6.8 on 06/29/2021  MY GOAL A1C:   <7.0  CURRENT MEDICATION AND DOSING:  [name] 0.75mg  weekly, Glimepiride 4mg  take 1/2-1 tablet 2 times a day,   Metformin 500mg  2 tablets 2 times a day - Pt not taking  THE GOALS WE HAVE CHOSEN ARE:    -Maintain an at goal A1C level   BARRIERS TO ACHIEVING GOALS:   -At Goal/Controlled  PLAN TO WORK ON THESE GOALS:   -Take medications as prescribed   -Check blood glucose daily   -Diet: natural carbs and sugars (vegetables, fruits, whole grains, legumes, dairy), avoid alcohol, reduce carbohydrate intake and processed sugars. Maintain a low carbohydrate diet, eating 45 grams of carbohydrates per meal (3 servings of carbohydrates per meal), 15 grams of carbohydrates per snack (1 serving of carbohydrate per snack)   -Weight Loss: waist circumference goal <  35 inches for females; < 40 inches for males   -Exercise: 150 minutes of moderate-intensity aerobic activity per week (at least 3 days)     -Discussed Technique: testing / injection as applicable   -Foot care: wash, dry examine feet daily. Moisturize the top and bottom (not in between). Trim nails with nail file (no sharp edges). Wear socks and shoes. Elevate feet when sitting. Annual foot exam by podiatrist.     -Recognize sign and symptoms of hypoglycemia and understand treatment as outlined below   -Hypoglycemia sign and symptoms: dizziness, anxiety/ irritability, shakiness, headache, diaphoresis, hunger, confusion, nausea, ataxia, tremors, palpitations/ tachycardia, blurred vision   -Hypoglycemia Treatment: Rule of 15:    o (1) 15-20 grams of glucose/ simple carb (4 oz juice, 8oz milk, 4oz regular soda, 1 tablespoon sugar/ honey/ corn syrup, 3-4 glucose tabs/ 1 serving glucose gel)   o (2) Recheck BG after 15 mins. If hypoglycemia continues   o (3) Repeat steps 1&2, when BG normalizes, eat small meal or snack   . BPH  CURRENT REGIMEN AND DOSING: Finasteride 5mg  daily, Doxazosin 8mg  daily THE GOALS WE HAVE CHOSEN ARE: At Goal  BARRIERS TO ACHIEVING GOALS: Controlled  PLAN TO WORK ON THESE GOALS:  Limit fluid intake for 2-3 hours before bedtime, Avoid decongestants and antihistamines, Limiting caffeine intake     Cardiovascular Disease   CURRENT REGIMEN AND DOSING: Aspirin 81mg  daily, Rosuvastatin 20mg  3 times a night  THE GOALS WE HAVE CHOSEN ARE: At Goal  BARRIERS TO ACHIEVING GOALS: Controlled  PLAN TO WORK ON THESE GOALS: Control other comorbid conditions and follow with cardiologist  ACTIVE MEDICATION LIST   Your current medication list has been updated. To view, log in to your patient portal.   Call if any changes need to be made.      MEDICATION REVIEW   MEDICATION REVIEW CONDUCTED:   Yes    DATE:    07/27/2021 BEST POSSIBLE MEDICATION HISTORY  SOURCE:   Medical  Records  ??    HOW DO I? - WHEN DO I?     GET AHOLD OF MY DOCTOR?    DURING BUSINESS HOURS WHEN THE OFFICE IS OPEN    PHONE: [phone]  AFTER HOURS [facility] NURSE WHEN THE OFFICE IS CLOSED   PHONE: [phone]   TALK TO [place] CARE COORDINATOR NAME: [name]   PHONE: [phone]  EMAIL:  [email or web address]  CARE COORDINATOR STAFF   NAME: Rosary Lively, Vanetta Shawl, [place]   PHONE: [phone]    NOTE SECTION   Thank you for participating in the Chronic Care Management (CCM) program with Dr. Melford Aase     This program takes a proactive approach to your health and my team will serve as a resource for you throughout the year. Please follow up at [phone] if you have any questions or experience changes to your overall health. Your next CCM appointment will be conducted with [name], PharmD as follows:      Date:  05/23/2022 Time:  9am  Over the Phone   ?  Rachelle Hora [name], PharmD  Clinical Pharmacist  [email or web address]  [phone]

## 2021-08-18 DIAGNOSIS — L820 Inflamed seborrheic keratosis: Secondary | ICD-10-CM | POA: Diagnosis not present

## 2021-09-07 DIAGNOSIS — H2513 Age-related nuclear cataract, bilateral: Secondary | ICD-10-CM | POA: Diagnosis not present

## 2021-09-07 DIAGNOSIS — E119 Type 2 diabetes mellitus without complications: Secondary | ICD-10-CM | POA: Diagnosis not present

## 2021-09-07 DIAGNOSIS — H524 Presbyopia: Secondary | ICD-10-CM | POA: Diagnosis not present

## 2021-09-07 LAB — HM DIABETES EYE EXAM

## 2021-09-28 ENCOUNTER — Encounter: Payer: Self-pay | Admitting: Internal Medicine

## 2021-09-28 NOTE — Progress Notes (Signed)
Future Appointments  Date Time Provider Department  09/29/2021 10:30 AM [name], MD [facility]  11/30/2021  3:15 PM Marshell Garfinkel, MD [facility]  03/29/2022                Wellness  11:00 AM [name], NP [facility]  05/23/2022  9:00 AM [name], [facility]  06/29/2022                   CPE 10:00 AM Magda Bernheim, NP [facility]    History of Present Illness:       This very nice 71 y.o.  MWM presents for 3 month follow up with HTN, HLD, T2_NIDDM and Vitamin D Deficiency.  Patient has COPD overlap OSA.  Chest CT scan in Apr 2018 showed  Aortic Atherosclerosis.       Patient is treated for HTN  circa 2013 & BP has been controlled at home. Todays BP is at goal - 137/77.  In 2005, patient presented with ACS and had Stents placed. Patient has had no complaints of any cardiac type chest pain, palpitations, dyspnea / orthopnea / PND, dizziness, claudication, or dependent edema.       Hyperlipidemia is controlled with diet & meds. Patient denies myalgias or other med SEs. Last Lipids were at goal :  Lab Results  Component Value Date   CHOL 94 06/29/2021   HDL 45 06/29/2021   LDLCALC 37 06/29/2021   TRIG 42 06/29/2021   CHOLHDL 2.1 06/29/2021     Also, the patient has history of T2_NIDDM  predating since 2005 and has CKD1 (GFR 98) and has had no symptoms of reactive hypoglycemia, diabetic polys, paresthesias or visual blurring.  Last A1c was not at goal :  Lab Results  Component Value Date   HGBA1C 6.8 (H) 06/29/2021        Further, the patient also has history of Vitamin D Deficiency and supplements vitamin D without any suspected side-effects. Last vitamin D was at goal :  Lab Results  Component Value Date   VD25OH 81 06/29/2021     Current Outpatient Medications on File Prior to Visit  Medication Sig   acetaminophen (TYLENOL) 500 MG tablet Take 2 tablets every 6  hours.   aspirin EC 81 MG tablet Take 1 tablet daily.   VITAMIN D 2,000  u  Take 10,000 Units daily   Cinnamon 500 MG TABS Take 1 tablet in the morning and at bedtime.   doxazosin (CARDURA) 8 MG tablet Take  1 tablet  at Bedtime     [name] 1.5 UE/4.5WU  Inject 1.5 mg into the skin once a week.   fexofenadine (ALLEGRA) 60 MG tablet Take daily as needed    finasteride (PROSCAR) 5 MG tablet Take 1 tablet daily for prostate.   BREO ELLIPTA  INHALE 1 PUFF  DAILY    glimepiride  4 MG tablet TAKE 1/2-1 TABLET TWICE DAILY    NITROSTAT 0.4 MG SL tablet as needed for chest pain.   ramipril  5 MG capsule Take 1 capsule   Daily     rOPINIRole 3 MG tablet Take one tablet three times a day    rosuvastatin  20 MG tablet Take 1 tab three nights a week     No Known Allergies   PMHx:   Past Medical History:  Diagnosis Date   CAD (coronary artery disease)    a. s/p stent to RCA 2005; b. [place] 2006 after Nuc with inf ischemia: patent  RCA stent, D1 80%, LAD 60% => med Rx;  c. LHC 04/18/11: Proximal LAD 60% (unchanged from previous catheterization), distal LAD 65% (small caliber), Dx 60%, proximal CFX 20%, mid CFX 50%, OM1 30%, proximal RCA 20%, mid RCA stent patent, EF 65%.=> med Rx   GERD (gastroesophageal reflux disease)    HLD (hyperlipidemia)    Hypertension    Obesity    OSA (obstructive sleep apnea)    has a cpap-does not use-has a special mouthpiece he wears at night   Type II or unspecified type diabetes mellitus without mention of complication, not stated as uncontrolled    Vitamin D deficiency    Wears glasses      Immunization History  Administered Date(s) Administered   Influenza, High Dose  05/15/2019, 07/28/2020, 06/29/2021   Influenza 06/02/2013, 08/18/2014   PFIZER SARS-COV-2 Vacc 11/19/2019, 12/10/2019   Pneumococcal -13 11/21/2016   Pneumococcal -23 08/27/2007, 01/01/2018   Td 01/01/2018   Tdap 08/27/2007   Zoster, Live 10/20/2013     Past Surgical History:  Procedure Laterality Date   CARDIAC CATHETERIZATION  2005   stent   CHONDROPLASTY   06/25/2017   Procedure: Matilde SprangDorna Leitz, MD   COLONOSCOPY  11/2011   Due 5 years Dr. Fuller Plan   heart stent  2005   RCA   [place] Right 11/16/2014   RIGHT INGUINAL HERNIA REPAIR WITH MESH;  Armandina Gemma, MD   INSERTION OF MESH Right 11/16/2014   INSERTION OF MESH;  Armandina Gemma, MD   KNEE ARTHROSCOPY W/MEDIAL MENISECTOMY Bilateral 06/25/2017   right knee arthroscopy with medial meniscectomy, chondroplasty, Left knee arthroscopy with medial menisectomy removal of plica and chondroplasty; Dorna Leitz, MD   POLYPECTOMY      FHx:    Reviewed / unchanged  SHx:    Reviewed / unchanged   Systems Review:  Constitutional: Denies fever, chills, wt changes, headaches, insomnia, fatigue, night sweats, change in appetite. Eyes: Denies redness, blurred vision, diplopia, discharge, itchy, watery eyes.  ENT: Denies discharge, congestion, post nasal drip, epistaxis, sore throat, earache, hearing loss, dental pain, tinnitus, vertigo, sinus pain, snoring.  CV: Denies chest pain, palpitations, irregular heartbeat, syncope, dyspnea, diaphoresis, orthopnea, PND, claudication or edema. Respiratory: denies cough, dyspnea, DOE, pleurisy, hoarseness, laryngitis, wheezing.  Gastrointestinal: Denies dysphagia, odynophagia, heartburn, reflux, water brash, abdominal pain or cramps, nausea, vomiting, bloating, diarrhea, constipation, hematemesis, melena, hematochezia  or hemorrhoids. Genitourinary: Denies dysuria, frequency, urgency, nocturia, hesitancy, discharge, hematuria or flank pain. Musculoskeletal: Denies arthralgias, myalgias, stiffness, jt. swelling, pain, limping or strain/sprain.  Skin: Denies pruritus, rash, hives, warts, acne, eczema or change in skin lesion(s). Neuro: No weakness, tremor, incoordination, spasms, paresthesia or pain. Psychiatric: Denies confusion, memory loss or sensory loss. Endo: Denies change in weight, skin or hair change.  Heme/Lymph: No excessive bleeding,  bruising or enlarged lymph nodes.  Physical Exam  BP 137/77    Pulse 76    Temp 97.9 F (36.6 C)    Resp 16    Ht 5\' 10"  (1.778 m)    Wt 199 lb 6.4 oz (90.4 kg)    SpO2 96%    BMI 28.61 kg/m   Appears  well nourished, well groomed  and in no distress.  Eyes: PERRLA, EOMs, conjunctiva no swelling or erythema. Sinuses: No frontal/maxillary tenderness ENT/Mouth: EAC's clear, TM's nl w/o erythema, bulging. Nares clear w/o erythema, swelling, exudates. Oropharynx clear without erythema or exudates. Oral hygiene is good. Tongue normal, non obstructing. Hearing intact.  Neck: Supple.  Thyroid not palpable. Car 2+/2+ without bruits, nodes or JVD. Chest: Respirations nl with BS clear & equal w/o rales, rhonchi, wheezing or stridor.  Cor: Heart sounds normal w/ regular rate and rhythm without sig. murmurs, gallops, clicks or rubs. Peripheral pulses normal and equal  without edema.  Abdomen: Soft & bowel sounds normal. Non-tender w/o guarding, rebound, hernias, masses or organomegaly.  Lymphatics: Unremarkable.  Musculoskeletal: Full ROM all peripheral extremities, joint stability, 5/5 strength and normal gait.  Skin: Warm, dry without exposed rashes, lesions or ecchymosis apparent.  Neuro: Cranial nerves intact, reflexes equal bilaterally. Sensory-motor testing grossly intact. Tendon reflexes grossly intact.  Pysch: Alert & oriented x 3.  Insight and judgement nl & appropriate. No ideations.  Assessment and Plan:  1. Essential hypertension  - Continue medication, monitor blood pressure at home.  - Continue DASH diet.  Reminder to go to the ER if any CP,  SOB, nausea, dizziness, severe HA, changes vision/speech.   - CBC with Differential/Platelet - COMPLETE METABOLIC PANEL WITH GFR - Magnesium - TSH  2. Hyperlipidemia associated with type 2 diabetes mellitus ([place])  - Continue diet/meds, exercise,& lifestyle modifications.  - Continue monitor periodic cholesterol/liver & renal functions     - Lipid panel - TSH  3. Type 2 diabetes mellitus with stage 1 chronic kidney  disease, without long-term current use of insulin ([facility]CC)  - Continue diet, exercise  - Lifestyle modifications.  - Monitor appropriate labs   - Hemoglobin A1c - Insulin, random  4. Vitamin D deficiency  - Continue supplementation    - VITAMIN D 25 Hydroxy   5. Aortic atherosclerosis ([place]) by Chest CT scan 11/2016  - Lipid panel  6. Medication management  - CBC with Differential/Platelet - COMPLETE METABOLIC PANEL WITH GFR - Magnesium - Lipid panel - TSH - Hemoglobin A1c - Insulin, random - VITAMIN D 25 Hydroxy         Discussed  regular exercise, BP monitoring, weight control to achieve/maintain BMI less than 25 and discussed med and SE's. Recommended labs to assess and monitor clinical status with further disposition pending results of labs.  I discussed the assessment and treatment plan with the patient. The patient was provided an opportunity to ask questions and all were answered. The patient agreed with the plan and demonstrated an understanding of the instructions.  I provided over 30 minutes of exam, counseling, chart review and  complex critical decision making.         The patient was advised to call back or seek an in-person evaluation if the symptoms worsen or if the condition fails to improve as anticipated.   Kirtland Bouchard, MD

## 2021-09-28 NOTE — Patient Instructions (Signed)

## 2021-09-29 ENCOUNTER — Other Ambulatory Visit: Payer: Self-pay

## 2021-09-29 ENCOUNTER — Ambulatory Visit (INDEPENDENT_AMBULATORY_CARE_PROVIDER_SITE_OTHER): Payer: Medicare Other | Admitting: Internal Medicine

## 2021-09-29 ENCOUNTER — Encounter: Payer: Self-pay | Admitting: Internal Medicine

## 2021-09-29 VITALS — BP 137/77 | HR 76 | Temp 97.9°F | Resp 16 | Ht 70.0 in | Wt 199.4 lb

## 2021-09-29 DIAGNOSIS — E1122 Type 2 diabetes mellitus with diabetic chronic kidney disease: Secondary | ICD-10-CM | POA: Diagnosis not present

## 2021-09-29 DIAGNOSIS — I700 Atherosclerosis of aorta: Secondary | ICD-10-CM

## 2021-09-29 DIAGNOSIS — N401 Enlarged prostate with lower urinary tract symptoms: Secondary | ICD-10-CM

## 2021-09-29 DIAGNOSIS — Z79899 Other long term (current) drug therapy: Secondary | ICD-10-CM

## 2021-09-29 DIAGNOSIS — E1169 Type 2 diabetes mellitus with other specified complication: Secondary | ICD-10-CM | POA: Diagnosis not present

## 2021-09-29 DIAGNOSIS — E559 Vitamin D deficiency, unspecified: Secondary | ICD-10-CM | POA: Diagnosis not present

## 2021-09-29 DIAGNOSIS — N320 Bladder-neck obstruction: Secondary | ICD-10-CM

## 2021-09-29 DIAGNOSIS — G2581 Restless legs syndrome: Secondary | ICD-10-CM

## 2021-09-29 DIAGNOSIS — E785 Hyperlipidemia, unspecified: Secondary | ICD-10-CM

## 2021-09-29 DIAGNOSIS — I10 Essential (primary) hypertension: Secondary | ICD-10-CM | POA: Diagnosis not present

## 2021-09-29 DIAGNOSIS — N181 Chronic kidney disease, stage 1: Secondary | ICD-10-CM

## 2021-09-29 MED ORDER — ROPINIROLE HCL 3 MG PO TABS: ORAL_TABLET | ORAL | 1 refills | Status: DC

## 2021-09-29 MED ORDER — FINASTERIDE 5 MG PO TABS: ORAL_TABLET | ORAL | 1 refills | Status: DC

## 2021-09-29 MED ORDER — DOXAZOSIN MESYLATE 8 MG PO TABS: ORAL_TABLET | ORAL | 3 refills | Status: DC

## 2021-09-29 MED ORDER — ROSUVASTATIN CALCIUM 20 MG PO TABS: ORAL_TABLET | ORAL | 1 refills | Status: DC

## 2021-09-30 LAB — INSULIN, RANDOM: Insulin: 8.8 u[IU]/mL

## 2021-09-30 LAB — LIPID PANEL
Cholesterol: 109 mg/dL (ref <200)
HDL: 40 mg/dL (ref >=40)
LDL Cholesterol (Calc): 54 mg/dL (calc)
Non-HDL Cholesterol (Calc): 69 mg/dL (calc) (ref <130)
Total CHOL/HDL Ratio: 2.7 (calc) (ref <5.0)
Triglycerides: 69 mg/dL (ref <150)

## 2021-09-30 LAB — COMPLETE METABOLIC PANEL WITH GFR
AG Ratio: 1.5 (calc) (ref 1.0–2.5)
ALT: 21 U/L (ref 9–46)
AST: 17 U/L (ref 10–35)
Albumin: 4 g/dL (ref 3.6–5.1)
Alkaline phosphatase (APISO): 91 U/L (ref 35–144)
BUN: 11 mg/dL (ref 7–25)
CO2: 24 mmol/L (ref 20–32)
Calcium: 9.3 mg/dL (ref 8.6–10.3)
Chloride: 104 mmol/L (ref 98–110)
Creat: 0.84 mg/dL (ref 0.70–1.28)
Globulin: 2.6 g/dL (calc) (ref 1.9–3.7)
Glucose, Bld: 131 mg/dL — ABNORMAL HIGH (ref 65–99)
Potassium: 4.2 mmol/L (ref 3.5–5.3)
Sodium: 138 mmol/L (ref 135–146)
Total Bilirubin: 0.4 mg/dL (ref 0.2–1.2)
Total Protein: 6.6 g/dL (ref 6.1–8.1)
eGFR: 94 mL/min/{1.73_m2} (ref >=60)

## 2021-09-30 LAB — CBC WITH DIFFERENTIAL/PLATELET
Absolute Monocytes: 593 cells/uL (ref 200–950)
Basophils Absolute: 53 cells/uL (ref 0–200)
Basophils Relative: 0.7 %
Eosinophils Absolute: 403 cells/uL (ref 15–500)
Eosinophils Relative: 5.3 %
HCT: 41.5 % (ref 38.5–50.0)
Hemoglobin: 13.8 g/dL (ref 13.2–17.1)
Lymphs Abs: 1839 cells/uL (ref 850–3900)
MCH: 31.2 pg (ref 27.0–33.0)
MCHC: 33.3 g/dL (ref 32.0–36.0)
MCV: 93.7 fL (ref 80.0–100.0)
MPV: 11.8 fL (ref 7.5–12.5)
Monocytes Relative: 7.8 %
Neutro Abs: 4712 cells/uL (ref 1500–7800)
Neutrophils Relative %: 62 %
Platelets: 165 10*3/uL (ref 140–400)
RBC: 4.43 10*6/uL (ref 4.20–5.80)
RDW: 11.9 % (ref 11.0–15.0)
Total Lymphocyte: 24.2 %
WBC: 7.6 10*3/uL (ref 3.8–10.8)

## 2021-09-30 LAB — TSH: TSH: 0.8 mIU/L (ref 0.40–4.50)

## 2021-09-30 LAB — VITAMIN D 25 HYDROXY (VIT D DEFICIENCY, FRACTURES): Vit D, 25-Hydroxy: 88 ng/mL (ref 30–100)

## 2021-09-30 LAB — HEMOGLOBIN A1C
Hgb A1c MFr Bld: 7.9 % of total Hgb — ABNORMAL HIGH (ref <5.7)
Mean Plasma Glucose: 180 mg/dL
eAG (mmol/L): 10 mmol/L

## 2021-09-30 LAB — MAGNESIUM: Magnesium: 1.8 mg/dL (ref 1.5–2.5)

## 2021-09-30 NOTE — Progress Notes (Signed)
=============================================================== °=============================================================== ° °-    A1c 12 week average glucose is much worse   - has gone up from 6.8%  ( which was too high )  to now 7.9%   - Please reStart your Metformin so you won't develop bad complications of    high Sugar like Heart Attack, Stroke , Blindness, Kidney failure   And Diet is still very important    Being diabetic has a  300% increased risk for heart attack,  stroke, cancer, and alzheimer- type vascular dementia.   It is very important that you work harder with diet by  avoiding all foods that are white except chicken,   fish & calliflower.  - Avoid white rice  (brown & wild rice is OK),   - Avoid white potatoes  (sweet potatoes in moderation is OK),   White bread or wheat bread or anything made out of   white flour like bagels, donuts, rolls, buns, biscuits, cakes,  - pastries, cookies, pizza crust, and pasta (made from  white flour & egg whites)   - vegetarian pasta or spinach or wheat pasta is OK.  - Multigrain breads like Arnold's, Pepperidge Farm or   multigrain sandwich thins or high fiber breads like   [place] bread or "Dave's Killer" breads that are  4 to 5 grams fiber per slice !  are best.    - Diet, exercise and weight loss is very important in the   control and prevention of complications of diabetes which  affects every system in your body, ie.   -Brain - dementia/stroke,  - eyes - glaucoma/blindness,  - heart - heart attack/heart failure,  - kidneys - dialysis,  - stomach - gastric paralysis,  - intestines - malabsorption,  - nerves - severe painful neuritis,  - circulation - gangrene & loss of a leg(s)  - and finally  . . . . . . . . . . . . . . . . . .    - cancer and Alzheimers. =============================================================== ===============================================================  -   Magnesium  -    1.8   -  very  low- goal is betw 2.0 - 2.5,   - So..............Marland Kitchen  Recommend that you take  Magnesium 500 mg tablet daily   - also important to eat lots of  leafy green vegetables   - spinach - Kale - collards - greens - okra - asparagus  - broccoli - quinoa - squash - almonds   - black, red, white beans  -  peas - green beans =============================================================== ===============================================================  -  Total Chol = 109 - Wonderful -  Excellent   - Very low risk for Heart Attack  / Stroke ============================================================ ============================================================  -  Vitamin D = 88 - Also Excellent - Please keep dose same  =============================================================== ===============================================================  -  All Else - CBC - Kidneys - Electrolytes - Liver  & Thyroid    - all  Normal / OK =============================================================== ===============================================================

## 2021-10-01 ENCOUNTER — Encounter: Payer: Self-pay | Admitting: Internal Medicine

## 2021-11-30 ENCOUNTER — Ambulatory Visit: Payer: Medicare Other | Admitting: Pulmonary Disease

## 2021-11-30 ENCOUNTER — Encounter: Payer: Self-pay | Admitting: Pulmonary Disease

## 2021-11-30 VITALS — BP 136/60 | HR 90 | Temp 98.2°F | Ht 70.0 in | Wt 197.8 lb

## 2021-11-30 DIAGNOSIS — J849 Interstitial pulmonary disease, unspecified: Secondary | ICD-10-CM

## 2021-11-30 DIAGNOSIS — J449 Chronic obstructive pulmonary disease, unspecified: Secondary | ICD-10-CM | POA: Diagnosis not present

## 2021-11-30 MED ORDER — VARENICLINE TARTRATE 0.5 MG X 11 & 1 MG X 42 PO TBPK: ORAL_TABLET | ORAL | 0 refills | Status: DC

## 2021-11-30 MED ORDER — NICOTINE 21-14-7 MG/24HR TD KIT: 1.0000 | PACK | Freq: Every day | TRANSDERMAL | 0 refills | Status: DC

## 2021-11-30 NOTE — Patient Instructions (Signed)
We will prescribe Chantix and nicotine for smoking cessation ?Order follow-up high-res CT in September 2023.  Return to clinic after CT scan ?

## 2021-11-30 NOTE — Progress Notes (Signed)
? ?      ?Joe Green    [id number]    30-May-1951 ? ?Primary Care Physician:McKeown, Gwyndolyn Saxon, MD ? ?Referring Physician: [name], MD ?[address] ?[address] ?[place] [address] ? ?Chief complaint: Follow-up for abnormal CT, ILD evaluation ? ?HPI: ?71 year old with history of diabetes, sleep apnea referred for evaluation of abnormal CT with concern for ILD ?He has chronic cough with mild dyspnea on exertion. ? ?Pets: No pets ?Occupation: Retired Chief of Staff ?ILD questionnaire for exposures 04/11/21-incompletely filled.  Denies any exposures ?Smoking history: 40-pack-year smoker.  Continues to smoke half pack per day ?Travel history: No significant travel history ?Relevant family history: No family history of lung disease ? ?Interim history: ?Discussed at ILD conference today 05/25/11.  CT findings are felt to be alternate pattern due to predominant nature of groundglass opacities over fibrotic changes and no clear honeycombing.  Changes at the lung base felt to be more consistent with bullous emphysema than honeycombing ? ?Referral was made to cardiothoracic surgery for lung biopsy but after meeting with Dr. Roxan Hockey he changes mind and decided against lung biopsy.  He wants to continue monitoring ? ?States that breathing is stable ? ?Outpatient Encounter Medications as of 11/30/2021  ?Medication Sig  ? acetaminophen (TYLENOL) 500 MG tablet Take 2 tablets (1,000 mg total) by mouth every 6 (six) hours.  ? aspirin EC 81 MG tablet Take 1 tablet (81 mg total) by mouth daily.  ? blood glucose meter kit and supplies Dispense based on patient and insurance preference. Use up to four times daily as directed. (FOR ICD-9 250.00, 250.01).  ? Cholecalciferol (VITAMIN D) 50 MCG (2000 UT) tablet Take 1 tablet (2,000 Units total) by mouth daily. (Patient taking differently: Take 10,000 Units by mouth daily.)  ? Cinnamon 500 MG TABS Take 1 tablet by mouth in the morning and at bedtime.  ?  doxazosin (CARDURA) 8 MG tablet Take  1 tablet  at Bedtime  for BP & Prostate  ? Dulaglutide ([name]) 1.5 DJ/2.4QA SOPN Inject 1.5 mg into the skin once a week.  ? fexofenadine (ALLEGRA) 60 MG tablet Take 60 mg by mouth daily as needed for allergies or rhinitis.  ? finasteride (PROSCAR) 5 MG tablet Take 1 tablet daily for prostate.  ? fluticasone furoate-vilanterol (BREO ELLIPTA) 100-25 MCG/ACT AEPB INHALE 1 PUFF BY MOUTH ONCE DAILY RINSE  MOUTH  WITH  WATER  AFTER  EACH  USE  ? glimepiride (AMARYL) 4 MG tablet TAKE 1/2-1 TABLET BY MOUTH TWICE DAILY WITH MEALS FOR DIABETES  ? glucose blood test strip Test blood sugars once daily.Dx: E11.9  ? metFORMIN (GLUCOPHAGE) 1000 MG tablet Take  1 tablet  2 x /day with Meals  for Diabetes  / Patient knows to take by mouth  ? ramipril (ALTACE) 5 MG capsule Take 1 capsule   Daily  For BP  & Diabetic Kidney Protection (Dx: e11.29)  ? rOPINIRole (REQUIP) 3 MG tablet Take one tablet three times a day for restless legs.  ? rosuvastatin (CRESTOR) 20 MG tablet Take 1 tab three nights a week for cholesterol.  ? nitroGLYCERIN (NITROSTAT) 0.4 MG SL tablet Place 1 tablet (0.4 mg total) under the tongue every 5 (five) minutes as needed for chest pain. (Patient not taking: Reported on 11/30/2021)  ? ?Facility-Administered Encounter Medications as of 11/30/2021  ?Medication  ? 0.9 %  sodium chloride infusion  ? ? ?Physical Exam: ?Blood pressure 136/60, pulse 90, temperature 98.2 ?F (36.8 ?C), temperature  source Oral, height _0  (1.778 m), weight 197 lb 12.8 oz (89.7 kg), SpO2 95 %. ?Gen:      No acute distress ?HEENT:  EOMI, sclera anicteric ?Neck:     No masses; no thyromegaly ?Lungs:    Clear to auscultation bilaterally; normal respiratory effort ?CV:         Regular rate and rhythm; no murmurs ?Abd:      + bowel sounds; soft, non-tender; no palpable masses, no distension ?Ext:    No edema; adequate peripheral perfusion ?Skin:      Warm and dry; no rash ?Neuro: alert and oriented x  3 ?Psych: normal mood and affect  ? ?Data Reviewed: ?Imaging: ?Screening CT chest 12/15/2016-centrilobular and paraseptal emphysema.  Minimal subpleural reticulation. ?Screening CT chest 02/18/2021-centrilobular and paraseptal emphysema, bibasal subpleural reticulation and groundglass with honeycombing.  ?High-resolution CT 05/04/2021-peripheral and basilar predominant groundglass with minimal subpleural reticulation, alternate diagnosis. ? I have reviewed the images personally. ? ?PFTs: ?05/20/2021 ?FVC 4.24 [96%], FEV1 3.05 [94%], F/F 72, TLC 6.77 [97%], DLCO 19.86 [77%] ?Minimal diffusion defect ? ?Labs: ?CTD serologies 03/22/21-significant only for rheumatoid factor of 18 ? ?Assessment:  ?Interstitial lung disease, pulmonary fibrosis ?CT scan reviewed at ILD conference and findings is consistent with alternate diagnosis as there is predominant groundglass with no honeycombing.  See discussion above ? ?We discussed options including biopsy with either bronchoscopy with ENVISIA or surgical lung biopsy, watchful waiting or empiric treatment with antifibrotics.  Since his breathing is doing well he would like to avoid any intervention at present and prefers conservative management with monitoring ? ?Schedule high-res CT and PFTs in September ? ?Active smoker ?Discussed smoking cessation.  Start Chantix and nicotine patches ?Time spent counseling-5 minutes.  Reassess at return visit ? ?OSA.   ?He was diagnosed with sleep apnea in 2014 but has not kept up with CPAP due to difficulties with the mask.  He feels that his sleep apnea is better and does not want to reassess. ? ?Plan/Recommendations: ?Conservative management ?Follow-up in September with high-res CT and PFTs ?Chantix and nicotine patches for smoking cessation ? ?Marshell Garfinkel MD ? Pulmonary and Critical Care ?11/30/2021, 3:33 PM ? ?CC: [name], MD ? ?  ?

## 2022-01-26 ENCOUNTER — Telehealth: Payer: Self-pay

## 2022-01-26 NOTE — Telephone Encounter (Signed)
Can you please contact this patient regarding his [name]. The patient reports that he has not had any medication in three months.   Thank you

## 2022-01-27 ENCOUNTER — Telehealth: Payer: Self-pay

## 2022-01-27 NOTE — Telephone Encounter (Signed)
[employer] and patient has been unenrolled from PAP program as of 11/15/21 for unknown reasons. Patient updated and asked to call [name] to resolve issue if this was a mistake.

## 2022-01-27 NOTE — Telephone Encounter (Signed)
LM-01/27/22-Called pt. And informed he was unenrolled on April, 4th, 5615 for his [name] and inform pt. he needs to call [facility] at [phone] for more information. Pt. Verbalized understanding and agreed. Let pt. Know if he needs any further assistance to contact [phone]. PT. Verbalized understanding and agreed.  Total time spent: 5 min.

## 2022-02-02 NOTE — Telephone Encounter (Signed)
LM-02/02/22-Calling pt to ensure he was able to get assistance from [facility] at [phone]. Pt. Informed me that he is currently out of town. Pt. Has not heard back from [facility] yet. Pt. Gave verbal consent for me to call and see if pt. Is able to get assistance. Informed pt. I will call him w/ an update as soon as possible. Pt verbalized understanding and agreed.  Total time spent: 5 min.

## 2022-02-02 NOTE — Telephone Encounter (Signed)
LM-02/02/22-Searching for assistance for patient to have access to [name]. Went to [employer] for PAP and the website stated they are not taking applications at this time. Searching for other assistance. Submitted application w/ [email or web address] for [name]. Per website, pt. Will be reached within 48 hours. Will follow up w/ pt. Next week to see if pt. Was approved.  Total time spent: 14 min.

## 2022-02-10 DIAGNOSIS — E785 Hyperlipidemia, unspecified: Secondary | ICD-10-CM | POA: Diagnosis not present

## 2022-02-10 DIAGNOSIS — I10 Essential (primary) hypertension: Secondary | ICD-10-CM | POA: Diagnosis not present

## 2022-02-10 DIAGNOSIS — I2510 Atherosclerotic heart disease of native coronary artery without angina pectoris: Secondary | ICD-10-CM | POA: Diagnosis not present

## 2022-02-10 DIAGNOSIS — E1169 Type 2 diabetes mellitus with other specified complication: Secondary | ICD-10-CM | POA: Diagnosis not present

## 2022-02-21 ENCOUNTER — Other Ambulatory Visit: Payer: Self-pay | Admitting: Internal Medicine

## 2022-03-01 ENCOUNTER — Telehealth: Payer: Self-pay

## 2022-03-01 ENCOUNTER — Other Ambulatory Visit: Payer: Self-pay | Admitting: Nurse Practitioner

## 2022-03-01 ENCOUNTER — Telehealth: Payer: Self-pay | Admitting: Internal Medicine

## 2022-03-01 NOTE — Progress Notes (Unsigned)
[facility])  [place] Team    03/01/2022  Joe Green 13-Aug-1951 [id number]  Reason for referral: Medication Assistance with Joe  Referral source:  Clinical staff  Current insurance: [employer]   Outreach:  Successful telephone call with Joe Green.  HIPAA identifiers verified.   Subjective:  [facility] Pharmacist received a referral for medication assistance with Joe to help with re-enrollment. Initially Joe Green, CMA and pharmacist Joe Green were working with patient regarding re-enrollment. It appears his enrollment ended 01/14/9527 for Joe. Given patient's income for a household of two, he still qualifies for medication assistance and [name] should allow him to re-enroll since he was approved last year. Of note, patient reports that he still has 3 boxes of Joe 4.13 mg/mL remaining that he has been using to manage his diabetes, instead on 1.5 mg/mL. He reports only checking his fasting blood glucose which ranges 150-170 mg/dL.  Per chart review, patient is prescribed Joe and could be costly. Per discussion with pharmacy, he has not meet the OOP expense of $600 to qualify; his current OOP for 2023 is $102.30. Additionally, patient last filled medication 11/3/Green. Since patient has not been filling expensive medications this year, it is unclear how soon he would go in the Medicare Coverage Gap.     Objective: The ASCVD Risk score (Arnett DK, et al., 2019) failed to calculate for the following reasons:   The valid total cholesterol range is 130 to 320 mg/dL  Lab Results  Component Value Date   CREATININE 0.84 09/29/2021   CREATININE 0.70 11/16/Green   CREATININE 0.78 08/16/Green    Lab Results  Component Value Date   HGBA1C 7.9 (H) 09/29/2021    Lipid Panel     Component Value Date/Time   CHOL 109 09/29/2021 1030   TRIG 69 09/29/2021 1030   HDL 40 09/29/2021 1030   CHOLHDL 2.7 09/29/2021 1030   VLDL  17 03/02/2017 1115   LDLCALC 54 09/29/2021 1030    BP Readings from Last 3 Encounters:  11/30/21 136/60  09/29/21 137/77  07/27/21 130/75    No Known Allergies  Medications Reviewed Today     Reviewed by Joe Green (Licensed Practical Nurse) on 11/30/21 at 1514  Med List Status: <None>   Medication Order Taking? Sig Documenting Provider Last Dose Status Informant  0.9 %  sodium chloride infusion [id number]   Joe Artist, Green  Active   acetaminophen (TYLENOL) 500 MG tablet [id number] Yes Take 2 tablets (1,000 mg total) by mouth every 6 (six) hours. Joe Green Taking Active   aspirin EC 81 MG tablet [id number] Yes Take 1 tablet (81 mg total) by mouth daily. Joe Green Taking Active Self  blood glucose meter kit and supplies [id number] Yes Dispense based on patient and insurance preference. Use up to four times daily as directed. (FOR ICD-9 250.00, 250.01). Joe Green Taking Active Self  Cholecalciferol (VITAMIN D) 50 MCG (2000 UT) tablet [id number] Yes Take 1 tablet (2,000 Units total) by mouth daily.  Patient taking differently: Take 10,000 Units by mouth daily.   Joe Green Taking Active   Cinnamon 500 MG TABS [id number] Yes Take 1 tablet by mouth in the morning and at bedtime. Provider, Historical, Green Taking Active   doxazosin (CARDURA) 8 MG tablet [id number] Yes Take  1 tablet  at Bedtime  for BP & Prostate Joe Pinto, Green Taking Active   Dulaglutide (Joe)  1.5 MG/0.5ML SOPN [id number] Yes Inject 1.5 mg into the skin once a week. Joe Comber, Green Taking Active            Med Note [name]   Wed Dec 14, Green  9:17 AM) Still taking 0.60m every week  fexofenadine (ALLEGRA) 60 MG tablet [id number] Take 60 mg by mouth daily as needed for allergies or rhinitis. Provider, Historical, Green Taking Active Self  finasteride (PROSCAR) 5 MG tablet [id number] Take 1 tablet daily for prostate. Joe Green Taking  Active   fluticasone furoate-vilanterol (Joe ELLIPTA) 100-25 MCG/ACT AEPB [id number] INHALE 1 PUFF BY MOUTH ONCE DAILY RINSE  MOUTH  WITH  WATER  AFTER  EACH  USE Joe Green Taking Active   glimepiride (AMARYL) 4 MG tablet [id number] TAKE 1/2-1 TABLET BY MOUTH TWICE DAILY WITH MEALS FOR DIABETES CLiane Comber Green Taking Active   glucose blood test strip [id number] Test blood sugars once daily.Dx: E11.9 CVladimir Crofts Green Taking Active Self  metFORMIN (GLUCOPHAGE) 1000 MG tablet [id number] Take  1 tablet  2 x /day with Meals  for Diabetes  / Patient knows to take by mouth Joe Green Taking Active            Med Note (Joe Green  9:12 AM) Have not taken for 3 weeks  nitroGLYCERIN (NITROSTAT) 0.4 MG SL tablet [id number] Place 1 tablet (0.4 mg total) under the tongue every 5 (five) minutes as needed for chest pain.  Patient not taking: Reported on 11/30/2021   Joe Green Not Taking Active   ramipril (ALTACE) 5 MG capsule [id number] Take 1 capsule   Daily  For BP  & Diabetic Kidney Protection (Dx: e11.29) Joe Green Taking Active   rOPINIRole (REQUIP) 3 MG tablet [id number] Take one tablet three times a day for restless legs. Joe Green Taking Active   rosuvastatin (CRESTOR) 20 MG tablet [id number] Take 1 tab three nights a week for cholesterol. Joe Green Taking Active             Medication Assistance Findings:  Medication assistance needs identified: Joe and Joe  Patient Assistance Programs: Joe Green: Yes Out-of-pocket prescription expenditure Green:   Not Applicable Reviewed program requirements with patient.    Joe Green: Yes Out-of-pocket prescription expenditure Green:   No (requires ~$500 this year) Reviewed program requirements with patient.      Plan: I will route patient assistance letter to  Joe Beachtechnician, for Joe, who will coordinate patient assistance program application process for medications listed above.  [facility] will assist with obtaining all required documents from both patient and provider(s) and submit application(s) once completed.  Will reach out to PCP re: Mr. LCocuzzadose of Joe.   Thank you for allowing pharmacy to be a part of this patient's care.  AKristeen Miss PharmD Clinical Pharmacist TGlen RidgeCell: [phone]

## 2022-03-01 NOTE — Telephone Encounter (Signed)
patient called to request referral to sleep medicine for OSA evaluation. Per Dr. [name] refer to Sleep Medicine dx OSA. Patient unable to tolerate cpap in the past.

## 2022-03-07 ENCOUNTER — Other Ambulatory Visit: Payer: Self-pay | Admitting: Internal Medicine

## 2022-03-07 ENCOUNTER — Telehealth: Payer: Self-pay | Admitting: Pharmacy Technician

## 2022-03-07 DIAGNOSIS — Z596 Low income: Secondary | ICD-10-CM

## 2022-03-07 NOTE — Progress Notes (Signed)
[place] [facility])                                            [place] Team    03/07/2022  CAREEM YASUI [birth year] [id number]                                      Medication Assistance Referral  Referral From:  [facility] Rph Asajah Damita Dunnings  Medication/Company: [name] / [name] Patient application portion:  Mailed Provider application portion: Faxed  to Dr. [name] Provider address/fax verified via: Office website    Kaveon Blatz P. Manjit Bufano, [place]  [phone]

## 2022-03-09 ENCOUNTER — Other Ambulatory Visit: Payer: Self-pay | Admitting: Internal Medicine

## 2022-03-09 DIAGNOSIS — G2581 Restless legs syndrome: Secondary | ICD-10-CM

## 2022-03-13 ENCOUNTER — Other Ambulatory Visit: Payer: Self-pay | Admitting: Internal Medicine

## 2022-03-13 DIAGNOSIS — N401 Enlarged prostate with lower urinary tract symptoms: Secondary | ICD-10-CM

## 2022-03-21 NOTE — Progress Notes (Signed)
MEDICARE ANNUAL WELLNESS  Assessment and Plan:   Annual Medicare Wellness Visit Due annually  Health maintenance reviewed Check with insurance about shingrix  Aortic atherosclerosis ([place]) - CT 11/2016 Control blood pressure, cholesterol, glucose, increase exercise.   Essential hypertension Continue medications Monitor blood pressure at home; call if consistently over 130/80 Continue DASH diet.   Reminder to go to the ER if any CP, SOB, nausea, dizziness, severe HA, changes vision/speech, left arm numbness and tingling and jaw pain.  Coronary artery disease involving native heart  Control blood pressure, cholesterol, glucose, increase exercise.  Denies angina; normal myoview 07/2019 per Dr. Johnsie Cancel Continue cardio follow up Stop smoking  OSA and COPD overlap syndrome ([place]) Wears mouthpiece, followed by Dr. Toy Cookey Will retry mask due to lack of perceived benefit with oral device; states he will reach out to DME  ILD (HCC)/Pulmonary fibrosis He plans to quit smoking Continue to follow with Dr. 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 Education: Reviewed 'ABCs' of diabetes management (respective goals in parentheses):  A1C (<7), blood pressure (<130/80), and cholesterol (LDL <70) Eye Exam yearly and Dental Exam every 6 months - schedule dental Currently using Metformin 54m daily, Glimepiride 4 mg 1/2-1 tab BID with meals and [name] 05.88 trying to get reenrolled in assistance program so can continue [name] 1.5 mg Dietary recommendations Physical Activity recommendations  Benign prostatic hyperplasia with urinary frequency/Elevated PSA Doxazosin and finasteride Followed by urology   Vitamin D deficiency Continue  supplementation Check vitamin D level annually and PRN; defer today   Smoking Has been using Chantix and is down to 1/2 ppd, continues to try to quit Will follow up at the next visit Strategies for success discussed.  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  Check lipid panel.   Medication management CBC, CMP/GFR, magnesium  Right lumbar pain with sciatica Referred to orthopedics  LLQ Abdominal pain Has history of diverticula on colonoscopy Diet to avoid seeds and nuts Monitor and if worsens refer to GI  Overweight (BMI 25.0-29.9) Long discussion about weight loss, diet, and exercise Recommended diet heavy in fruits and veggies and low in animal meats, cheeses, and dairy products, appropriate calorie intake Discussed appropriate weight for height  Follow up at next visit  B12 deficiency  Recheck levels; now on supplement      Discussed med's effects and SE's. Screening labs and tests as requested with regular follow-up as recommended. Over 40 minutes of exam, counseling, chart review and critical decision making was performed  Future Appointments  Date Time Provider DHolly 04/11/2022  9:00 AM Dohmeier, CAsencion Partridge MD GNA-GNA None  04/14/2022 10:00 AM GI-315 CT 1 GI-315CT GI-[facility] [address] [facility]  05/23/2022  3:00 PM BCarleene Mains RSheldonGAAM-GAAIM None  06/29/2022 10:00 AM WAlycia Rossetti NP GA[facility]-GAA[facility] None  11/30/2022 10:00 AM WAlycia Rossetti NP GA[facility]-GAA[facility] None    Plan:   During the course of the visit the patient was educated and counseled about appropriate screening and preventive services including:  Pneumococcal vaccine  Prevnar 13 Influenza vaccine Td vaccine Screening  electrocardiogram Bone densitometry screening Colorectal cancer screening Diabetes screening Glaucoma screening Nutrition counseling  Advanced directives: requested   HPI 71 y.o. male patient presents for AWV and follow up.   He reports LLQ abdominal pain which is intermittent, has diverticula noted on latest colonoscopy.  He states will get pain mostly when he bends over. Nothing seems to make it better, other than standing.  Follows with Dr. Erlinda Hong for ortho concerns - had shoulder injection and doing well, knees do bother him some, takes voltaren PRN. He is now having right lumbar pain with shooting pain down his right leg which has been occurring for several weeks. Nothing seems to make better, sitting for long periods makes it worse.    He is a smoker, cutting down, but admits smoking average 1/2 pack; estimated 50 pack year history; continues to use Chantix  He had Ct lung in 02/18/2021 which did not show any concerning nodules but suggestive of ILD, Repeat 05/04/21 Followed by pulmonology Dr. Vaughan Browner for interstitial lung disease, pulmonary fibrosis   OSA, intolerant of CPAP and had been using mouth piece but he feels didn't work well; managed by Dr. Toy Cookey, has been recommended repeat sleep study but patient adamantly declines repeat. He plans to try again with mask, still has CPAP.     He has RLS, gabapentin didn't help, now on requip taking 6 mg at night, runs out around midafternoon.   BMI is Body mass index is 26.89 kg/m., he has been working on diet and admits less active since retiring, but "not sitting." staying busy around the home, rebuiding deck, has stationary cycle to ride if no projects.  Admits to 2-4 beers nightly, 1 cup coffee daily  Generally watching diet  Wt Readings from Last 3 Encounters:  03/29/22 187 lb 6.4 oz (85 kg)  11/30/21 197 lb 12.8 oz (89.7 kg)  09/29/21 199 lb 6.4 oz (90.4 kg)   He has a history of CAD s/p PCI to RCA in 2005. He underwent LHC in 2006  and 2012 which showed a proximal LAD 60% (unchanged from previous catheterization), Medical therapy recommended. Has done well since cath in 2012 and followed by Dr. Kyla Balzarine office, had normal follow up myoview in 07/2019.   His blood pressure has been controlled at home, today their BP is BP: 116/62 He does not workout. He denies chest pain, shortness of breath, dizziness.   He is on cholesterol medication (rosuvastastin 20 mg three days a week) and denies myalgias. His cholesterol is at goal. The cholesterol last visit was:   Lab Results  Component Value Date   CHOL 109 09/29/2021   HDL 40 09/29/2021   LDLCALC 54 09/29/2021   TRIG 69 09/29/2021   CHOLHDL 2.7 09/29/2021   He has been working on diet and exercise for diabetes , he is on bASA, he is on ACE/ARB and denies foot ulcerations, increased appetite, nausea, paresthesia of the feet, polydipsia, polyuria, visual disturbances, vomiting and weight loss.  ABI - WNL 05/18/2020 He does check fasting sugars ranging 150-180, admits was initially 120s, reduced metformin  1 tab daily.  Was using [name] on assistance program but the program was discontinued so has only been taking the left over 0.75 mg dose he had.  Last A1C in the office was:  Lab Results  Component Value Date   HGBA1C 7.9 (H) 09/29/2021   Last GFR: Lab Results  Component Value Date  GFRNONAA 97 10/28/2020    Patient is on Vitamin D supplement, 10000 IU daily    Lab Results  Component Value Date   VD25OH 28 09/29/2021     Has hx of elevated PSAs, BPH with LUTS (nocturia x 2 night) and is followed by [facility]. He is on doxazosin and finasteride.  Lab Results  Component Value Date   PSA 2.07 06/29/2021   PSA 2.4 04/23/2020   PSA 3.3 03/10/2019   B12 was low, reports did start on B complex though not regularly  Lab Results  Component Value Date   VITAMINB12 220 03/29/2021     Current Medications:  Current Outpatient Medications on File  Prior to Visit  Medication Sig Dispense Refill   acetaminophen (TYLENOL) 500 MG tablet Take 2 tablets (1,000 mg total) by mouth every 6 (six) hours. 30 tablet 0   aspirin EC 81 MG tablet Take 1 tablet (81 mg total) by mouth daily. 90 tablet 3   Cholecalciferol (VITAMIN D) 50 MCG (2000 UT) tablet Take 1 tablet (2,000 Units total) by mouth daily. (Patient taking differently: Take 10,000 Units by mouth daily.)     Cinnamon 500 MG TABS Take 1 tablet by mouth in the morning and at bedtime.     doxazosin (CARDURA) 8 MG tablet Take  1 tablet  at Bedtime  for BP & Prostate 90 tablet 3   Dulaglutide ([name]) 1.5 HQ/7.5FF SOPN Inject 1.5 mg into the skin once a week. 8 mL 3   fexofenadine (ALLEGRA) 60 MG tablet Take 60 mg by mouth daily as needed for allergies or rhinitis.     finasteride (PROSCAR) 5 MG tablet TAKE 1 TABLET BY MOUTH ONCE DAILY FOR PROSTATE 90 tablet 0   fluticasone furoate-vilanterol (BREO ELLIPTA) 100-25 MCG/ACT AEPB Use 1 inhalation Daily for COPD / Asthma 180 each 3   glimepiride (AMARYL) 4 MG tablet TAKE 1/2-1 TABLET BY MOUTH TWICE DAILY WITH MEALS FOR DIABETES 180 tablet 1   metFORMIN (GLUCOPHAGE) 1000 MG tablet Take  1 tablet  2 x /day with Meals  for Diabetes  / Patient knows to take by mouth 180 tablet 3   Nicotine 21-14-7 MG/24HR KIT Place 1 patch onto the skin daily in the afternoon. 1 kit 0   ramipril (ALTACE) 5 MG capsule TAKE 1 CAPSULE BY MOUTH ONCE DAILY FOR  BLOOD  PRESSURE  AND  DIABETIC  KIDNEY  PROTECTION 90 capsule 0   rOPINIRole (REQUIP) 3 MG tablet Take  1 tablet 3 x /day as needed for Restless legs                              /                                                 TAKE                           BY                     MOUTH 270 tablet 1   rosuvastatin (CRESTOR) 20 MG tablet Take 1 tab three nights a week for cholesterol. 90 tablet 1   blood glucose meter kit and supplies Dispense based on patient and  insurance preference. Use up to four times daily as  directed. (FOR ICD-9 250.00, 250.01). 1 each 0   glucose blood test strip Test blood sugars once daily.Dx: E11.9 100 each 2   Current Facility-Administered Medications on File Prior to Visit  Medication Dose Route Frequency Provider Last Rate Last Admin   0.9 %  sodium chloride infusion  500 mL Intravenous Continuous Ladene Artist, MD       Allergies:  No Known Allergies Health Maintenance:  Immunization History  Administered Date(s) Administered   Influenza, High Dose Seasonal PF 06/22/2017, 08/03/2018, 05/15/2019, 07/28/2020, 06/29/2021   Influenza-Unspecified 06/02/2013, 08/18/2014   PFIZER(Purple Top)SARS-COV-2 Vaccination 11/19/2019, 12/10/2019   Pneumococcal Conjugate-13 11/21/2016   Pneumococcal Polysaccharide-23 08/27/2007, 01/01/2018   Td 01/01/2018   Tdap 08/27/2007   Zoster, Live 10/20/2013   Tetanus: 2019 Pneumovax: 2009, 2019 Prevnar 13: 2018 Flu vaccine: 05/2020 Zostavax: 2015, check with insurance about shingrix, ? Had breakthrough case in May 2022 Covid 19: 2/2, 2021, pfizer- + booster, requested record  DEXA: n/a Colonoscopy: 04/19/21 2 polyps and diverticula noted   CT lung:  02/2021 - aortic atherosclerosis, emphysema , ILD - was referred to pulm, follow up 04/2021  Stress test: 07/2019  Names of Other Physician/Practitioners you currently use: 1. [facility] here for primary care 2. Dr. Katy Fitch, eye doctor, last visit 09/07/2020 without retinopathy 3. Dr. ?, dentist, last visit remote, had scheduled but needs to find new dentist due to insurance 4. Dr. ?, derm, last few years ago, plans to schedule follow up this year   Patient Care Team: [name], MD as PCP - General (Internal Medicine) Josue Hector, MD as PCP - Cardiology (Cardiology) Josue Hector, MD as Consulting Physician (Cardiology) Ladene Artist, MD as Consulting Physician (Gastroenterology) [name], [facility] as Pharmacist  (Pharmacist)  Medical History:  has Hyperlipidemia associated with type 2 diabetes mellitus [facility]); CAD (coronary artery disease); Hypertension; OSA and COPD overlap syndrome ([place]); Smoking; Vitamin D deficiency; Medication management; Type 2 diabetes mellitus (HCC); RLS (restless legs syndrome); COPD (chronic obstructive pulmonary disease) (HCC); BPH (benign prostatic hyperplasia); Elevated PSA; Chondromalacia, left knee; Chondromalacia, right knee; Overweight (BMI 25.0-29.9); Aortic atherosclerosis (HCC)by Chest CT scan 11/2016; Hepatic steatosis; B12 deficiency; History of colonic polyps; Excessive drinking alcohol; ILD (interstitial lung disease) ([place]); and Gallstones on their problem list. Surgical History:  He  has a past surgical history that includes heart stent (2005); Colonoscopy (11/2011); Polypectomy; Cardiac catheterization (2005); Inguinal hernia repair (Right, 11/16/2014); Insertion of mesh (Right, 11/16/2014); Knee arthroscopy with medial menisectomy (Bilateral, 06/25/2017); and Chondroplasty (06/25/2017). Family History:  His family history includes Cancer in his mother; Hypertension in his father; Kidney cancer in his maternal grandmother; Prostate cancer in his father. Social History:   reports that he has been smoking cigarettes. He started smoking about 53 years ago. He has a 25.50 pack-year smoking history. He has never used smokeless tobacco. He reports current alcohol use of about 14.0 standard drinks of alcohol per week. He reports that he does not use drugs.  MEDICARE WELLNESS OBJECTIVES: Physical activity: Current Exercise Habits: The patient does not participate in regular exercise at present, Exercise limited by: respiratory conditions(s) Cardiac risk factors: Cardiac Risk Factors include: advanced age (>32mn, >>8women);dyslipidemia;diabetes mellitus;hypertension;male gender;sedentary lifestyle;smoking/ tobacco exposure Depression/mood screen:      03/29/2022   10:55 AM   Depression screen PHQ 2/9  Decreased Interest 0  Down, Depressed, Hopeless 0  PHQ - 2 Score 0  ADLs:     03/29/2022   10:55 AM 09/29/2021   12:01 AM  In your present state of health, do you have any difficulty performing the following activities:  Hearing? 0 0  Vision? 0 0  Difficulty concentrating or making decisions? 0 0  Walking or climbing stairs? 0 0  Dressing or bathing? 0 0  Doing errands, shopping? 0 0     Cognitive Testing  Alert? Yes  Normal Appearance?Yes  Oriented to person? Yes  Place? Yes   Time? Yes  Recall of three objects?  Yes  Can perform simple calculations? Yes  Displays appropriate judgment?Yes  Can read the correct time from a watch face?Yes  EOL planning: Does Patient Have a Medical Advance Directive?: Yes Type of Advance Directive: Living will, Healthcare Power of [place] in Chart?: No - copy requested     Review of Systems:  Review of Systems  Constitutional:  Negative for malaise/fatigue and weight loss.  HENT:  Negative for hearing loss and tinnitus.   Eyes:  Negative for blurred vision and double vision.  Respiratory:  Negative for cough, sputum production, shortness of breath and wheezing.   Cardiovascular:  Negative for chest pain, palpitations, orthopnea, claudication, leg swelling and PND.  Gastrointestinal:  Positive for abdominal pain (LLQ). Negative for blood in stool, constipation, diarrhea, heartburn, melena, nausea and vomiting.  Genitourinary: Negative.        Nocturia x 2  Musculoskeletal:  Positive for back pain (lumbar, R, sciatic down right leg). Negative for falls, joint pain and myalgias.  Skin:  Negative for rash.  Neurological:  Negative for dizziness, tingling, sensory change, weakness and headaches.  Endo/Heme/Allergies:  Positive for environmental allergies (mild, declines tx). Negative for polydipsia.  Psychiatric/Behavioral:  Negative for depression, memory loss, substance  abuse and suicidal ideas. The patient is not nervous/anxious and does not have insomnia (improved with requip).   All other systems reviewed and are negative.   Physical Exam: Estimated body mass index is 26.89 kg/m as calculated from the following:   Height as of this encounter: 5' 10"  (1.778 m).   Weight as of this encounter: 187 lb 6.4 oz (85 kg). BP 116/62   Pulse 80   Temp 97.7 F (36.5 C)   Ht 5' 10"  (1.778 m)   Wt 187 lb 6.4 oz (85 kg)   SpO2 97%   BMI 26.89 kg/m  General Appearance: Well nourished, in no apparent distress.  Eyes: PERRLA, EOMs, conjunctiva no swelling or erythema Sinuses: No Frontal/maxillary tenderness  ENT/Mouth: Ext aud canals clear, normal light reflex with TMs without erythema, bulging. Good dentition. No erythema, swelling, or exudate on post pharynx. Tonsils not swollen or erythematous. Hearing normal.  Neck: Supple, thyroid normal. No bruits  Respiratory: Respiratory effort normal, BS equal bilaterally without rales, rhonchi, wheezing or stridor.  Cardio: RRR without murmurs, rubs or gallops. Intact brisk pedal pulses, post tibial pulses are faint,  without edema.  Chest: symmetric, with normal excursions and percussion.  Abdomen: Soft, nontender, no guarding, rebound, masses, or organomegaly. Mild ventral hernia with bearing down.  Lymphatics: Non tender without lymphadenopathy.  Genitourinary: No concerns, declines; urology following Musculoskeletal: Full ROM all peripheral extremities,5/5 strength, and normal gait.  Skin: Warm, dry without rashes, lesions, ecchymosis. Neuro: Cranial nerves intact, reflexes equal bilaterally. Normal muscle tone, no cerebellar symptoms. Sensation intact with monofilament bilaterally Psych: Awake and oriented X 3, normal affect, Insight and Judgment appropriate.    Medicare Attestation  I have personally reviewed: The patient's medical and social history Their use of alcohol, tobacco or illicit drugs Their current  medications and supplements The patient's functional ability including ADLs,fall risks, home safety risks, cognitive, and hearing and visual impairment Diet and physical activities Evidence for depression or mood disorders  The patient's weight, height, BMI, and visual acuity have been recorded in the chart.  I have made referrals, counseling, and provided education to the patient based on review of the above and I have provided the patient with a written personalized care plan for preventive services.     Markevious Ehmke E  11:18 AM [facility]

## 2022-03-29 ENCOUNTER — Encounter: Payer: Self-pay | Admitting: Nurse Practitioner

## 2022-03-29 ENCOUNTER — Ambulatory Visit (INDEPENDENT_AMBULATORY_CARE_PROVIDER_SITE_OTHER): Payer: Medicare Other | Admitting: Nurse Practitioner

## 2022-03-29 ENCOUNTER — Ambulatory Visit: Payer: Medicare Other | Admitting: Adult Health

## 2022-03-29 VITALS — BP 116/62 | HR 80 | Temp 97.7°F | Ht 70.0 in | Wt 187.4 lb

## 2022-03-29 DIAGNOSIS — I10 Essential (primary) hypertension: Secondary | ICD-10-CM

## 2022-03-29 DIAGNOSIS — E785 Hyperlipidemia, unspecified: Secondary | ICD-10-CM

## 2022-03-29 DIAGNOSIS — G4733 Obstructive sleep apnea (adult) (pediatric): Secondary | ICD-10-CM | POA: Diagnosis not present

## 2022-03-29 DIAGNOSIS — Z0001 Encounter for general adult medical examination with abnormal findings: Secondary | ICD-10-CM | POA: Diagnosis not present

## 2022-03-29 DIAGNOSIS — J849 Interstitial pulmonary disease, unspecified: Secondary | ICD-10-CM | POA: Diagnosis not present

## 2022-03-29 DIAGNOSIS — E1122 Type 2 diabetes mellitus with diabetic chronic kidney disease: Secondary | ICD-10-CM

## 2022-03-29 DIAGNOSIS — R6889 Other general symptoms and signs: Secondary | ICD-10-CM

## 2022-03-29 DIAGNOSIS — N401 Enlarged prostate with lower urinary tract symptoms: Secondary | ICD-10-CM

## 2022-03-29 DIAGNOSIS — E1169 Type 2 diabetes mellitus with other specified complication: Secondary | ICD-10-CM

## 2022-03-29 DIAGNOSIS — Z720 Tobacco use: Secondary | ICD-10-CM

## 2022-03-29 DIAGNOSIS — J449 Chronic obstructive pulmonary disease, unspecified: Secondary | ICD-10-CM

## 2022-03-29 DIAGNOSIS — M5386 Other specified dorsopathies, lumbar region: Secondary | ICD-10-CM

## 2022-03-29 DIAGNOSIS — E1159 Type 2 diabetes mellitus with other circulatory complications: Secondary | ICD-10-CM | POA: Diagnosis not present

## 2022-03-29 DIAGNOSIS — Z0000 Encounter for general adult medical examination without abnormal findings: Secondary | ICD-10-CM

## 2022-03-29 DIAGNOSIS — I700 Atherosclerosis of aorta: Secondary | ICD-10-CM

## 2022-03-29 DIAGNOSIS — N182 Chronic kidney disease, stage 2 (mild): Secondary | ICD-10-CM

## 2022-03-29 DIAGNOSIS — E663 Overweight: Secondary | ICD-10-CM

## 2022-03-29 DIAGNOSIS — R350 Frequency of micturition: Secondary | ICD-10-CM

## 2022-03-29 DIAGNOSIS — E559 Vitamin D deficiency, unspecified: Secondary | ICD-10-CM

## 2022-03-29 DIAGNOSIS — Z79899 Other long term (current) drug therapy: Secondary | ICD-10-CM

## 2022-03-29 DIAGNOSIS — G2581 Restless legs syndrome: Secondary | ICD-10-CM

## 2022-03-29 DIAGNOSIS — R972 Elevated prostate specific antigen [PSA]: Secondary | ICD-10-CM

## 2022-03-29 DIAGNOSIS — I2510 Atherosclerotic heart disease of native coronary artery without angina pectoris: Secondary | ICD-10-CM

## 2022-03-29 DIAGNOSIS — E538 Deficiency of other specified B group vitamins: Secondary | ICD-10-CM

## 2022-03-29 DIAGNOSIS — J8410 Pulmonary fibrosis, unspecified: Secondary | ICD-10-CM

## 2022-03-29 MED ORDER — NITROGLYCERIN 0.4 MG SL SUBL: 0.4000 mg | SUBLINGUAL_TABLET | SUBLINGUAL | 3 refills | Status: DC | PRN

## 2022-03-29 MED ORDER — VARENICLINE TARTRATE 1 MG PO TABS: 1.0000 mg | ORAL_TABLET | Freq: Two times a day (BID) | ORAL | 3 refills | Status: DC

## 2022-03-30 ENCOUNTER — Other Ambulatory Visit: Payer: Self-pay | Admitting: Nurse Practitioner

## 2022-03-30 DIAGNOSIS — E1122 Type 2 diabetes mellitus with diabetic chronic kidney disease: Secondary | ICD-10-CM

## 2022-03-30 LAB — LIPID PANEL
Cholesterol: 111 mg/dL (ref <200)
HDL: 44 mg/dL (ref >=40)
LDL Cholesterol (Calc): 50 mg/dL (calc)
Non-HDL Cholesterol (Calc): 67 mg/dL (calc) (ref <130)
Total CHOL/HDL Ratio: 2.5 (calc) (ref <5.0)
Triglycerides: 89 mg/dL (ref <150)

## 2022-03-30 LAB — CBC WITH DIFFERENTIAL/PLATELET
Absolute Monocytes: 593 cells/uL (ref 200–950)
Basophils Absolute: 47 cells/uL (ref 0–200)
Basophils Relative: 0.6 %
Eosinophils Absolute: 257 cells/uL (ref 15–500)
Eosinophils Relative: 3.3 %
HCT: 46.1 % (ref 38.5–50.0)
Hemoglobin: 15.8 g/dL (ref 13.2–17.1)
Lymphs Abs: 1888 cells/uL (ref 850–3900)
MCH: 31.7 pg (ref 27.0–33.0)
MCHC: 34.3 g/dL (ref 32.0–36.0)
MCV: 92.4 fL (ref 80.0–100.0)
MPV: 12.1 fL (ref 7.5–12.5)
Monocytes Relative: 7.6 %
Neutro Abs: 5015 cells/uL (ref 1500–7800)
Neutrophils Relative %: 64.3 %
Platelets: 166 10*3/uL (ref 140–400)
RBC: 4.99 10*6/uL (ref 4.20–5.80)
RDW: 11.9 % (ref 11.0–15.0)
Total Lymphocyte: 24.2 %
WBC: 7.8 10*3/uL (ref 3.8–10.8)

## 2022-03-30 LAB — COMPLETE METABOLIC PANEL WITH GFR
AG Ratio: 1.6 (calc) (ref 1.0–2.5)
ALT: 16 U/L (ref 9–46)
AST: 13 U/L (ref 10–35)
Albumin: 4.2 g/dL (ref 3.6–5.1)
Alkaline phosphatase (APISO): 113 U/L (ref 35–144)
BUN: 10 mg/dL (ref 7–25)
CO2: 25 mmol/L (ref 20–32)
Calcium: 9.3 mg/dL (ref 8.6–10.3)
Chloride: 101 mmol/L (ref 98–110)
Creat: 0.81 mg/dL (ref 0.70–1.28)
Globulin: 2.7 g/dL (calc) (ref 1.9–3.7)
Glucose, Bld: 235 mg/dL — ABNORMAL HIGH (ref 65–99)
Potassium: 4.4 mmol/L (ref 3.5–5.3)
Sodium: 136 mmol/L (ref 135–146)
Total Bilirubin: 0.4 mg/dL (ref 0.2–1.2)
Total Protein: 6.9 g/dL (ref 6.1–8.1)
eGFR: 95 mL/min/{1.73_m2} (ref >=60)

## 2022-03-30 LAB — HEMOGLOBIN A1C
Hgb A1c MFr Bld: 8.8 % of total Hgb — ABNORMAL HIGH (ref <5.7)
Mean Plasma Glucose: 206 mg/dL
eAG (mmol/L): 11.4 mmol/L

## 2022-03-30 LAB — TSH: TSH: 1.87 mIU/L (ref 0.40–4.50)

## 2022-03-30 LAB — VITAMIN B12: Vitamin B-12: 217 pg/mL (ref 200–1100)

## 2022-03-30 MED ORDER — TRULICITY 3 MG/0.5ML ~~LOC~~ SOAJ: 3.0000 mg | SUBCUTANEOUS | 3 refills | Status: DC

## 2022-04-04 ENCOUNTER — Telehealth: Payer: Self-pay | Admitting: Pharmacy Technician

## 2022-04-04 DIAGNOSIS — Z596 Low income: Secondary | ICD-10-CM

## 2022-04-04 NOTE — Progress Notes (Signed)
[facility])                                            [place] Team    04/04/2022  Joe Green 03-12-1951 [id number]  Received both patient and provider portion(s) of patient assistance application(s) for [name]. Faxed completed application and required documents into [name].   Vermelle Cammarata P. Dalisa Forrer, [place]  [phone]

## 2022-04-07 ENCOUNTER — Telehealth: Payer: Self-pay | Admitting: Pharmacy Technician

## 2022-04-07 DIAGNOSIS — Z596 Low income: Secondary | ICD-10-CM

## 2022-04-07 NOTE — Progress Notes (Addendum)
[place] [facility])                                            [place] Team    04/07/2022  Joe Green [birth year] [id number]  Care coordination call placed to [place] in regard to [name] application.  Spoke to [place] who informs patient is APPROVED 04/04/22-08/13/22. He informs order was placed and then transferred me to their pharmacy, [facility], for an update. Spoke to [employer] [facility] [place] who informs his order in the fulfillment stage and should be delivered to patient's home address next week.  Cathrine Krizan P. Danica Camarena, [place]  [phone]

## 2022-04-10 DIAGNOSIS — M5416 Radiculopathy, lumbar region: Secondary | ICD-10-CM | POA: Diagnosis not present

## 2022-04-10 DIAGNOSIS — Z0189 Encounter for other specified special examinations: Secondary | ICD-10-CM | POA: Diagnosis not present

## 2022-04-11 ENCOUNTER — Encounter: Payer: Self-pay | Admitting: Neurology

## 2022-04-11 ENCOUNTER — Ambulatory Visit (INDEPENDENT_AMBULATORY_CARE_PROVIDER_SITE_OTHER): Payer: Medicare Other | Admitting: Neurology

## 2022-04-11 VITALS — BP 119/64 | HR 73 | Ht 70.0 in | Wt 188.0 lb

## 2022-04-11 DIAGNOSIS — G4733 Obstructive sleep apnea (adult) (pediatric): Secondary | ICD-10-CM | POA: Diagnosis not present

## 2022-04-11 DIAGNOSIS — I2510 Atherosclerotic heart disease of native coronary artery without angina pectoris: Secondary | ICD-10-CM

## 2022-04-11 DIAGNOSIS — F17200 Nicotine dependence, unspecified, uncomplicated: Secondary | ICD-10-CM | POA: Diagnosis not present

## 2022-04-11 DIAGNOSIS — G2581 Restless legs syndrome: Secondary | ICD-10-CM

## 2022-04-11 DIAGNOSIS — J449 Chronic obstructive pulmonary disease, unspecified: Secondary | ICD-10-CM

## 2022-04-11 NOTE — Patient Instructions (Signed)

## 2022-04-11 NOTE — Progress Notes (Signed)
SLEEP MEDICINE CLINIC    Provider:  Larey Seat, MD  Primary Care Physician:  [name], MD [address] [place] [address]     Referring Provider: [name], [place],  [place] [address]          Chief Complaint according to patient   Patient presents with:     New Patient (Initial Visit)           HISTORY OF PRESENT ILLNESS:  Joe Green is a 71 y.o. Caucasian male patient seen here as a referral on 04/11/2022 from PCP  for a .  Chief concern according to patient :  Last PSG in lab 2014 , was started on CPAP. He used the machine but wasn't as compliant. He has not been using the CPAP but has OSA and COPD , DM2, had 2005 dx CAD with stents. I liked CPAP but it kept me from sleeping on my side- sleep study was done at [facility]-  he doesn't recall any follow up. He is interested in inspire.    I have the pleasure of seeing Joe Green today, a right -handed Caucasian male with a history of OSA and COPD, sleep disorder.  He  has a past medical history of CAD (coronary artery disease), GERD (gastroesophageal reflux disease), HLD (hyperlipidemia), Hypertension, Obesity, OSA (obstructive sleep apnea), Type II or unspecified type diabetes mellitus without mention of complication, not stated as uncontrolled, Vitamin D deficiency, and Wears glasses.   Sleep relevant medical history: Nocturia 3-4 times, no Tonsillectomy, deviated septum, sinus congestion, allergic rhinitis/ hay-fever     Family medical /sleep history: no other family member on CPAP with OSA, insomnia, sleep walkers.    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 Family status is , with 3 adult children.   Tobacco use; ongoing - on chantix .  smokes since age 40. ETOH use: beer 2 after meals.  Caffeine intake in form of Coffee( 1 cup  a day) Soda( /) Tea ( /) or energy drinks. Regular exercise in form of none.       Sleep habits are as follows: The patient's dinner time is between 5-7 PM. The patient goes to bed at 10 PM and takes RLS medication- continues to sleep for 5-6 hours total , wakes for 3 bathroom breaks, the first time at 1 AM.  Bedroom is cool, quiet and dark.  The preferred sleep position is laterally, or in a recliner , or with the support of 2-3 pillows. Needs a wedge .  Dreams are reportedly infrequent/vivid. He attributes this to RLS.  6.30  AM is the usual rise time. The patient wakes up spontaneously at about 6 AM.  He reports not feeling refreshed or restored in AM, with symptoms such as dry mouth, morning headaches, and residual fatigue.  Naps are taken rarely -, lasting from 10 to 15 minutes and are refreshing - these do not interfere with nocturnal sleep.    Review of Systems: Out of a complete 14 system review, the patient complains of only the following symptoms, and all other reviewed systems are negative.:  Fatigue, sleepiness , snoring, fragmented sleep, nocturia.  RLS  How likely are you to doze in the following situations: 0 = not likely, 1 = slight chance, 2 = moderate chance, 3 =  high chance   Sitting and Reading? Watching Television? Sitting inactive in a public place (theater or meeting)? As a passenger in a car for an hour without a break? Lying down in the afternoon when circumstances permit? Sitting and talking to someone? Sitting quietly after lunch without alcohol? In a car, while stopped for a few minutes in traffic?   Total = 12/ 24 points   FSS endorsed at 33/ 63 points.   GDS : 3/ 15   Social History   Socioeconomic History   Marital status: Married    Spouse name: Not on file   Number of children: 3   Years of education: Not on file   Highest education level: Not on file  Occupational History   Not on file  Tobacco Use   Smoking status: Every Day    Packs/day:  0.50    Years: 51.00    Total pack years: 25.50    Types: Cigarettes    Start date: 89   Smokeless tobacco: Never  Vaping Use   Vaping Use: Never used  Substance and Sexual Activity   Alcohol use: Yes    Alcohol/week: 14.0 standard drinks of alcohol    Types: 14 Cans of beer per week   Drug use: No   Sexual activity: Yes    Partners: Female    Birth control/protection: Post-menopausal  Other Topics Concern   Not on file  Social History Narrative   Not on file   Social Determinants of Health   Financial Resource Strain: Not on file  Food Insecurity: No Food Insecurity (08/12/2021)   Hunger Vital Sign    Worried About Running Out of Food in the Last Year: Never true    Ran Out of Food in the Last Year: Never true  Transportation Needs: No Transportation Needs (08/12/2021)   PRAPARE - Hydrologist (Medical): No    Lack of Transportation (Non-Medical): No  Physical Activity: Sufficiently Active (01/01/2018)   Exercise Vital Sign    Days of Exercise per Week: 5 days    Minutes of Exercise per Session: 60 min  Stress: No Stress Concern Present (03/10/2019)   [place]    Feeling of Stress : Only a little  Social Connections: Not on file    Family History  Problem Relation Age of Onset   Cancer Mother        vaginal?    Prostate cancer Father    Hypertension Father    Kidney cancer Maternal Grandmother    Colon cancer Neg Hx    Colon polyps Neg Hx    Esophageal cancer Neg Hx    Rectal cancer Neg Hx    Stomach cancer Neg Hx     Past Medical History:  Diagnosis Date   CAD (coronary artery disease)    a. s/p stent to RCA 2005; b. LHC 2006 after Nuc with inf ischemia: patent RCA stent, D1 80%, LAD 60% => med Rx;  c. LHC 04/18/11: Proximal LAD 60% (unchanged from previous catheterization), distal LAD 65% (small caliber), Dx 60%, proximal CFX 20%, mid CFX 50%, OM1 30%, proximal  RCA 20%, mid RCA stent patent, EF 65%.=> med Rx   GERD (gastroesophageal reflux disease)    HLD (hyperlipidemia)    Hypertension    Obesity    OSA (obstructive sleep apnea)    has a cpap-does not use-has a special mouthpiece he wears at night   Type  II or unspecified type diabetes mellitus without mention of complication, not stated as uncontrolled    Vitamin D deficiency    Wears glasses     Past Surgical History:  Procedure Laterality Date   CARDIAC CATHETERIZATION  2005   stent   CHONDROPLASTY  06/25/2017   Procedure: CHONDROPLASTY;  Surgeon: Dorna Leitz, MD;  Location: [facility];  Service: Orthopedics;;   COLONOSCOPY  11/2011   Due 5 years Dr. Fuller Plan   heart stent  2005   RCA   INGUINAL HERNIA REPAIR Right 11/16/2014   Procedure: RIGHT INGUINAL HERNIA REPAIR WITH MESH;  Surgeon: Armandina Gemma, MD;  Location: [facility];  Service: General;  Laterality: Right;   INSERTION OF MESH Right 11/16/2014   Procedure: INSERTION OF MESH;  Surgeon: Armandina Gemma, MD;  Location: [facility];  Service: General;  Laterality: Right;   KNEE ARTHROSCOPY WITH MEDIAL MENISECTOMY Bilateral 06/25/2017   Procedure: right knee arthroscopy with medial meniscectomy, chondroplasty, Left knee arthroscopy with medial menisectomy removal of plica and chondroplasty;  Surgeon: Dorna Leitz, MD;  Location: [facility];  Service: Orthopedics;  Laterality: Bilateral;   POLYPECTOMY       Current Outpatient Medications on File Prior to Visit  Medication Sig Dispense Refill   acetaminophen (TYLENOL) 500 MG tablet Take 2 tablets (1,000 mg total) by mouth every 6 (six) hours. 30 tablet 0   aspirin EC 81 MG tablet Take 1 tablet (81 mg total) by mouth daily. 90 tablet 3   blood glucose meter kit and supplies Dispense based on patient and insurance preference. Use up to four times daily as directed. (FOR ICD-9 250.00, 250.01). 1 each 0   Cholecalciferol (VITAMIN D) 50  MCG (2000 UT) tablet Take 1 tablet (2,000 Units total) by mouth daily. (Patient taking differently: Take 10,000 Units by mouth daily.)     Cinnamon 500 MG TABS Take 1 tablet by mouth in the morning and at bedtime.     doxazosin (CARDURA) 8 MG tablet Take  1 tablet  at Bedtime  for BP & Prostate 90 tablet 3   Dulaglutide ([name]) 3 FK/8.1EX SOPN Inject 3 mg as directed once a week. 2 mL 3   fexofenadine (ALLEGRA) 60 MG tablet Take 60 mg by mouth daily as needed for allergies or rhinitis.     finasteride (PROSCAR) 5 MG tablet TAKE 1 TABLET BY MOUTH ONCE DAILY FOR PROSTATE 90 tablet 0   fluticasone furoate-vilanterol (BREO ELLIPTA) 100-25 MCG/ACT AEPB Use 1 inhalation Daily for COPD / Asthma 180 each 3   glimepiride (AMARYL) 4 MG tablet TAKE 1/2-1 TABLET BY MOUTH TWICE DAILY WITH MEALS FOR DIABETES 180 tablet 1   glucose blood test strip Test blood sugars once daily.Dx: E11.9 100 each 2   metFORMIN (GLUCOPHAGE) 1000 MG tablet Take  1 tablet  2 x /day with Meals  for Diabetes  / Patient knows to take by mouth 180 tablet 3   Nicotine 21-14-7 MG/24HR KIT Place 1 patch onto the skin daily in the afternoon. 1 kit 0   nitroGLYCERIN (NITROSTAT) 0.4 MG SL tablet Place 1 tablet (0.4 mg total) under the tongue every 5 (five) minutes as needed for chest pain. 25 tablet 3   ramipril (ALTACE) 5 MG capsule TAKE 1 CAPSULE BY MOUTH ONCE DAILY FOR  BLOOD  PRESSURE  AND  DIABETIC  KIDNEY  PROTECTION 90 capsule 0   rOPINIRole (REQUIP) 3 MG tablet Take  1 tablet 3 x /  day as needed for Restless legs                              /                                                 TAKE                           BY                     MOUTH 270 tablet 1   rosuvastatin (CRESTOR) 20 MG tablet Take 1 tab three nights a week for cholesterol. 90 tablet 1   varenicline (CHANTIX CONTINUING MONTH PAK) 1 MG tablet Take 1 tablet (1 mg total) by mouth 2 (two) times daily. 60 tablet 3   meloxicam (MOBIC) 15 MG tablet Take 15 mg by mouth  daily as needed.     methocarbamol (ROBAXIN) 500 MG tablet Take 1 tablet by mouth daily as needed.     Current Facility-Administered Medications on File Prior to Visit  Medication Dose Route Frequency Provider Last Rate Last Admin   0.9 %  sodium chloride infusion  500 mL Intravenous Continuous Ladene Artist, MD        No Known Allergies  Physical exam:  Today's Vitals   04/11/22 0855  BP: 119/64  Pulse: 73  Weight: 188 lb (85.3 kg)  Height: 5' 10"  (1.778 m)   Body mass index is 26.98 kg/m.   Wt Readings from Last 3 Encounters:  04/11/22 188 lb (85.3 kg)  03/29/22 187 lb 6.4 oz (85 kg)  11/30/21 197 lb 12.8 oz (89.7 kg)     Ht Readings from Last 3 Encounters:  04/11/22 5' 10"  (1.778 m)  03/29/22 5' 10"  (1.778 m)  11/30/21 5' 10"  (1.778 m)      General: The patient is awake, alert and appears not in acute distress. The patient is well groomed. Head: Normocephalic, atraumatic. Neck is supple.  Mallampati 3,  neck circumference:16 inches . Nasal airflow barely patent.  Retrognathia is noted.  Dental status: biological Cardiovascular:  Regular rate and cardiac rhythm by pulse,  without distended neck veins. Respiratory: Lungs are clear to auscultation.  Skin:  Without evidence of ankle edema, or rash. Trunk: The patient's posture is erect.   Neurologic exam : The patient is awake and alert, oriented to place and time.   Memory subjective described as intact.  Attention span & concentration ability appears normal.  Speech is fluent,  without  dysarthria, dysphonia or aphasia.  Mood and affect are appropriate.   Cranial nerves: no loss of smell or taste reported  Pupils are equal and briskly reactive to light. Funduscopic exam : deferred.  Extraocular movements in vertical and horizontal planes were intact and without nystagmus.  No Diplopia. Visual fields by finger perimetry are intact. Hearing was intact to soft voice and finger rubbing.    Facial sensation  intact to fine touch.  Facial motor strength is symmetric and tongue and uvula move midline.  Neck ROM : rotation, tilt and flexion extension were normal for age and shoulder shrug was symmetrical.    Motor exam:  Symmetric bulk, tone and ROM.   Normal tone without  cog-wheeling, symmetric grip strength .   Sensory:  Fine touch  and vibration were sensed at both ankles Proprioception tested in the upper extremities was normal.   Coordination: Rapid alternating movements in the fingers/hands were of normal speed.  The Finger-to-nose maneuver was intact without evidence of ataxia, dysmetria or tremor.   Gait and station: Patient could rise unassisted from a seated position, walked without assistive device.  Stance is of normal width/ base.  Toe and heel walk were deferred.  Deep tendon reflexes: in the upper and lower extremities are symmetric and intact.  Babinski response was deferred.       After spending a total time of 45 minutes face to face and additional time for physical and neurologic examination, review of laboratory studies,  personal review of imaging studies, reports and results of other testing and review of referral information / records as far as provided in visit, I have established the following assessments:  1) patient with RLS , unclear if PAD ,PVD or neuropathy- also there was no neuropathic loss of vibration.  2) history of longstanding tobacco abuse and COPD, CAD, and reportedly OSA- he has been sleeping in a recliner for months now, orthopnoea.  3) OSA is likely overshadowed by COPD.    My Plan is to proceed with:  1)I suspect SOB and low oxygen saturation will be found, and this will exclude this patient from [place]. Inspire only can remove the airway blockage by the tongue, but not enhance the deeper breathing and air exchange in the chest-  lung and diaphragmatic strength , chest wall lifting strength.  2)nasal congestion- he used a FFM- but this limited his  ability to sleep on the side, now in a recliner , this will not be an issue.  3) attended sleep study with [facility] at [facility] 15/ h and possible oxygen addition. 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   I would like to thank [name], MD and [name], [place],   [id number] for allowing me to meet with and to take care of this pleasant patient.   In short, Joe Green is presenting with OSA/ COPD overlap and that alone would exclude him from inspire. I plan to follow up either personally or through our NP within 3-4 months.   CC: I will share my notes with PCP.  Electronically signed by: Larey Seat, MD 04/11/2022 9:16 AM  [facility] Sleep Board certified by The [employer] of Sleep Medicine and Diplomate of the [employer] of Sleep Medicine. Board certified In Neurology through the [place], Fellow of the [employer] of Neurology. Medical Director of [employer].

## 2022-04-14 ENCOUNTER — Other Ambulatory Visit: Payer: Medicare Other

## 2022-04-30 ENCOUNTER — Other Ambulatory Visit: Payer: Self-pay | Admitting: Internal Medicine

## 2022-04-30 DIAGNOSIS — E119 Type 2 diabetes mellitus without complications: Secondary | ICD-10-CM

## 2022-05-04 ENCOUNTER — Other Ambulatory Visit: Payer: Medicare Other

## 2022-05-04 DIAGNOSIS — M545 Low back pain, unspecified: Secondary | ICD-10-CM | POA: Diagnosis not present

## 2022-05-11 DIAGNOSIS — M5416 Radiculopathy, lumbar region: Secondary | ICD-10-CM | POA: Diagnosis not present

## 2022-05-12 ENCOUNTER — Other Ambulatory Visit: Payer: Self-pay | Admitting: Nurse Practitioner

## 2022-05-16 ENCOUNTER — Telehealth: Payer: Self-pay

## 2022-05-16 NOTE — Telephone Encounter (Signed)
LM-05/16/22-Chart review started, Reviewing OV, consults, hospital visits, and medication changes for CP focused outreach call on 10/10. Ov's and medication gaps updated in [facility] protocol.  Total time spent: 16 min.

## 2022-05-17 NOTE — Telephone Encounter (Signed)
LM-05/17/22-Reviewed pts. Chart quickly and updated in focused outreach protocol for CP. Called pt. And confirmed CP focused outreach call for 10/10 between 3-5pm. Updated pt. Panel confirming visit.  Total time spent: 10 min.

## 2022-05-19 ENCOUNTER — Telehealth: Payer: Self-pay | Admitting: Pharmacy Technician

## 2022-05-19 DIAGNOSIS — Z596 Low income: Secondary | ICD-10-CM

## 2022-05-19 NOTE — Progress Notes (Signed)
[facility])                                            [place] Team    05/19/2022  Joe Green August 18, 1950 [id number]                                      Medication Assistance Referral-FOR 2024 RE ENROLLMENT  Referral From:  [facility]  [name]  Medication/Company: [name] / [name] Patient application portion:  Mailed Provider application portion: Faxed  to Dr. [name] Provider address/fax verified via: Office website  [name] P. [name], [name]  [phone]

## 2022-05-23 ENCOUNTER — Ambulatory Visit: Payer: Medicare Other | Admitting: Pharmacy Technician

## 2022-05-23 DIAGNOSIS — Z79899 Other long term (current) drug therapy: Secondary | ICD-10-CM

## 2022-05-23 DIAGNOSIS — E1159 Type 2 diabetes mellitus with other circulatory complications: Secondary | ICD-10-CM

## 2022-05-23 DIAGNOSIS — F17200 Nicotine dependence, unspecified, uncomplicated: Secondary | ICD-10-CM

## 2022-05-23 NOTE — Progress Notes (Signed)
Reached patient today for outreach call. He states he received his [name] shipment today and was getting ready to take his first dose. His BG has been running about 150 per patient. Counseling done on possibility of N/V/D with restarting [name].     He did let me know he has been working on smoking cessation. He is down to 1/2 PPD now. He is still using Chantix but says he is going to get back on the patches and try to quit by end of year.     CPP Televisit: 71mn  AMarda Stalker PharmD Clinical Pharmacist ANaida Sleightbelton'[email or web address] .care [phone]

## 2022-05-25 ENCOUNTER — Ambulatory Visit
Admission: RE | Admit: 2022-05-25 | Discharge: 2022-05-25 | Disposition: A | Discharge status: Home / Self Care | Payer: Medicare Other | Source: Ambulatory Visit | Attending: Pulmonary Disease | Admitting: Pulmonary Disease

## 2022-05-25 DIAGNOSIS — I700 Atherosclerosis of aorta: Secondary | ICD-10-CM | POA: Diagnosis not present

## 2022-05-25 DIAGNOSIS — J432 Centrilobular emphysema: Secondary | ICD-10-CM | POA: Diagnosis not present

## 2022-05-25 DIAGNOSIS — J449 Chronic obstructive pulmonary disease, unspecified: Secondary | ICD-10-CM

## 2022-05-25 DIAGNOSIS — J8410 Pulmonary fibrosis, unspecified: Secondary | ICD-10-CM | POA: Diagnosis not present

## 2022-05-25 DIAGNOSIS — J929 Pleural plaque without asbestos: Secondary | ICD-10-CM | POA: Diagnosis not present

## 2022-05-30 ENCOUNTER — Telehealth: Payer: Self-pay | Admitting: Neurology

## 2022-05-30 ENCOUNTER — Other Ambulatory Visit: Payer: Self-pay | Admitting: Internal Medicine

## 2022-05-30 DIAGNOSIS — E1159 Type 2 diabetes mellitus with other circulatory complications: Secondary | ICD-10-CM

## 2022-05-30 NOTE — Telephone Encounter (Signed)
SPLIT - see split instructions per MD (no auth req'd per Albina Billet. Ref# Albina Billet. on 05/08/22).  Patient is scheduled at [facility] for 07/02/22 at 9 pm.  Mailed packet to the patient.

## 2022-06-09 DIAGNOSIS — Z20822 Contact with and (suspected) exposure to covid-19: Secondary | ICD-10-CM | POA: Diagnosis not present

## 2022-06-13 DIAGNOSIS — E1169 Type 2 diabetes mellitus with other specified complication: Secondary | ICD-10-CM | POA: Diagnosis not present

## 2022-06-13 DIAGNOSIS — E785 Hyperlipidemia, unspecified: Secondary | ICD-10-CM | POA: Diagnosis not present

## 2022-06-13 DIAGNOSIS — I2510 Atherosclerotic heart disease of native coronary artery without angina pectoris: Secondary | ICD-10-CM | POA: Diagnosis not present

## 2022-06-13 DIAGNOSIS — I10 Essential (primary) hypertension: Secondary | ICD-10-CM | POA: Diagnosis not present

## 2022-06-19 ENCOUNTER — Ambulatory Visit: Payer: Medicare Other | Admitting: Pulmonary Disease

## 2022-06-21 ENCOUNTER — Other Ambulatory Visit: Payer: Self-pay | Admitting: Nurse Practitioner

## 2022-06-21 DIAGNOSIS — N401 Enlarged prostate with lower urinary tract symptoms: Secondary | ICD-10-CM

## 2022-06-22 ENCOUNTER — Telehealth: Payer: Self-pay

## 2022-06-22 NOTE — Progress Notes (Signed)
Patient assistance renewal due for patient. Current assistance ends 08/13/22. I have spoken with patient and filled in form. He will come into the office for an appointment on 06/29/22 and will bring proof of his income and sign form at this visit and leave at front desk for [place] to fax.I will have Dr. Melford Aase sign his portion as well. Form is located in documents in [employer].   Hildred Alamin, HC  Time spent: 47mn.

## 2022-06-28 NOTE — Progress Notes (Unsigned)
Complete Physical  Assessment and Plan:   Encounter for general adult medical examination with abnormal findings Due Yearly  Essential hypertension Continue medications Monitor blood pressure at home; call if consistently over 130/80 Continue DASH diet.   Reminder to go to the ER if any CP, SOB, nausea, dizziness, severe HA, changes vision/speech, left arm numbness and tingling and jaw pain. 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  Denies angina; normal myoview 07/2019 per Dr. Johnsie Cancel Continue cardio follow up Stop smoking  OSA and COPD overlap syndrome ([place]) Is looking into getting new mouthpiece, does not use CPAP  Chronic obstructive pulmonary disease, unspecified COPD type (HCC)/ Pulmonary Fibrosis(HCC) Continue inhalers, stop smoking, has recent CT lung 05/2022 Continue to follow with pulmonology Script for Albuterol 2 puffs q 6 hours PRN sent to pharmacy  Type 2 diabetes mellitus with other circulatory complication, without long-term current use of insulin [facility]) Education: Reviewed 'ABCs' of diabetes management (respective goals in parentheses):  A1C (<7), blood pressure (<130/80), and cholesterol (LDL <70) Eye Exam yearly and Dental Exam every 6 months - reminded due for annual diabetic eye exam - he will schedule with Dr. Katy Fitch Glimepiride 4 mg at supper, Metformin 500 mg 1 tab daily and is to be increasing [name] to 3 mg SQ QW Dietary recommendations Physical Activity recommendations Foot exam completed - normal However diminished post tib pulses - smoker - never had ABI - ordered today  A1c  Benign prostatic hyperplasia with urinary frequency Doxazosin and finasteride Followed by urology  Vitamin D deficiency Continue supplementation Check vitamin D level  Smoking Discussed risks associated with  tobacco use and advised to reduce or quit Patient is ready to do so and plans to taper, continue CHantix Will follow up at the next visit Strategies for success discussed.  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  Check lipid panel.  CMP TSH  Medication management CBC, CMP/GFR, UA, magnesium  Elevated PSA Check PSA Followed by urology   Overweight (BMI 25.0-29.9) Long discussion about weight loss, diet, and exercise Recommended diet heavy in fruits and veggies and low in animal meats, cheeses, and dairy products, appropriate calorie intake Discussed appropriate weight for height  Follow up at next visit  Aortic atherosclerosis ([place]) Control blood pressure, cholesterol, glucose, increase exercise.   B12 deficiency  Recheck levels; never started supplement; recommend subligual supplement to maintain levels 500+   Diminished pulses Smoker, T2DM, normal ABI 2021  Screening for thyroid disorder - TSH  Screening for ischemic heart disease - EKG  Screening for hematuria/proteinuria - Routine UA with reflex microscopic - Microalbumin/creatinine urine ratio  Screening for AAA - U/S ABD RETROPERITONEAL LTD    Discussed med's effects and SE's. Screening labs and tests as requested with regular follow-up as recommended. Over 40 minutes of exam, counseling, chart review and critical decision making was performed  Future Appointments  Date Time Provider [place]  07/02/2022  9:00 PM [facility] None  07/18/2022 11:00 AM [facility] PFT ROOM [facility] None  07/18/2022  1:30 PM Mannam, Praveen, MD [facility] None  11/30/2022 10:00 AM Alycia Rossetti, NP [facility] None  07/03/2023  10:00 AM  Alycia Rossetti, NP [facility] None     HPI 71 y.o. male patient presents for a complete physical. He has Hyperlipidemia associated with type 2 diabetes mellitus ([place]); CAD (coronary artery disease); Hypertension; OSA and COPD overlap syndrome ([place]); Smoking; Vitamin D deficiency; Medication management; Type 2 diabetes mellitus (HCC); RLS (restless legs syndrome); COPD (chronic obstructive pulmonary disease) (HCC); BPH (benign prostatic hyperplasia); Elevated PSA; Chondromalacia, left knee; Chondromalacia, right knee; Overweight (BMI 25.0-29.9); Aortic atherosclerosis (HCC)by Chest CT scan 11/2016; Hepatic steatosis; B12 deficiency; History of colonic polyps; Excessive drinking alcohol; ILD (interstitial lung disease) ([place]); and Gallstones on their problem list.  He is married with 3 kids, 1 is his. 110 Grandchildren. Retired recently in 2021 from [employer], job of 33 years. He does find he misses his job at times.  Spending lots of time at the beach.      he is a smoker, cutting down, but admits smoking average 1/2 pack starting to decrease; estimated 50 pack year history; has been trying to cut down.  Chantix used currently but does not remember to take regularly He had Ct lung in 05/25/22 which did not show any concerning nodules.  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 He takes Breo regularly, has never had rescue inhaler but does get occasional shortness of breath even with regular Breo use   OSA, intolerant of CPAP and had been using mouth piece but he feels didn't work well; managed by Dr. Toy Cookey, has been recommended repeat sleep study but patient adamantly declines repeat. He is not using his CPAP, has repeat sleep study this Sunday    He has RLS,  Requip now but does not control symptoms completely. His daughter uses Pramiprexole.   BMI is Body mass index is 27.47 kg/m., he has been working on diet and admits less  active since retiring, but "not sitting." staying busy around the home.  He is 194 at home Admits to 2 beers a couple nights a week, 1 cup coffee daily  Generally watching diet  Wt Readings from Last 3 Encounters:  06/29/22 186 lb (84.4 kg)  04/11/22 188 lb (85.3 kg)  03/29/22 187 lb 6.4 oz (85 kg)   He has a history of CAD s/p PCI to RCA in 2005. He underwent LHC in 2006 and 2012 which showed a proximal LAD 60% (unchanged from previous catheterization), Medical therapy recommended. Has done well since cath in 2012 and followed by Dr. Kyla Balzarine office, had normal follow up myoview in 07/2019.   His blood pressure has been controlled at home, today their BP is BP: 120/62  BP Readings from Last 3 Encounters:  06/29/22 120/62  04/11/22 119/64  03/29/22 116/62  He does not workout.Marland Kitchen He denies chest pain, shortness of breath, dizziness.    He is on cholesterol medication (rosuvastastin 20 mg three days a week) and denies myalgias. His cholesterol is at goal. The cholesterol last visit was:   Lab Results  Component Value Date   CHOL 111 03/29/2022   HDL 44 03/29/2022   LDLCALC 50 03/29/2022   TRIG 89 03/29/2022   CHOLHDL 2.5 03/29/2022   He has been working on diet and exercise for diabetes (on metformin 500 mg QD, glimepiride 4 mg at night and [name] 1.5 mg weekly- was to increase to 3 mg which should be next delivery this month ), he is on bASA, he is on ACE/ARB and denies foot ulcerations, increased appetite, nausea,  paresthesia of the feet, polydipsia, polyuria, visual disturbances, vomiting and weight loss.  Normal ABI 2021 He does check fasting sugars ranging 148 Last A1C in the office was:  Lab Results  Component Value Date   HGBA1C 8.8 (H) 03/29/2022   Last GFR: Lab Results  Component Value Date   EGFR 95 03/29/2022     Patient is on Vitamin D supplement, 10000 IU daily    Lab Results  Component Value Date   VD25OH 88 09/29/2021     Has hx of elevated PSAs, BPH with  LUTS (nocturia x 2-3 night) and is followed by [facility]. He is on doxazosin and finasteride.  Lab Results  Component Value Date   PSA 2.07 06/29/2021   PSA 2.4 04/23/2020   PSA 3.3 03/10/2019   B12 was low, admits didn't start supplement Lab Results  Component Value Date   VITAMINB12 217 03/29/2022     Current Medications:  Current Outpatient Medications on File Prior to Visit  Medication Sig Dispense Refill   acetaminophen (TYLENOL) 500 MG tablet Take 2 tablets (1,000 mg total) by mouth every 6 (six) hours. 30 tablet 0   aspirin EC 81 MG tablet Take 1 tablet (81 mg total) by mouth daily. 90 tablet 3   blood glucose meter kit and supplies Dispense based on patient and insurance preference. Use up to four times daily as directed. (FOR ICD-9 250.00, 250.01). 1 each 0   Cholecalciferol (VITAMIN D) 50 MCG (2000 UT) tablet Take 1 tablet (2,000 Units total) by mouth daily. (Patient taking differently: Take 10,000 Units by mouth daily.)     Cinnamon 500 MG TABS Take 1 tablet by mouth in the morning and at bedtime.     doxazosin (CARDURA) 8 MG tablet Take  1 tablet  at Bedtime  for BP & Prostate 90 tablet 3   Dulaglutide ([name]) 3 CN/4.7SJ SOPN Inject 3 mg as directed once a week. 2 mL 3   fexofenadine (ALLEGRA) 60 MG tablet Take 60 mg by mouth daily as needed for allergies or rhinitis.     finasteride (PROSCAR) 5 MG tablet TAKE 1 TABLET BY MOUTH ONCE DAILY FOR PROSTATE 90 tablet 0   fluticasone furoate-vilanterol (BREO ELLIPTA) 100-25 MCG/ACT AEPB Use 1 inhalation Daily for COPD / Asthma 180 each 3   glucose blood test strip Test blood sugars once daily.Dx: E11.9 100 each 2   meloxicam (MOBIC) 15 MG tablet Take 15 mg by mouth daily as needed.     metFORMIN (GLUCOPHAGE) 1000 MG tablet Take  1 tablet   2 x /day  with Meals for Diabetes                                                  /                            TAKE                              BY                            MOUTH 180 tablet 3   Nicotine 21-14-7 MG/24HR KIT Place 1 patch onto the skin  daily in the afternoon. 1 kit 0   nitroGLYCERIN (NITROSTAT) 0.4 MG SL tablet Place 1 tablet (0.4 mg total) under the tongue every 5 (five) minutes as needed for chest pain. 25 tablet 3   ramipril (ALTACE) 5 MG capsule TAKE 1 CAPSULE BY MOUTH ONCE DAILY FOR BLOOD PRESSURE AND  DIABETIC  KIDNEY  PROTECTION 90 capsule 0   rOPINIRole (REQUIP) 3 MG tablet Take  1 tablet 3 x /day as needed for Restless legs                              /                                                 TAKE                           BY                     MOUTH 270 tablet 1   rosuvastatin (CRESTOR) 20 MG tablet Take 1 tab three nights a week for cholesterol. 90 tablet 1   varenicline (CHANTIX CONTINUING MONTH PAK) 1 MG tablet Take 1 tablet (1 mg total) by mouth 2 (two) times daily. 60 tablet 3   glimepiride (AMARYL) 4 MG tablet TAKE 1/2-1 TABLET BY MOUTH TWICE DAILY WITH MEALS FOR DIABETES 180 tablet 1   methocarbamol (ROBAXIN) 500 MG tablet Take 1 tablet by mouth daily as needed.     No current facility-administered medications on file prior to visit.   Allergies:  No Known Allergies Health Maintenance:  Immunization History  Administered Date(s) Administered   Influenza, High Dose Seasonal PF 06/22/2017, 08/03/2018, 05/15/2019, 07/28/2020, 06/29/2021, 06/15/2022   Influenza-Unspecified 06/02/2013, 08/18/2014   PFIZER(Purple Top)SARS-COV-2 Vaccination 11/19/2019, 12/10/2019   Pneumococcal Conjugate-13 11/21/2016   Pneumococcal Polysaccharide-23 08/27/2007, 01/01/2018   Td 01/01/2018   Tdap 08/27/2007   Zoster, Live 10/20/2013   Tetanus: 2019 Pneumovax: 2009, 2019 Prevnar 13: 2018 Flu vaccine: 08/15/21 Zostavax: 201/5 Covid 19: 2/2, 2021, pfizer- patient will send information for documenting    DEXA: n/a Colonoscopy:04/19/21 due 2025  EGD: -  CT lung:  04/2021- aortic atherosclerosis, emphysema   Stress test: 07/2019  Names of  Other Physician/Practitioners you currently use: 1.  [facility] here for primary care 2. Dr. Katy Fitch, eye doctor, last visit 09/07/20 3. Dr. ?, dentist, last visit remote, had scheduled but needs to find new dentist due to insurance    Patient Care Team: [name], MD as PCP - General (Internal Medicine) Josue Hector, MD as PCP - Cardiology (Cardiology) Josue Hector, MD as Consulting Physician (Cardiology) Ladene Artist, MD as Consulting Physician (Gastroenterology) [name], [facility] (Inactive) as Pharmacist (Pharmacist)  Medical History:  has Hyperlipidemia associated with type 2 diabetes mellitus [facility]); CAD (coronary artery disease); Hypertension; OSA and COPD overlap syndrome ([place]); Smoking; Vitamin D deficiency; Medication management; Type 2 diabetes mellitus (HCC); RLS (restless legs syndrome); COPD (chronic obstructive pulmonary disease) (HCC); BPH (benign prostatic hyperplasia); Elevated PSA; Chondromalacia, left knee; Chondromalacia, right knee; Overweight (BMI 25.0-29.9); Aortic atherosclerosis (HCC)by Chest CT scan 11/2016; Hepatic steatosis; B12 deficiency; History of colonic polyps; Excessive drinking alcohol; ILD (interstitial lung disease) ([place]); and Gallstones on their  problem list. Surgical History:  He  has a past surgical history that includes heart stent (2005); Colonoscopy (11/2011); Polypectomy; Cardiac catheterization (2005); Inguinal hernia repair (Right, 11/16/2014); Insertion of mesh (Right, 11/16/2014); Knee arthroscopy with medial menisectomy (Bilateral, 06/25/2017); and Chondroplasty (06/25/2017). Family History:  His family history includes Cancer in his mother; Hypertension in his father; Kidney cancer in his maternal grandmother; Prostate cancer in his father. Social History:   reports that he has been smoking cigarettes. He started smoking about 53 years ago. He has a 25.50 pack-year smoking history. He has never  used smokeless tobacco. He reports current alcohol use of about 14.0 standard drinks of alcohol per week. He reports that he does not use drugs. Review of Systems:  Review of Systems  Constitutional:  Negative for chills, fever, malaise/fatigue and weight loss.       Restless legs  HENT:  Negative for congestion, hearing loss, sinus pain, sore throat and tinnitus.   Eyes:  Negative for blurred vision and double vision.  Respiratory:  Negative for cough, hemoptysis, sputum production, shortness of breath and wheezing.   Cardiovascular:  Negative for chest pain, palpitations, orthopnea, claudication, leg swelling and PND.  Gastrointestinal:  Negative for abdominal pain, blood in stool, constipation, diarrhea, heartburn, melena, nausea and vomiting.  Genitourinary: Negative.  Negative for dysuria and urgency.       Nocturia x 2  Musculoskeletal:  Negative for back pain, falls, joint pain, myalgias and neck pain.  Skin:  Negative for rash.  Neurological:  Negative for dizziness, tingling, tremors, sensory change, weakness and headaches.  Endo/Heme/Allergies:  Positive for environmental allergies. Negative for polydipsia. Does not bruise/bleed easily.  Psychiatric/Behavioral:  Negative for depression, memory loss, substance abuse and suicidal ideas. The patient has insomnia (intermittent with RLS symptoms). The patient is not nervous/anxious.   All other systems reviewed and are negative.   Physical Exam: Estimated body mass index is 27.47 kg/m as calculated from the following:   Height as of this encounter: _0  (1.753 m).   Weight as of this encounter: 186 lb (84.4 kg). BP 120/62   Pulse 74   Temp 97.7 F (36.5 C)   Ht _1  (1.753 m)   Wt 186 lb (84.4 kg)   SpO2 96%   BMI 27.47 kg/m  General Appearance: Well nourished, in no apparent distress.  Eyes: PERRLA, EOMs, conjunctiva no swelling or erythema, normal fundi and vessels.  Sinuses: No Frontal/maxillary tenderness  ENT/Mouth:  Ext aud canals clear, normal light reflex with TMs without erythema, bulging. Good dentition. No erythema, swelling, or exudate on post pharynx. Tonsils not swollen or erythematous. Hearing normal.  Neck: Supple, thyroid normal. No bruits  Respiratory: Respiratory effort normal, BS equal bilaterally without rales, rhonchi, wheezing or stridor.  Cardio: RRR without murmurs, rubs or gallops. Intact brisk pedal pulses, post tibial pulses are faint,  without edema.  Chest: symmetric, with normal excursions and percussion.  Abdomen: Soft, nontender, no guarding, rebound, masses, or organomegaly. Mild ventral hernia with bearing down.  Lymphatics: Non tender without lymphadenopathy.  Genitourinary: No concerns, declines; urology following Musculoskeletal: Full ROM all peripheral extremities,5/5 strength, and normal gait.  Skin: Warm, dry . Neuro: Cranial nerves intact, reflexes equal bilaterally. Normal muscle tone, no cerebellar symptoms. Sensation intact.  Psych: Awake and oriented X 3, normal affect, Insight and Judgment appropriate.   EKG: Sinus rhythm,  RBBB AAA: < 3 cm  Odin Mariani E  10:30 AM [facility]

## 2022-06-29 ENCOUNTER — Encounter: Payer: Self-pay | Admitting: Nurse Practitioner

## 2022-06-29 ENCOUNTER — Ambulatory Visit (INDEPENDENT_AMBULATORY_CARE_PROVIDER_SITE_OTHER): Payer: Medicare Other | Admitting: Nurse Practitioner

## 2022-06-29 VITALS — BP 120/62 | HR 74 | Temp 97.7°F | Ht 69.0 in | Wt 186.0 lb

## 2022-06-29 DIAGNOSIS — E1169 Type 2 diabetes mellitus with other specified complication: Secondary | ICD-10-CM

## 2022-06-29 DIAGNOSIS — F17200 Nicotine dependence, unspecified, uncomplicated: Secondary | ICD-10-CM

## 2022-06-29 DIAGNOSIS — I10 Essential (primary) hypertension: Secondary | ICD-10-CM | POA: Diagnosis not present

## 2022-06-29 DIAGNOSIS — Z136 Encounter for screening for cardiovascular disorders: Secondary | ICD-10-CM

## 2022-06-29 DIAGNOSIS — I700 Atherosclerosis of aorta: Secondary | ICD-10-CM | POA: Diagnosis not present

## 2022-06-29 DIAGNOSIS — G2581 Restless legs syndrome: Secondary | ICD-10-CM

## 2022-06-29 DIAGNOSIS — Z0000 Encounter for general adult medical examination without abnormal findings: Secondary | ICD-10-CM

## 2022-06-29 DIAGNOSIS — E1122 Type 2 diabetes mellitus with diabetic chronic kidney disease: Secondary | ICD-10-CM | POA: Diagnosis not present

## 2022-06-29 DIAGNOSIS — Z0001 Encounter for general adult medical examination with abnormal findings: Secondary | ICD-10-CM

## 2022-06-29 DIAGNOSIS — N401 Enlarged prostate with lower urinary tract symptoms: Secondary | ICD-10-CM

## 2022-06-29 DIAGNOSIS — Z1329 Encounter for screening for other suspected endocrine disorder: Secondary | ICD-10-CM

## 2022-06-29 DIAGNOSIS — J449 Chronic obstructive pulmonary disease, unspecified: Secondary | ICD-10-CM

## 2022-06-29 DIAGNOSIS — E663 Overweight: Secondary | ICD-10-CM

## 2022-06-29 DIAGNOSIS — Z79899 Other long term (current) drug therapy: Secondary | ICD-10-CM | POA: Diagnosis not present

## 2022-06-29 DIAGNOSIS — E538 Deficiency of other specified B group vitamins: Secondary | ICD-10-CM

## 2022-06-29 DIAGNOSIS — I2510 Atherosclerotic heart disease of native coronary artery without angina pectoris: Secondary | ICD-10-CM

## 2022-06-29 DIAGNOSIS — E1159 Type 2 diabetes mellitus with other circulatory complications: Secondary | ICD-10-CM

## 2022-06-29 DIAGNOSIS — Z720 Tobacco use: Secondary | ICD-10-CM

## 2022-06-29 DIAGNOSIS — E559 Vitamin D deficiency, unspecified: Secondary | ICD-10-CM

## 2022-06-29 DIAGNOSIS — Z1389 Encounter for screening for other disorder: Secondary | ICD-10-CM

## 2022-06-29 DIAGNOSIS — R972 Elevated prostate specific antigen [PSA]: Secondary | ICD-10-CM

## 2022-06-29 DIAGNOSIS — J8410 Pulmonary fibrosis, unspecified: Secondary | ICD-10-CM

## 2022-06-29 DIAGNOSIS — N182 Chronic kidney disease, stage 2 (mild): Secondary | ICD-10-CM | POA: Diagnosis not present

## 2022-06-29 MED ORDER — ALBUTEROL SULFATE HFA 108 (90 BASE) MCG/ACT IN AERS: 2.0000 | INHALATION_SPRAY | Freq: Four times a day (QID) | RESPIRATORY_TRACT | 2 refills | Status: AC | PRN

## 2022-06-29 MED ORDER — GLIMEPIRIDE 4 MG PO TABS: ORAL_TABLET | ORAL | 1 refills | Status: DC

## 2022-06-29 MED ORDER — PRAMIPEXOLE DIHYDROCHLORIDE 0.5 MG PO TABS: 0.5000 mg | ORAL_TABLET | Freq: Every day | ORAL | 2 refills | Status: DC

## 2022-06-29 MED ORDER — HYDROXYZINE HCL 25 MG PO TABS: 50.0000 mg | ORAL_TABLET | Freq: Three times a day (TID) | ORAL | 0 refills | Status: DC | PRN

## 2022-06-29 NOTE — Patient Instructions (Addendum)
Can take Hydroxyzine at night for sleep  Stop Ropinirole and begin Pramipexole at bedtime  Albuterol inhaler 2 puffs every 6 hours as needed for shortness of breath  GENERAL HEALTH GOALS   Know what a healthy weight is for you (roughly BMI <25) and aim to maintain this   Aim for 7+ servings of fruits and vegetables daily   70-80+ fluid ounces of water or unsweet tea for healthy kidneys   Limit to max 1 drink of alcohol per day; avoid smoking/tobacco   Limit animal fats in diet for cholesterol and heart health - choose grass fed whenever available   Avoid highly processed foods, and foods high in saturated/trans fats   Aim for low stress - take time to unwind and care for your mental health   Aim for 150 min of moderate intensity exercise weekly for heart health, and weights twice weekly for bone health   Aim for 7-9 hours of sleep daily

## 2022-06-30 LAB — URINALYSIS, ROUTINE W REFLEX MICROSCOPIC
Bilirubin Urine: NEGATIVE
Hgb urine dipstick: NEGATIVE
Leukocytes,Ua: NEGATIVE
Nitrite: NEGATIVE
Protein, ur: NEGATIVE
Specific Gravity, Urine: 1.019 (ref 1.001–1.035)
pH: 5.5 (ref 5.0–8.0)

## 2022-06-30 LAB — COMPLETE METABOLIC PANEL WITH GFR
AG Ratio: 1.5 (calc) (ref 1.0–2.5)
ALT: 13 U/L (ref 9–46)
AST: 11 U/L (ref 10–35)
Albumin: 3.9 g/dL (ref 3.6–5.1)
Alkaline phosphatase (APISO): 87 U/L (ref 35–144)
BUN/Creatinine Ratio: 12 (calc) (ref 6–22)
BUN: 8 mg/dL (ref 7–25)
CO2: 28 mmol/L (ref 20–32)
Calcium: 9.1 mg/dL (ref 8.6–10.3)
Chloride: 102 mmol/L (ref 98–110)
Creat: 0.69 mg/dL — ABNORMAL LOW (ref 0.70–1.28)
Globulin: 2.6 g/dL (calc) (ref 1.9–3.7)
Glucose, Bld: 143 mg/dL — ABNORMAL HIGH (ref 65–99)
Potassium: 4.2 mmol/L (ref 3.5–5.3)
Sodium: 136 mmol/L (ref 135–146)
Total Bilirubin: 0.4 mg/dL (ref 0.2–1.2)
Total Protein: 6.5 g/dL (ref 6.1–8.1)
eGFR: 100 mL/min/{1.73_m2} (ref >=60)

## 2022-06-30 LAB — LIPID PANEL
Cholesterol: 113 mg/dL (ref <200)
HDL: 45 mg/dL (ref >=40)
LDL Cholesterol (Calc): 52 mg/dL (calc)
Non-HDL Cholesterol (Calc): 68 mg/dL (calc) (ref <130)
Total CHOL/HDL Ratio: 2.5 (calc) (ref <5.0)
Triglycerides: 80 mg/dL (ref <150)

## 2022-06-30 LAB — CBC WITH DIFFERENTIAL/PLATELET
Absolute Monocytes: 604 cells/uL (ref 200–950)
Basophils Absolute: 51 cells/uL (ref 0–200)
Basophils Relative: 0.6 %
Eosinophils Absolute: 400 cells/uL (ref 15–500)
Eosinophils Relative: 4.7 %
HCT: 44.7 % (ref 38.5–50.0)
Hemoglobin: 15.3 g/dL (ref 13.2–17.1)
Lymphs Abs: 1760 cells/uL (ref 850–3900)
MCH: 31.2 pg (ref 27.0–33.0)
MCHC: 34.2 g/dL (ref 32.0–36.0)
MCV: 91 fL (ref 80.0–100.0)
MPV: 11.8 fL (ref 7.5–12.5)
Monocytes Relative: 7.1 %
Neutro Abs: 5687 cells/uL (ref 1500–7800)
Neutrophils Relative %: 66.9 %
Platelets: 172 10*3/uL (ref 140–400)
RBC: 4.91 10*6/uL (ref 4.20–5.80)
RDW: 12.3 % (ref 11.0–15.0)
Total Lymphocyte: 20.7 %
WBC: 8.5 10*3/uL (ref 3.8–10.8)

## 2022-06-30 LAB — HEMOGLOBIN A1C
Hgb A1c MFr Bld: 7.5 % of total Hgb — ABNORMAL HIGH (ref <5.7)
Mean Plasma Glucose: 169 mg/dL
eAG (mmol/L): 9.3 mmol/L

## 2022-06-30 LAB — MAGNESIUM: Magnesium: 1.9 mg/dL (ref 1.5–2.5)

## 2022-06-30 LAB — PSA: PSA: 1.57 ng/mL (ref <=4.00)

## 2022-06-30 LAB — MICROALBUMIN / CREATININE URINE RATIO
Creatinine, Urine: 145 mg/dL (ref 20–320)
Microalb Creat Ratio: 20 mcg/mg creat (ref <30)
Microalb, Ur: 2.9 mg/dL

## 2022-06-30 LAB — TSH: TSH: 0.88 mIU/L (ref 0.40–4.50)

## 2022-06-30 LAB — VITAMIN D 25 HYDROXY (VIT D DEFICIENCY, FRACTURES): Vit D, 25-Hydroxy: 72 ng/mL (ref 30–100)

## 2022-07-02 ENCOUNTER — Ambulatory Visit (INDEPENDENT_AMBULATORY_CARE_PROVIDER_SITE_OTHER): Payer: Medicare Other | Admitting: Neurology

## 2022-07-02 DIAGNOSIS — G2581 Restless legs syndrome: Secondary | ICD-10-CM

## 2022-07-02 DIAGNOSIS — F17200 Nicotine dependence, unspecified, uncomplicated: Secondary | ICD-10-CM

## 2022-07-02 DIAGNOSIS — G4733 Obstructive sleep apnea (adult) (pediatric): Secondary | ICD-10-CM | POA: Diagnosis not present

## 2022-07-02 DIAGNOSIS — I2510 Atherosclerotic heart disease of native coronary artery without angina pectoris: Secondary | ICD-10-CM

## 2022-07-02 DIAGNOSIS — J449 Chronic obstructive pulmonary disease, unspecified: Secondary | ICD-10-CM

## 2022-07-04 ENCOUNTER — Telehealth: Payer: Self-pay

## 2022-07-04 NOTE — Telephone Encounter (Signed)
LM-07/04/22-Called pt. And informed him that his PAP form for [name] is ready and that he needs to come into the office to sign and bring his income documentation. Pt. Verbalized understanding and agreed.   Total time spent: 4 min.

## 2022-07-08 NOTE — Progress Notes (Signed)
Patient has a pulmonology appointment on 07-19-2022. Please address possible nocturnal oxygen as I wrote in my recommendations.  He is not a candidate for Inspire, his AHI is too mild and his apnea is positional, his hypoxemia would not be addressed with inspire. He should not sleep on a flat but on an elevated head of bed and not in supine.

## 2022-07-08 NOTE — Procedures (Signed)
[facility] POLYSOMNOGRAPHY  INTERPRETATION REPORT   STUDY DATE:  07/02/2022     PATIENT NAME:  Joe Green         DATE OF BIRTH:  03/12/1951  PATIENT ID:  [id number]    TYPE OF STUDY:  PSG  READING PHYSICIAN: Larey Seat, MD REFERRED BY:  SCORING TECHNICIAN: Richard Miu, RPSGT   HISTORY:  This 71 year-old Male patient was seen on 04-11-2022 upon referral by PCP, Dr Melford Aase office.  He has a known COPD and OSA overlap disorder.  Last PSG in lab 2014 , was started on CPAP. He used the machine but wasn't as compliant. He has not been using the CPAP but has OSA and COPD, DM2, had 2005 dx CAD with stents. I liked CPAP but it kept me from sleeping on my side- sleep study was done at [place] [facility]-  he doesn't recall any follow up. He is interested in [place].    I have the pleasure of seeing Joe Green today, a right -handed Caucasian male with a history of OSA and COPD, sleep disorder.  He  has a past medical history of CAD (coronary artery disease), GERD (gastroesophageal reflux disease), HLD (hyperlipidemia), Hypertension, Obesity, OSA (obstructive sleep apnea), Type II or unspecified type diabetes mellitus without mention of complication, not stated as uncontrolled, Vitamin D deficiency  He is actively followed by Dr. Hart Robinsons, Pulmonology.  ADDITIONAL INFORMATION:  The Epworth Sleepiness Scale endorsed at a 12 /24 points (scores above or equal to 10 are suggestive of hypersomnolence). FSS endorsed at 33 /63 points. Height: 70 in Weight: 188 lbs (BMI 26) Neck Size: 16 in  MEDICATIONS: Tylenol, Aspirin, Vitamin D, Cinnamon, Cardura, [name], Allegra, Proscar, Breo Ellipta, Amaryl, Glucophage, Nicotine, Nitrostat, Altace, Requip, Crestor, Chantix, Mobic, Robaxin.  TECHNICAL DESCRIPTION: A registered sleep technologist ( RPSGT)  was in attendance for the duration of the recording.  Data collection, scoring, video monitoring, and  reporting were performed in compliance with the AASM Manual for the Scoring of Sleep and Associated Events; (Hypopnea is scored based on the criteria listed in Section VIII D. 1b in the AASM Manual V2.6 using a 4% oxygen desaturation rule or Hypopnea is scored based on the criteria listed in Section VIII D. 1a in the AASM Manual V2.6 using 3% oxygen desaturation and /or arousal rule).   SLEEP CONTINUITY AND SLEEP ARCHITECTURE:  Lights-out was at 22:10: and lights-on at  05:24:, with  7.3 hours  of recording time . Total sleep time (TST) was 227.0 minutes with a decreased sleep efficiency at 52.2%.   Sleep latency was increased at 149.0 minutes.  REM sleep latency was normal at 68.0 minutes. Of the total sleep time, the percentage of stage N1 sleep was 11.2%, stage N2 sleep was 54%, stage N3 sleep was 12.6%, and REM sleep was 22.7%. BODY POSITION:  TST was divided between the following sleep positions: Sleep in supine 73 minutes (32%), non-supine 154 minutes (68%); right 153 minutes (68%), left 00 minutes (0%), and prone 00 minutes (0%).  There were 4 Stage R periods observed on this study night, 35 awakenings (i.e. transitions to Stage W from any sleep stage), and 107 total stage transitions. Wake after sleep onset (WASO) time accounted for 58 minutes.   RESPIRATORY MONITORING:  Based on CMS criteria (using a 4% oxygen desaturation rule for scoring hypopneas), there were 22 apneas (22 obstructive; 0 central; 0 mixed), and 45 hypopneas.  Apnea index was 5.8/h.  Hypopnea index was 11.9/h. The AHI(apnea-hypopnea index) was 17.7/h overall (53.9 supine, 0 non-supine; 7.0 REM, 72.0 supine REM). There were 0 respiratory effort-related arousals (RERAs).   OXIMETRY: Oxyhemoglobin Saturation Nadir during sleep was at  78%,  from a mean saturation of 93%.  Of the Total sleep time (TST) hypoxemia (<89%) was present for 3.4 minutes, or 1.5% of total sleep time. Supine sleep and REM sleep let to more frequent, brief oxygen  desaturations. Before the patient entered sleep, there was a long hypoxic period recorded.  LIMB MOVEMENTS: There were 4 periodic limb movements of sleep (1.1/h), of which 0 (0.0/h) were associated with an arousal. AROUSAL: There were 114 arousals in total, for an arousal index of 30 arousals/hour.  Of these, 53 were identified as respiratory-related arousals (14 /h), 0 were PLM-related arousals (0 /h), and 80 were non-specific arousals (21 /h). EEG:  PSG EEG was of normal amplitude and frequency, with symmetric manifestation of sleep stages. EKG: The electrocardiogram documented NSR.  The average heart rate during sleep was 68 bpm.  The heart rate during sleep varied between a minimum of 55 and  a maximum of  84 bpm. AUDIO and VIDEO: The patient slept initially with an elevated head of bed, and once the bed was lowered, his AHI exacerbated. Orthopnoea. No vocalizations or phonations not complex movements of sleep were captured.   IMPRESSION: 1) Sleep disordered breathing was present but delayed sleep onset made SPLIT protocol impossible to implement.  This is still considered mild Obstructive Sleep apnea at an AHI of 17/h but the severity is clearly associated with supine sleep exacerbation and hypoxia/ orthopnoea.  RECOMMENDATIONS: Even if CPAP therapy is not tolerated, this patient is not a candidate for inspire device but for 1) Positional therapy (avoiding supine sleep) and sleeping with the head of bed elevated.   I strongly recommend that [name] speaks to his pulmonologist about oxygen supplementation at night. Here at [facility], we can only prescribe oxygen if a patient had documented desaturation under CPAP therapy. I believe that he could benefit from nasal oxygen at night.     Larey Seat, MD            General Information  Name: Joe, Green BMI: 26.98 Physician: ,   ID: [id number] Height: 70.0 in Technician: Richard Miu  Sex: Male Weight: 188.0 lb Record:  [id number]  Age: 71 [08/18/1950] Date: 07/02/2022 Scorer: [place] & Medication History    Joe Green is a 71 y.o. Caucasian male patient seen here as a referral on 04/11/2022 from PCP for a . Chief concern according to patient : Last PSG in lab 2014 , was started on CPAP. He used the machine but wasn't as compliant. He has not been using the CPAP but has OSA and COPD , DM2, had 2005 dx CAD with stents. I liked CPAP but it kept me from sleeping on my side- sleep study was done at [place] [facility]- he doesn't recall any follow up. He is interested in inspire. I have the pleasure of seeing LERRY CORDREY today, a right -handed Caucasian male with a history of OSA and COPD, sleep disorder. He has a past medical history of CAD (coronary artery disease), GERD (gastroesophageal reflux disease), HLD (hyperlipidemia), Hypertension, Obesity, OSA (obstructive sleep apnea), Type II or unspecified type diabetes mellitus without mention of complication, not stated as uncontrolled, Vitamin D deficiency, and Wears glasses.  Tylenol, Aspirin, Vitamin D, Cinnamon, Cardura, [name], Allegra,  Proscar, Breo Ellipta, Amaryl, Glucophage, Nicotine, Nitrostat, Altace, Requip, Crestor, Chantix, Mobic, Robaxin   Sleep Disorder      Comments   The patient came into the lab for a split night study. The patient was not split due to not having enough sleep time before it got too late in the night. The patient had a prior sleep study in 2014 with another sleep lab. He took Requip prior to start of study. The patient had two restroom breaks. Took the patient a while to initiate sleep. EKG appeared irregular. Moderate snoring. All sleep stages witnessed. Respiratory events scored with a 4% desat. Slept lateral and supine. The patient started having more respiratory events while supine after the elevation of the bed was lowered. Majority of respiratory events while supine.    Lights out: 10:10:03 PM  Lights on: 05:24:41 AM   Time Total Supine Side Prone Upright  Recording (TRT) 7h 15.81m2h 39.578mh 35.43m27m 0.66m 29m0.66m  36mep (TST) 3h 47.66m 1h866m.43m 2h 543m43m 0h 027m 0h 0.31m  Late53m N1 N2 N3 REM Onset Per. Slp. Eff.  Actual 2h 29.66m 2h 35.562mh 56.43m56m 8.66m 218m9.66m 311m0.43m 5238m%   [id number] Wake N1 N2 N3 REM  Total 58.0 25.5 121.5 28.5 51.5  Supine 28.0 12.5 56.0 0.0 5.0  Side 30.0 13.0 65.5 28.5 46.5  Prone 0.0 0.0 0.0 0.0 0.0  Upright 0.0 0.0 0.0 0.0 0.0   Stg % Wake N1 N2 N3 REM  Total 20.4 11.2 53.5 12.6 22.7  Supine 9.8 5.5 24.7 0.0 2.2  Side 10.5 5.7 28.9 12.6 20.5  Prone 0.0 0.0 0.0 0.0 0.0  Upright 0.0 0.0 0.0 0.0 0.0     Apnea Summary Sub Supine Side Prone Upright  Total 22 Total 22 22 0 0 0    REM 4 4 0 0 0    NREM 18 18 0 0 0  Obs 22 REM 4 4 0 0 0    NREM 18 18 0 0 0  Mix 0 REM 0 0 0 0 0    NREM 0 0 0 0 0  Cen 0 REM 0 0 0 0 0    NREM 0 0 0 0 0   Rera Summary Sub Supine Side Prone Upright  Total 0 Total 0 0 0 0 0    REM 0 0 0 0 0    NREM 0 0 0 0 0   Hypopnea Summary Sub Supine Side Prone Upright  Total 45 Total 45 44 1 0 0    REM 2 2 0 0 0    NREM 43 42 1 0 0   4% Hypopnea Summary Sub Supine Side Prone Upright  Total (4%) 45 Total 45 44 1 0 0    REM 2 2 0 0 0    NREM 43 42 1 0 0     AHI Total Obs Mix Cen  17.71 Apnea 5.81 5.81 0.00 0.00   Hypopnea 11.89 -- -- --  17.71 Hypopnea (4%) 11.89 -- -- --    Total Supine Side Prone Upright  Position AHI 17.71 53.88 0.39 0.00 0.00  REM AHI 6.99   NREM AHI 20.85   Position RDI 17.71 53.88 0.39 0.00 0.00  REM RDI 6.99   NREM RDI 20.85    4% Hypopnea Total Supine Side Prone Upright  Position AHI (4%) 17.71 53.88 0.39 0.00 0.00  REM AHI (4%) 6.99   NREM AHI (4%) 20.85  Position RDI (4%) 17.71 53.88 0.39 0.00 0.00  REM RDI (4%) 6.99   NREM RDI (4%) 20.85    Desaturation Information Threshold: 2% <100% <90% <80% <70% <60% <50% <40%  Supine 104.0 51.0 3.0 0.0 0.0 0.0 0.0  Side 18.0 3.0 0.0  0.0 0.0 0.0 0.0  Prone 0.0 0.0 0.0 0.0 0.0 0.0 0.0  Upright 0.0 0.0 0.0 0.0 0.0 0.0 0.0  Total 122.0 54.0 3.0 0.0 0.0 0.0 0.0  Index 25.7 11.4 0.6 0.0 0.0 0.0 0.0   Threshold: 3% <100% <90% <80% <70% <60% <50% <40%  Supine 79.0 50.0 3.0 0.0 0.0 0.0 0.0  Side 5.0 3.0 0.0 0.0 0.0 0.0 0.0  Prone 0.0 0.0 0.0 0.0 0.0 0.0 0.0  Upright 0.0 0.0 0.0 0.0 0.0 0.0 0.0  Total 84.0 53.0 3.0 0.0 0.0 0.0 0.0  Index 17.7 11.2 0.6 0.0 0.0 0.0 0.0   Threshold: 4% <100% <90% <80% <70% <60% <50% <40%  Supine 79.0 50.0 3.0 0.0 0.0 0.0 0.0  Side 5.0 3.0 0.0 0.0 0.0 0.0 0.0  Prone 0.0 0.0 0.0 0.0 0.0 0.0 0.0  Upright 0.0 0.0 0.0 0.0 0.0 0.0 0.0  Total 84.0 53.0 3.0 0.0 0.0 0.0 0.0  Index 17.7 11.2 0.6 0.0 0.0 0.0 0.0   Threshold: 4% <100% <90% <80% <70% <60% <50% <40%  Supine 79 50 3 0 0 0 0  Side 5 3 0 0 0 0 0  Prone 0 0 0 0 0 0 0  Upright 0 0 0 0 0 0 0  Total 84 53 3 0 0 0 0   Awakening/Arousal Information # of Awakenings 35  Wake after sleep onset 58.17m Wake after persistent sleep 50.043m Arousal Assoc. Arousals Index  Apneas 19 5.0  Hypopneas 34 9.0  Leg Movements 1 0.3  Snore 0 0.0  PTT Arousals 0 0.0  Spontaneous 80 21.1  Total 134 35.4  Leg Movement Information PLMS LMs Index  Total LMs during PLMS 4 1.1  LMs w/ Microarousals 0 0.0   LM LMs Index  w/ Microarousal 1 0.3  w/ Awakening 0 0.0  w/ Resp Event 0 0.0  Spontaneous 7 1.9  Total 8 2.1     Desaturation threshold setting: 4% Minimum desaturation setting: 10 seconds SaO2 nadir: 73% The longest event was a 53 sec obstructive Hypopnea with a minimum SaO2 of 88%. The lowest SaO2 was 78% associated with a 28 sec obstructive Apnea. EKG Rates EKG Avg Max Min  Awake 72 93 55  Asleep 68 84 55  EKG Events: N/A

## 2022-07-10 ENCOUNTER — Telehealth: Payer: Self-pay

## 2022-07-10 NOTE — Telephone Encounter (Signed)
-----   Message from Larey Seat, MD sent at 07/08/2022  4:04 PM EST ----- Patient has a pulmonology appointment on 07-19-2022. Please address possible nocturnal oxygen as I wrote in my recommendations.  He is not a candidate for Inspire, his AHI is too mild and his apnea is positional, his hypoxemia would not be addressed with inspire. He should not sleep on a flat but on an elevated head of bed and not in supine.

## 2022-07-10 NOTE — Telephone Encounter (Signed)
I called patient.  I advised him of his sleep study results and recommendations.  He will follow-up with his pulmonologist on December 5.  He is aware he may need nocturnal oxygen.  He reports that he sleeps in a recliner.  He keeps his head elevated while sleeping.  He will avoid supine sleep.  He will follow-up with [place] as needed.  Patient verbalized understanding of results and recommendations.  Patient had no questions or concerns at this time.

## 2022-07-13 DIAGNOSIS — I2510 Atherosclerotic heart disease of native coronary artery without angina pectoris: Secondary | ICD-10-CM | POA: Diagnosis not present

## 2022-07-13 DIAGNOSIS — E785 Hyperlipidemia, unspecified: Secondary | ICD-10-CM | POA: Diagnosis not present

## 2022-07-13 DIAGNOSIS — E1169 Type 2 diabetes mellitus with other specified complication: Secondary | ICD-10-CM | POA: Diagnosis not present

## 2022-07-13 DIAGNOSIS — I10 Essential (primary) hypertension: Secondary | ICD-10-CM | POA: Diagnosis not present

## 2022-07-18 ENCOUNTER — Encounter: Payer: Self-pay | Admitting: Pulmonary Disease

## 2022-07-18 ENCOUNTER — Ambulatory Visit (INDEPENDENT_AMBULATORY_CARE_PROVIDER_SITE_OTHER): Payer: Medicare Other | Admitting: Pulmonary Disease

## 2022-07-18 ENCOUNTER — Ambulatory Visit: Payer: Medicare Other | Admitting: Pulmonary Disease

## 2022-07-18 VITALS — BP 130/60 | HR 67 | Temp 97.8°F | Ht 69.5 in | Wt 189.0 lb

## 2022-07-18 DIAGNOSIS — J449 Chronic obstructive pulmonary disease, unspecified: Secondary | ICD-10-CM

## 2022-07-18 DIAGNOSIS — F17200 Nicotine dependence, unspecified, uncomplicated: Secondary | ICD-10-CM | POA: Diagnosis not present

## 2022-07-18 LAB — PULMONARY FUNCTION TEST
DL/VA % pred: 84 %
DL/VA: 3.42 ml/min/mmHg/L
DLCO cor % pred: 83 %
DLCO cor: 21.44 ml/min/mmHg
DLCO unc % pred: 83 %
DLCO unc: 21.44 ml/min/mmHg
FEF 25-75 Post: 2.08 L/sec
FEF 25-75 Pre: 1.81 L/sec
FEF2575-%Change-Post: 14 %
FEF2575-%Pred-Post: 85 %
FEF2575-%Pred-Pre: 74 %
FEV1-%Change-Post: 1 %
FEV1-%Pred-Post: 91 %
FEV1-%Pred-Pre: 89 %
FEV1-Post: 2.92 L
FEV1-Pre: 2.87 L
FEV1FVC-%Change-Post: 0 %
FEV1FVC-%Pred-Pre: 96 %
FEV6-%Change-Post: 3 %
FEV6-%Pred-Post: 100 %
FEV6-%Pred-Pre: 97 %
FEV6-Post: 4.11 L
FEV6-Pre: 3.98 L
FEV6FVC-%Change-Post: 0 %
FEV6FVC-%Pred-Post: 104 %
FEV6FVC-%Pred-Pre: 104 %
FVC-%Change-Post: 2 %
FVC-%Pred-Post: 95 %
FVC-%Pred-Pre: 93 %
FVC-Post: 4.16 L
FVC-Pre: 4.05 L
Post FEV1/FVC ratio: 70 %
Post FEV6/FVC ratio: 99 %
Pre FEV1/FVC ratio: 71 %
Pre FEV6/FVC Ratio: 98 %
RV % pred: 112 %
RV: 2.73 L
TLC % pred: 97 %
TLC: 6.77 L

## 2022-07-18 NOTE — Progress Notes (Signed)
PFT done today. 

## 2022-07-18 NOTE — Patient Instructions (Signed)
I am glad you are stable with your breathing Your CT and PFTs are stable which is good news Will continue to monitor Follow-up in 6 months

## 2022-07-18 NOTE — Progress Notes (Signed)
Joe Green    [id number]    03-13-1951  Primary Care Physician:McKeown, Gwyndolyn Saxon, MD  Referring Physician: [name], MD [address] [place],  [place] [address]  Chief complaint: Follow-up for abnormal CT, ILD evaluation  HPI: 71 y.o.  with history of diabetes, sleep apnea referred for evaluation of abnormal CT with concern for ILD He has chronic cough with mild dyspnea on exertion.  Discussed at ILD conference 05/25/11.  CT findings are felt to be alternate pattern due to predominant nature of groundglass opacities over fibrotic changes and no clear honeycombing.  Changes at the lung base felt to be more consistent with bullous emphysema than honeycombing  Referral was made to cardiothoracic surgery for lung biopsy but after meeting with Dr. Roxan Hockey he changed mind and decided against lung biopsy.  He wants to continue monitoring  Pets: No pets Occupation: Retired Chief of Staff ILD questionnaire for exposures 04/11/21-incompletely filled.  Denies any exposures Smoking history: 40-pack-year smoker.  Continues to smoke half pack per day Travel history: No significant travel history Relevant family history: No family history of lung disease  Interim history: States that breathing is stable He is here for review of CT and PFTs  He had a sleep study done at [facility] which showed mild positional sleep apnea, and nocturnal hypoxemia and was advised to follow-up with pulmonary  Outpatient Encounter Medications as of 07/18/2022  Medication Sig   acetaminophen (TYLENOL) 500 MG tablet Take 2 tablets (1,000 mg total) by mouth every 6 (six) hours.   albuterol (VENTOLIN HFA) 108 (90 Base) MCG/ACT inhaler Inhale 2 puffs into the lungs every 6 (six) hours as needed for wheezing or shortness of breath.   aspirin EC 81 MG tablet Take 1 tablet (81 mg total) by mouth daily.   blood glucose meter kit and supplies Dispense based on  patient and insurance preference. Use up to four times daily as directed. (FOR ICD-9 250.00, 250.01).   Cholecalciferol (VITAMIN D) 50 MCG (2000 UT) tablet Take 1 tablet (2,000 Units total) by mouth daily. (Patient taking differently: Take 10,000 Units by mouth daily.)   Cinnamon 500 MG TABS Take 1 tablet by mouth in the morning and at bedtime.   doxazosin (CARDURA) 8 MG tablet Take  1 tablet  at Bedtime  for BP & Prostate   Dulaglutide ([name]) 3 XA/1.2IN SOPN Inject 3 mg as directed once a week.   fexofenadine (ALLEGRA) 60 MG tablet Take 60 mg by mouth daily as needed for allergies or rhinitis.   finasteride (PROSCAR) 5 MG tablet TAKE 1 TABLET BY MOUTH ONCE DAILY FOR PROSTATE   fluticasone furoate-vilanterol (BREO ELLIPTA) 100-25 MCG/ACT AEPB Use 1 inhalation Daily for COPD / Asthma   glimepiride (AMARYL) 4 MG tablet TAKE 1/2-1 TABLET BY MOUTH daily with supper for diabetes   glucose blood test strip Test blood sugars once daily.Dx: E11.9   hydrOXYzine (ATARAX) 25 MG tablet Take 2 tablets (50 mg total) by mouth 3 (three) times daily as needed for itching.   meloxicam (MOBIC) 15 MG tablet Take 15 mg by mouth daily as needed.   metFORMIN (GLUCOPHAGE) 1000 MG tablet Take  1 tablet   2 x /day  with Meals for Diabetes                                                  /  TAKE                              BY                           MOUTH   Nicotine 21-14-7 MG/24HR KIT Place 1 patch onto the skin daily in the afternoon.   nitroGLYCERIN (NITROSTAT) 0.4 MG SL tablet Place 1 tablet (0.4 mg total) under the tongue every 5 (five) minutes as needed for chest pain.   pramipexole (MIRAPEX) 0.5 MG tablet Take 1 tablet (0.5 mg total) by mouth at bedtime.   ramipril (ALTACE) 5 MG capsule TAKE 1 CAPSULE BY MOUTH ONCE DAILY FOR BLOOD PRESSURE AND  DIABETIC  KIDNEY  PROTECTION   rosuvastatin (CRESTOR) 20 MG tablet Take 1 tab three nights a week for cholesterol.   varenicline (CHANTIX  CONTINUING MONTH PAK) 1 MG tablet Take 1 tablet (1 mg total) by mouth 2 (two) times daily.   No facility-administered encounter medications on file as of 07/18/2022.    Physical Exam: Blood pressure 130/60, pulse 67, temperature 97.8 F (36.6 C), temperature source Oral, height 5' 9.5" (1.765 m), weight 189 lb (85.7 kg), SpO2 96 %. Gen:      No acute distress HEENT:  EOMI, sclera anicteric Neck:     No masses; no thyromegaly Lungs:    Clear to auscultation bilaterally; normal respiratory effort CV:         Regular rate and rhythm; no murmurs Abd:      + bowel sounds; soft, non-tender; no palpable masses, no distension Ext:    No edema; adequate peripheral perfusion Skin:      Warm and dry; no rash Neuro: alert and oriented x 3 Psych: normal mood and affect   Data Reviewed: Imaging: Screening CT chest 12/15/2016-centrilobular and paraseptal emphysema.  Minimal subpleural reticulation. Screening CT chest 02/18/2021-centrilobular and paraseptal emphysema, bibasal subpleural reticulation and groundglass with honeycombing.  High-resolution CT 05/04/2021-peripheral and basilar predominant groundglass with minimal subpleural reticulation, alternate diagnosis. High resolution CT 05/25/2022-stable interstitial lung disease  I have reviewed the images personally.  PFTs: 05/20/2021 FVC 4.24 [96%], FEV1 3.05 [94%], F/F 72, TLC 6.77 [97%], DLCO 19.86 [77%]  07/18/22 FVC 4.16 [95%], FEV1 2.92 [91%], F/F90, TLC 6.77 [97%], DLCO 21.44 [83%] Normal test  Labs: CTD serologies 03/22/21-significant only for rheumatoid factor of 18  Sleep: PSG 07/02/2022 Sleep disordered breathing was present but delayed sleep onset made SPLIT protocol impossible to implement.  This is still considered mild Obstructive Sleep apnea at an AHI of 17/h but the severity is clearly associated with supine sleep exacerbation and hypoxia/ orthopnoea.  Assessment:  Interstitial lung disease, pulmonary fibrosis CT scan reviewed  at ILD conference and findings is consistent with alternate diagnosis as there is predominant groundglass with no honeycombing.  See discussion above  We discussed options including biopsy with either bronchoscopy with ENVISIA or surgical lung biopsy, watchful waiting or empiric treatment with antifibrotics.  Since his breathing is doing well he would like to avoid any intervention at present and prefers conservative management with monitoring  Follow-up high-res CT and gave these are stable.  Will continue monitoring  Active smoker Discussed smoking cessation.  Start Chantix and nicotine patches Time spent counseling-5 minutes.  Reassess at return visit  OSA.   Reviewed sleep study from November 2023 and recommendations from Dr. Brett Fairy. He has been advised positional therapy  and sleeping with the head of the bed elevated We discussed use of nocturnal supplemental oxygen in office today but he is not interested in trying any therapies including CPAP or supplemental oxygen  Plan/Recommendations: Conservative management Chantix and nicotine patches for smoking cessation  Marshell Garfinkel MD [facility] 07/18/2022, 1:37 PM  CC: [name], MD

## 2022-07-20 ENCOUNTER — Encounter: Payer: Self-pay | Admitting: Internal Medicine

## 2022-07-23 DIAGNOSIS — J00 Acute nasopharyngitis [common cold]: Secondary | ICD-10-CM | POA: Diagnosis not present

## 2022-07-23 DIAGNOSIS — R051 Acute cough: Secondary | ICD-10-CM | POA: Diagnosis not present

## 2022-07-26 ENCOUNTER — Encounter: Payer: Self-pay | Admitting: Internal Medicine

## 2022-07-27 ENCOUNTER — Telehealth: Payer: Self-pay | Admitting: Internal Medicine

## 2022-07-27 NOTE — Chronic Care Management (AMB) (Signed)
Patient Assistance   Called and Spoke with patient to get information about a decision. He states that he never received a letter of approval or denial but he received a shipment last week of [name] but it was for 1.62m nd not 341m On his application I put [id number] in directions put [id number]. Not sure why he was sent 1.5mg24m Called [facility] to see if I can get information. Spoke with representative and he stated that the last application that they received was in August of this year and the prescription that was sent was for the 1.5mg.25mich explains why he received that dosage in  the mail. The application that was done in November was never sent out by office.   Called and spoke with patient and made him aware of this and advised him that I would call back once I had more information. He verbalized understanding.   EricaHildred Alamin [address]

## 2022-08-24 ENCOUNTER — Other Ambulatory Visit: Payer: Self-pay | Admitting: Nurse Practitioner

## 2022-08-31 ENCOUNTER — Ambulatory Visit (INDEPENDENT_AMBULATORY_CARE_PROVIDER_SITE_OTHER): Payer: Medicare HMO | Admitting: Nurse Practitioner

## 2022-08-31 ENCOUNTER — Ambulatory Visit
Admission: RE | Admit: 2022-08-31 | Discharge: 2022-08-31 | Disposition: A | Discharge status: Home / Self Care | Payer: Medicare HMO | Source: Ambulatory Visit | Attending: Nurse Practitioner | Admitting: Nurse Practitioner

## 2022-08-31 ENCOUNTER — Encounter: Payer: Self-pay | Admitting: Nurse Practitioner

## 2022-08-31 VITALS — BP 128/58 | HR 71 | Temp 98.1°F | Ht 69.0 in | Wt 195.0 lb

## 2022-08-31 DIAGNOSIS — Z87891 Personal history of nicotine dependence: Secondary | ICD-10-CM

## 2022-08-31 DIAGNOSIS — I10 Essential (primary) hypertension: Secondary | ICD-10-CM | POA: Diagnosis not present

## 2022-08-31 DIAGNOSIS — M7989 Other specified soft tissue disorders: Secondary | ICD-10-CM | POA: Diagnosis not present

## 2022-08-31 NOTE — Patient Instructions (Signed)

## 2022-08-31 NOTE — Progress Notes (Addendum)
Assessment and Plan:  Joe Green was seen today for acute visit.  Diagnoses and all orders for this visit:  Essential hypertension - continue medications, DASH diet, exercise and monitor at home. Call if greater than 130/80.   Swollen thumb Offered use of steroid taper but patient declines -     Uric acid  Swelling of right hand Can alternate ice/heat X ray ordered and will treat pending results -     DG Hand Complete Right; Future    Former Smoker Pt has stopped smoking Continue to monitor   Further disposition pending results of labs. Discussed med's effects and SE's.   Over 30 minutes of exam, counseling, chart review, and critical decision making was performed.   Future Appointments  Date Time Provider [place]  10/03/2022 10:30 AM Alycia Rossetti, NP [facility] None  11/09/2022 11:00 AM Josue Hector, MD [facility]  11/30/2022 10:00 AM Alycia Rossetti, NP [facility] None  07/03/2023 10:00 AM Alycia Rossetti, NP [facility] None    ------------------------------------------------------------------------------------------------------------------   HPI BP (!) 128/58   Pulse 71   Temp 98.1 F (36.7 C)   Ht '5\' 9"'$  (1.753 m)   Wt 195 lb (88.5 kg)   SpO2 94%   BMI 28.80 kg/m    72 y.o.male presents for pain and swelling in right , most of the pain is on the thumb area.  Pain began yesterday.  He denies any injury to the area.  BP is currently controlled with ramipril 5 mg QD and cardura 8 mg QD.  Denies headaches, chest pain, shortness of breath and dizziness.  BP Readings from Last 3 Encounters:  08/31/22 (!) 128/58  07/18/22 130/60  06/29/22 120/62   Pt has stopped smoking!  Past Medical History:  Diagnosis Date   CAD (coronary artery disease)    a. s/p stent to RCA 2005; b. LHC 2006 after Nuc with inf ischemia: patent RCA stent, D1 80%, LAD 60% => med Rx;  c. LHC 04/18/11: Proximal LAD 60% (unchanged from previous catheterization),  distal LAD 65% (small caliber), Dx 60%, proximal CFX 20%, mid CFX 50%, OM1 30%, proximal RCA 20%, mid RCA stent patent, EF 65%.=> med Rx   GERD (gastroesophageal reflux disease)    HLD (hyperlipidemia)    Hypertension    Obesity    OSA (obstructive sleep apnea)    has a cpap-does not use-has a special mouthpiece he wears at night   Type II or unspecified type diabetes mellitus without mention of complication, not stated as uncontrolled    Vitamin D deficiency    Wears glasses      No Known Allergies  Current Outpatient Medications on File Prior to Visit  Medication Sig   acetaminophen (TYLENOL) 500 MG tablet Take 2 tablets (1,000 mg total) by mouth every 6 (six) hours.   albuterol (VENTOLIN HFA) 108 (90 Base) MCG/ACT inhaler Inhale 2 puffs into the lungs every 6 (six) hours as needed for wheezing or shortness of breath.   aspirin EC 81 MG tablet Take 1 tablet (81 mg total) by mouth daily.   blood glucose meter kit and supplies Dispense based on patient and insurance preference. Use up to four times daily as directed. (FOR ICD-9 250.00, 250.01).   Cholecalciferol (VITAMIN D) 50 MCG (2000 UT) tablet Take 1 tablet (2,000 Units total) by mouth daily. (Patient taking differently: Take 10,000 Units by mouth daily.)   Cinnamon 500 MG TABS Take 1 tablet by mouth in the morning and at  bedtime.   doxazosin (CARDURA) 8 MG tablet Take  1 tablet  at Bedtime  for BP & Prostate   Dulaglutide ([name]) 3 HW/8.6HU SOPN Inject 3 mg as directed once a week.   fexofenadine (ALLEGRA) 60 MG tablet Take 60 mg by mouth daily as needed for allergies or rhinitis.   finasteride (PROSCAR) 5 MG tablet TAKE 1 TABLET BY MOUTH ONCE DAILY FOR PROSTATE   fluticasone furoate-vilanterol (BREO ELLIPTA) 100-25 MCG/ACT AEPB Use 1 inhalation Daily for COPD / Asthma   glimepiride (AMARYL) 4 MG tablet TAKE 1/2-1 TABLET BY MOUTH daily with supper for diabetes   glucose blood test strip Test blood sugars once daily.Dx: E11.9    meloxicam (MOBIC) 15 MG tablet Take 15 mg by mouth daily as needed.   metFORMIN (GLUCOPHAGE) 1000 MG tablet Take  1 tablet   2 x /day  with Meals for Diabetes                                                  /                            TAKE                              BY                           MOUTH   nitroGLYCERIN (NITROSTAT) 0.4 MG SL tablet Place 1 tablet (0.4 mg total) under the tongue every 5 (five) minutes as needed for chest pain.   ramipril (ALTACE) 5 MG capsule TAKE 1 CAPSULE BY MOUTH ONCE DAILY FOR  BLOOD  PRESSURE  AND  DIABETIC  KIDNEY  PROTECTION   rosuvastatin (CRESTOR) 20 MG tablet Take 1 tab three nights a week for cholesterol.   hydrOXYzine (ATARAX) 25 MG tablet Take 2 tablets (50 mg total) by mouth 3 (three) times daily as needed for itching. (Patient not taking: Reported on 08/31/2022)   Nicotine 21-14-7 MG/24HR KIT Place 1 patch onto the skin daily in the afternoon. (Patient not taking: Reported on 08/31/2022)   pramipexole (MIRAPEX) 0.5 MG tablet Take 1 tablet (0.5 mg total) by mouth at bedtime.   varenicline (CHANTIX CONTINUING MONTH PAK) 1 MG tablet Take 1 tablet (1 mg total) by mouth 2 (two) times daily. (Patient not taking: Reported on 08/31/2022)   No current facility-administered medications on file prior to visit.    ROS: all negative except above.   Physical Exam:  BP (!) 128/58   Pulse 71   Temp 98.1 F (36.7 C)   Ht '5\' 9"'$  (1.753 m)   Wt 195 lb (88.5 kg)   SpO2 94%   BMI 28.80 kg/m   General Appearance: Well nourished, in no apparent distress. Eyes: PERRLA, EOMs, conjunctiva no swelling or erythema Respiratory: Respiratory effort normal, BS equal bilaterally without rales, rhonchi, wheezing or stridor.  Cardio: RRR with no MRGs. Brisk peripheral pulses without edema.  Abdomen: Soft, + BS.  Non tender, no guarding, rebound, hernias, masses. Lymphatics: Non tender without lymphadenopathy.  Musculoskeletal: Full ROM, 5/5 strength, normal gait. Right hand  swollen, with mild tenderness of base of thumb, no redness or warmth Skin: Warm, dry .  Scabbed area on top of right hand, no erythema. Neuro: Cranial nerves intact. Normal muscle tone, no cerebellar symptoms. Sensation intact.  Psych: Awake and oriented X 3, normal affect, Insight and Judgment appropriate.     Alycia Rossetti, NP 9:07 AM [facility]

## 2022-09-01 LAB — URIC ACID: Uric Acid, Serum: 3.4 mg/dL — ABNORMAL LOW (ref 4.0–8.0)

## 2022-09-13 DIAGNOSIS — H2513 Age-related nuclear cataract, bilateral: Secondary | ICD-10-CM | POA: Diagnosis not present

## 2022-09-13 DIAGNOSIS — H35362 Drusen (degenerative) of macula, left eye: Secondary | ICD-10-CM | POA: Diagnosis not present

## 2022-09-13 DIAGNOSIS — E119 Type 2 diabetes mellitus without complications: Secondary | ICD-10-CM | POA: Diagnosis not present

## 2022-09-13 DIAGNOSIS — H524 Presbyopia: Secondary | ICD-10-CM | POA: Diagnosis not present

## 2022-09-13 LAB — HM DIABETES EYE EXAM

## 2022-09-17 ENCOUNTER — Other Ambulatory Visit: Payer: Self-pay | Admitting: Nurse Practitioner

## 2022-09-17 DIAGNOSIS — N401 Enlarged prostate with lower urinary tract symptoms: Secondary | ICD-10-CM

## 2022-09-26 ENCOUNTER — Other Ambulatory Visit: Payer: Self-pay | Admitting: Nurse Practitioner

## 2022-09-26 DIAGNOSIS — G2581 Restless legs syndrome: Secondary | ICD-10-CM

## 2022-09-27 ENCOUNTER — Encounter: Payer: Self-pay | Admitting: Internal Medicine

## 2022-10-02 NOTE — Progress Notes (Unsigned)
FOLLOW UP  Assessment and Plan:   Aortic atherosclerosis ([place]) Per CT 2020 Control blood pressure, cholesterol, glucose, increase exercise.   Coronary artery disease involving native heart with angina pectoris, unspecified vessel or lesion type ([place]) Control blood pressure, cholesterol, glucose, increase exercise.  Continue cardio follow up Stop smoking  OSA and COPD overlap syndrome ([facility]) Wears mouthpiece, encouraged compliance, risks discussed, follow up by Dr. Toy Cookey if needed   Chronic obstructive pulmonary disease, unspecified COPD type ([place]) Continue inhalers, stop smoking, has recent CT lung 02/2020  Hypertension Well controlled with current medications  Monitor blood pressure at home; patient to call if consistently greater than 130/80 Continue DASH diet.   Reminder to go to the ER if any CP, SOB, nausea, dizziness, severe HA, changes vision/speech, left arm numbness and tingling and jaw pain.  Cholesterol Currently at goal; continue statin, discussed LDL goal <70 Continue low cholesterol diet and exercise.  Check lipid panel.   Diabetes with other circulatory complications Continue medication: metformin, glimepiride 2 mg BID; farxiga was discontinued due to mycotic infections He would prefer to avoid insulin; did well with ozempic but cost concern - will try trulicity with lily support  Perform daily foot/skin check, notify office of any concerning changes.  Check A1C  Overweight with co morbidities  Long discussion about weight loss, diet, and exercise Recommended diet heavy in fruits and veggies and low in animal meats, cheeses, and dairy products, appropriate calorie intake Discussed ideal weight for height and initial weight goal  Patient will work on increasing intentional exercise  Will follow up in 3 months  Vitamin D Def Continue supplement for goal 60-100;  Check Vit D level  Tobacco use Discussed risks associated with tobacco use and advised to  reduce or quit Patient is not ready to do so, but advised to consider strongly Will follow up at the next visit - CT lung ordered to schedule    Continue diet and meds as discussed. Further disposition pending results of labs. Discussed med's effects and SE's.   Over 30 minutes of exam, counseling, chart review, and critical decision making was performed.   Future Appointments  Date Time Provider [place]  10/03/2022 10:30 AM Alycia Rossetti, NP [facility] None  11/09/2022 11:00 AM Josue Hector, MD [facility]  11/30/2022 10:00 AM Alycia Rossetti, NP [facility] None  07/03/2023 10:00 AM Alycia Rossetti, NP [facility] None    ----------------------------------------------------------------------------------------------------------------------  HPI 72 y.o. male  presents for 3 month follow up on CAD, hypertension, cholesterol, diabetes, CKD, smoking, weight and vitamin D deficiency.   He has hx of reported left arm pain and was seen by Dr. Johnsie Cancel with normal Lexiscan in 07/2019, reports had cortisone shot by Dr. Erlinda Hong last week and doing better.   He is a most day smoker, down to 0-2 cigarettes some days, down from 1 pack/day for 40 years and consequently with COPD; also OSA with CPAP mouth piece followed by Dr. Toy Cookey but admits he is not wearing daily citing some intermittent discomfort; he is prescribed BREO and has expressed significant benefit with this. He is getting annual CT lung screening, no nodules, last 02/2020.    He has also admitted to excess alcohol intake, 3-4 drinks nightly and has been advised to reduce this intake, down from 3-6.   OSA, intolerant of CPAP and had been using mouth piece but he feels didn't work well; managed by Dr. Toy Cookey, does still use, has been recommended repeat sleep study  but patient adamantly declines repeat. He plans to try again with mask, still has CPAP.   BMI is There is no height or weight on file to calculate BMI.,  he has been working on diet, he is active at work, as a Water engineer.  Wt Readings from Last 3 Encounters:  08/31/22 195 lb (88.5 kg)  07/18/22 189 lb (85.7 kg)  06/29/22 186 lb (84.4 kg)   He has a history of CAD s/p PCI to RCA in 2005; He underwent LHC in 2006 after nuc showed inf ischemia; He underwent LHC in 04/18/11 which showed a proximal LAD 60% (unchanged from previous catheterization), distal LAD 65% (small caliber), Dx 60%, proximal CFX 20%, mid CFX 50%, OM1 30%, proximal RCA 20%, mid RCA stent patent, EF 65%. Medical therapy recommended. Follows with cardiology - Dr. Johnsie Cancel and colleagues. Had recent repeat lexiscan in 07/2019 which was normal with EF 65%. He has aortic atherosclerosis per numerous CT scans.   His blood pressure has been controlled at home, today their BP is    He does not workout. He denies chest pain, shortness of breath, dizziness.    He is on cholesterol medication (rosuvastatin 20 mg three days a week) and denies myalgias. His cholesterol is at goal. The cholesterol last visit was:   Lab Results  Component Value Date   CHOL 113 06/29/2022   HDL 45 06/29/2022   LDLCALC 52 06/29/2022   TRIG 80 06/29/2022   CHOLHDL 2.5 06/29/2022    He has been working on diet and exercise for T2DM (on metformin 2000 mg, glimepiride 4 mg BID, formerly on farxiga but stopped due to yeast infections, off of ozempic due to cost, interested in trulicy), and denies foot ulcerations, increased appetite, nausea, paresthesia of the feet, polydipsia, polyuria, visual disturbances, vomiting and weight loss. He does check sporadic fasting glucose, was 130s on ozempic, now around 150-180 off of ozempic. Last A1C in the office was:  Lab Results  Component Value Date   HGBA1C 7.5 (H) 06/29/2022   He has CKD II associated with T2DM monitored at this office:  Lab Results  Component Value Date   GFRNONAA 97 10/28/2020   Patient is on Vitamin D supplement.   Lab Results  Component Value  Date   VD25OH 72 06/29/2022         Current Medications:  Current Outpatient Medications on File Prior to Visit  Medication Sig   acetaminophen (TYLENOL) 500 MG tablet Take 2 tablets (1,000 mg total) by mouth every 6 (six) hours.   albuterol (VENTOLIN HFA) 108 (90 Base) MCG/ACT inhaler Inhale 2 puffs into the lungs every 6 (six) hours as needed for wheezing or shortness of breath.   aspirin EC 81 MG tablet Take 1 tablet (81 mg total) by mouth daily.   blood glucose meter kit and supplies Dispense based on patient and insurance preference. Use up to four times daily as directed. (FOR ICD-9 250.00, 250.01).   Cholecalciferol (VITAMIN D) 50 MCG (2000 UT) tablet Take 1 tablet (2,000 Units total) by mouth daily. (Patient taking differently: Take 10,000 Units by mouth daily.)   Cinnamon 500 MG TABS Take 1 tablet by mouth in the morning and at bedtime.   doxazosin (CARDURA) 8 MG tablet Take  1 tablet  at Bedtime  for BP & Prostate   Dulaglutide ([name]) 3 0000000 SOPN Inject 3 mg as directed once a week.   fexofenadine (ALLEGRA) 60 MG tablet Take 60 mg by mouth daily as needed  for allergies or rhinitis.   finasteride (PROSCAR) 5 MG tablet TAKE 1 TABLET BY MOUTH ONCE DAILY FOR PROSTATE   fluticasone furoate-vilanterol (BREO ELLIPTA) 100-25 MCG/ACT AEPB Use 1 inhalation Daily for COPD / Asthma   glimepiride (AMARYL) 4 MG tablet TAKE 1/2-1 TABLET BY MOUTH daily with supper for diabetes   glucose blood test strip Test blood sugars once daily.Dx: E11.9   hydrOXYzine (ATARAX) 25 MG tablet Take 2 tablets (50 mg total) by mouth 3 (three) times daily as needed for itching. (Patient not taking: Reported on 08/31/2022)   meloxicam (MOBIC) 15 MG tablet Take 15 mg by mouth daily as needed.   metFORMIN (GLUCOPHAGE) 1000 MG tablet Take  1 tablet   2 x /day  with Meals for Diabetes                                                  /                            TAKE                              BY                            MOUTH   Nicotine 21-14-7 MG/24HR KIT Place 1 patch onto the skin daily in the afternoon. (Patient not taking: Reported on 08/31/2022)   nitroGLYCERIN (NITROSTAT) 0.4 MG SL tablet Place 1 tablet (0.4 mg total) under the tongue every 5 (five) minutes as needed for chest pain.   pramipexole (MIRAPEX) 0.5 MG tablet TAKE 1 TABLET BY MOUTH AT BEDTIME   ramipril (ALTACE) 5 MG capsule TAKE 1 CAPSULE BY MOUTH ONCE DAILY FOR  BLOOD  PRESSURE  AND  DIABETIC  KIDNEY  PROTECTION   rosuvastatin (CRESTOR) 20 MG tablet Take 1 tab three nights a week for cholesterol.   varenicline (CHANTIX CONTINUING MONTH PAK) 1 MG tablet Take 1 tablet (1 mg total) by mouth 2 (two) times daily. (Patient not taking: Reported on 08/31/2022)   No current facility-administered medications on file prior to visit.     Allergies: No Known Allergies   Medical History:  Past Medical History:  Diagnosis Date   CAD (coronary artery disease)    a. s/p stent to RCA 2005; b. LHC 2006 after Nuc with inf ischemia: patent RCA stent, D1 80%, LAD 60% => med Rx;  c. LHC 04/18/11: Proximal LAD 60% (unchanged from previous catheterization), distal LAD 65% (small caliber), Dx 60%, proximal CFX 20%, mid CFX 50%, OM1 30%, proximal RCA 20%, mid RCA stent patent, EF 65%.=> med Rx   GERD (gastroesophageal reflux disease)    HLD (hyperlipidemia)    Hypertension    Obesity    OSA (obstructive sleep apnea)    has a cpap-does not use-has a special mouthpiece he wears at night   Type II or unspecified type diabetes mellitus without mention of complication, not stated as uncontrolled    Vitamin D deficiency    Wears glasses    Family history- Reviewed and unchanged Social history- Reviewed and unchanged   Review of Systems:  Review of Systems  Constitutional:  Negative for malaise/fatigue and weight loss.  HENT:  Negative for hearing loss and tinnitus.   Eyes:  Negative for blurred vision and double vision.  Respiratory:  Negative for  cough, shortness of breath and wheezing.   Cardiovascular:  Negative for chest pain, palpitations, orthopnea, claudication and leg swelling.  Gastrointestinal:  Negative for abdominal pain, blood in stool, constipation, diarrhea, heartburn, melena, nausea and vomiting.  Genitourinary: Negative.   Musculoskeletal:  Negative for joint pain and myalgias.  Skin:  Negative for rash.  Neurological:  Negative for dizziness, tingling, sensory change, weakness and headaches.  Endo/Heme/Allergies:  Negative for polydipsia.  Psychiatric/Behavioral: Negative.    All other systems reviewed and are negative.   Physical Exam: There were no vitals taken for this visit. Wt Readings from Last 3 Encounters:  08/31/22 195 lb (88.5 kg)  07/18/22 189 lb (85.7 kg)  06/29/22 186 lb (84.4 kg)   General Appearance: Well nourished, in no apparent distress. Eyes: PERRLA, EOMs, conjunctiva no swelling or erythema Sinuses: No Frontal/maxillary tenderness ENT/Mouth: Ext aud canals clear, TMs without erythema, bulging. No erythema, swelling, or exudate on post pharynx.  Tonsils not swollen or erythematous. Hearing normal.  Neck: Supple, thyroid normal.  Respiratory: Respiratory effort normal, BS equal bilaterally without rales, rhonchi, wheezing or stridor.  Cardio: RRR with no MRGs. Brisk peripheral pulses without edema.  Abdomen: Soft, + BS.  Non tender, no guarding, rebound, hernias, masses. Lymphatics: Non tender without lymphadenopathy.  Musculoskeletal: Full ROM, 5/5 strength, Normal gait Skin: Warm, dry without rashes, lesions, ecchymosis.  Neuro: Cranial nerves intact. No cerebellar symptoms.  Psych: Awake and oriented X 3, normal affect, Insight and Judgment appropriate.    Alycia Rossetti, NP 10:53 AM [facility]

## 2022-10-03 ENCOUNTER — Encounter: Payer: Self-pay | Admitting: Nurse Practitioner

## 2022-10-03 ENCOUNTER — Ambulatory Visit (INDEPENDENT_AMBULATORY_CARE_PROVIDER_SITE_OTHER): Payer: Medicare HMO | Admitting: Nurse Practitioner

## 2022-10-03 VITALS — BP 108/64 | HR 74 | Temp 97.7°F | Ht 69.0 in | Wt 191.6 lb

## 2022-10-03 DIAGNOSIS — J8410 Pulmonary fibrosis, unspecified: Secondary | ICD-10-CM

## 2022-10-03 DIAGNOSIS — I700 Atherosclerosis of aorta: Secondary | ICD-10-CM

## 2022-10-03 DIAGNOSIS — E663 Overweight: Secondary | ICD-10-CM

## 2022-10-03 DIAGNOSIS — E785 Hyperlipidemia, unspecified: Secondary | ICD-10-CM

## 2022-10-03 DIAGNOSIS — E1169 Type 2 diabetes mellitus with other specified complication: Secondary | ICD-10-CM

## 2022-10-03 DIAGNOSIS — H6123 Impacted cerumen, bilateral: Secondary | ICD-10-CM

## 2022-10-03 DIAGNOSIS — J449 Chronic obstructive pulmonary disease, unspecified: Secondary | ICD-10-CM

## 2022-10-03 DIAGNOSIS — Z87891 Personal history of nicotine dependence: Secondary | ICD-10-CM | POA: Diagnosis not present

## 2022-10-03 DIAGNOSIS — Z79899 Other long term (current) drug therapy: Secondary | ICD-10-CM | POA: Diagnosis not present

## 2022-10-03 DIAGNOSIS — N182 Chronic kidney disease, stage 2 (mild): Secondary | ICD-10-CM | POA: Diagnosis not present

## 2022-10-03 DIAGNOSIS — I25118 Atherosclerotic heart disease of native coronary artery with other forms of angina pectoris: Secondary | ICD-10-CM | POA: Diagnosis not present

## 2022-10-03 DIAGNOSIS — I10 Essential (primary) hypertension: Secondary | ICD-10-CM

## 2022-10-03 DIAGNOSIS — G4733 Obstructive sleep apnea (adult) (pediatric): Secondary | ICD-10-CM | POA: Diagnosis not present

## 2022-10-03 DIAGNOSIS — E1122 Type 2 diabetes mellitus with diabetic chronic kidney disease: Secondary | ICD-10-CM | POA: Diagnosis not present

## 2022-10-03 DIAGNOSIS — E1159 Type 2 diabetes mellitus with other circulatory complications: Secondary | ICD-10-CM

## 2022-10-03 DIAGNOSIS — E559 Vitamin D deficiency, unspecified: Secondary | ICD-10-CM

## 2022-10-03 DIAGNOSIS — F17200 Nicotine dependence, unspecified, uncomplicated: Secondary | ICD-10-CM

## 2022-10-03 MED ORDER — CARBAMIDE PEROXIDE 6.5 % OT SOLN: 5.0000 [drp] | Freq: Two times a day (BID) | OTIC | 2 refills | Status: DC

## 2022-10-03 NOTE — Patient Instructions (Signed)

## 2022-10-04 LAB — CBC WITH DIFFERENTIAL/PLATELET
Absolute Monocytes: 659 cells/uL (ref 200–950)
Basophils Absolute: 27 cells/uL (ref 0–200)
Basophils Relative: 0.3 %
Eosinophils Absolute: 258 cells/uL (ref 15–500)
Eosinophils Relative: 2.9 %
HCT: 41.8 % (ref 38.5–50.0)
Hemoglobin: 14.4 g/dL (ref 13.2–17.1)
Lymphs Abs: 1914 cells/uL (ref 850–3900)
MCH: 32 pg (ref 27.0–33.0)
MCHC: 34.4 g/dL (ref 32.0–36.0)
MCV: 92.9 fL (ref 80.0–100.0)
MPV: 11.3 fL (ref 7.5–12.5)
Monocytes Relative: 7.4 %
Neutro Abs: 6043 cells/uL (ref 1500–7800)
Neutrophils Relative %: 67.9 %
Platelets: 203 10*3/uL (ref 140–400)
RBC: 4.5 10*6/uL (ref 4.20–5.80)
RDW: 11.8 % (ref 11.0–15.0)
Total Lymphocyte: 21.5 %
WBC: 8.9 10*3/uL (ref 3.8–10.8)

## 2022-10-04 LAB — COMPLETE METABOLIC PANEL WITH GFR
AG Ratio: 1.4 (calc) (ref 1.0–2.5)
ALT: 11 U/L (ref 9–46)
AST: 10 U/L (ref 10–35)
Albumin: 3.9 g/dL (ref 3.6–5.1)
Alkaline phosphatase (APISO): 97 U/L (ref 35–144)
BUN: 9 mg/dL (ref 7–25)
CO2: 26 mmol/L (ref 20–32)
Calcium: 9 mg/dL (ref 8.6–10.3)
Chloride: 105 mmol/L (ref 98–110)
Creat: 0.82 mg/dL (ref 0.70–1.28)
Globulin: 2.7 g/dL (calc) (ref 1.9–3.7)
Glucose, Bld: 136 mg/dL — ABNORMAL HIGH (ref 65–99)
Potassium: 4.3 mmol/L (ref 3.5–5.3)
Sodium: 138 mmol/L (ref 135–146)
Total Bilirubin: 0.3 mg/dL (ref 0.2–1.2)
Total Protein: 6.6 g/dL (ref 6.1–8.1)
eGFR: 94 mL/min/{1.73_m2} (ref >=60)

## 2022-10-04 LAB — LIPID PANEL
Cholesterol: 87 mg/dL (ref <200)
HDL: 46 mg/dL (ref >=40)
LDL Cholesterol (Calc): 27 mg/dL (calc)
Non-HDL Cholesterol (Calc): 41 mg/dL (calc) (ref <130)
Total CHOL/HDL Ratio: 1.9 (calc) (ref <5.0)
Triglycerides: 62 mg/dL (ref <150)

## 2022-10-04 LAB — HEMOGLOBIN A1C
Hgb A1c MFr Bld: 6.7 % of total Hgb — ABNORMAL HIGH (ref <5.7)
Mean Plasma Glucose: 146 mg/dL
eAG (mmol/L): 8.1 mmol/L

## 2022-10-06 NOTE — Progress Notes (Signed)
Patient is aware of lab results and instructions. -e welch

## 2022-10-12 ENCOUNTER — Telehealth: Payer: Self-pay | Admitting: Pharmacy Technician

## 2022-10-12 DIAGNOSIS — Z596 Low income: Secondary | ICD-10-CM

## 2022-10-12 NOTE — Progress Notes (Signed)
[facility])                                            [place] Team    10/12/2022  Joe Green [birth year] [id number]  Received both patient and provider portion(s) of patient assistance application(s) for [name]. Faxed completed application and required documents into [name].   Delylah Stanczyk P. Jolynne Spurgin, [place]  [phone]

## 2022-10-17 ENCOUNTER — Telehealth: Payer: Self-pay | Admitting: Pharmacy Technician

## 2022-10-17 DIAGNOSIS — Z596 Low income: Secondary | ICD-10-CM

## 2022-10-17 NOTE — Progress Notes (Signed)
[name])                                            [place] Team    10/17/2022  Joe Green 1951-07-14 [id number]  Care coordination call placed to [place] in regard to [name] application.  Spoke to [name] at [place] and he informs patient is APPROVED [id number] for [employer]. Medication will auto ship and fill based on last fill date in 2023 and going forward in 2024 with delivery to the patient's home. If patient feels like current supply is not adequate until next supply arrives, then patient can call [name] at [phone] or [place] call [name] at [phone].   [name] P. [name], [name]  [phone]

## 2022-10-27 DIAGNOSIS — J069 Acute upper respiratory infection, unspecified: Secondary | ICD-10-CM | POA: Diagnosis not present

## 2022-10-27 DIAGNOSIS — R0981 Nasal congestion: Secondary | ICD-10-CM | POA: Diagnosis not present

## 2022-10-27 DIAGNOSIS — R051 Acute cough: Secondary | ICD-10-CM | POA: Diagnosis not present

## 2022-10-31 ENCOUNTER — Emergency Department (HOSPITAL_COMMUNITY): Payer: Medicare HMO

## 2022-10-31 ENCOUNTER — Other Ambulatory Visit: Payer: Self-pay

## 2022-10-31 ENCOUNTER — Emergency Department (HOSPITAL_COMMUNITY)
Admission: EM | Admit: 2022-10-31 | Discharge: 2022-10-31 | Disposition: A | Discharge status: Home / Self Care | Payer: Medicare HMO | Attending: Emergency Medicine | Admitting: Emergency Medicine

## 2022-10-31 ENCOUNTER — Encounter (HOSPITAL_COMMUNITY): Payer: Self-pay

## 2022-10-31 DIAGNOSIS — N200 Calculus of kidney: Secondary | ICD-10-CM | POA: Diagnosis not present

## 2022-10-31 DIAGNOSIS — I10 Essential (primary) hypertension: Secondary | ICD-10-CM | POA: Insufficient documentation

## 2022-10-31 DIAGNOSIS — Z87891 Personal history of nicotine dependence: Secondary | ICD-10-CM | POA: Diagnosis not present

## 2022-10-31 DIAGNOSIS — S39012A Strain of muscle, fascia and tendon of lower back, initial encounter: Secondary | ICD-10-CM | POA: Insufficient documentation

## 2022-10-31 DIAGNOSIS — I2510 Atherosclerotic heart disease of native coronary artery without angina pectoris: Secondary | ICD-10-CM | POA: Insufficient documentation

## 2022-10-31 DIAGNOSIS — E1165 Type 2 diabetes mellitus with hyperglycemia: Secondary | ICD-10-CM | POA: Insufficient documentation

## 2022-10-31 DIAGNOSIS — K5730 Diverticulosis of large intestine without perforation or abscess without bleeding: Secondary | ICD-10-CM | POA: Diagnosis not present

## 2022-10-31 DIAGNOSIS — Z7982 Long term (current) use of aspirin: Secondary | ICD-10-CM | POA: Insufficient documentation

## 2022-10-31 DIAGNOSIS — Z79899 Other long term (current) drug therapy: Secondary | ICD-10-CM | POA: Diagnosis not present

## 2022-10-31 DIAGNOSIS — X58XXXA Exposure to other specified factors, initial encounter: Secondary | ICD-10-CM | POA: Insufficient documentation

## 2022-10-31 DIAGNOSIS — Z7984 Long term (current) use of oral hypoglycemic drugs: Secondary | ICD-10-CM | POA: Diagnosis not present

## 2022-10-31 DIAGNOSIS — R109 Unspecified abdominal pain: Secondary | ICD-10-CM | POA: Diagnosis present

## 2022-10-31 LAB — URINALYSIS, ROUTINE W REFLEX MICROSCOPIC
Bilirubin Urine: NEGATIVE
Glucose, UA: NEGATIVE mg/dL
Hgb urine dipstick: NEGATIVE
Ketones, ur: NEGATIVE mg/dL
Leukocytes,Ua: NEGATIVE
Nitrite: NEGATIVE
Protein, ur: NEGATIVE mg/dL
Specific Gravity, Urine: 1.018 (ref 1.005–1.030)
pH: 6 (ref 5.0–8.0)

## 2022-10-31 LAB — CBC WITH DIFFERENTIAL/PLATELET
Abs Immature Granulocytes: 0.04 10*3/uL (ref 0.00–0.07)
Basophils Absolute: 0.1 10*3/uL (ref 0.0–0.1)
Basophils Relative: 0 %
Eosinophils Absolute: 0.2 10*3/uL (ref 0.0–0.5)
Eosinophils Relative: 2 %
HCT: 41.6 % (ref 39.0–52.0)
Hemoglobin: 14 g/dL (ref 13.0–17.0)
Immature Granulocytes: 0 %
Lymphocytes Relative: 10 %
Lymphs Abs: 1.3 10*3/uL (ref 0.7–4.0)
MCH: 31.5 pg (ref 26.0–34.0)
MCHC: 33.7 g/dL (ref 30.0–36.0)
MCV: 93.5 fL (ref 80.0–100.0)
Monocytes Absolute: 1.1 10*3/uL — ABNORMAL HIGH (ref 0.1–1.0)
Monocytes Relative: 9 %
Neutro Abs: 9.7 10*3/uL — ABNORMAL HIGH (ref 1.7–7.7)
Neutrophils Relative %: 79 %
Platelets: 179 10*3/uL (ref 150–400)
RBC: 4.45 MIL/uL (ref 4.22–5.81)
RDW: 12.6 % (ref 11.5–15.5)
WBC: 12.3 10*3/uL — ABNORMAL HIGH (ref 4.0–10.5)
nRBC: 0 % (ref 0.0–0.2)

## 2022-10-31 LAB — COMPREHENSIVE METABOLIC PANEL
ALT: 16 U/L (ref 0–44)
AST: 15 U/L (ref 15–41)
Albumin: 3.4 g/dL — ABNORMAL LOW (ref 3.5–5.0)
Alkaline Phosphatase: 102 U/L (ref 38–126)
Anion gap: 11 (ref 5–15)
BUN: 12 mg/dL (ref 8–23)
CO2: 22 mmol/L (ref 22–32)
Calcium: 9 mg/dL (ref 8.9–10.3)
Chloride: 101 mmol/L (ref 98–111)
Creatinine, Ser: 0.89 mg/dL (ref 0.61–1.24)
GFR, Estimated: >60 mL/min (ref >60)
Glucose, Bld: 178 mg/dL — ABNORMAL HIGH (ref 70–99)
Potassium: 4.1 mmol/L (ref 3.5–5.1)
Sodium: 134 mmol/L — ABNORMAL LOW (ref 135–145)
Total Bilirubin: 0.5 mg/dL (ref 0.3–1.2)
Total Protein: 6.8 g/dL (ref 6.5–8.1)

## 2022-10-31 LAB — LIPASE, BLOOD: Lipase: 26 U/L (ref 11–51)

## 2022-10-31 MED ORDER — MORPHINE SULFATE (PF) 4 MG/ML IV SOLN
4.0000 mg | Freq: Once | INTRAVENOUS | Status: AC
  Administered 2022-10-31: 4 mg via INTRAVENOUS
  Filled 2022-10-31: qty 1

## 2022-10-31 MED ORDER — SODIUM CHLORIDE 0.9 % IV BOLUS
500.0000 mL | Freq: Once | INTRAVENOUS | Status: AC
  Administered 2022-10-31: 500 mL via INTRAVENOUS

## 2022-10-31 MED ORDER — HYDROCODONE-ACETAMINOPHEN 5-325 MG PO TABS: 1.0000 | ORAL_TABLET | ORAL | 0 refills | Status: DC | PRN

## 2022-10-31 MED ORDER — DIAZEPAM 5 MG PO TABS: 5.0000 mg | ORAL_TABLET | Freq: Two times a day (BID) | ORAL | 0 refills | Status: DC | PRN

## 2022-10-31 MED ORDER — ONDANSETRON HCL 4 MG/2ML IJ SOLN
4.0000 mg | Freq: Once | INTRAMUSCULAR | Status: AC
  Administered 2022-10-31: 4 mg via INTRAVENOUS
  Filled 2022-10-31: qty 2

## 2022-10-31 MED ORDER — KETOROLAC TROMETHAMINE 30 MG/ML IJ SOLN
30.0000 mg | Freq: Once | INTRAMUSCULAR | Status: AC
  Administered 2022-10-31: 30 mg via INTRAVENOUS
  Filled 2022-10-31: qty 1

## 2022-10-31 MED ORDER — DIAZEPAM 5 MG/ML IJ SOLN
2.5000 mg | Freq: Once | INTRAMUSCULAR | Status: AC
  Administered 2022-10-31: 2.5 mg via INTRAVENOUS
  Filled 2022-10-31: qty 2

## 2022-10-31 NOTE — ED Provider Notes (Signed)
[facility] Provider Note   CSN: [id number] Arrival date & time: 10/31/22  1000     History  Chief Complaint  Patient presents with   Flank Pain    Joe Green is a 72 y.o. male.  Pt is a 72 yo male with pmhx significant for DM, CAD, HLD, HTN, GERD, sleep apnea, and hx former tobacco abuse.  Pt said he developed left sided flank pain yesterday.  He had a muscle relaxer he took last night, but it did not help.  No n/v.  No injury.  He has never had anything like this in the past.  He denies cp.  He has sob when the pain comes.  He denies urinary sx or bowel/bladder incont.       Home Medications Prior to Admission medications   Medication Sig Start Date End Date Taking? Authorizing Provider  diazepam (VALIUM) 5 MG tablet Take 1 tablet (5 mg total) by mouth 2 (two) times daily as needed for anxiety. 10/31/22  Yes Isla Pence, MD  HYDROcodone-acetaminophen (NORCO/VICODIN) 5-325 MG tablet Take 1 tablet by mouth every 4 (four) hours as needed. 10/31/22  Yes Isla Pence, MD  acetaminophen (TYLENOL) 500 MG tablet Take 2 tablets (1,000 mg total) by mouth every 6 (six) hours. 01/18/19   Focht, Fraser Din, PA  albuterol (VENTOLIN HFA) 108 (90 Base) MCG/ACT inhaler Inhale 2 puffs into the lungs every 6 (six) hours as needed for wheezing or shortness of breath. 06/29/22   Alycia Rossetti, NP  aspirin EC 81 MG tablet Take 1 tablet (81 mg total) by mouth daily. 11/17/16   Eileen Stanford, PA-C  blood glucose meter kit and supplies Dispense based on patient and insurance preference. Use up to four times daily as directed. (FOR ICD-9 250.00, 250.01). 06/25/17   [name], PA-C  carbamide peroxide (DEBROX) 6.5 % OTIC solution Place 5 drops into the right ear 2 (two) times daily. 10/03/22 10/03/23  Alycia Rossetti, NP  Cholecalciferol (VITAMIN D) 50 MCG (2000 UT) tablet Take 1 tablet (2,000 Units total) by mouth daily. Patient taking  differently: Take 10,000 Units by mouth daily. 01/18/19   Focht, Fraser Din, PA  Cinnamon 500 MG TABS Take 1 tablet by mouth in the morning and at bedtime.    [provider]  doxazosin (CARDURA) 8 MG tablet Take  1 tablet  at Bedtime  for BP & Prostate 09/29/21   [name], MD  Dulaglutide ([name]) 3 0000000 SOPN Inject 3 mg as directed once a week. 03/30/22   Alycia Rossetti, NP  fexofenadine (ALLEGRA) 60 MG tablet Take 60 mg by mouth daily as needed for allergies or rhinitis.    [provider]  finasteride (PROSCAR) 5 MG tablet TAKE 1 TABLET BY MOUTH ONCE DAILY FOR PROSTATE 09/18/22   Alycia Rossetti, NP  fluticasone furoate-vilanterol (BREO ELLIPTA) 100-25 MCG/ACT AEPB Use 1 inhalation Daily for COPD / Asthma 03/01/22   [name], MD  glimepiride (AMARYL) 4 MG tablet TAKE 1/2-1 TABLET BY MOUTH daily with supper for diabetes 06/29/22   Alycia Rossetti, NP  glucose blood test strip Test blood sugars once daily.Dx: E11.9 03/02/17   [name], PA-C  metFORMIN (GLUCOPHAGE) 1000 MG tablet Take  1 tablet   2 x /day  with Meals for Diabetes                                                  /  TAKE                              BY                           MOUTH 04/30/22   [name], MD  nitroGLYCERIN (NITROSTAT) 0.4 MG SL tablet Place 1 tablet (0.4 mg total) under the tongue every 5 (five) minutes as needed for chest pain. 03/29/22   Alycia Rossetti, NP  pramipexole (MIRAPEX) 0.5 MG tablet TAKE 1 TABLET BY MOUTH AT BEDTIME 09/26/22   Alycia Rossetti, NP  ramipril (ALTACE) 5 MG capsule TAKE 1 CAPSULE BY MOUTH ONCE DAILY FOR  BLOOD  PRESSURE  AND  DIABETIC  KIDNEY  PROTECTION 08/24/22   Alycia Rossetti, NP  rosuvastatin (CRESTOR) 20 MG tablet Take 1 tab three nights a week for cholesterol. 09/29/21   [name], MD      Allergies    Patient has no known allergies.    Review of Systems   Review of Systems  Musculoskeletal:   Positive for back pain.  All other systems reviewed and are negative.   Physical Exam Updated Vital Signs BP 124/68   Pulse 67   Temp 97.8 F (36.6 C)   Resp 18   Ht 5\' 10"  (1.778 m)   Wt 81.6 kg   SpO2 94%   BMI 25.83 kg/m  Physical Exam Vitals and nursing note reviewed.  Constitutional:      Appearance: Normal appearance.  HENT:     Head: Normocephalic and atraumatic.     Right Ear: External ear normal.     Left Ear: External ear normal.     Nose: Nose normal.     Mouth/Throat:     Mouth: Mucous membranes are moist.     Pharynx: Oropharynx is clear.  Eyes:     Extraocular Movements: Extraocular movements intact.     Conjunctiva/sclera: Conjunctivae normal.     Pupils: Pupils are equal, round, and reactive to light.  Cardiovascular:     Rate and Rhythm: Normal rate and regular rhythm.     Pulses: Normal pulses.     Heart sounds: Normal heart sounds.  Pulmonary:     Effort: Pulmonary effort is normal.     Breath sounds: Normal breath sounds.  Abdominal:     General: Abdomen is flat. Bowel sounds are normal.     Palpations: Abdomen is soft.  Musculoskeletal:        General: Normal range of motion.     Cervical back: Normal range of motion and neck supple.  Skin:    General: Skin is warm.     Capillary Refill: Capillary refill takes less than 2 seconds.  Neurological:     General: No focal deficit present.     Mental Status: He is alert and oriented to person, place, and time.  Psychiatric:        Mood and Affect: Mood normal.        Behavior: Behavior normal.     ED Results / Procedures / Treatments   Labs (all labs ordered are listed, but only abnormal results are displayed) Labs Reviewed  CBC WITH DIFFERENTIAL/PLATELET - Abnormal; Notable for the following components:      Result Value   WBC 12.3 (*)    Neutro Abs 9.7 (*)    Monocytes Absolute 1.1 (*)  All other components within normal limits  COMPREHENSIVE METABOLIC PANEL - Abnormal; Notable for  the following components:   Sodium 134 (*)    Glucose, Bld 178 (*)    Albumin 3.4 (*)    All other components within normal limits  LIPASE, BLOOD  URINALYSIS, ROUTINE W REFLEX MICROSCOPIC    EKG None  Radiology CT RENAL STONE STUDY  Result Date: 10/31/2022 CLINICAL DATA:  Left flank pain EXAM: CT ABDOMEN AND PELVIS WITHOUT CONTRAST TECHNIQUE: Multidetector CT imaging of the abdomen and pelvis was performed following the standard protocol without IV contrast. RADIATION DOSE REDUCTION: This exam was performed according to the departmental dose-optimization program which includes automated exposure control, adjustment of the mA and/or kV according to patient size and/or use of iterative reconstruction technique. COMPARISON:  06/09/2021 FINDINGS: Lower chest: Coronary artery calcifications are seen. Slight prominence of interstitial markings in the lower lung fields may suggest scarring. Evaluation of lung fields is limited by motion artifacts. Hepatobiliary: No focal abnormalities are seen in liver. There is no dilation of bile ducts. There are calcified gallbladder stones. Pancreas: No focal abnormalities are seen in the pancreas. Spleen: Unremarkable. Adrenals/Urinary Tract: No interval changes are noted in the adrenals. There is no hydronephrosis. There is a 10 mm calculus in the lower portion of right kidney. Ureters are not dilated. Urinary bladder is unremarkable. Stomach/Bowel: Stomach is not distended. Small bowel loops are not dilated. Appendix is not dilated. There is no significant wall thickening in colon. Scattered diverticula are seen in the colon without signs of focal diverticulitis. Vascular/Lymphatic: Extensive arterial calcifications are seen. No significant lymphadenopathy seen. Reproductive: There are coarse calcifications in prostate. Other: There is no ascites or pneumoperitoneum. Musculoskeletal: There is mild anterolisthesis at L4-L5 level. Degenerative changes are noted in  lumbar spine. IMPRESSION: There is no evidence of intestinal obstruction or pneumoperitoneum. There is no hydronephrosis. Appendix is not dilated. 10 mm non obstructing right renal calculus. Gallbladder stones. Diverticulosis of colon. Arteriosclerosis. Coronary artery disease. Other findings as described in the body of the report. Electronically Signed   By: Elmer Picker M.D.   On: 10/31/2022 10:46    Procedures Procedures    Medications Ordered in ED Medications  ondansetron (ZOFRAN) injection 4 mg (4 mg Intravenous Given 10/31/22 1035)  morphine (PF) 4 MG/ML injection 4 mg (4 mg Intravenous Given 10/31/22 1036)  sodium chloride 0.9 % bolus 500 mL (0 mLs Intravenous Stopped 10/31/22 1148)  ketorolac (TORADOL) 30 MG/ML injection 30 mg (30 mg Intravenous Given 10/31/22 1306)  diazepam (VALIUM) injection 2.5 mg (2.5 mg Intravenous Given 10/31/22 1304)    ED Course/ Medical Decision Making/ A&P                             Medical Decision Making Amount and/or Complexity of Data Reviewed Labs: ordered. Radiology: ordered.  Risk Prescription drug management.   This patient presents to the ED for concern of back pain, this involves an extensive number of treatment options, and is a complaint that carries with it a high risk of complications and morbidity.  The differential diagnosis includes kidney stone, pyelo, aortic issue, msk   Co morbidities that complicate the patient evaluation  DM, CAD, HLD, HTN, GERD, sleep apnea, and hx former tobacco abuse   Additional history obtained:  Additional history obtained from epic chart review  Lab Tests:  I Ordered, and personally interpreted labs.  The pertinent results include:  cbc with  wbc 12.3, cmp nl other than glucose elevated at 178, lip nl, ua nl   Imaging Studies ordered:  I ordered imaging studies including ct renal  I independently visualized and interpreted imaging which showed  There is no evidence of intestinal  obstruction or pneumoperitoneum.  There is no hydronephrosis. Appendix is not dilated.    10 mm non obstructing right renal calculus. Gallbladder stones.  Diverticulosis of colon. Arteriosclerosis. Coronary artery disease.    Other findings as described in the body of the report.   I agree with the radiologist interpretation   Cardiac Monitoring:  The patient was maintained on a cardiac monitor.  I personally viewed and interpreted the cardiac monitored which showed an underlying rhythm of: nsr   Medicines ordered and prescription drug management:  I ordered medication including morphine  for pain  Reevaluation of the patient after these medicines showed that the patient improved I have reviewed the patients home medicines and have made adjustments as needed   Test Considered:  ct   Critical Interventions:  Pain control   Problem List / ED Course:  Back pain:  msk.  No evidence of intraabdominal or intrapelvic process.  UA nl.  Pt feels much better.  He is stable for d/c.  Return if worse.    Reevaluation:  After the interventions noted above, I reevaluated the patient and found that they have :improved   Social Determinants of Health:  Lives at home   Dispostion:  After consideration of the diagnostic results and the patients response to treatment, I feel that the patent would benefit from discharge with outpatient f/u.          Final Clinical Impression(s) / ED Diagnoses Final diagnoses:  Strain of lumbar region, initial encounter    Rx / DC Orders ED Discharge Orders          Ordered    diazepam (VALIUM) 5 MG tablet  2 times daily PRN        10/31/22 1451    HYDROcodone-acetaminophen (NORCO/VICODIN) 5-325 MG tablet  Every 4 hours PRN        10/31/22 1451              Isla Pence, MD 10/31/22 1607

## 2022-10-31 NOTE — ED Notes (Signed)
ED Provider at bedside. 

## 2022-10-31 NOTE — ED Triage Notes (Signed)
Pt c/o L flank pain x1 day.  Pain score 8/10.  Denies n/v/d.  Denies injury.  Pt reports taking a muscle relaxer w/o relief

## 2022-11-01 ENCOUNTER — Telehealth: Payer: Self-pay

## 2022-11-01 NOTE — Progress Notes (Signed)
Patient was seen at [facility] ED on 10/31/22 due to S39 : Strain of muscle, fascia and tendon of lower back, initial encounter. Calling patient. Patient states that he is doing better and the medications he was given is helping. I asked if he has a ortho provider and he says no. I advised him to follow up with Dr. Melford Aase as recommended and he can refer him to ortho or a neuro surgeon if needed. He agreed and states that he will call and make appointment so that he can get his back in better shape.   Time Spent: 10 min.  Hildred Alamin, CTL

## 2022-11-05 NOTE — Progress Notes (Unsigned)
Cardiology Office Note    Date:  11/09/2022   ID:  Joe Green, DOB [birth year], MRN [id number]   PCP:  [name], Joe Green  Cardiologist:  Jenkins Rouge, MD   [phone]   History of Present Illness:  Joe Green is a 72 y.o. male with history of hypertension, HLD, DM2, COPD, CAD with stent to the RCA 2005, chronic RBBB, cath 2006 patent RCA stent with residual 80% diagonal and moderate LAD diagonal 1 disease.  Cath 2012  patent RCA stent, proximal LAD 60% unchanged LVEF 65% medical therapy, normal NST 07/2019  I haven't seen since 07/15/19 Last seen by PA 07/19/21   Patient comes in for f/u. Has an occasional pressure that eases within minutes. Not associated with activity. Happens at random times. Comes in because CT showed aortic atherosclerosis and he wanted to know more about it. Does projects around the house and has a workshop. No regular exercise outside of all the work on his house. Has a bike at home   Myovue done 07/24/19 normal no ischemia EF 65%  Normal ABI's 05/20/20   Seen in ED 10/31/22 for lumbar strain and flank pain Had non obstructing uretal stone Rx with valium and Norco  Bullous emphysema and ILD Sees Joe Green pulmonary Deferred lung biopsy with Joe Green 40 pack year smoking history ? 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 s/p stent to RCA 2005; b. LHC 2006 after Nuc with inf ischemia: patent RCA stent, D1 80%, LAD 60% => med Rx;  c. LHC 04/18/11: Proximal LAD 60% (unchanged from previous catheterization), distal LAD 65% (small caliber), Dx 60%, proximal CFX 20%, mid CFX 50%, OM1 30%, proximal RCA 20%, mid  RCA stent patent, EF 65%.=> med Rx   GERD (gastroesophageal reflux disease)    HLD (hyperlipidemia)    Hypertension    Obesity    OSA (obstructive sleep apnea)    has a cpap-does not use-has a special mouthpiece he wears at night   Type II or unspecified type diabetes mellitus without mention of complication, not stated as uncontrolled    Vitamin D deficiency    Wears glasses     Past Surgical History:  Procedure Laterality Date   CARDIAC CATHETERIZATION  2005   stent   CHONDROPLASTY  06/25/2017   Procedure: CHONDROPLASTY;  Surgeon: Joe Leitz, MD;  Location: [facility];  Service: Orthopedics;;   COLONOSCOPY  11/2011   Due 5 years Joe. Fuller Plan   heart stent  2005   [place] Right 11/16/2014   Procedure: RIGHT INGUINAL HERNIA REPAIR WITH MESH;  Surgeon: Joe Gemma, MD;  Location: [facility];  Service: General;  Laterality: Right;   INSERTION OF MESH Right 11/16/2014   Procedure: INSERTION OF MESH;  Surgeon: Joe Gemma, MD;  Location: [facility];  Service: General;  Laterality: Right;   KNEE ARTHROSCOPY WITH MEDIAL MENISECTOMY Bilateral 06/25/2017   Procedure: right knee arthroscopy with medial meniscectomy, chondroplasty, Left knee arthroscopy with medial menisectomy removal of plica and chondroplasty;  Surgeon: Joe Leitz, MD;  Location: [facility];  Service: Orthopedics;  Laterality: Bilateral;   POLYPECTOMY      Current Medications: Current Meds  Medication Sig   acetaminophen (TYLENOL) 500 MG tablet Take 2 tablets (1,000 mg total) by mouth every 6 (six) hours.   albuterol (VENTOLIN HFA) 108 (90 Base) MCG/ACT inhaler Inhale 2 puffs into the lungs every 6 (six) hours as needed for wheezing or shortness of breath.   aspirin EC 81 MG tablet Take 1 tablet (81 mg total) by mouth daily.   blood glucose meter kit and supplies Dispense based on patient and insurance preference. Use up to four times daily as  directed. (FOR ICD-9 250.00, 250.01).   carbamide peroxide (DEBROX) 6.5 % OTIC solution Place 5 drops into the right ear 2 (two) times daily.   Cholecalciferol (VITAMIN D) 50 MCG (2000 UT) tablet Take 1 tablet (2,000 Units total) by mouth daily. (Patient taking differently: Take 10,000 Units by mouth daily.)   Cinnamon 500 MG TABS Take 1 tablet by mouth in the morning and at bedtime.   doxazosin (CARDURA) 8 MG tablet Take  1 tablet  at Bedtime  for BP & Prostate   Dulaglutide ([name]) 3 0000000 SOPN Inject 3 mg as directed once a week.   fexofenadine (ALLEGRA) 60 MG tablet Take 60 mg by mouth daily as needed for allergies or rhinitis.   finasteride (PROSCAR) 5 MG tablet TAKE 1 TABLET BY MOUTH ONCE DAILY FOR PROSTATE   fluticasone furoate-vilanterol (BREO ELLIPTA) 100-25 MCG/ACT AEPB Use 1 inhalation Daily for COPD / Asthma   glimepiride (AMARYL) 4 MG tablet TAKE 1/2-1 TABLET BY MOUTH daily with supper for diabetes   glucose blood test strip Test blood sugars once daily.Dx: E11.9   metFORMIN (GLUCOPHAGE) 1000 MG tablet Take  1 tablet   2 x /day  with Meals for Diabetes                                                  /                            TAKE                              BY                           MOUTH   nitroGLYCERIN (NITROSTAT) 0.4 MG SL tablet Place 1 tablet (0.4 mg total) under the tongue every 5 (five) minutes as needed for chest pain.   pramipexole (MIRAPEX) 0.5 MG tablet TAKE 1 TABLET BY MOUTH AT BEDTIME   ramipril (ALTACE) 5 MG capsule TAKE 1 CAPSULE BY MOUTH ONCE DAILY FOR  BLOOD  PRESSURE  AND  DIABETIC  KIDNEY  PROTECTION   rosuvastatin (CRESTOR) 20 MG tablet Take 1 tab three nights a week for cholesterol.     Allergies:   Patient has no known allergies.   Social History   Socioeconomic History   Marital status: Married    Spouse name: Not on file   Number of children: 3   Years of education: Not on file   Highest education level: Not on file  Occupational History  Not on file  Tobacco Use   Smoking status: Former    Packs/day: 0.50    Years: 51.00    Additional pack years: 0.00    Total pack years: 25.50    Types: Cigarettes    Start date: 1970   Smokeless tobacco: Never   Tobacco comments:    Smokes less then 1/2 pack daily/07/18/22  Vaping Use   Vaping Use: Never used  Substance and Sexual Activity   Alcohol use: Yes    Alcohol/week: 14.0 standard drinks of alcohol    Types: 14 Cans of beer per week   Drug use: No   Sexual activity: Yes    Partners: Female    Birth control/protection: Post-menopausal  Other Topics Concern   Not on file  Social History Narrative   Not on file   Social Determinants of Health   Financial Resource Strain: Not on file  Food Insecurity: No Food Insecurity (08/12/2021)   Hunger Vital Sign    Worried About Running Out of Food in the Last Year: Never true    Ran Out of Food in the Last Year: Never true  Transportation Needs: No Transportation Needs (08/12/2021)   PRAPARE - Hydrologist (Medical): No    Lack of Transportation (Non-Medical): No  Physical Activity: Sufficiently Active (01/01/2018)   Exercise Vital Sign    Days of Exercise per Week: 5 days    Minutes of Exercise per Session: 60 min  Stress: No Stress Concern Present (03/10/2019)   [place]    Feeling of Stress : Only a little  Social Connections: Not on file     Family History:  The patient's  family history includes Cancer in his mother; Hypertension in his father; Kidney cancer in his maternal grandmother; Prostate cancer in his father.   ROS:   Please see the history of present illness.    ROS All other systems reviewed and are negative.   PHYSICAL EXAM:   VS:  BP 104/60   Pulse 65   Ht 5\' 10"  (1.778 m)   Wt 187 lb 3.2 oz (84.9 kg)   SpO2 97%   BMI 26.86 kg/m   Physical Exam  GEN: Well nourished, well developed, in no acute  distress  Neck: no JVD, carotid bruits, or masses Cardiac:RRR; no murmurs, rubs, or gallops  Respiratory: decreased breath sounds but clear to auscultation bilaterally, normal work of breathing GI: soft, nontender, nondistended, + BS Ext: without cyanosis, clubbing, or edema, Good distal pulses bilaterally Neuro:  Alert and Oriented x 3, Psych: euthymic mood, full affect  Wt Readings from Last 3 Encounters:  11/09/22 187 lb 3.2 oz (84.9 kg)  10/31/22 180 lb (81.6 kg)  10/03/22 191 lb 9.6 oz (86.9 kg)      Studies/Labs Reviewed:   EKG:   SR RBBB 06/29/22 old IMI  Recent Labs: 06/29/2022: Magnesium 1.9; TSH 0.88 10/31/2022: ALT 16; BUN 12; Creatinine, Ser 0.89; Hemoglobin 14.0; Platelets 179; Potassium 4.1; Sodium 134   Lipid Panel    Component Value Date/Time   CHOL 87 10/03/2022 1107   TRIG 62 10/03/2022 1107   HDL 46 10/03/2022 1107   CHOLHDL 1.9 10/03/2022 1107   VLDL 17 03/02/2017 1115   LDLCALC 27 10/03/2022 1107    Additional studies/ records that were reviewed today include:   CT chest 04/2021 IMPRESSION: 1. Pulmonary parenchymal pattern of peripheral and basilar predominant ground-glass with very minimal subpleural  reticulation and traction bronchiolectasis, similar to 02/17/2020. Findings may be due to nonspecific interstitial pneumonitis. Findings are suggestive of an alternative diagnosis (not UIP) per consensus guidelines: Diagnosis of Idiopathic Pulmonary Fibrosis: An Official ATS/ERS/JRS/ALAT Clinical Practice Guideline. Ladd, Iss 5, [phone], Apr 14 2017. 2. Cholelithiasis. 3. Aortic atherosclerosis (ICD10-I70.0). Coronary artery calcification. 4.  Emphysema (ICD10-J43.9).  CT abdomen 05/2021 IMPRESSION: 1. No acute process identified.  No hernia visualized. 2. Cholelithiasis. 3. Right renal calculus. 4. Mild colonic diverticulosis. 5. Marked prostatomegaly    Vascular/Lymphatic: Aortic atherosclerosis. No enlarged  abdominal or pelvic lymph nodes     NST 07/24/2019 Nuclear stress EF: 65%. The left ventricular ejection fraction is normal (55-65%). There was no ST segment deviation noted during stress. This is a low risk study. There is no evidence of previous myocardial infarction or evidence of ischemia. The study is normal   PLAN:  In order of problems listed above:  CAD status post stent to the RCA in 2005 with residual disease on follow-up in 2006 in 2012 but patent RCA stent.  NST 12/102020 no ischemia-Some ? Angina f/u Lexiscan myovue ordered   Hypertension-BP controlled at home  HLD LDL 27 09/15/22 on Crestor  DM2-A1C down 6.7 improved   Aortic atherosclerosis on chest and abdominal CT recently reviewed. No aneurysm. On a statin.   Pulmonary: Active smoker with ILD, bullous emphysema and OSA F/U Joe Vaughan [facility] Has albuterol inhaler   Lexiscan Myovue  F/U in a year if low risk stress test   Medication Adjustments/Labs and Tests Ordered: Current medicines are reviewed at length with the patient today.  Concerns regarding medicines are outlined above.  Medication changes, Labs and Tests ordered today are listed in the Patient Instructions below. There are no Patient Instructions on file for this visit.   Signed, Jenkins Rouge, MD  11/09/2022 11:10 AM    [address], [place], [place]  [address] Phone: [phone]; Fax: [phone]

## 2022-11-06 ENCOUNTER — Telehealth: Payer: Self-pay

## 2022-11-06 NOTE — Telephone Encounter (Signed)
     Patient  visit on 10/31/2022  at [facility] was for flank pain.  Have you been able to follow up with your primary care physician? Patient is following up with a specialist.  The patient was or was not able to obtain any needed medicine or equipment. Patient was able to obtain medication.  Are there diet recommendations that you are having difficulty following? No  Patient expresses understanding of discharge instructions and education provided has no other needs at this time. Yes   [facility] Resource Care Guide   [email or web address]  ?? [email or web address]   [email or web address]: [email or web address]  [email or web address]

## 2022-11-09 ENCOUNTER — Encounter: Payer: Self-pay | Admitting: Cardiovascular Disease

## 2022-11-09 ENCOUNTER — Ambulatory Visit: Payer: Medicare HMO | Attending: Cardiovascular Disease | Admitting: Cardiovascular Disease

## 2022-11-09 VITALS — BP 104/60 | HR 65 | Ht 70.0 in | Wt 187.2 lb

## 2022-11-09 DIAGNOSIS — E1159 Type 2 diabetes mellitus with other circulatory complications: Secondary | ICD-10-CM

## 2022-11-09 DIAGNOSIS — R079 Chest pain, unspecified: Secondary | ICD-10-CM | POA: Diagnosis not present

## 2022-11-09 DIAGNOSIS — E782 Mixed hyperlipidemia: Secondary | ICD-10-CM

## 2022-11-09 DIAGNOSIS — I25119 Atherosclerotic heart disease of native coronary artery with unspecified angina pectoris: Secondary | ICD-10-CM | POA: Diagnosis not present

## 2022-11-09 DIAGNOSIS — I10 Essential (primary) hypertension: Secondary | ICD-10-CM

## 2022-11-09 DIAGNOSIS — I209 Angina pectoris, unspecified: Secondary | ICD-10-CM

## 2022-11-09 DIAGNOSIS — I2510 Atherosclerotic heart disease of native coronary artery without angina pectoris: Secondary | ICD-10-CM

## 2022-11-09 NOTE — Patient Instructions (Signed)
Medication Instructions:  Your physician recommends that you continue on your current medications as directed. Please refer to the Current Medication list given to you today.  *If you need a refill on your cardiac medications before your next appointment, please call your pharmacy*  Lab Work: If you have labs (blood work) drawn today and your tests are completely normal, you will receive your results only by: [facility] (if you have MyChart) OR A paper copy in the mail If you have any lab test that is abnormal or we need to change your treatment, we will call you to review the results.  Testing/Procedures: Your physician has requested that you have a lexiscan myoview. For further information please visit [email or web address]. Please follow instruction sheet, as given.  Follow-Up: At [facility], you and your health needs are our priority.  As part of our continuing mission to provide you with exceptional heart care, we have created designated Provider Care Teams.  These Care Teams include your primary Cardiologist (physician) and Advanced Practice Providers (APPs -  Physician Assistants and Nurse Practitioners) who all work together to provide you with the care you need, when you need it.  We recommend signing up for the patient portal called "MyChart".  Sign up information is provided on this After Visit Summary.  MyChart is used to connect with patients for Virtual Visits (Telemedicine).  Patients are able to view lab/test results, encounter notes, upcoming appointments, etc.  Non-urgent messages can be sent to your provider as well.   To learn more about what you can do with MyChart, go to [email or web address].    Your next appointment:   1 year(s)  Provider:   Jenkins Rouge, MD

## 2022-11-09 NOTE — Addendum Note (Signed)
Addended by: Aris Georgia, Lillymae Duet L on: 11/09/2022 11:25 AM   Modules accepted: Orders

## 2022-11-16 ENCOUNTER — Telehealth: Payer: Self-pay

## 2022-11-16 NOTE — Progress Notes (Signed)
Patient: Joe Green, Joe Green DOB: 10/23/1950   HC focus outreach prep started. Reviewing office visits, specialists visits, hospital visits, adherence, medications, and labs.  Confirm visit date: 11/21/22 between 1-3 2.    Have you seen any providers since your last visit or any hospitalizations? 11/09/22 Dr. Johnsie Cancel (Cardio) no med changes 3.    Have there been any changes to your medications since your last visit? Unable to obtain RX fill hx 4.    Have you had any problems with getting your medications from your pharmacy? (Copay barriers) 5.    What is your top health concern you'd like to discuss for your upcoming visit? 6.     HC: Last Care Plan date: needs updated care plan    11/16/22 [facility] connected with pt, he could not take call, he was out of town. Pt states he will be back on Tuesday.  Total Time spent: 78min

## 2022-11-16 NOTE — Addendum Note (Signed)
Addended by: Josue Hector on: 11/16/2022 07:58 AM   Modules accepted: Orders

## 2022-11-19 ENCOUNTER — Other Ambulatory Visit: Payer: Self-pay | Admitting: Nurse Practitioner

## 2022-11-21 ENCOUNTER — Telehealth (HOSPITAL_COMMUNITY): Payer: Self-pay | Admitting: *Deleted

## 2022-11-21 ENCOUNTER — Ambulatory Visit: Payer: Self-pay | Admitting: Pharmacist

## 2022-11-21 DIAGNOSIS — E1159 Type 2 diabetes mellitus with other circulatory complications: Secondary | ICD-10-CM

## 2022-11-21 DIAGNOSIS — I2510 Atherosclerotic heart disease of native coronary artery without angina pectoris: Secondary | ICD-10-CM

## 2022-11-21 DIAGNOSIS — Z79899 Other long term (current) drug therapy: Secondary | ICD-10-CM

## 2022-11-21 NOTE — Progress Notes (Signed)
Focused Pharmacist Outreach     1. No medication changes to date; reports 'getting medications on time'. No cost related issues.   2. DM- denies low glucose readings, running about 150-180 or so. Able to get [name] ok at 3mg  dose via PAP.    3. Pain- no changes in pain level or severity at this time. Denies falls, constipation, chest pain, SOB, difficulty breathing.     CPP Telemedicine Visit Time: 6 min

## 2022-11-21 NOTE — Telephone Encounter (Signed)
Patient given detailed instructions per Myocardial Perfusion Study Information Sheet for the test on 11/28/22 Patient notified to arrive 15 minutes early and that it is imperative to arrive on time for appointment to keep from having the test rescheduled.  If you need to cancel or reschedule your appointment, please call the office within 24 hours of your appointment. . Patient verbalized understanding. Ricky Ala

## 2022-11-27 ENCOUNTER — Other Ambulatory Visit: Payer: Self-pay | Admitting: Internal Medicine

## 2022-11-28 ENCOUNTER — Ambulatory Visit (HOSPITAL_COMMUNITY): Payer: Medicare HMO | Attending: Cardiology

## 2022-11-28 DIAGNOSIS — R079 Chest pain, unspecified: Secondary | ICD-10-CM | POA: Diagnosis not present

## 2022-11-28 LAB — MYOCARDIAL PERFUSION IMAGING
LV dias vol: 103 mL (ref 62–150)
LV sys vol: 44 mL
Nuc Stress EF: 57 %
Peak HR: 88 {beats}/min
Rest HR: 61 {beats}/min
Rest Nuclear Isotope Dose: 10.1 mCi
SDS: 0
SRS: 0
SSS: 0
ST Depression (mm): 0 mm
Stress Nuclear Isotope Dose: 32.3 mCi
TID: 1.09

## 2022-11-28 MED ORDER — TECHNETIUM TC 99M TETROFOSMIN IV KIT: 32.3000 | PACK | Freq: Once | INTRAVENOUS | Status: AC | PRN

## 2022-11-28 MED ORDER — REGADENOSON 0.4 MG/5ML IV SOLN: 0.4000 mg | Freq: Once | INTRAVENOUS | Status: AC

## 2022-11-28 MED ORDER — TECHNETIUM TC 99M TETROFOSMIN IV KIT
10.1000 | PACK | Freq: Once | INTRAVENOUS | Status: AC | PRN
  Administered 2022-11-28: 10.1 via INTRAVENOUS

## 2022-11-30 ENCOUNTER — Ambulatory Visit: Payer: Medicare HMO | Admitting: Nurse Practitioner

## 2022-12-13 ENCOUNTER — Other Ambulatory Visit: Payer: Self-pay | Admitting: Nurse Practitioner

## 2022-12-13 DIAGNOSIS — N401 Enlarged prostate with lower urinary tract symptoms: Secondary | ICD-10-CM

## 2022-12-20 ENCOUNTER — Other Ambulatory Visit: Payer: Self-pay | Admitting: Nurse Practitioner

## 2022-12-20 DIAGNOSIS — G2581 Restless legs syndrome: Secondary | ICD-10-CM

## 2022-12-22 ENCOUNTER — Other Ambulatory Visit: Payer: Self-pay | Admitting: Internal Medicine

## 2022-12-22 DIAGNOSIS — E785 Hyperlipidemia, unspecified: Secondary | ICD-10-CM

## 2022-12-22 DIAGNOSIS — E1122 Type 2 diabetes mellitus with diabetic chronic kidney disease: Secondary | ICD-10-CM

## 2022-12-22 MED ORDER — METFORMIN HCL ER 500 MG PO TB24: ORAL_TABLET | ORAL | 3 refills | Status: DC

## 2023-01-01 ENCOUNTER — Other Ambulatory Visit: Payer: Self-pay | Admitting: Internal Medicine

## 2023-01-01 DIAGNOSIS — N320 Bladder-neck obstruction: Secondary | ICD-10-CM

## 2023-01-05 DIAGNOSIS — S63512A Sprain of carpal joint of left wrist, initial encounter: Secondary | ICD-10-CM | POA: Diagnosis not present

## 2023-01-05 DIAGNOSIS — M67832 Other specified disorders of synovium, left wrist: Secondary | ICD-10-CM | POA: Diagnosis not present

## 2023-01-16 ENCOUNTER — Telehealth: Payer: Self-pay | Admitting: Nurse Practitioner

## 2023-01-16 DIAGNOSIS — M1811 Unilateral primary osteoarthritis of first carpometacarpal joint, right hand: Secondary | ICD-10-CM | POA: Diagnosis not present

## 2023-01-16 NOTE — Progress Notes (Unsigned)
Annual Medicare Wellness Exam and FOLLOW UP  Assessment and Plan:  Encounter for Medicare Annual Wellness Exam Due Yearly Colonoscopy UTD, due 2025  Aortic atherosclerosis (HCC) Per CT 2020 Control blood pressure, cholesterol, glucose, increase exercise.   Coronary artery disease involving native heart with angina pectoris, unspecified vessel or lesion type (HCC) Control blood pressure, cholesterol, glucose, increase exercise.  Continue cardio follow up He stopped smoking 1 month ago  OSA and COPD overlap syndrome (HCC) Last sleep study showed only mild positional apnea, does not require CPAP May require nocturnal O2 in future Continue to follow with pulmonology  Chronic obstructive pulmonary disease, unspecified COPD type (HCC)/ Pulmonary Fibrosis/Interstitial lung disease Continue inhalers, has stopped smoking x 1 month Continue to follow with pulmonology - they order imaging  Hypertension Well controlled with current medications  Monitor blood pressure at home; patient to call if consistently greater than 130/80 Continue DASH diet.   Reminder to go to the ER if any CP, SOB, nausea, dizziness, severe HA, changes vision/speech, left arm numbness and tingling and jaw pain. - CBC, CMP  Hyperlipidemia associated with Type 2 Diabetes Mellitus Currently at goal; continue statin, discussed LDL goal <70 Continue low cholesterol diet and exercise.  Check lipid panel, CBC, CMP   Diabetes with other circulatory complications Continue medication: metformin, glimepiride 2 mg BID; farxiga was discontinued due to mycotic infections He would prefer to avoid insulin; did well with ozempic but cost concern - will try trulicity with lily support  Perform daily foot/skin check, notify office of any concerning changes.  Check A1C  Overweight with co morbidities  Long discussion about weight loss, diet, and exercise Recommended diet heavy in fruits and veggies and low in animal meats,  cheeses, and dairy products, appropriate calorie intake Discussed ideal weight for height and initial weight goal  Patient will work on increasing intentional exercise  Will follow up in 3 months  Vitamin D Def Continue supplement for goal 60-100;   Former Smoker Quit smoking 1 month ago  Midwife with pulmonology  Medication Management Continued    Continue diet and meds as discussed. Further disposition pending results of labs. Discussed med's effects and SE's.   Over 30 minutes of exam, counseling, chart review, and critical decision making was performed.   Future Appointments  Date Time Provider Department Center  07/03/2023 10:00 AM Raynelle Dick, NP [facility] None  01/21/2024 10:30 AM Raynelle Dick, NP [facility] None   Plan:   During the course of the visit the patient was educated and counseled about appropriate screening and preventive services including:   Pneumococcal vaccine  Prevnar 13 Influenza vaccine Td vaccine Screening electrocardiogram Bone densitometry screening Colorectal cancer screening Diabetes screening Glaucoma screening Nutrition counseling  Advanced directives: requested   ----------------------------------------------------------------------------------------------------------------------  HPI 72 y.o. male  presents for 3 month follow up on CAD, hypertension, cholesterol, diabetes, CKD, smoking, weight and vitamin D deficiency.   Osteoarthritis was determined on xray of right hand, using for Voltaren gel and heat.   He has quit smoking 05/2022 down from 1 pack/day for 40 years and consequently with COPD; he is prescribed BREO and has expressed significant benefit with this. Continues to follow with Pulmonology for COPD, interstitial lung disease and pulmonary fibrosis.   OSA, intolerant of CPAP and had been using mouth piece but he feels didn't work well; managed by Dr. Toni Arthurs. Last sleep study through Dr. Vickey Huger did show mild  apnea that is positional and does not require CPAP. Possible  need for nocturnal oxygen declines.    BMI is Body mass index is 27.2 kg/m., he has been working on diet, he is active at work, as a Music therapist.  Wt Readings from Last 3 Encounters:  01/17/23 189 lb 9.6 oz (86 kg)  11/09/22 187 lb 3.2 oz (84.9 kg)  10/31/22 180 lb (81.6 kg)   He has a history of CAD s/p PCI to RCA in 2005; He underwent LHC in 2006 after nuc showed inf ischemia; He underwent LHC in 04/18/11 which showed a proximal LAD 60% (unchanged from previous catheterization), distal LAD 65% (small caliber), Dx 60%, proximal CFX 20%, mid CFX 50%, OM1 30%, proximal RCA 20%, mid RCA stent patent, EF 65%. Medical therapy recommended. Follows with cardiology - Dr. Eden Emms and colleagues. Had recent repeat lexiscan in 07/2019 which was normal with EF 65%. He has aortic atherosclerosis per numerous CT scans.    His blood pressure has been controlled at home. Currently on Doxazosin 8 mg and ramipril 5 mg QD   today their BP is BP: (!) 102/58  BP Readings from Last 3 Encounters:  01/17/23 (!) 102/58  11/09/22 104/60  10/31/22 124/68  He does not workout. He denies chest pain, shortness of breath, dizziness.     He is on cholesterol medication (rosuvastatin 20 mg three days a week) and denies myalgias. His cholesterol is at goal. The cholesterol last visit was:   Lab Results  Component Value Date   CHOL 87 10/03/2022   HDL 46 10/03/2022   LDLCALC 27 10/03/2022   TRIG 62 10/03/2022   CHOLHDL 1.9 10/03/2022    He has been working on diet and exercise for T2DM (on metformin 500 mg daily, glimepiride 4 mg qhs, [name] 3mg ), and denies foot ulcerations, increased appetite, nausea, paresthesia of the feet, polydipsia, polyuria, visual disturbances, vomiting and weight loss. He does check sporadic fasting glucose, was 168 this AM,   He is on assistance program and has been out of 3 mg [name] and has been taking his left over 1.5  mg- needs to call company Last A1C in the office was:  Lab Results  Component Value Date   HGBA1C 6.7 (H) 10/03/2022   He has CKD I-II associated with T2DM monitored at this office:  Lab Results  Component Value Date   EGFR 94 10/03/2022    Patient is on Vitamin D supplement.   Lab Results  Component Value Date   VD25OH 72 06/29/2022         Current Medications:  Current Outpatient Medications on File Prior to Visit  Medication Sig   acetaminophen (TYLENOL) 500 MG tablet Take 2 tablets (1,000 mg total) by mouth every 6 (six) hours.   albuterol (VENTOLIN HFA) 108 (90 Base) MCG/ACT inhaler Inhale 2 puffs into the lungs every 6 (six) hours as needed for wheezing or shortness of breath.   aspirin EC 81 MG tablet Take 1 tablet (81 mg total) by mouth daily.   blood glucose meter kit and supplies Dispense based on patient and insurance preference. Use up to four times daily as directed. (FOR ICD-9 250.00, 250.01).   Cinnamon 500 MG TABS Take 1 tablet by mouth in the morning and at bedtime.   doxazosin (CARDURA) 8 MG tablet Take  1 tablet at Bedtime for BP & Prostate                                                                 /  TAKE                                                               BY                                                   MOUTH   Dulaglutide ([name]) 3 MG/0.5ML SOPN Inject 3 mg as directed once a week.   fexofenadine (ALLEGRA) 60 MG tablet Take 60 mg by mouth daily as needed for allergies or rhinitis.   finasteride (PROSCAR) 5 MG tablet TAKE 1 TABLET BY MOUTH ONCE DAILY FOR PROSTATE   fluticasone furoate-vilanterol (BREO ELLIPTA) 100-25 MCG/ACT AEPB Use 1 inhalation Daily for COPD / Asthma   glimepiride (AMARYL) 4 MG tablet TAKE 1/2-1 TABLET BY MOUTH daily with supper for diabetes   glucose blood test strip Test blood sugars once daily.Dx: E11.9   metFORMIN (GLUCOPHAGE-XR) 500 MG 24 hr tablet Take 2 tablets 2  x /day with Meals for Diabetes.   nitroGLYCERIN (NITROSTAT) 0.4 MG SL tablet Place 1 tablet (0.4 mg total) under the tongue every 5 (five) minutes as needed for chest pain.   pramipexole (MIRAPEX) 0.5 MG tablet TAKE 1 TABLET BY MOUTH AT BEDTIME   ramipril (ALTACE) 5 MG capsule TAKE 1 CAPSULE BY MOUTH ONCE DAILY FOR BLOOD PRESSURE AND DIABETIC KIDNEY PROTECTION   rosuvastatin (CRESTOR) 20 MG tablet TAKE 1 TABLET BY MOUTH THREE TIMES A WEEK FOR CHOLESTEROL   carbamide peroxide (DEBROX) 6.5 % OTIC solution Place 5 drops into the right ear 2 (two) times daily. (Patient not taking: Reported on 01/17/2023)   Current Facility-Administered Medications on File Prior to Visit  Medication   regadenoson (LEXISCAN) injection SOLN 0.4 mg   technetium tetrofosmin (TC-MYOVIEW) injection 32.3 millicurie   Eye Doctor - Dr Dione Booze 08/2022 Dentist- [facility] [place] [facility]  Allergies: No Known Allergies   Medical History:  Past Medical History:  Diagnosis Date   CAD (coronary artery disease)    a. s/p stent to RCA 2005; b. LHC 2006 after Nuc with inf ischemia: patent RCA stent, D1 80%, LAD 60% => med Rx;  c. LHC 04/18/11: Proximal LAD 60% (unchanged from previous catheterization), distal LAD 65% (small caliber), Dx 60%, proximal CFX 20%, mid CFX 50%, OM1 30%, proximal RCA 20%, mid RCA stent patent, EF 65%.=> med Rx   GERD (gastroesophageal reflux disease)    HLD (hyperlipidemia)    Hypertension    Obesity    OSA (obstructive sleep apnea)    has a cpap-does not use-has a special mouthpiece he wears at night   Type II or unspecified type diabetes mellitus without mention of complication, not stated as uncontrolled    Vitamin D deficiency    Wears glasses    Family history- Reviewed and unchanged Social history- Reviewed and unchanged  MEDICARE WELLNESS OBJECTIVES: Physical activity: Current Exercise Habits: The patient does not participate in regular exercise at present, Exercise limited by: respiratory  conditions(s) Cardiac risk factors: Cardiac Risk Factors include: advanced age (>56men, >35 women);dyslipidemia;hypertension;male gender;sedentary lifestyle;smoking/ tobacco exposure Depression/mood screen:      01/17/2023   10:50 AM  Depression  screen PHQ 2/9  Decreased Interest 0  Down, Depressed, Hopeless 0  PHQ - 2 Score 0    ADLs:     01/17/2023   10:50 AM 03/29/2022   10:55 AM  In your present state of health, do you have any difficulty performing the following activities:  Hearing? 0 0  Vision? 0 0  Difficulty concentrating or making decisions? 0 0  Walking or climbing stairs? 0 0  Dressing or bathing? 0 0  Doing errands, shopping? 0 0     Cognitive Testing  Alert? Yes  Normal Appearance?Yes  Oriented to person? Yes  Place? Yes   Time? Yes  Recall of three objects?  Yes  Can perform simple calculations? Yes  Displays appropriate judgment?Yes  Can read the correct time from a watch face?Yes  EOL planning: Does Patient Have a Medical Advance Directive?: Yes Type of Advance Directive: Healthcare Power of Attorney, Living will Does patient want to make changes to medical advance directive?: No - Patient declined Copy of Healthcare Power of Attorney in Chart?: No - copy requested    Review of Systems:  Review of Systems  Constitutional:  Negative for malaise/fatigue and weight loss.  HENT:  Negative for hearing loss and tinnitus.   Eyes:  Negative for blurred vision and double vision.  Respiratory:  Positive for cough. Negative for shortness of breath and wheezing.   Cardiovascular:  Negative for chest pain, palpitations, orthopnea, claudication and leg swelling.  Gastrointestinal:  Negative for abdominal pain, blood in stool, constipation, diarrhea, heartburn, melena, nausea and vomiting.  Genitourinary: Negative.   Musculoskeletal:  Positive for back pain (right sided sciatica) and joint pain (right hand, both knees). Negative for myalgias.  Skin:  Negative for rash.   Neurological:  Negative for dizziness, tingling, sensory change, weakness and headaches.  Endo/Heme/Allergies:  Negative for polydipsia.  Psychiatric/Behavioral: Negative.    All other systems reviewed and are negative.   Physical Exam: BP (!) 102/58   Pulse 73   Temp 97.7 F (36.5 C)   Ht 5\' 10"  (1.778 m)   Wt 189 lb 9.6 oz (86 kg)   SpO2 95%   BMI 27.20 kg/m  Wt Readings from Last 3 Encounters:  01/17/23 189 lb 9.6 oz (86 kg)  11/09/22 187 lb 3.2 oz (84.9 kg)  10/31/22 180 lb (81.6 kg)   General Appearance: Well nourished, in no apparent distress. Eyes: PERRLA, EOMs, conjunctiva no swelling or erythema Sinuses: No Frontal/maxillary tenderness ENT/Mouth: Ext aud canals clear, TM impacted with cerumen bilaterally. No erythema, swelling, or exudate on post pharynx. Hearing normal.  Neck: Supple, thyroid normal.  Respiratory: Respiratory effort normal, BS equal bilaterally without rales, rhonchi, wheezing or stridor.  Cardio: RRR with no MRGs. Brisk peripheral pulses without edema.  Abdomen: Soft, + BS.  Non tender, no guarding, rebound, hernias, masses. Lymphatics: Non tender without lymphadenopathy.  Musculoskeletal: Full ROM, 5/5 strength, Normal gait Skin: Warm, dry without rashes, lesions, ecchymosis.  Neuro: Cranial nerves intact. No cerebellar symptoms.  Psych: Awake and oriented X 3, normal affect, Insight and Judgment appropriate.    Medicare Attestation I have personally reviewed: The patient's medical and social history Their use of alcohol, tobacco or illicit drugs Their current medications and supplements The patient's functional ability including ADLs,fall risks, home safety risks, cognitive, and hearing and visual impairment Diet and physical activities Evidence for depression or mood disorders  The patient's weight, height, BMI, and visual acuity have been recorded in the chart.  I have  made referrals, counseling, and provided education to the patient  based on review of the above and I have provided the patient with a written personalized care plan for preventive services.     Raynelle Dick, NP 10:56 AM [facility]

## 2023-01-16 NOTE — Telephone Encounter (Signed)
Pt. Requesting refill on [name]

## 2023-01-17 ENCOUNTER — Encounter: Payer: Self-pay | Admitting: Nurse Practitioner

## 2023-01-17 ENCOUNTER — Ambulatory Visit (INDEPENDENT_AMBULATORY_CARE_PROVIDER_SITE_OTHER): Payer: Medicare HMO | Admitting: Nurse Practitioner

## 2023-01-17 VITALS — BP 102/58 | HR 73 | Temp 97.7°F | Ht 70.0 in | Wt 189.6 lb

## 2023-01-17 DIAGNOSIS — J849 Interstitial pulmonary disease, unspecified: Secondary | ICD-10-CM | POA: Diagnosis not present

## 2023-01-17 DIAGNOSIS — E1169 Type 2 diabetes mellitus with other specified complication: Secondary | ICD-10-CM

## 2023-01-17 DIAGNOSIS — Z0001 Encounter for general adult medical examination with abnormal findings: Secondary | ICD-10-CM

## 2023-01-17 DIAGNOSIS — E1159 Type 2 diabetes mellitus with other circulatory complications: Secondary | ICD-10-CM | POA: Diagnosis not present

## 2023-01-17 DIAGNOSIS — G4733 Obstructive sleep apnea (adult) (pediatric): Secondary | ICD-10-CM | POA: Diagnosis not present

## 2023-01-17 DIAGNOSIS — J8410 Pulmonary fibrosis, unspecified: Secondary | ICD-10-CM | POA: Diagnosis not present

## 2023-01-17 DIAGNOSIS — Z0000 Encounter for general adult medical examination without abnormal findings: Secondary | ICD-10-CM

## 2023-01-17 DIAGNOSIS — E1122 Type 2 diabetes mellitus with diabetic chronic kidney disease: Secondary | ICD-10-CM | POA: Diagnosis not present

## 2023-01-17 DIAGNOSIS — R6889 Other general symptoms and signs: Secondary | ICD-10-CM | POA: Diagnosis not present

## 2023-01-17 DIAGNOSIS — J449 Chronic obstructive pulmonary disease, unspecified: Secondary | ICD-10-CM

## 2023-01-17 DIAGNOSIS — E785 Hyperlipidemia, unspecified: Secondary | ICD-10-CM | POA: Diagnosis not present

## 2023-01-17 DIAGNOSIS — N182 Chronic kidney disease, stage 2 (mild): Secondary | ICD-10-CM

## 2023-01-17 DIAGNOSIS — Z79899 Other long term (current) drug therapy: Secondary | ICD-10-CM | POA: Diagnosis not present

## 2023-01-17 DIAGNOSIS — Z87891 Personal history of nicotine dependence: Secondary | ICD-10-CM

## 2023-01-17 DIAGNOSIS — I10 Essential (primary) hypertension: Secondary | ICD-10-CM

## 2023-01-17 DIAGNOSIS — I700 Atherosclerosis of aorta: Secondary | ICD-10-CM

## 2023-01-17 DIAGNOSIS — I25118 Atherosclerotic heart disease of native coronary artery with other forms of angina pectoris: Secondary | ICD-10-CM | POA: Diagnosis not present

## 2023-01-17 DIAGNOSIS — E559 Vitamin D deficiency, unspecified: Secondary | ICD-10-CM

## 2023-01-17 MED ORDER — METFORMIN HCL ER 500 MG PO TB24: ORAL_TABLET | ORAL | 3 refills | Status: DC

## 2023-01-17 NOTE — Patient Instructions (Signed)

## 2023-01-17 NOTE — Telephone Encounter (Signed)
Patient going to call [name] to see why they haven't sent any.

## 2023-01-18 LAB — COMPLETE METABOLIC PANEL WITH GFR
AG Ratio: 1.9 (calc) (ref 1.0–2.5)
ALT: 18 U/L (ref 9–46)
AST: 12 U/L (ref 10–35)
Albumin: 4.2 g/dL (ref 3.6–5.1)
Alkaline phosphatase (APISO): 88 U/L (ref 35–144)
BUN/Creatinine Ratio: 19 (calc) (ref 6–22)
BUN: 13 mg/dL (ref 7–25)
CO2: 25 mmol/L (ref 20–32)
Calcium: 8.9 mg/dL (ref 8.6–10.3)
Chloride: 104 mmol/L (ref 98–110)
Creat: 0.68 mg/dL — ABNORMAL LOW (ref 0.70–1.28)
Globulin: 2.2 g/dL (calc) (ref 1.9–3.7)
Glucose, Bld: 167 mg/dL — ABNORMAL HIGH (ref 65–99)
Potassium: 4.4 mmol/L (ref 3.5–5.3)
Sodium: 136 mmol/L (ref 135–146)
Total Bilirubin: 0.5 mg/dL (ref 0.2–1.2)
Total Protein: 6.4 g/dL (ref 6.1–8.1)
eGFR: 99 mL/min/{1.73_m2} (ref >=60)

## 2023-01-18 LAB — HEMOGLOBIN A1C
Hgb A1c MFr Bld: 7.2 % of total Hgb — ABNORMAL HIGH (ref <5.7)
Mean Plasma Glucose: 160 mg/dL
eAG (mmol/L): 8.9 mmol/L

## 2023-01-18 LAB — CBC WITH DIFFERENTIAL/PLATELET
Absolute Monocytes: 701 cells/uL (ref 200–950)
Basophils Absolute: 36 cells/uL (ref 0–200)
Basophils Relative: 0.4 %
Eosinophils Absolute: 309 cells/uL (ref 15–500)
Eosinophils Relative: 3.4 %
HCT: 41.9 % (ref 38.5–50.0)
Hemoglobin: 14.3 g/dL (ref 13.2–17.1)
Lymphs Abs: 1957 cells/uL (ref 850–3900)
MCH: 31.6 pg (ref 27.0–33.0)
MCHC: 34.1 g/dL (ref 32.0–36.0)
MCV: 92.7 fL (ref 80.0–100.0)
MPV: 11.8 fL (ref 7.5–12.5)
Monocytes Relative: 7.7 %
Neutro Abs: 6097 cells/uL (ref 1500–7800)
Neutrophils Relative %: 67 %
Platelets: 190 10*3/uL (ref 140–400)
RBC: 4.52 10*6/uL (ref 4.20–5.80)
RDW: 12.6 % (ref 11.0–15.0)
Total Lymphocyte: 21.5 %
WBC: 9.1 10*3/uL (ref 3.8–10.8)

## 2023-01-18 LAB — LIPID PANEL
Cholesterol: 106 mg/dL (ref <200)
HDL: 49 mg/dL (ref >=40)
LDL Cholesterol (Calc): 43 mg/dL (calc)
Non-HDL Cholesterol (Calc): 57 mg/dL (calc) (ref <130)
Total CHOL/HDL Ratio: 2.2 (calc) (ref <5.0)
Triglycerides: 60 mg/dL (ref <150)

## 2023-02-14 ENCOUNTER — Other Ambulatory Visit: Payer: Self-pay | Admitting: Nurse Practitioner

## 2023-03-14 ENCOUNTER — Other Ambulatory Visit: Payer: Self-pay | Admitting: Nurse Practitioner

## 2023-03-14 DIAGNOSIS — N401 Enlarged prostate with lower urinary tract symptoms: Secondary | ICD-10-CM

## 2023-03-17 ENCOUNTER — Other Ambulatory Visit: Payer: Self-pay | Admitting: Nurse Practitioner

## 2023-03-17 DIAGNOSIS — G2581 Restless legs syndrome: Secondary | ICD-10-CM

## 2023-03-23 ENCOUNTER — Other Ambulatory Visit: Payer: Self-pay | Admitting: Nurse Practitioner

## 2023-03-29 ENCOUNTER — Other Ambulatory Visit: Payer: Self-pay | Admitting: Nurse Practitioner

## 2023-04-23 DIAGNOSIS — J069 Acute upper respiratory infection, unspecified: Secondary | ICD-10-CM | POA: Diagnosis not present

## 2023-04-23 DIAGNOSIS — R051 Acute cough: Secondary | ICD-10-CM | POA: Diagnosis not present

## 2023-04-23 DIAGNOSIS — Z20822 Contact with and (suspected) exposure to covid-19: Secondary | ICD-10-CM | POA: Diagnosis not present

## 2023-04-23 DIAGNOSIS — R519 Headache, unspecified: Secondary | ICD-10-CM | POA: Diagnosis not present

## 2023-04-23 NOTE — Progress Notes (Deleted)
3 MONTH  FOLLOW UP  Assessment and Plan:    Aortic atherosclerosis (HCC) Per CT 2020 Control blood pressure, cholesterol, glucose, increase exercise.   Coronary artery disease involving native heart with angina pectoris, unspecified vessel or lesion type (HCC) Control blood pressure, cholesterol, glucose, increase exercise.  Continue cardio follow up He stopped smoking 1 month ago  OSA and COPD overlap syndrome (HCC) Last sleep study showed only mild positional apnea, does not require CPAP May require nocturnal O2 in future Continue to follow with pulmonology  Chronic obstructive pulmonary disease, unspecified COPD type (HCC)/ Pulmonary Fibrosis/Interstitial lung disease Continue inhalers, has stopped smoking x 1 month Continue to follow with pulmonology - they order imaging  Hypertension Well controlled with current medications  Monitor blood pressure at home; patient to call if consistently greater than 130/80 Continue DASH diet.   Reminder to go to the ER if any CP, SOB, nausea, dizziness, severe HA, changes vision/speech, left arm numbness and tingling and jaw pain. - CBC, CMP  Hyperlipidemia associated with Type 2 Diabetes Mellitus Currently at goal; continue statin, discussed LDL goal <70 Continue low cholesterol diet and exercise.  Check lipid panel, CBC, CMP   Diabetes with other circulatory complications Continue medication: metformin, glimepiride 2 mg BID; farxiga was discontinued due to mycotic infections He would prefer to avoid insulin; did well with ozempic but cost concern - will try trulicity with lily support  Perform daily foot/skin check, notify office of any concerning changes.  Check A1C  Overweight with co morbidities  Long discussion about weight loss, diet, and exercise Recommended diet heavy in fruits and veggies and low in animal meats, cheeses, and dairy products, appropriate calorie intake Discussed ideal weight for height and initial weight  goal  Patient will work on increasing intentional exercise  Will follow up in 3 months  Vitamin D Def Continue supplement for goal 60-100;   Former Smoker Quit smoking 1 month ago  Midwife with pulmonology  Medication Management Continued    Continue diet and meds as discussed. Further disposition pending results of labs. Discussed med's effects and SE's.   Over 30 minutes of exam, counseling, chart review, and critical decision making was performed.   Future Appointments  Date Time Provider Department Center  04/24/2023 10:30 AM Raynelle Dick, NP [facility] None  07/25/2023 10:00 AM Raynelle Dick, NP [facility] None  01/21/2024 10:30 AM Raynelle Dick, NP [facility] None   Plan:   During the course of the visit the patient was educated and counseled about appropriate screening and preventive services including:   Pneumococcal vaccine  Prevnar 13 Influenza vaccine Td vaccine Screening electrocardiogram Bone densitometry screening Colorectal cancer screening Diabetes screening Glaucoma screening Nutrition counseling  Advanced directives: requested   ----------------------------------------------------------------------------------------------------------------------  HPI 72 y.o. male  presents for 3 month follow up on CAD, hypertension, cholesterol, diabetes, CKD, smoking, weight and vitamin D deficiency.   Osteoarthritis was determined on xray of right hand, using for Voltaren gel and heat.   He has quit smoking 05/2022 down from 1 pack/day for 40 years and consequently with COPD; he is prescribed BREO and has expressed significant benefit with this. Continues to follow with Pulmonology for COPD, interstitial lung disease and pulmonary fibrosis.   OSA, intolerant of CPAP and had been using mouth piece but he feels didn't work well; managed by Dr. Toni Arthurs. Last sleep study through Dr. Vickey Huger did show mild apnea that is positional and does not require CPAP.  Possible need for nocturnal  oxygen declines.    BMI is There is no height or weight on file to calculate BMI., he has been working on diet, he is active at work, as a Music therapist.  Wt Readings from Last 3 Encounters:  01/17/23 189 lb 9.6 oz (86 kg)  11/09/22 187 lb 3.2 oz (84.9 kg)  10/31/22 180 lb (81.6 kg)   He has a history of CAD s/p PCI to RCA in 2005; He underwent LHC in 2006 after nuc showed inf ischemia; He underwent LHC in 04/18/11 which showed a proximal LAD 60% (unchanged from previous catheterization), distal LAD 65% (small caliber), Dx 60%, proximal CFX 20%, mid CFX 50%, OM1 30%, proximal RCA 20%, mid RCA stent patent, EF 65%. Medical therapy recommended. Follows with cardiology - Dr. Eden Emms and colleagues. Had recent repeat lexiscan in 07/2019 which was normal with EF 65%. He has aortic atherosclerosis per numerous CT scans.    His blood pressure has been controlled at home. Currently on Doxazosin 8 mg and ramipril 5 mg QD   today their BP is    BP Readings from Last 3 Encounters:  01/17/23 (!) 102/58  11/09/22 104/60  10/31/22 124/68  He does not workout. He denies chest pain, shortness of breath, dizziness.     He is on cholesterol medication (rosuvastatin 20 mg three days a week) and denies myalgias. His cholesterol is at goal. The cholesterol last visit was:   Lab Results  Component Value Date   CHOL 106 01/17/2023   HDL 49 01/17/2023   LDLCALC 43 01/17/2023   TRIG 60 01/17/2023   CHOLHDL 2.2 01/17/2023    He has been working on diet and exercise for T2DM (on metformin 500 mg daily, glimepiride 4 mg qhs, [name] 3mg ), and denies foot ulcerations, increased appetite, nausea, paresthesia of the feet, polydipsia, polyuria, visual disturbances, vomiting and weight loss. He does check sporadic fasting glucose, was 168 this AM,   He is on assistance program and has been out of 3 mg [name] and has been taking his left over 1.5 mg- needs to call company Last A1C in the  office was:  Lab Results  Component Value Date   HGBA1C 7.2 (H) 01/17/2023   He has CKD I-II associated with T2DM monitored at this office:  Lab Results  Component Value Date   EGFR 99 01/17/2023    Patient is on Vitamin D supplement.   Lab Results  Component Value Date   VD25OH 72 06/29/2022         Current Medications:  Current Outpatient Medications on File Prior to Visit  Medication Sig   acetaminophen (TYLENOL) 500 MG tablet Take 2 tablets (1,000 mg total) by mouth every 6 (six) hours.   albuterol (VENTOLIN HFA) 108 (90 Base) MCG/ACT inhaler Inhale 2 puffs into the lungs every 6 (six) hours as needed for wheezing or shortness of breath.   aspirin EC 81 MG tablet Take 1 tablet (81 mg total) by mouth daily.   blood glucose meter kit and supplies Dispense based on patient and insurance preference. Use up to four times daily as directed. (FOR ICD-9 250.00, 250.01).   Cinnamon 500 MG TABS Take 1 tablet by mouth in the morning and at bedtime.   doxazosin (CARDURA) 8 MG tablet Take  1 tablet at Bedtime for BP & Prostate                                                                 /  TAKE                                                               BY                                                   MOUTH   Dulaglutide ([name]) 3 MG/0.5ML SOPN Inject 3 mg as directed once a week.   fexofenadine (ALLEGRA) 60 MG tablet Take 60 mg by mouth daily as needed for allergies or rhinitis.   finasteride (PROSCAR) 5 MG tablet TAKE 1 TABLET BY MOUTH ONCE DAILY FOR PROSTATE   fluticasone furoate-vilanterol (BREO ELLIPTA) 100-25 MCG/ACT AEPB Use 1 inhalation Daily for COPD / Asthma   glimepiride (AMARYL) 4 MG tablet TAKE 1/2-1 TABLET BY MOUTH daily with supper for diabetes   glucose blood test strip Test blood sugars once daily.Dx: E11.9   metFORMIN (GLUCOPHAGE-XR) 500 MG 24 hr tablet Take 1 tablets daily with meal   nitroGLYCERIN (NITROSTAT)  0.4 MG SL tablet Place 1 tablet (0.4 mg total) under the tongue every 5 (five) minutes as needed for chest pain.   pramipexole (MIRAPEX) 0.5 MG tablet TAKE 1 TABLET BY MOUTH ONCE DAILY AT BEDTIME   ramipril (ALTACE) 5 MG capsule TAKE 1 CAPSULE BY MOUTH ONCE DAILY FOR  BLOOD  PRESSURE  AND  DIABETIC  KIDNEY  PROTECTION   rosuvastatin (CRESTOR) 20 MG tablet TAKE 1 TABLET BY MOUTH THREE TIMES A WEEK FOR CHOLESTEROL   Current Facility-Administered Medications on File Prior to Visit  Medication   regadenoson (LEXISCAN) injection SOLN 0.4 mg   technetium tetrofosmin (TC-MYOVIEW) injection 32.3 millicurie   Eye Doctor - Dr Dione Booze 08/2022 Dentist- [facility]  Allergies: No Known Allergies   Medical History:  Past Medical History:  Diagnosis Date   CAD (coronary artery disease)    a. s/p stent to RCA 2005; b. LHC 2006 after Nuc with inf ischemia: patent RCA stent, D1 80%, LAD 60% => med Rx;  c. LHC 04/18/11: Proximal LAD 60% (unchanged from previous catheterization), distal LAD 65% (small caliber), Dx 60%, proximal CFX 20%, mid CFX 50%, OM1 30%, proximal RCA 20%, mid RCA stent patent, EF 65%.=> med Rx   GERD (gastroesophageal reflux disease)    HLD (hyperlipidemia)    Hypertension    Obesity    OSA (obstructive sleep apnea)    has a cpap-does not use-has a special mouthpiece he wears at night   Type II or unspecified type diabetes mellitus without mention of complication, not stated as uncontrolled    Vitamin D deficiency    Wears glasses    Family history- Reviewed and unchanged Social history- Reviewed and unchanged  MEDICARE WELLNESS OBJECTIVES: Physical activity:   Cardiac risk factors:   Depression/mood screen:      01/17/2023   10:50 AM  Depression screen PHQ 2/9  Decreased Interest 0  Down, Depressed, Hopeless 0  PHQ - 2 Score 0    ADLs:     01/17/2023   10:50 AM  In your present state of health, do you have any difficulty performing the following activities:   Hearing?  0  Vision? 0  Difficulty concentrating or making decisions? 0  Walking or climbing stairs? 0  Dressing or bathing? 0  Doing errands, shopping? 0     Cognitive Testing  Alert? Yes  Normal Appearance?Yes  Oriented to person? Yes  Place? Yes   Time? Yes  Recall of three objects?  Yes  Can perform simple calculations? Yes  Displays appropriate judgment?Yes  Can read the correct time from a watch face?Yes  EOL planning:      Review of Systems:  Review of Systems  Constitutional:  Negative for malaise/fatigue and weight loss.  HENT:  Negative for hearing loss and tinnitus.   Eyes:  Negative for blurred vision and double vision.  Respiratory:  Positive for cough. Negative for shortness of breath and wheezing.   Cardiovascular:  Negative for chest pain, palpitations, orthopnea, claudication and leg swelling.  Gastrointestinal:  Negative for abdominal pain, blood in stool, constipation, diarrhea, heartburn, melena, nausea and vomiting.  Genitourinary: Negative.   Musculoskeletal:  Positive for back pain (right sided sciatica) and joint pain (right hand, both knees). Negative for myalgias.  Skin:  Negative for rash.  Neurological:  Negative for dizziness, tingling, sensory change, weakness and headaches.  Endo/Heme/Allergies:  Negative for polydipsia.  Psychiatric/Behavioral: Negative.    All other systems reviewed and are negative.   Physical Exam: There were no vitals taken for this visit. Wt Readings from Last 3 Encounters:  01/17/23 189 lb 9.6 oz (86 kg)  11/09/22 187 lb 3.2 oz (84.9 kg)  10/31/22 180 lb (81.6 kg)   General Appearance: Well nourished, in no apparent distress. Eyes: PERRLA, EOMs, conjunctiva no swelling or erythema Sinuses: No Frontal/maxillary tenderness ENT/Mouth: Ext aud canals clear, TM impacted with cerumen bilaterally. No erythema, swelling, or exudate on post pharynx. Hearing normal.  Neck: Supple, thyroid normal.  Respiratory: Respiratory  effort normal, BS equal bilaterally without rales, rhonchi, wheezing or stridor.  Cardio: RRR with no MRGs. Brisk peripheral pulses without edema.  Abdomen: Soft, + BS.  Non tender, no guarding, rebound, hernias, masses. Lymphatics: Non tender without lymphadenopathy.  Musculoskeletal: Full ROM, 5/5 strength, Normal gait Skin: Warm, dry without rashes, lesions, ecchymosis.  Neuro: Cranial nerves intact. No cerebellar symptoms.  Psych: Awake and oriented X 3, normal affect, Insight and Judgment appropriate.    Medicare Attestation I have personally reviewed: The patient's medical and social history Their use of alcohol, tobacco or illicit drugs Their current medications and supplements The patient's functional ability including ADLs,fall risks, home safety risks, cognitive, and hearing and visual impairment Diet and physical activities Evidence for depression or mood disorders  The patient's weight, height, BMI, and visual acuity have been recorded in the chart.  I have made referrals, counseling, and provided education to the patient based on review of the above and I have provided the patient with a written personalized care plan for preventive services.     Raynelle Dick, NP 12:54 PM [facility]

## 2023-04-24 ENCOUNTER — Ambulatory Visit: Payer: Medicare HMO | Admitting: Nurse Practitioner

## 2023-04-30 NOTE — Progress Notes (Unsigned)
3 MONTH  FOLLOW UP  Assessment and Plan:    Aortic atherosclerosis (HCC) Per CT 2020 Control blood pressure, cholesterol, glucose, increase exercise.   Coronary artery disease involving native heart with angina pectoris, unspecified vessel or lesion type (HCC) Control blood pressure, cholesterol, glucose, increase exercise.  Continue cardio follow up He stopped smoking 1 month ago  OSA and COPD overlap syndrome (HCC) Last sleep study showed only mild positional apnea, does not require CPAP May require nocturnal O2 in future Continue to follow with pulmonology  Chronic obstructive pulmonary disease, unspecified COPD type (HCC)/ Pulmonary Fibrosis/Interstitial lung disease Continue inhalers, has stopped smoking x 1 month Continue to follow with pulmonology - they order imaging  Hypertension Well controlled with current medications  Monitor blood pressure at home; patient to call if consistently greater than 130/80 Continue DASH diet.   Reminder to go to the ER if any CP, SOB, nausea, dizziness, severe HA, changes vision/speech, left arm numbness and tingling and jaw pain. 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  Perform daily foot/skin check, notify office of any concerning changes.  Check A1C   Hyperlipidemia associated with Type 2 Diabetes Mellitus Currently at goal; continue Crestor 20 mg three days a week, discussed LDL goal <70 Continue low cholesterol diet and exercise.  Check lipid panel, CBC, CMP   Diabetes with other circulatory complications Continue medication: metformin, glimepiride 2 mg BID; farxiga was discontinued due to mycotic infections He would prefer to avoid insulin; Continue [name] Perform daily foot/skin check, notify office of any concerning changes.  Check A1C  Overweight with co morbidities  Long discussion  about weight loss, diet, and exercise Recommended diet heavy in fruits and veggies and low in animal meats, cheeses, and dairy products, appropriate calorie intake Discussed ideal weight for height and initial weight goal  Patient will work on increasing intentional exercise  Will follow up in 3 months  Vitamin D Def Continue supplement for goal 60-100;   Smoker He has restarted smoking again, states less than a pack Follows with pulmonology  Medication Management Continued  Restless Legs Plans to stop medication as it is not helping  Cerumen impaction bilaterally Declines irrigation due to cost.    Continue diet and meds as discussed. Further disposition pending results of labs. Discussed med's effects and SE's.   Over 30 minutes of exam, counseling, chart review, and critical decision making was performed.   Future Appointments  Date Time Provider Department Center  07/25/2023 10:00 AM Raynelle Dick, NP [facility] None  01/21/2024 10:30 AM Raynelle Dick, NP [facility] None    ----------------------------------------------------------------------------------------------------------------------  HPI 72 y.o. male  presents for 3 month follow up on CAD, hypertension, cholesterol, diabetes, CKD, smoking, weight and vitamin D deficiency.   Osteoarthritis was determined on xray of right hand, using for Voltaren gel and heat.   His son had a stroke 04/04/2023- paralysis of left side, nonverbal , trach put in place. He is under a lot of stress.   He does have restless leg syndrome and had been using Mirapex 0.5 mg every day and states it is no longer working.  Has also tried Requip and Gabapentin in the past. Had normal Lower extremity ABI 2021. He does think he will stop taking the medication. He has trouble falling asleep, does not wake him up.   He has restarted smoking again,  states less than a pack a day. He has smoked X 40 years and consequently with COPD; he is  prescribed BREO and has expressed significant benefit with this. Continues to follow with Pulmonology for COPD, interstitial lung disease and pulmonary fibrosis. He follows with pulmonology. States he has not had to use his rescue inhaler.   OSA, intolerant of CPAP and had been using mouth piece but he feels didn't work well; managed by Dr. Toni Arthurs. Last sleep study through Dr. Vickey Huger did show mild apnea that is positional and does not require CPAP. Possible need for nocturnal oxygen declines.    BMI is Body mass index is 28.5 kg/m., he has been working on diet, he is active at work, as a Music therapist.  Wt Readings from Last 3 Encounters:  05/01/23 193 lb (87.5 kg)  01/17/23 189 lb 9.6 oz (86 kg)  11/09/22 187 lb 3.2 oz (84.9 kg)   He has a history of CAD s/p PCI to RCA in 2005; He underwent LHC in 2006 after nuc showed inf ischemia; He underwent LHC in 04/18/11 which showed a proximal LAD 60% (unchanged from previous catheterization), distal LAD 65% (small caliber), Dx 60%, proximal CFX 20%, mid CFX 50%, OM1 30%, proximal RCA 20%, mid RCA stent patent, EF 65%. Medical therapy recommended. Follows with cardiology - Dr. Eden Emms and colleagues. Had recent repeat lexiscan in 07/2019 which was normal with EF 65%. He has aortic atherosclerosis per numerous CT scans.    His blood pressure has been controlled at home. Currently on Doxazosin 8 mg and ramipril 5 mg QD   today their BP is BP: 116/60  BP Readings from Last 3 Encounters:  05/01/23 116/60  01/17/23 (!) 102/58  11/09/22 104/60  He does not workout. He denies chest pain, shortness of breath, dizziness.     He is on cholesterol medication (rosuvastatin 20 mg three days a week) and denies myalgias. His cholesterol is at goal. The cholesterol last visit was:   Lab Results  Component Value Date   CHOL 106 01/17/2023   HDL 49 01/17/2023   LDLCALC 43 01/17/2023   TRIG 60 01/17/2023   CHOLHDL 2.2 01/17/2023    He has been working on diet  and exercise for T2DM (on metformin 500 mg daily, glimepiride 4 mg qhs, [name] 3mg ), and denies foot ulcerations, increased appetite, nausea, paresthesia of the feet, polydipsia, polyuria, visual disturbances, vomiting and weight loss. He does check sporadic fasting glucose, was 168 this AM,   Last A1C in the office was:  Lab Results  Component Value Date   HGBA1C 7.2 (H) 01/17/2023   He has CKD I-II associated with T2DM monitored at this office:  Lab Results  Component Value Date   EGFR 99 01/17/2023    Patient is on Vitamin D supplement.   Lab Results  Component Value Date   VD25OH 72 06/29/2022         Current Medications:  Current Outpatient Medications on File Prior to Visit  Medication Sig   acetaminophen (TYLENOL) 500 MG tablet Take 2 tablets (1,000 mg total) by mouth every 6 (six) hours.   albuterol (VENTOLIN HFA) 108 (90 Base) MCG/ACT inhaler Inhale 2 puffs into the lungs every 6 (six) hours as needed for wheezing or shortness of breath.   aspirin EC 81 MG tablet Take 1 tablet (81 mg total) by mouth daily.   blood glucose meter kit and supplies Dispense based on patient and insurance preference. Use up to four times daily as  directed. (FOR ICD-9 250.00, 250.01).   Cinnamon 500 MG TABS Take 1 tablet by mouth in the morning and at bedtime.   doxazosin (CARDURA) 8 MG tablet Take  1 tablet at Bedtime for BP & Prostate                                                                 /                                                    TAKE                                                               BY                                                   MOUTH   Dulaglutide ([name]) 3 MG/0.5ML SOPN Inject 3 mg as directed once a week.   fexofenadine (ALLEGRA) 60 MG tablet Take 60 mg by mouth daily as needed for allergies or rhinitis.   finasteride (PROSCAR) 5 MG tablet TAKE 1 TABLET BY MOUTH ONCE DAILY FOR PROSTATE   fluticasone furoate-vilanterol (BREO ELLIPTA) 100-25 MCG/ACT  AEPB Use 1 inhalation Daily for COPD / Asthma   glimepiride (AMARYL) 4 MG tablet TAKE 1/2-1 TABLET BY MOUTH daily with supper for diabetes   glucose blood test strip Test blood sugars once daily.Dx: E11.9   metFORMIN (GLUCOPHAGE-XR) 500 MG 24 hr tablet Take 1 tablets daily with meal   nitroGLYCERIN (NITROSTAT) 0.4 MG SL tablet Place 1 tablet (0.4 mg total) under the tongue every 5 (five) minutes as needed for chest pain.   pramipexole (MIRAPEX) 0.5 MG tablet TAKE 1 TABLET BY MOUTH ONCE DAILY AT BEDTIME   ramipril (ALTACE) 5 MG capsule TAKE 1 CAPSULE BY MOUTH ONCE DAILY FOR  BLOOD  PRESSURE  AND  DIABETIC  KIDNEY  PROTECTION   rosuvastatin (CRESTOR) 20 MG tablet TAKE 1 TABLET BY MOUTH THREE TIMES A WEEK FOR CHOLESTEROL   Current Facility-Administered Medications on File Prior to Visit  Medication   regadenoson (LEXISCAN) injection SOLN 0.4 mg   technetium tetrofosmin (TC-MYOVIEW) injection 32.3 millicurie   Eye Doctor - Dr Dione Booze 08/2022 Dentist- [facility]  Allergies: No Known Allergies   Medical History:  Past Medical History:  Diagnosis Date   CAD (coronary artery disease)    a. s/p stent to RCA 2005; b. LHC 2006 after Nuc with inf ischemia: patent RCA stent, D1 80%, LAD 60% => med Rx;  c. LHC 04/18/11: Proximal LAD 60% (unchanged from previous catheterization), distal LAD 65% (small caliber), Dx 60%, proximal CFX 20%, mid CFX 50%, OM1 30%, proximal RCA 20%, mid RCA stent patent, EF 65%.=> med Rx   GERD (gastroesophageal  reflux disease)    HLD (hyperlipidemia)    Hypertension    Obesity    OSA (obstructive sleep apnea)    has a cpap-does not use-has a special mouthpiece he wears at night   Type II or unspecified type diabetes mellitus without mention of complication, not stated as uncontrolled    Vitamin D deficiency    Wears glasses    Family history- Reviewed and unchanged Social history- Reviewed and unchanged    Review of Systems:  Review of Systems   Constitutional:  Negative for malaise/fatigue and weight loss.  HENT:  Negative for hearing loss and tinnitus.   Eyes:  Negative for blurred vision and double vision.  Respiratory:  Positive for cough. Negative for shortness of breath and wheezing.   Cardiovascular:  Negative for chest pain, palpitations, orthopnea, claudication and leg swelling.  Gastrointestinal:  Negative for abdominal pain, blood in stool, constipation, diarrhea, heartburn, melena, nausea and vomiting.  Genitourinary: Negative.   Musculoskeletal:  Positive for back pain (right sided sciatica) and joint pain (right hand, both knees). Negative for myalgias.  Skin:  Negative for rash.  Neurological:  Negative for dizziness, tingling, sensory change, weakness and headaches.  Endo/Heme/Allergies:  Negative for polydipsia.  Psychiatric/Behavioral: Negative.    All other systems reviewed and are negative.   Physical Exam: BP 116/60   Pulse 70   Temp 97.7 F (36.5 C)   Ht 5\' 9"  (1.753 m)   Wt 193 lb (87.5 kg)   SpO2 95%   BMI 28.50 kg/m  Wt Readings from Last 3 Encounters:  05/01/23 193 lb (87.5 kg)  01/17/23 189 lb 9.6 oz (86 kg)  11/09/22 187 lb 3.2 oz (84.9 kg)   General Appearance: Well nourished, in no apparent distress. Eyes: PERRLA, EOMs, conjunctiva no swelling or erythema Sinuses: No Frontal/maxillary tenderness ENT/Mouth: Ext aud canals clear, TM impacted with cerumen bilaterally- declines irrigation. No erythema, swelling, or exudate on post pharynx. Hearing normal.  Neck: Supple, thyroid normal.  Respiratory: Respiratory effort normal, BS equal bilaterally without rales, rhonchi, wheezing or stridor.  Cardio: RRR with no MRGs. Brisk peripheral pulses without edema.  Abdomen: Soft, + BS.  Non tender, no guarding, rebound, hernias, masses. Lymphatics: Non tender without lymphadenopathy.  Musculoskeletal: Full ROM, 5/5 strength, Normal gait Skin: Warm, dry without rashes, lesions, ecchymosis.  Neuro:  Cranial nerves intact. No cerebellar symptoms.  Psych: Awake and oriented X 3, normal affect, Insight and Judgment appropriate.     Raynelle Dick, NP 10:42 AM [facility]

## 2023-05-01 ENCOUNTER — Ambulatory Visit (INDEPENDENT_AMBULATORY_CARE_PROVIDER_SITE_OTHER): Payer: Medicare HMO | Admitting: Nurse Practitioner

## 2023-05-01 ENCOUNTER — Encounter: Payer: Self-pay | Admitting: Nurse Practitioner

## 2023-05-01 VITALS — BP 116/60 | HR 70 | Temp 97.7°F | Ht 69.0 in | Wt 193.0 lb

## 2023-05-01 DIAGNOSIS — I700 Atherosclerosis of aorta: Secondary | ICD-10-CM

## 2023-05-01 DIAGNOSIS — E663 Overweight: Secondary | ICD-10-CM

## 2023-05-01 DIAGNOSIS — J8410 Pulmonary fibrosis, unspecified: Secondary | ICD-10-CM | POA: Diagnosis not present

## 2023-05-01 DIAGNOSIS — I25118 Atherosclerotic heart disease of native coronary artery with other forms of angina pectoris: Secondary | ICD-10-CM

## 2023-05-01 DIAGNOSIS — E1122 Type 2 diabetes mellitus with diabetic chronic kidney disease: Secondary | ICD-10-CM | POA: Diagnosis not present

## 2023-05-01 DIAGNOSIS — H6123 Impacted cerumen, bilateral: Secondary | ICD-10-CM

## 2023-05-01 DIAGNOSIS — E1159 Type 2 diabetes mellitus with other circulatory complications: Secondary | ICD-10-CM | POA: Diagnosis not present

## 2023-05-01 DIAGNOSIS — Z79899 Other long term (current) drug therapy: Secondary | ICD-10-CM | POA: Diagnosis not present

## 2023-05-01 DIAGNOSIS — E559 Vitamin D deficiency, unspecified: Secondary | ICD-10-CM

## 2023-05-01 DIAGNOSIS — E1169 Type 2 diabetes mellitus with other specified complication: Secondary | ICD-10-CM

## 2023-05-01 DIAGNOSIS — J449 Chronic obstructive pulmonary disease, unspecified: Secondary | ICD-10-CM | POA: Diagnosis not present

## 2023-05-01 DIAGNOSIS — Z87891 Personal history of nicotine dependence: Secondary | ICD-10-CM

## 2023-05-01 DIAGNOSIS — N182 Chronic kidney disease, stage 2 (mild): Secondary | ICD-10-CM

## 2023-05-01 DIAGNOSIS — J849 Interstitial pulmonary disease, unspecified: Secondary | ICD-10-CM

## 2023-05-01 DIAGNOSIS — G4733 Obstructive sleep apnea (adult) (pediatric): Secondary | ICD-10-CM

## 2023-05-01 DIAGNOSIS — E785 Hyperlipidemia, unspecified: Secondary | ICD-10-CM | POA: Diagnosis not present

## 2023-05-01 DIAGNOSIS — I10 Essential (primary) hypertension: Secondary | ICD-10-CM

## 2023-05-01 DIAGNOSIS — F17200 Nicotine dependence, unspecified, uncomplicated: Secondary | ICD-10-CM

## 2023-05-01 NOTE — Patient Instructions (Signed)

## 2023-05-02 LAB — CBC WITH DIFFERENTIAL/PLATELET
Absolute Monocytes: 747 {cells}/uL (ref 200–950)
Basophils Absolute: 49 {cells}/uL (ref 0–200)
Basophils Relative: 0.5 %
Eosinophils Absolute: 388 {cells}/uL (ref 15–500)
Eosinophils Relative: 4 %
HCT: 44.4 % (ref 38.5–50.0)
Hemoglobin: 14.9 g/dL (ref 13.2–17.1)
Lymphs Abs: 1959 {cells}/uL (ref 850–3900)
MCH: 31.7 pg (ref 27.0–33.0)
MCHC: 33.6 g/dL (ref 32.0–36.0)
MCV: 94.5 fL (ref 80.0–100.0)
MPV: 11.7 fL (ref 7.5–12.5)
Monocytes Relative: 7.7 %
Neutro Abs: 6557 {cells}/uL (ref 1500–7800)
Neutrophils Relative %: 67.6 %
Platelets: 177 10*3/uL (ref 140–400)
RBC: 4.7 10*6/uL (ref 4.20–5.80)
RDW: 11.7 % (ref 11.0–15.0)
Total Lymphocyte: 20.2 %
WBC: 9.7 10*3/uL (ref 3.8–10.8)

## 2023-06-11 ENCOUNTER — Other Ambulatory Visit: Payer: Self-pay | Admitting: Nurse Practitioner

## 2023-06-21 ENCOUNTER — Other Ambulatory Visit: Payer: Self-pay

## 2023-06-21 NOTE — Progress Notes (Addendum)
   06/21/2023  Patient ID: Joe Green Seen, male   DOB: 10-16-1950, 72 y.o.   MRN: [id number]     2025 Medication Assistance Renewal Application Summary:  Patient was outreached regarding medication assistance renewal for 2025. Verified address, anticipated insurance for 2025, and income has not changed. Patient remains interested in PAP for 2025 for [name], please see below for other medications identified for medication assistance.    Medication Review Findings:  [name]- no reported changes Breo cost $40/month - He is eligible for [employer]'s COPD plan. Patient had an upcoming dental appointment later this afternoon when I called and we were unable to call [employer] on three-way. Patient was agreeable to contact insurance with provided number.    Medication Assistance Findings:  Medication assistance needs identified: [name] and Breo ([employer]'s COPD enrollment needed)     Additional medication assistance options reviewed with patient as warranted:  No other options identified  Plan: I will route patient assistance letter to pharmacy technician who will coordinate patient assistance program application process for medications listed above.  Pharmacy technician will assist with obtaining all required documents from both patient and provider(s) and submit application(s) once completed.    Will follow up with patient to verify whether he called [employer].   Thank you for allowing pharmacy to be a part of this patient's care.  Cephus Shelling, PharmD Clinical Pharmacist Cell: [phone]

## 2023-06-27 NOTE — Progress Notes (Signed)
   06/27/2023  Patient ID: Joe Green, male   DOB: 1951-06-12, 72 y.o.   MRN: [id number]    Brief Summary:  Patient was outreached regarding medication assistance program through [facility] COPD Program. I called and spoke with patient today. He stated that he spoke with [facility] today about another medication but did not enroll in COPD program today (he called it their 90 day program). He did state that he they gave him a number to call for the 90day program. I offered to call the patient on three-way, but he said he did not have the time but will call [employer] later today. I also offered to confirm the number [employer] gave to him was for the COPD program and he wasn't at home to verify the number.   When patient is available for [employer]'s COPD enrollment, he is encouraged to call back.   Thank you for allowing pharmacy to be a part of this patient's care.  Cephus Shelling, PharmD Clinical Pharmacist Cell: [phone]

## 2023-07-02 ENCOUNTER — Other Ambulatory Visit: Payer: Self-pay | Admitting: Pharmacy Technician

## 2023-07-02 DIAGNOSIS — Z5986 Financial insecurity: Secondary | ICD-10-CM

## 2023-07-02 NOTE — Progress Notes (Addendum)
Pharmacy Medication Assistance Program Note    07/02/2023  Patient ID: Joe Green, male   DOB: 24-Oct-1950, 72 y.o.   MRN: [id number]     07/02/2023  Outreach Medication One  Initial Outreach Date (Medication One) 07/02/2023  Manufacturer Medication One Retail buyer Drugs [name]  Dose of [name] 3mg /0.47ml  Type of Radiographer, therapeutic Assistance  Date Application Sent to Patient 07/04/2023  Application Items Requested Application;Proof of Income;Other  Date Application Sent to Prescriber 07/04/2023  Name of Prescriber Anda Kraft     [facility]: Lambert Rhonda W 09/17/23 Care coordination call placed to [name] in regard to [name] application. Per [employer], they have not received a 2025 re enrollment application. Successful outreach to patient. HIPAA verified. Patient informs he dropped off the application on 09/10/23 to the provider's office. In basket message sent to Darene Lamer, CMA at the office inquiring about the application status.   Pattricia Boss, CPhT [place]  Office: [phone] Fax: [phone] Email: [email or web address]

## 2023-07-03 ENCOUNTER — Encounter: Payer: Medicare HMO | Admitting: Nurse Practitioner

## 2023-07-03 DIAGNOSIS — J00 Acute nasopharyngitis [common cold]: Secondary | ICD-10-CM | POA: Diagnosis not present

## 2023-07-14 DIAGNOSIS — J209 Acute bronchitis, unspecified: Secondary | ICD-10-CM | POA: Diagnosis not present

## 2023-07-17 ENCOUNTER — Other Ambulatory Visit: Payer: Self-pay | Admitting: Nurse Practitioner

## 2023-07-17 DIAGNOSIS — G2581 Restless legs syndrome: Secondary | ICD-10-CM

## 2023-07-23 ENCOUNTER — Other Ambulatory Visit: Payer: Self-pay | Admitting: Nurse Practitioner

## 2023-07-23 DIAGNOSIS — E1159 Type 2 diabetes mellitus with other circulatory complications: Secondary | ICD-10-CM

## 2023-07-25 ENCOUNTER — Encounter: Payer: Medicare HMO | Admitting: Nurse Practitioner

## 2023-07-27 ENCOUNTER — Ambulatory Visit (INDEPENDENT_AMBULATORY_CARE_PROVIDER_SITE_OTHER): Payer: Medicare HMO | Admitting: Nurse Practitioner

## 2023-07-27 ENCOUNTER — Encounter: Payer: Self-pay | Admitting: Nurse Practitioner

## 2023-07-27 VITALS — BP 118/70 | HR 83 | Temp 98.0°F | Resp 16 | Ht 69.0 in | Wt 188.4 lb

## 2023-07-27 DIAGNOSIS — E559 Vitamin D deficiency, unspecified: Secondary | ICD-10-CM

## 2023-07-27 DIAGNOSIS — H6123 Impacted cerumen, bilateral: Secondary | ICD-10-CM | POA: Diagnosis not present

## 2023-07-27 DIAGNOSIS — E785 Hyperlipidemia, unspecified: Secondary | ICD-10-CM | POA: Diagnosis not present

## 2023-07-27 DIAGNOSIS — G2581 Restless legs syndrome: Secondary | ICD-10-CM

## 2023-07-27 DIAGNOSIS — N401 Enlarged prostate with lower urinary tract symptoms: Secondary | ICD-10-CM

## 2023-07-27 DIAGNOSIS — R6889 Other general symptoms and signs: Secondary | ICD-10-CM | POA: Diagnosis not present

## 2023-07-27 DIAGNOSIS — E1122 Type 2 diabetes mellitus with diabetic chronic kidney disease: Secondary | ICD-10-CM

## 2023-07-27 DIAGNOSIS — Z1329 Encounter for screening for other suspected endocrine disorder: Secondary | ICD-10-CM

## 2023-07-27 DIAGNOSIS — J8410 Pulmonary fibrosis, unspecified: Secondary | ICD-10-CM

## 2023-07-27 DIAGNOSIS — E1159 Type 2 diabetes mellitus with other circulatory complications: Secondary | ICD-10-CM | POA: Diagnosis not present

## 2023-07-27 DIAGNOSIS — E663 Overweight: Secondary | ICD-10-CM

## 2023-07-27 DIAGNOSIS — I10 Essential (primary) hypertension: Secondary | ICD-10-CM | POA: Diagnosis not present

## 2023-07-27 DIAGNOSIS — F17200 Nicotine dependence, unspecified, uncomplicated: Secondary | ICD-10-CM

## 2023-07-27 DIAGNOSIS — Z1389 Encounter for screening for other disorder: Secondary | ICD-10-CM

## 2023-07-27 DIAGNOSIS — E1169 Type 2 diabetes mellitus with other specified complication: Secondary | ICD-10-CM

## 2023-07-27 DIAGNOSIS — Z0001 Encounter for general adult medical examination with abnormal findings: Secondary | ICD-10-CM

## 2023-07-27 DIAGNOSIS — N182 Chronic kidney disease, stage 2 (mild): Secondary | ICD-10-CM | POA: Diagnosis not present

## 2023-07-27 DIAGNOSIS — Z79899 Other long term (current) drug therapy: Secondary | ICD-10-CM | POA: Diagnosis not present

## 2023-07-27 DIAGNOSIS — Z136 Encounter for screening for cardiovascular disorders: Secondary | ICD-10-CM | POA: Diagnosis not present

## 2023-07-27 DIAGNOSIS — R972 Elevated prostate specific antigen [PSA]: Secondary | ICD-10-CM

## 2023-07-27 DIAGNOSIS — J449 Chronic obstructive pulmonary disease, unspecified: Secondary | ICD-10-CM

## 2023-07-27 DIAGNOSIS — R1011 Right upper quadrant pain: Secondary | ICD-10-CM

## 2023-07-27 DIAGNOSIS — I700 Atherosclerosis of aorta: Secondary | ICD-10-CM

## 2023-07-27 DIAGNOSIS — G4733 Obstructive sleep apnea (adult) (pediatric): Secondary | ICD-10-CM

## 2023-07-27 DIAGNOSIS — J849 Interstitial pulmonary disease, unspecified: Secondary | ICD-10-CM

## 2023-07-27 DIAGNOSIS — E538 Deficiency of other specified B group vitamins: Secondary | ICD-10-CM

## 2023-07-27 DIAGNOSIS — I25118 Atherosclerotic heart disease of native coronary artery with other forms of angina pectoris: Secondary | ICD-10-CM

## 2023-07-27 DIAGNOSIS — R350 Frequency of micturition: Secondary | ICD-10-CM | POA: Diagnosis not present

## 2023-07-27 MED ORDER — PRAMIPEXOLE DIHYDROCHLORIDE 1 MG PO TABS: ORAL_TABLET | ORAL | 2 refills | Status: DC

## 2023-07-27 MED ORDER — DEBROX 6.5 % OT SOLN: 5.0000 [drp] | Freq: Two times a day (BID) | OTIC | 2 refills | Status: DC

## 2023-07-27 NOTE — Patient Instructions (Signed)
YOU CAN CALL TO MAKE AN ULTRASOUND..  I have put in an order for an ultrasound for you to have You can set them up at your convenience by calling this number [phone] You will likely have the ultrasound at [address] [facility]  If you have any issues call our office and we will set this up for you.    Call to schedule follow up with pulmonology and urology.  Abdominal Pain, Adult  Pain in the abdomen (abdominal pain) can be caused by many things. In most cases, it gets better with no treatment or by being treated at home. But in some cases, it can be serious. Your health care provider will ask questions about your medical history and do a physical exam to try to figure out what is causing your pain. Follow these instructions at home: Medicines Take over-the-counter and prescription medicines only as told by your provider. Do not take medicines that help you poop (laxatives) unless told by your provider. General instructions Watch your condition for any changes. Drink enough fluid to keep your pee (urine) pale yellow. Contact a health care provider if: Your pain changes, gets worse, or lasts longer than expected. You have severe cramping or bloating in your abdomen, or you vomit. Your pain gets worse with meals, after eating, or with certain foods. You are constipated or have diarrhea for more than 2-3 days. You are not hungry, or you lose weight without trying. You have signs of dehydration. These may include: Dark pee, very little pee, or no pee. Cracked lips or dry mouth. Sleepiness or weakness. You have pain when you pee (urinate) or poop. Your abdominal pain wakes you up at night. You have blood in your pee. You have a fever. Get help right away if: You cannot stop vomiting. Your pain is only in one part of the abdomen. Pain on the right side could be caused by appendicitis. You have bloody or black poop (stool), or poop that looks like tar. You have trouble  breathing. You have chest pain. These symptoms may be an emergency. Get help right away. Call 911. Do not wait to see if the symptoms will go away. Do not drive yourself to the hospital. This information is not intended to replace advice given to you by your health care provider. Make sure you discuss any questions you have with your health care provider. Document Revised: 05/17/2022 Document Reviewed: 05/17/2022 Elsevier Patient Education  2024 [employer].

## 2023-07-27 NOTE — Progress Notes (Signed)
Complete Physical  Assessment and Plan:   Encounter for general adult medical examination with abnormal findings Due Yearly  Essential hypertension Continue medications:cardura 8 mg every day and ramipril 5 mg every day  Monitor blood pressure at home; call if consistently over 130/80 Continue DASH diet.   Reminder to go to the ER if any CP, SOB, nausea, dizziness, severe HA, changes vision/speech, left arm numbness and tingling and jaw pain. 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  Denies angina; normal myoview 07/2019 per Dr. Eden Emms Continue cardio follow up Stop smoking  OSA and COPD overlap syndrome (HCC) Does not use CPAP  Chronic obstructive pulmonary disease, unspecified COPD type (HCC)/ Pulmonary Fibrosis(HCC) Continue inhalers, stop smoking, has recent CT lung 05/2022 Continue to follow with pulmonology- call to schedule appointment with Dr. [name] Script for Albuterol 2 puffs q 6 hours PRN sent to pharmacy  Type 2 diabetes mellitus with other circulatory complication, without long-term current use of insulin [facility]) Education: Reviewed 'ABCs' of diabetes management (respective goals in parentheses):  A1C (<7), blood pressure (<130/80), and cholesterol (LDL <70) Eye Exam yearly and Dental Exam every 6 months - reminded due for annual diabetic eye exam - he will schedule with Dr. Dione Booze Glimepiride 4 mg at supper, Metformin 500 mg 1 tab daily and is to be increasing [name] to 3 mg SQ QW Dietary recommendations Physical Activity recommendations Foot exam completed - normal Normal ABI 2021 A1c  Benign prostatic hyperplasia with urinary frequency Doxazosin and finasteride Followed by urology- he will call to schedule a follow up appointment  Vitamin D deficiency Continue supplementation Check vitamin D  level  Smoking Discussed risks associated with tobacco use and advised to reduce or quit Patient has cut back to 1-2 a day Will follow up at the next visit Strategies for success discussed.  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  Check lipid panel.  CMP TSH  Medication management CBC, CMP/GFR, UA, magnesium  Elevated PSA Check PSA Followed by urology - will call to schedule a follow up appointment  Overweight (BMI 25.0-29.9) Long discussion about weight loss, diet, and exercise Recommended diet heavy in fruits and veggies and low in animal meats, cheeses, and dairy products, appropriate calorie intake Discussed appropriate weight for height  Follow up at next visit  Aortic atherosclerosis (HCC) Control blood pressure, cholesterol, glucose, increase exercise.   B12 deficiency  Recheck levels; never started supplement; recommend subligual supplement to maintain levels 500+   Diminished pulses Smoker, T2DM, normal ABI 2021  Screening for thyroid disorder - TSH  Screening for ischemic heart disease - EKG  Screening for hematuria/proteinuria - Routine UA with reflex microscopic - Microalbumin/creatinine urine ratio  Right upper Quadrant abdominal pain If pain worsens, develop nausea/vomiting or fever go to the ER - RUQ U/S ordered and will treat pending results  Cerumen impaction - Unable to remove wax with ear lavage - Declines ENT referral, would like to try Debrox drops at home Debrox sent to pharmacy   Discussed med's effects and SE's. Screening labs and tests as requested with regular follow-up as  recommended. Over 40 minutes of exam, counseling, chart  review and critical decision making was performed  Future Appointments  Date Time Provider Department Center  01/21/2024 10:30 AM Raynelle Dick, NP [facility] None  07/28/2024  9:00 AM Raynelle Dick, NP [facility] None     HPI 72 y.o. male patient presents for a complete physical. He has Hyperlipidemia associated with type 2 diabetes mellitus (HCC); CAD (coronary artery disease); Hypertension; OSA and COPD overlap syndrome (HCC); Smoking; Vitamin D deficiency; Medication management; Type 2 diabetes mellitus (HCC); RLS (restless legs syndrome); COPD (chronic obstructive pulmonary disease) (HCC); BPH (benign prostatic hyperplasia); Elevated PSA; Chondromalacia, left knee; Chondromalacia, right knee; Overweight (BMI 25.0-29.9); Aortic atherosclerosis (HCC)by Chest CT scan 11/2016; Hepatic steatosis; B12 deficiency; History of colonic polyps; Excessive drinking alcohol; ILD (interstitial lung disease) (HCC); and Gallstones on their problem list.  He is married with 3 kids, 1 is his. 82 Grandchildren. 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 He does find he misses his job at times.  Spending lots of time at the beach.    He is having right flank pain described as an aching pain 3/10 on pain scale.  Has been present x 1 week    he is a smoker, cutting down, but admits smoking average 1-2 cigarettes a day.  He had Ct lung in 05/25/22 which did not show any concerning nodules. 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 He takes Breo regularly, has Albuterol to use for rescue and has only used infrequently.    OSA, intolerant of CPAP and had been using mouth piece but he feels didn't work well; managed by Dr. Toni Arthurs, has been recommended repeat sleep study but patient adamantly declines repeat.     He has RLS,  Mirapex is not working as well and would like to  increase dosage if possible  BMI is Body mass index is 27.82 kg/m., he has been working on diet and admits less active since retiring, but "not sitting." staying busy around the home.  Admits to 2 beers a couple nights a week, 1 cup coffee daily  Generally watching diet  Wt Readings from Last 3 Encounters:  07/27/23 188 lb 6.4 oz (85.5 kg)  05/01/23 193 lb (87.5 kg)  01/17/23 189 lb 9.6 oz (86 kg)   He has a history of CAD s/p PCI to RCA in 2005. He underwent LHC in 2006 and 2012 which showed a proximal LAD 60% (unchanged from previous catheterization), Medical therapy recommended. Has done well since cath in 2012 and followed by Dr. Fabio Bering office, had normal follow up myoview in 07/2019.   His blood pressure has been controlled at home on cardura 8 mg every day and ramipril 5 mg every day  today their BP is BP: 118/70  BP Readings from Last 3 Encounters:  07/27/23 118/70  05/01/23 116/60  01/17/23 (!) 102/58  He does not workout.Marland Kitchen He denies chest pain, shortness of breath, dizziness.    He is on cholesterol medication (rosuvastastin 20 mg three days a week) and denies myalgias. His cholesterol is at goal. The cholesterol last visit was:   Lab Results  Component Value Date   CHOL 108 05/01/2023   HDL 47 05/01/2023   LDLCALC 45 05/01/2023   TRIG 77 05/01/2023   CHOLHDL 2.3 05/01/2023   He has been working on diet and  exercise for diabetes (on metformin 500 mg QD, glimepiride 4 mg at night and [name] 3 mg weekly- was to increase to 3 mg which should be next delivery this month ), he is on bASA, he is on ACE/ARB and denies foot ulcerations, increased appetite, nausea, paresthesia of the feet, polydipsia, polyuria, visual disturbances, vomiting and weight loss.  On ramipril 5 mg for kidney protection. Normal ABI 2021 He does check fasting sugars ranging 140-150 Last A1C in the office was:  Lab Results  Component Value Date   HGBA1C 6.9 (H) 05/01/2023   He is trying to drink  more water but has been decreased lately, Last GFR: Lab Results  Component Value Date   EGFR 99 05/01/2023     Patient is on Vitamin D supplement, 10000 IU daily    Lab Results  Component Value Date   VD25OH 72 06/29/2022     Has hx of elevated PSAs, BPH with LUTS (nocturia x 3/ night) He is on doxazosin and finasteride.  Lab Results  Component Value Date   PSA 1.57 06/29/2022   PSA 2.07 06/29/2021   PSA 2.4 04/23/2020   B12 was low, admits didn't start supplement Lab Results  Component Value Date   VITAMINB12 217 03/29/2022     Current Medications:  Current Outpatient Medications on File Prior to Visit  Medication Sig Dispense Refill   acetaminophen (TYLENOL) 500 MG tablet Take 2 tablets (1,000 mg total) by mouth every 6 (six) hours. 30 tablet 0   albuterol (VENTOLIN HFA) 108 (90 Base) MCG/ACT inhaler Inhale 2 puffs into the lungs every 6 (six) hours as needed for wheezing or shortness of breath. 8 g 2   aspirin EC 81 MG tablet Take 1 tablet (81 mg total) by mouth daily. 90 tablet 3   blood glucose meter kit and supplies Dispense based on patient and insurance preference. Use up to four times daily as directed. (FOR ICD-9 250.00, 250.01). 1 each 0   Cinnamon 500 MG TABS Take 1 tablet by mouth in the morning and at bedtime.     doxazosin (CARDURA) 8 MG tablet Take  1 tablet at Bedtime for BP & Prostate                                                                 /                                                    TAKE                                                               BY  MOUTH 90 tablet 3   Dulaglutide ([name]) 3 MG/0.5ML SOPN Inject 3 mg as directed once a week. 2 mL 3   fexofenadine (ALLEGRA) 60 MG tablet Take 60 mg by mouth daily as needed for allergies or rhinitis.     finasteride (PROSCAR) 5 MG tablet TAKE 1 TABLET BY MOUTH ONCE DAILY FOR PROSTATE 90 tablet 0   fluticasone furoate-vilanterol (BREO ELLIPTA)  100-25 MCG/ACT AEPB Use 1 inhalation Daily for COPD / Asthma 180 each 3   glimepiride (AMARYL) 4 MG tablet TAKE 1/2 TO 1 (ONE-HALF TO ONE) TABLET BY MOUTH ONCE DAILY WITH SUPPER FOR DIABETES 90 tablet 2   glucose blood test strip Test blood sugars once daily.Dx: E11.9 100 each 2   metFORMIN (GLUCOPHAGE-XR) 500 MG 24 hr tablet Take 1 tablets daily with meal 90 tablet 3   nitroGLYCERIN (NITROSTAT) 0.4 MG SL tablet Place 1 tablet (0.4 mg total) under the tongue every 5 (five) minutes as needed for chest pain. 25 tablet 3   ramipril (ALTACE) 5 MG capsule TAKE 1 CAPSULE BY MOUTH ONCE DAILY FOR  BLOOD  PRESSURE  AND  DIABETIC  KIDNEY  PROTECTION 90 capsule 0   rosuvastatin (CRESTOR) 20 MG tablet TAKE 1 TABLET BY MOUTH THREE TIMES A WEEK FOR CHOLESTEROL 36 tablet 0   Current Facility-Administered Medications on File Prior to Visit  Medication Dose Route Frequency Provider Last Rate Last Admin   regadenoson (LEXISCAN) injection SOLN 0.4 mg  0.4 mg Intravenous Once Lewayne Bunting, MD       technetium tetrofosmin (TC-MYOVIEW) injection 32.3 millicurie  32.3 millicurie Intravenous Once PRN Lewayne Bunting, MD       Allergies:  No Known Allergies Health Maintenance:  Immunization History  Administered Date(s) Administered   Influenza, High Dose Seasonal PF 06/22/2017, 08/03/2018, 05/15/2019, 07/28/2020, 06/29/2021, 06/15/2022   Influenza-Unspecified 06/02/2013, 08/18/2014   PFIZER(Purple Top)SARS-COV-2 Vaccination 11/19/2019, 12/10/2019   Pfizer Covid-19 Vaccine Bivalent Booster 34yrs & up 08/11/2020   Pneumococcal Conjugate-13 11/21/2016   Pneumococcal Polysaccharide-23 08/27/2007, 01/01/2018   Td 01/01/2018   Tdap 08/27/2007   Zoster, Live 10/20/2013   Health Maintenance  Topic Date Due   Zoster Vaccines- Shingrix (1 of 2) 08/21/1970   INFLUENZA VACCINE  03/15/2023   COVID-19 Vaccine (4 - 2024-25 season) 04/15/2023   Lung Cancer Screening  05/26/2023   Diabetic kidney evaluation - Urine  ACR  06/30/2023   FOOT EXAM  06/30/2023   OPHTHALMOLOGY EXAM  09/14/2023   HEMOGLOBIN A1C  10/29/2023   Medicare Annual Wellness (AWV)  01/17/2024   Colonoscopy  04/19/2024   Diabetic kidney evaluation - eGFR measurement  04/30/2024   DTaP/Tdap/Td (3 - Td or Tdap) 01/02/2028   Pneumonia Vaccine 61+ Years old  Completed   Hepatitis C Screening  Completed   HPV VACCINES  Aged Out     Names of Other Physician/Practitioners you currently use: 1. [facility] here for primary care 2. Dr. Dione Booze, eye doctor, last visit 09/13/22 3. Dr. ?, dentist, last visit remote, had scheduled but needs to find new dentist due to insurance    Patient Care Team: Lucky Cowboy, MD as PCP - General (Internal Medicine) Wendall Stade, MD as PCP - Cardiology (Cardiology) Wendall Stade, MD as Consulting Physician (Cardiology) Meryl Dare, MD as Consulting Physician (Gastroenterology) Charlett Nose, [facility] (Inactive) as Pharmacist (Pharmacist)  Medical History:  has Hyperlipidemia associated with type 2 diabetes mellitus [facility]); CAD (coronary artery disease); Hypertension; OSA and COPD  overlap syndrome (HCC); Smoking; Vitamin D deficiency; Medication management; Type 2 diabetes mellitus (HCC); RLS (restless legs syndrome); COPD (chronic obstructive pulmonary disease) (HCC); BPH (benign prostatic hyperplasia); Elevated PSA; Chondromalacia, left knee; Chondromalacia, right knee; Overweight (BMI 25.0-29.9); Aortic atherosclerosis (HCC)by Chest CT scan 11/2016; Hepatic steatosis; B12 deficiency; History of colonic polyps; Excessive drinking alcohol; ILD (interstitial lung disease) (HCC); and Gallstones on their problem list. Surgical History:  He  has a past surgical history that includes heart stent (2005); Colonoscopy (11/2011); Polypectomy; Cardiac catheterization (2005); Inguinal hernia repair (Right, 11/16/2014); Insertion of mesh (Right, 11/16/2014); Knee arthroscopy with  medial menisectomy (Bilateral, 06/25/2017); and Chondroplasty (06/25/2017). Family History:  His family history includes Cancer in his mother; Hypertension in his father; Kidney cancer in his maternal grandmother; Prostate cancer in his father. Social History:   reports that he quit smoking about 1 years ago. His smoking use included cigarettes. He started smoking about 54 years ago. He has a 26.5 pack-year smoking history. He has never used smokeless tobacco. He reports current alcohol use of about 14.0 standard drinks of alcohol per week. He reports that he does not use drugs. Review of Systems:  Review of Systems  Constitutional:  Negative for chills, fever, malaise/fatigue and weight loss.       Restless legs  HENT:  Negative for congestion, hearing loss, sinus pain, sore throat and tinnitus.   Eyes:  Negative for blurred vision and double vision.  Respiratory:  Negative for cough, hemoptysis, sputum production, shortness of breath and wheezing.   Cardiovascular:  Negative for chest pain, palpitations, orthopnea, claudication, leg swelling and PND.  Gastrointestinal:  Positive for abdominal pain (RUQ). Negative for blood in stool, constipation, diarrhea, heartburn, melena, nausea and vomiting.  Genitourinary: Negative.  Negative for dysuria and urgency.       Nocturia x 2  Musculoskeletal:  Negative for back pain, falls, joint pain, myalgias and neck pain.  Skin:  Negative for rash.  Neurological:  Negative for dizziness, tingling, tremors, sensory change, weakness and headaches.  Endo/Heme/Allergies:  Positive for environmental allergies. Negative for polydipsia. Does not bruise/bleed easily.  Psychiatric/Behavioral:  Negative for depression, memory loss, substance abuse and suicidal ideas. The patient has insomnia (intermittent with RLS symptoms). The patient is not nervous/anxious.   All other systems reviewed and are negative.   Physical Exam: Estimated body mass index is 27.82 kg/m  as calculated from the following:   Height as of this encounter: 5\' 9"  (1.753 m).   Weight as of this encounter: 188 lb 6.4 oz (85.5 kg). BP 118/70   Pulse 83   Temp 98 F (36.7 C)   Resp 16   Ht 5\' 9"  (1.753 m)   Wt 188 lb 6.4 oz (85.5 kg)   SpO2 96%   BMI 27.82 kg/m  General Appearance: Well nourished, in no apparent distress.  Eyes: PERRLA, EOMs, conjunctiva no swelling or erythema, normal fundi and vessels.  Sinuses: No Frontal/maxillary tenderness  ENT/Mouth: Ext aud canals clear. TM cerumen impaction bilaterally- ear lavage performed but only able to remove very small amount Good dentition. No erythema, swelling, or exudate on post pharynx. Hearing normal.  Neck: Supple, thyroid normal. No bruits  Respiratory: Respiratory effort normal, BS equal bilaterally without rales, rhonchi, wheezing or stridor.  Cardio: RRR without murmurs, rubs or gallops. Intact brisk pedal pulses, post tibial pulses are faint,  without edema.  Chest: symmetric, with normal excursions and percussion.  Abdomen: + BS, soft, tenderness and possible mass RUQ Lymphatics: Non  tender without lymphadenopathy.  Genitourinary: No concerns, declines; urology following Musculoskeletal: Full ROM all peripheral extremities,5/5 strength, and normal gait.  Skin: Warm, dry . Neuro: Cranial nerves intact, reflexes equal bilaterally. Normal muscle tone, no cerebellar symptoms. Sensation intact.  Psych: Awake and oriented X 3, normal affect, Insight and Judgment appropriate.   EKG: NSR, no ST changes   Armour Villanueva E  9:07 AM [facility]

## 2023-07-28 LAB — LIPID PANEL
Cholesterol: 102 mg/dL (ref <200)
HDL: 45 mg/dL (ref >=40)
LDL Cholesterol (Calc): 41 mg/dL
Non-HDL Cholesterol (Calc): 57 mg/dL (ref <130)
Total CHOL/HDL Ratio: 2.3 (calc) (ref <5.0)
Triglycerides: 79 mg/dL (ref <150)

## 2023-07-28 LAB — COMPLETE METABOLIC PANEL WITH GFR
AG Ratio: 1.8 (calc) (ref 1.0–2.5)
ALT: 16 U/L (ref 9–46)
AST: 11 U/L (ref 10–35)
Albumin: 4.2 g/dL (ref 3.6–5.1)
Alkaline phosphatase (APISO): 98 U/L (ref 35–144)
BUN: 12 mg/dL (ref 7–25)
CO2: 30 mmol/L (ref 20–32)
Calcium: 9.4 mg/dL (ref 8.6–10.3)
Chloride: 103 mmol/L (ref 98–110)
Creat: 0.78 mg/dL (ref 0.70–1.28)
Globulin: 2.3 g/dL (ref 1.9–3.7)
Glucose, Bld: 143 mg/dL — ABNORMAL HIGH (ref 65–99)
Potassium: 4.3 mmol/L (ref 3.5–5.3)
Sodium: 140 mmol/L (ref 135–146)
Total Bilirubin: 0.7 mg/dL (ref 0.2–1.2)
Total Protein: 6.5 g/dL (ref 6.1–8.1)
eGFR: 95 mL/min/{1.73_m2} (ref >=60)

## 2023-07-28 LAB — MICROSCOPIC MESSAGE

## 2023-07-28 LAB — MAGNESIUM: Magnesium: 1.8 mg/dL (ref 1.5–2.5)

## 2023-07-28 LAB — CBC WITH DIFFERENTIAL/PLATELET
Absolute Lymphocytes: 1989 {cells}/uL (ref 850–3900)
Absolute Monocytes: 639 {cells}/uL (ref 200–950)
Basophils Absolute: 27 {cells}/uL (ref 0–200)
Basophils Relative: 0.3 %
Eosinophils Absolute: 450 {cells}/uL (ref 15–500)
Eosinophils Relative: 5 %
HCT: 41.7 % (ref 38.5–50.0)
Hemoglobin: 14.1 g/dL (ref 13.2–17.1)
MCH: 31.5 pg (ref 27.0–33.0)
MCHC: 33.8 g/dL (ref 32.0–36.0)
MCV: 93.1 fL (ref 80.0–100.0)
MPV: 11.9 fL (ref 7.5–12.5)
Monocytes Relative: 7.1 %
Neutro Abs: 5895 {cells}/uL (ref 1500–7800)
Neutrophils Relative %: 65.5 %
Platelets: 161 10*3/uL (ref 140–400)
RBC: 4.48 10*6/uL (ref 4.20–5.80)
RDW: 11.7 % (ref 11.0–15.0)
Total Lymphocyte: 22.1 %
WBC: 9 10*3/uL (ref 3.8–10.8)

## 2023-07-28 LAB — URINALYSIS, ROUTINE W REFLEX MICROSCOPIC
Bilirubin Urine: NEGATIVE
Glucose, UA: NEGATIVE
Leukocytes,Ua: NEGATIVE
Nitrite: NEGATIVE
Specific Gravity, Urine: 1.023 (ref 1.001–1.035)
pH: 6 (ref 5.0–8.0)

## 2023-07-28 LAB — TSH: TSH: 1.4 m[IU]/L (ref 0.40–4.50)

## 2023-07-28 LAB — MICROALBUMIN / CREATININE URINE RATIO
Creatinine, Urine: 228 mg/dL (ref 20–320)
Microalb Creat Ratio: 28 mg/g{creat} (ref <30)
Microalb, Ur: 6.4 mg/dL

## 2023-07-28 LAB — VITAMIN D 25 HYDROXY (VIT D DEFICIENCY, FRACTURES): Vit D, 25-Hydroxy: 45 ng/mL (ref 30–100)

## 2023-07-28 LAB — HEMOGLOBIN A1C W/OUT EAG: Hgb A1c MFr Bld: 7.4 %{Hb} — ABNORMAL HIGH (ref <5.7)

## 2023-07-28 LAB — PSA: PSA: 1.68 ng/mL (ref <=4.00)

## 2023-08-03 ENCOUNTER — Ambulatory Visit
Admission: RE | Admit: 2023-08-03 | Discharge: 2023-08-03 | Disposition: A | Discharge status: Home / Self Care | Payer: Medicare HMO | Source: Ambulatory Visit | Attending: Nurse Practitioner | Admitting: Nurse Practitioner

## 2023-08-03 DIAGNOSIS — K8020 Calculus of gallbladder without cholecystitis without obstruction: Secondary | ICD-10-CM | POA: Diagnosis not present

## 2023-08-03 DIAGNOSIS — R1011 Right upper quadrant pain: Secondary | ICD-10-CM

## 2023-08-10 ENCOUNTER — Encounter: Payer: Medicare HMO | Admitting: Nurse Practitioner

## 2023-08-15 DIAGNOSIS — E119 Type 2 diabetes mellitus without complications: Secondary | ICD-10-CM | POA: Diagnosis not present

## 2023-08-15 DIAGNOSIS — I10 Essential (primary) hypertension: Secondary | ICD-10-CM | POA: Diagnosis not present

## 2023-08-15 DIAGNOSIS — Z7982 Long term (current) use of aspirin: Secondary | ICD-10-CM | POA: Diagnosis not present

## 2023-08-15 DIAGNOSIS — F1010 Alcohol abuse, uncomplicated: Secondary | ICD-10-CM | POA: Diagnosis not present

## 2023-08-15 DIAGNOSIS — Z79899 Other long term (current) drug therapy: Secondary | ICD-10-CM | POA: Diagnosis not present

## 2023-08-15 DIAGNOSIS — G459 Transient cerebral ischemic attack, unspecified: Secondary | ICD-10-CM | POA: Diagnosis not present

## 2023-08-15 DIAGNOSIS — I6782 Cerebral ischemia: Secondary | ICD-10-CM | POA: Diagnosis not present

## 2023-08-15 DIAGNOSIS — R29818 Other symptoms and signs involving the nervous system: Secondary | ICD-10-CM | POA: Diagnosis not present

## 2023-08-15 DIAGNOSIS — F17210 Nicotine dependence, cigarettes, uncomplicated: Secondary | ICD-10-CM | POA: Diagnosis not present

## 2023-08-15 DIAGNOSIS — Z0389 Encounter for observation for other suspected diseases and conditions ruled out: Secondary | ICD-10-CM | POA: Diagnosis not present

## 2023-08-15 DIAGNOSIS — R4701 Aphasia: Secondary | ICD-10-CM | POA: Diagnosis not present

## 2023-08-15 DIAGNOSIS — Z7984 Long term (current) use of oral hypoglycemic drugs: Secondary | ICD-10-CM | POA: Diagnosis not present

## 2023-08-15 HISTORY — DX: Transient cerebral ischemic attack, unspecified: G45.9

## 2023-08-16 DIAGNOSIS — G459 Transient cerebral ischemic attack, unspecified: Secondary | ICD-10-CM | POA: Diagnosis not present

## 2023-08-16 DIAGNOSIS — I6389 Other cerebral infarction: Secondary | ICD-10-CM

## 2023-08-16 DIAGNOSIS — E119 Type 2 diabetes mellitus without complications: Secondary | ICD-10-CM | POA: Diagnosis not present

## 2023-08-16 DIAGNOSIS — I10 Essential (primary) hypertension: Secondary | ICD-10-CM | POA: Diagnosis not present

## 2023-08-19 NOTE — Progress Notes (Signed)
 Chief Complaint: Cholelithiasis , RUQ pain Primary GI Doctor: (previously Dr. Aneita) Dr. Federico  HPI: Patient is a 73 year old male patient with medical history of hypertension, kidney stones, COPD, type 2 diabetes, BPH, vitamin D  deficiency, tobacco use, hyperlipidemia, CAD s/p PCI to RCA (2006),chronic RBBB, chronic kidney disease stage II who was referred to me by Tonita Fallow, MD  for a complaint of RUQ abdominal pain and Cholelithiasis.   On 07/27/2023 patient saw PCP and complained of right upper quadrant abdominal pain. He is having right flank pain described as an aching pain 3/10 on pain scale. Has been present x 1 week . A right upper quadrant ultrasound was ordered. Cholelithiasis without secondary signs of acute cholecystitis. Mild increased hepatic parenchymal echogenicity suggestive of steatosis.  On 10/31/2022 patient seen in [facility] ED for left flank pain. cbc with wbc 12.3, cmp nl other than glucose elevated at 178, lip nl, ua nl . CT renal showed cholelithiasis, right kidney stone, mild colonic diverticulosis and prostatomegaly. He was given pain medication and discharged with diagnosis muscle strain.   On 04/19/21 patient was last seen in our GI clinic ([facility]) for colonoscopy with Dr. Aneita. 4 tubular adenomas and 2 benign. Moderate diverticulosis in the left colon. Internal hemorrhoids.   Interval History:    Patient complains of RUQ dull achy pain that radiates to back that started few months ago. He reports the severity of the pain has increased and will be 7-8/10 pain scale. He reports the it is intermittent and occurs with or without eating. The pain is not improved with position or defecation. Patient denies GERD. He reports intermittent indigestion. Patient denies nausea or vomiting. Patient denies weight loss. He eats on average two meals per day. He does report he has been consuming less because he feels full quickly.  Admits 2 beers per day. Every day smoker 10  cigarettes per day, 50 year pack life. He states he is trying to quit.    Patient reports he had TIA last week at [facility], he presented with slurred speech. Patient started on Plavix  75 mg po daily and baby ASA continued. Today his speech is clear. Patient's family history includes mother with CA (unknown type).  Wt Readings from Last 3 Encounters:  08/20/23 189 lb (85.7 kg)  07/27/23 188 lb 6.4 oz (85.5 kg)  05/01/23 193 lb (87.5 kg)    Past Medical History:  Diagnosis Date   Aortic atherosclerosis (HCC)by Chest CT scan 11/2016 12/30/2017   Per chest CT 11/2016     B12 deficiency 04/22/2020   BPH (benign prostatic hyperplasia) 10/05/2014   CAD (coronary artery disease)    a. s/p stent to RCA 2005; b. LHC 2006 after Nuc with inf ischemia: patent RCA stent, D1 80%, LAD 60% => med Rx;  c. LHC 04/18/11: Proximal LAD 60% (unchanged from previous catheterization), distal LAD 65% (small caliber), Dx 60%, proximal CFX 20%, mid CFX 50%, OM1 30%, proximal RCA 20%, mid RCA stent patent, EF 65%.=> med Rx   COPD (chronic obstructive pulmonary disease) (HCC) 10/05/2014   Gallstones 02/21/2021   GERD (gastroesophageal reflux disease)    Hepatic steatosis 02/17/2020   Per CT 02/2020     History of colonic polyps 04/23/2020   HLD (hyperlipidemia)    Hyperlipidemia associated with type 2 diabetes mellitus (HCC) 04/03/2011   Hypertension    ILD (interstitial lung disease) (HCC) 02/21/2021   Suggested by CT 02/2021 - pulmonology referral placed     Mini  stroke 08/2023   [facility]   Obesity    OSA (obstructive sleep apnea)    has a cpap-does not use-has a special mouthpiece he wears at night   RLS (restless legs syndrome) 10/05/2014   Type 2 diabetes mellitus (HCC) 06/05/2014   Type II or unspecified type diabetes mellitus without mention of complication, not stated as uncontrolled    Vitamin D  deficiency    Wears glasses    Past Surgical History:  Procedure Laterality Date   CARDIAC  CATHETERIZATION  2005   stent   CHONDROPLASTY  06/25/2017   Procedure: CHONDROPLASTY;  Surgeon: Yvone Rush, MD;  Location: [facility];  Service: Orthopedics;;   COLONOSCOPY  11/2011   Due 5 years Dr. Aneita   heart stent  2005   RCA   INGUINAL HERNIA REPAIR Right 11/16/2014   Procedure: RIGHT INGUINAL HERNIA REPAIR WITH MESH;  Surgeon: Krystal Spinner, MD;  Location: [facility];  Service: General;  Laterality: Right;   INSERTION OF MESH Right 11/16/2014   Procedure: INSERTION OF MESH;  Surgeon: Krystal Spinner, MD;  Location: [facility];  Service: General;  Laterality: Right;   KNEE ARTHROSCOPY WITH MEDIAL MENISECTOMY Bilateral 06/25/2017   Procedure: right knee arthroscopy with medial meniscectomy, chondroplasty, Left knee arthroscopy with medial menisectomy removal of plica and chondroplasty;  Surgeon: Yvone Rush, MD;  Location:  SURGERY CENTER;  Service: Orthopedics;  Laterality: Bilateral;   POLYPECTOMY      Current Outpatient Medications  Medication Sig Dispense Refill   acetaminophen  (TYLENOL ) 500 MG tablet Take 2 tablets (1,000 mg total) by mouth every 6 (six) hours. (Patient taking differently: Take 1,000 mg by mouth every 6 (six) hours as needed.) 30 tablet 0   albuterol  (VENTOLIN  HFA) 108 (90 Base) MCG/ACT inhaler Inhale 2 puffs into the lungs every 6 (six) hours as needed for wheezing or shortness of breath. 8 g 2   aspirin  EC 81 MG tablet Take 1 tablet (81 mg total) by mouth daily. 90 tablet 3   Cinnamon 500 MG TABS Take 1 tablet by mouth in the morning and at bedtime.     clopidogrel  (PLAVIX ) 75 MG tablet Take 75 mg by mouth daily.     doxazosin  (CARDURA ) 8 MG tablet Take  1 tablet at Bedtime for BP & Prostate                                                                 /                                                    TAKE                                                               BY  MOUTH 90 tablet 3   Dulaglutide  ([name] ) 3 MG/0.5ML SOPN Inject 3 mg as directed once a week. 2 mL 3   fexofenadine (ALLEGRA) 60 MG tablet Take 60 mg by mouth daily as needed for allergies or rhinitis.     finasteride  (PROSCAR ) 5 MG tablet TAKE 1 TABLET BY MOUTH ONCE DAILY FOR PROSTATE 90 tablet 0   fluticasone  furoate-vilanterol (BREO ELLIPTA ) 100-25 MCG/ACT AEPB Use 1 inhalation Daily for COPD / Asthma 180 each 3   glimepiride  (AMARYL ) 4 MG tablet TAKE 1/2 TO 1 (ONE-HALF TO ONE) TABLET BY MOUTH ONCE DAILY WITH SUPPER FOR DIABETES 90 tablet 2   glucose blood test strip Test blood sugars once daily.Dx: E11.9 100 each 2   metFORMIN  (GLUCOPHAGE -XR) 500 MG 24 hr tablet Take 1 tablets daily with meal 90 tablet 3   nitroGLYCERIN  (NITROSTAT ) 0.4 MG SL tablet Place 1 tablet (0.4 mg total) under the tongue every 5 (five) minutes as needed for chest pain. 25 tablet 3   pramipexole  (MIRAPEX ) 1 MG tablet 1-2 tabs at bedtime 180 tablet 2   ramipril  (ALTACE ) 5 MG capsule TAKE 1 CAPSULE BY MOUTH ONCE DAILY FOR  BLOOD  PRESSURE  AND  DIABETIC  KIDNEY  PROTECTION 90 capsule 0   rosuvastatin  (CRESTOR ) 20 MG tablet TAKE 1 TABLET BY MOUTH THREE TIMES A WEEK FOR CHOLESTEROL 36 tablet 0   No current facility-administered medications for this visit.   Facility-Administered Medications Ordered in Other Visits  Medication Dose Route Frequency Provider Last Rate Last Admin   regadenoson  (LEXISCAN ) injection SOLN 0.4 mg  0.4 mg Intravenous Once Crenshaw, Brian S, MD       technetium tetrofosmin  (TC-MYOVIEW ) injection 32.3 millicurie  32.3 millicurie Intravenous Once PRN Pietro Redell RAMAN, MD       Allergies as of 08/20/2023   (No Known Allergies)   Family History  Problem Relation Age of Onset   Cancer Mother        vaginal?    Prostate cancer Father    Hypertension Father    Kidney cancer Maternal Grandmother    Colon cancer Neg Hx    Colon polyps Neg Hx    Esophageal cancer Neg Hx    Rectal  cancer Neg Hx    Stomach cancer Neg Hx    Review of Systems:    Constitutional: No weight loss, fever, chills, weakness or fatigue HEENT: Eyes: No change in vision               Ears, Nose, Throat:  No change in hearing or congestion Skin: No rash or itching Cardiovascular: No chest pain, chest pressure or palpitations   Respiratory: No SOB or cough Gastrointestinal: See HPI and otherwise negative Genitourinary: No dysuria or change in urinary frequency Neurological: No headache, dizziness or syncope Musculoskeletal: No new muscle or joint pain Hematologic: No bleeding or bruising Psychiatric: No history of depression or anxiety   Physical Exam:  Vital signs: BP (!) 108/50   Pulse 95   Ht 5' 10 (1.778 m)   Wt 189 lb (85.7 kg)   SpO2 95%   BMI 27.12 kg/m   Constitutional: Pleasant Caucasian male appears to be in NAD, Well developed, Well nourished, alert and cooperative Neck:  Supple Throat: Oral cavity and pharynx without inflammation, swelling or lesion.  Respiratory: Respirations even and unlabored. Lungs clear to auscultation bilaterally.   No wheezes, crackles, or rhonchi.  Cardiovascular: Normal S1, S2.Regular rate and rhythm. No peripheral edema, cyanosis or pallor.  Gastrointestinal:  Soft, nondistended, RUQ pain and side pain with palpation. No rebound or guarding. Normal bowel sounds. No appreciable masses or hepatomegaly. Rectal:  Not performed.  Skin:   Dry and intact without significant lesions or rashes. Psychiatric: Oriented to person, place and time. Demonstrates good judgement and reason without abnormal affect or behaviors.  RELEVANT LABS AND IMAGING: CBC    Latest Ref Rng & Units 07/27/2023   10:00 AM 05/01/2023   10:58 AM 01/17/2023   11:07 AM  CBC  WBC 3.8 - 10.8 Thousand/uL 9.0  9.7  9.1   Hemoglobin 13.2 - 17.1 g/dL 85.8  85.0  85.6   Hematocrit 38.5 - 50.0 % 41.7  44.4  41.9   Platelets 140 - 400 Thousand/uL 161  177  190     CMP     Latest Ref  Rng & Units 07/27/2023   10:00 AM 05/01/2023   10:58 AM 01/17/2023   11:07 AM  CMP  Glucose 65 - 99 mg/dL 856  847  832   BUN 7 - 25 mg/dL 12  7  13    Creatinine 0.70 - 1.28 mg/dL 9.21  9.30  9.31   Sodium 135 - 146 mmol/L 140  137  136   Potassium 3.5 - 5.3 mmol/L 4.3  4.2  4.4   Chloride 98 - 110 mmol/L 103  102  104   CO2 20 - 32 mmol/L 30  27  25    Calcium  8.6 - 10.3 mg/dL 9.4  9.3  8.9   Total Protein 6.1 - 8.1 g/dL 6.5  6.5  6.4   Total Bilirubin 0.2 - 1.2 mg/dL 0.7  0.4  0.5   AST 10 - 35 U/L 11  13  12    ALT 9 - 46 U/L 16  17  18      Lab Results  Component Value Date   LIPASE 26 10/31/2022    08/03/2023 US  Abd limited RUQ  IMPRESSION: 1. Cholelithiasis without secondary signs of acute cholecystitis. 2. Mild increased hepatic parenchymal echogenicity suggestive of steatosis.  06/09/2021 CT Abdomen pelvis without contrast IMPRESSION: 1. No acute process identified.  No hernia visualized. 2. Cholelithiasis. 3. Right renal calculus. 4. Mild colonic diverticulosis. 5. Marked prostatomegaly.  04/19/21 colonoscopy with Dr. Aneita Impression:  - Five 5 to 8 mm polyps in the rectum, in the descending colon and in the transverse colon, removed with a cold snare. Resected and retrieved. - Moderate diverticulosis in the left colon.  - Internal hemorrhoids.  - The examination was otherwise normal on direct and retroflexion views. Path: Diagnosis Surgical [P], transverse, descending, rectal, polyps (5) - TUBULAR ADENOMA (4 OF 6 FRAGMENTS) - BENIGN COLONIC MUCOSA (2 OF 6 FRAGMENTS) - NO HIGH-GRADE DYSPLASIA OR MALIGNANCY IDENTIFIED  11/03/2011 colonoscopy with Dr. Aneita Impression: 7 mm sessile polyp in the ascending colon 4 mm sessile polyp in the sigmoid colon Moderate diverticulosis in the sigmoid to descending colon Internal hemorrhoids Path:  Diagnosis Surgical [P], descending and sigmoid colon, polyps - TUBULAR ADENOMA (X1); NEGATIVE FOR HIGH GRADE DYSPLASIA OR  MALIGNANCY. - HYPERPLASTIC POLYP (X1)  08/18/1999 colonoscopy with Dr. Aneita Impression: Colonoscopy to cecum Diverticulosis Transverse colon polyp Path:COLON, TRANSVERSE POLYP: TUBULAR ADENOMA. NO HIGH GRADE DYSPLASIA OR MALIGNANCY IDENTIFIED.   Assessment: Encounter Diagnoses  Name Primary?   Calculus of gallbladder without cholecystitis without obstruction Yes   RUQ pain    73 year old male presents with RUQ abdominal pain that radiates to back with indigestion and early satiety  that has increased in severity over the course of the last few months. WBC WNL at 9. Normal LFT's. Abd u/s showed Cholelithiasis without secondary signs of acute cholecystitis. We discussed surgical consultation versus starting him on Ursodiol. He would like to go ahead and proceed with surgical consult.  He will also be due for surveillance screening colonoscopy in September and will have him follow-up with Dr. Federico before we schedule his procedures.  Plan: -Avoid fatty greasy foods and alcohol. -Refer to surgical consult for evaluation of possible cholecystectomy. -Smoking cessation -Colonoscopy 3 year recall, (04/2024) with extensive prep -Follow-up scheduled with Dr. Federico in 6 months   Marykate Heuberger, [facility] 08/20/2023, 11:24 AM  Cc: Tonita Fallow, MD

## 2023-08-20 ENCOUNTER — Encounter: Payer: Self-pay | Admitting: Gastroenterology

## 2023-08-20 ENCOUNTER — Telehealth: Payer: Self-pay

## 2023-08-20 ENCOUNTER — Ambulatory Visit: Payer: Medicare HMO | Admitting: Gastroenterology

## 2023-08-20 VITALS — BP 108/50 | HR 95 | Ht 70.0 in | Wt 189.0 lb

## 2023-08-20 DIAGNOSIS — R1011 Right upper quadrant pain: Secondary | ICD-10-CM | POA: Diagnosis not present

## 2023-08-20 DIAGNOSIS — R6881 Early satiety: Secondary | ICD-10-CM

## 2023-08-20 DIAGNOSIS — K30 Functional dyspepsia: Secondary | ICD-10-CM | POA: Diagnosis not present

## 2023-08-20 DIAGNOSIS — K8020 Calculus of gallbladder without cholecystitis without obstruction: Secondary | ICD-10-CM

## 2023-08-20 DIAGNOSIS — G459 Transient cerebral ischemic attack, unspecified: Secondary | ICD-10-CM | POA: Insufficient documentation

## 2023-08-20 NOTE — Progress Notes (Signed)
 I agree with assessment and plan as outlined by Joe Green.

## 2023-08-20 NOTE — Patient Instructions (Addendum)
 If your blood pressure at your visit was 140/90 or greater, please contact your primary care physician to follow up on this. ______________________________________________________  If you are age 72 or older, your body mass index should be between 23-30. Your Body mass index is 27.12 kg/m. If this is out of the aforementioned range listed, please consider follow up with your Primary Care Provider.  If you are age 66 or younger, your body mass index should be between 19-25. Your Body mass index is 27.12 kg/m. If this is out of the aformentioned range listed, please consider follow up with your Primary Care Provider.  ________________________________________________________  The [facility] providers would like to encourage you to use MYCHART to communicate with providers for non-urgent requests or questions.  Due to long hold times on the telephone, sending your provider a message by [facility] Green be a faster and more efficient way to get a response.  Please allow 48 business hours for a response.  Please remember that this is for non-urgent requests.  _______________________________________________________  Due to recent changes in healthcare laws, you Green see the results of your imaging and laboratory studies on MyChart before your provider has had a chance to review them.  We understand that in some cases there Green be results that are confusing or concerning to you. Not all laboratory results come back in the same time frame and the provider Green be waiting for multiple results in order to interpret others.  Please give us  48 hours in order for your provider to thoroughly review all the results before contacting the office for clarification of your results.   We are referring you to [facility].  They will contact you directly to schedule an appointment.  It Green take a week or more before you hear from them.  Please feel free to contact us  if you have not heard from them within 2 weeks and  we will follow up on the referral.   Please follow up in 6 months with Dr. Estefana Kidney to discuss colonoscopy.  Thank you for entrusting me with your care and for choosing [facility], Joe May, NP

## 2023-08-20 NOTE — Telephone Encounter (Signed)
 Referral faxed to [facility] Dx: gallstones and RUQ pain

## 2023-08-23 NOTE — Telephone Encounter (Addendum)
 Called [facility]. Referral for Gallstones  K80.20 and RUQ pain  R10.11.  They said they don't see that anyone called the patient so he must have called them.  Called patient. He said someone called him and told him they need more information from us .  ? I have confirmation fax was successfully sent.  Will give it a few more days since referral was just sent on Monday afternoon of this week

## 2023-08-23 NOTE — Telephone Encounter (Signed)
 Inbound call from patient stating that he spoke with [facility] and they advised patient they are in need of the codes for his diagnoses . Please advise.

## 2023-08-28 NOTE — Progress Notes (Signed)
Not seen

## 2023-08-29 ENCOUNTER — Ambulatory Visit: Payer: Medicare HMO | Admitting: Nurse Practitioner

## 2023-08-29 ENCOUNTER — Encounter: Payer: Self-pay | Admitting: Nurse Practitioner

## 2023-08-29 VITALS — BP 108/60 | HR 72 | Temp 97.3°F | Ht 69.0 in | Wt 191.2 lb

## 2023-08-29 NOTE — Progress Notes (Signed)
Hospital follow up  Assessment and Plan: Hospital visit follow up for:   Heywood Footman" was seen today for hospitalization follow-up.  Diagnoses and all orders for this visit:  TIA (transient ischemic attack) Continue Plavix and ASA until 09/04/23 and then take only Plavix Will need to hold Plavix for 7 days prior to upcoming gallbladder surgery  Essential hypertension - continue medications- Ramipril 5 mg every day , Cardura 8 mg every day , DASH diet, exercise and monitor at home. Call if greater than 130/80.   -     CBC with Differential/Platelet -     COMPLETE METABOLIC PANEL WITH GFR  Type 2 diabetes mellitus with stage 2 chronic kidney disease, without long-term current use of insulin (HCC) Continue medications:Glimepiride 4 mg every day  Continue diet and exercise.  Perform daily foot/skin check, notify office of any concerning changes.  Check CMP  Chronic obstructive pulmonary disease, unspecified COPD type (HCC) Continue medications Continue to follow with pulmonology Needs evaluated with pulmonology prior to surgery  ILD (interstitial lung disease) (HCC) Continue medications Continue to follow with pulmonology Needs evaluated with pulmonology prior to surgery  Biliary colic Is to have upcoming cholecystectomy From medical standpoint he is cleared  Will need to hold plavix 7 days prior to surgery     All medications were reviewed with patient and family and fully reconciled. All questions answered fully, and patient and family members were encouraged to call the office with any further questions or concerns. Discussed goal to avoid readmission related to this diagnosis.     Over 40 minutes of exam, counseling, chart review, and complex, high/moderate level critical decision making was performed this visit.   Future Appointments  Date Time Provider Department Center  10/22/2023  9:00 AM Wendall Stade, MD [facility]  10/30/2023 10:30 AM Lucky Cowboy, MD [facility] None  02/04/2024  9:30 AM Raynelle Dick, NP [facility] None  07/28/2024  9:00 AM Raynelle Dick, NP [facility] None     HPI 73 y.o.male presents for follow up for transition from recent hospitalization or SNIF stay. Admit date to the hospital was 08/15/23, patient was discharged from the hospital on 08/16/23 and our clinical staff contacted the office the day after discharge to set up a follow up appointment. The discharge summary, medications, and diagnostic test results were reviewed before meeting with the patient. The patient was admitted for:   Transient aphasia/Transient ischemic attack Discharged on Aspirin 81 mg and Plavix 75 mg every day x 21 days then transition to plavix alone. Continue Rosuvastatin 20 mg every day Patient was advised to get cardiac monitor or loop recorder outpatient CTA of head and neck showed no acute intracranial abnormality, no evidence of hemodynamically significant intracranial stenosis, proximal occlusio or aneurysm, no evidence of vascular dissection MRI showed no evidence of acute infarction, did show chronic microvascular ischemic disease    Type 2 Diabetes Mellitus Continue [name], glimepiride 4 mg 1/2-1 tab every day Metformin XR 500 mg 1 tab daily Continue BS checks Monitor  Essential Hypertension Continue Ramipril 5 mg every day    Mr. Garnet Sierras is a 73 year old male with no n insulin dependent type 2 diabetes presented to the emergency room with his wife via EMS for evaluation of abnormal speech. Patient describes having difficulty finding the right word to use. He states he felt like his words were jumbled. Wife said he had left facial drooping and mildly confused. On arrival to the ED symptoms had  resolved. Vitals were normal except BP 157/83. Labs were unremarkable, Blood glucose of 141 Troponin negative. CT scan pf brain was negative for acute pathology. Neurology was consulted in ER and patient was started on  loading dose of aspirin and Plavix. Admitted to hospitalist team to obtain further imaging including MRI and CTA of brain. CTA of head/neck was negative for large vessel occlusion or high grade stenosis. TTE showed no signs of an embolic source.    BP well controlled with Ramipril 5 mg every day , Cardura 8 mg every day  BP Readings from Last 3 Encounters:  08/30/23 120/62  08/29/23 108/60  08/20/23 (!) 108/50  Denies headaches, chest pain, shortness of breath and dizziness   BMI is Body mass index is 28.65 kg/m., he has not been working on diet and exercise. Wt Readings from Last 3 Encounters:  08/30/23 194 lb (88 kg)  08/29/23 191 lb 3.2 oz (86.7 kg)  08/20/23 189 lb (85.7 kg)     Home health is not involved.      Current Outpatient Medications (Endocrine & Metabolic):    Dulaglutide ([name]) 3 MG/0.5ML SOPN, Inject 3 mg as directed once a week.   glimepiride (AMARYL) 4 MG tablet, TAKE 1/2 TO 1 (ONE-HALF TO ONE) TABLET BY MOUTH ONCE DAILY WITH SUPPER FOR DIABETES   metFORMIN (GLUCOPHAGE-XR) 500 MG 24 hr tablet, Take 1 tablets daily with meal   Current Outpatient Medications (Cardiovascular):    doxazosin (CARDURA) 8 MG tablet, Take  1 tablet at Bedtime for BP & Prostate                                                                 /                                                    TAKE                                                               BY                                                   MOUTH   nitroGLYCERIN (NITROSTAT) 0.4 MG SL tablet, Place 1 tablet (0.4 mg total) under the tongue every 5 (five) minutes as needed for chest pain.   ramipril (ALTACE) 5 MG capsule, TAKE 1 CAPSULE BY MOUTH ONCE DAILY FOR  BLOOD  PRESSURE  AND  DIABETIC  KIDNEY  PROTECTION   rosuvastatin (CRESTOR) 20 MG tablet, TAKE 1 TABLET BY MOUTH THREE TIMES A WEEK FOR CHOLESTEROL   Current Outpatient Medications (Respiratory):    albuterol (VENTOLIN HFA) 108 (90 Base) MCG/ACT inhaler,  Inhale 2 puffs into the lungs every 6 (six) hours as needed for wheezing or  shortness of breath.   fexofenadine (ALLEGRA) 60 MG tablet, Take 60 mg by mouth daily as needed for allergies or rhinitis. (Patient not taking: Reported on 08/29/2023)   fluticasone furoate-vilanterol (BREO ELLIPTA) 100-25 MCG/ACT AEPB, Use 1 inhalation Daily for COPD / Asthma   Current Outpatient Medications (Analgesics):    acetaminophen (TYLENOL) 500 MG tablet, Take 2 tablets (1,000 mg total) by mouth every 6 (six) hours. (Patient taking differently: Take 1,000 mg by mouth every 6 (six) hours as needed.)   aspirin EC 81 MG tablet, Take 1 tablet (81 mg total) by mouth daily.   Current Outpatient Medications (Hematological):    clopidogrel (PLAVIX) 75 MG tablet, Take 75 mg by mouth daily.   Current Outpatient Medications (Other):    Cinnamon 500 MG TABS, Take 1 tablet by mouth in the morning and at bedtime.   finasteride (PROSCAR) 5 MG tablet, TAKE 1 TABLET BY MOUTH ONCE DAILY FOR PROSTATE   glucose blood test strip, Test blood sugars once daily.Dx: E11.9   pramipexole (MIRAPEX) 1 MG tablet, 1-2 tabs at bedtime   Facility-Administered Medications Ordered in Other Visits (Other):    regadenoson (LEXISCAN) injection SOLN 0.4 mg   technetium tetrofosmin (TC-MYOVIEW) injection 32.3 millicurie No current facility-administered medications for this visit.  Past Medical History:  Diagnosis Date   Aortic atherosclerosis (HCC)by Chest CT scan 11/2016 12/30/2017   Per chest CT 11/2016     B12 deficiency 04/22/2020   BPH (benign prostatic hyperplasia) 10/05/2014   CAD (coronary artery disease)    a. s/p stent to RCA 2005; b. LHC 2006 after Nuc with inf ischemia: patent RCA stent, D1 80%, LAD 60% => med Rx;  c. LHC 04/18/11: Proximal LAD 60% (unchanged from previous catheterization), distal LAD 65% (small caliber), Dx 60%, proximal CFX 20%, mid CFX 50%, OM1 30%, proximal RCA 20%, mid RCA stent patent, EF 65%.=> med Rx    COPD (chronic obstructive pulmonary disease) (HCC) 10/05/2014   Gallstones 02/21/2021   GERD (gastroesophageal reflux disease)    Hepatic steatosis 02/17/2020   Per CT 02/2020     History of colonic polyps 04/23/2020   HLD (hyperlipidemia)    Hyperlipidemia associated with type 2 diabetes mellitus (HCC) 04/03/2011   Hypertension    ILD (interstitial lung disease) (HCC) 02/21/2021   Suggested by CT 02/2021 - pulmonology referral placed     Mini stroke 08/2023   [facility] hospital   Obesity    OSA (obstructive sleep apnea)    has a cpap-does not use-has a special mouthpiece he wears at night   RLS (restless legs syndrome) 10/05/2014   Type 2 diabetes mellitus (HCC) 06/05/2014   Type II or unspecified type diabetes mellitus without mention of complication, not stated as uncontrolled    Vitamin D deficiency    Wears glasses      No Known Allergies  ROS: all negative except above.   Physical Exam: Filed Weights   08/30/23 1336  Weight: 194 lb (88 kg)   BP 120/62   Pulse 91   Temp 98 F (36.7 C)   Ht 5\' 9"  (1.753 m)   Wt 194 lb (88 kg)   SpO2 99%   BMI 28.65 kg/m  General Appearance: Well nourished, in no apparent distress. Eyes: PERRLA, EOMs, conjunctiva no swelling or erythema Sinuses: No Frontal/maxillary tenderness ENT/Mouth: Ext aud canals clear, TMs without erythema, bulging. No erythema, swelling, or exudate on post pharynx.  Tonsils not swollen or erythematous. Hearing normal.  Neck: Supple, thyroid normal.  Respiratory: Respiratory effort normal, BS diminished but without rales, rhonchi, wheezing or stridor.  Cardio: RRR with no MRGs. Brisk peripheral pulses without edema.  Abdomen: Soft, + BS.  RUQ tenderness Lymphatics: Non tender without lymphadenopathy.  Musculoskeletal: Full ROM, 5/5 strength, normal gait.  Skin: Warm, dry without rashes, lesions, ecchymosis.  Neuro: Cranial nerves intact. Normal muscle tone, no cerebellar symptoms. Sensation intact.  Psych:  Awake and oriented X 3, normal affect, Insight and Judgment appropriate.     Raynelle Dick, NP 2:08 PM [facility]

## 2023-08-30 ENCOUNTER — Ambulatory Visit: Payer: Self-pay | Admitting: Surgery

## 2023-08-30 ENCOUNTER — Encounter: Payer: Self-pay | Admitting: Internal Medicine

## 2023-08-30 ENCOUNTER — Ambulatory Visit (INDEPENDENT_AMBULATORY_CARE_PROVIDER_SITE_OTHER): Payer: Medicare HMO | Admitting: Nurse Practitioner

## 2023-08-30 ENCOUNTER — Telehealth: Payer: Self-pay | Admitting: *Deleted

## 2023-08-30 ENCOUNTER — Encounter: Payer: Self-pay | Admitting: Nurse Practitioner

## 2023-08-30 VITALS — BP 120/62 | HR 91 | Temp 98.0°F | Ht 69.0 in | Wt 194.0 lb

## 2023-08-30 DIAGNOSIS — N182 Chronic kidney disease, stage 2 (mild): Secondary | ICD-10-CM

## 2023-08-30 DIAGNOSIS — J449 Chronic obstructive pulmonary disease, unspecified: Secondary | ICD-10-CM | POA: Diagnosis not present

## 2023-08-30 DIAGNOSIS — G459 Transient cerebral ischemic attack, unspecified: Secondary | ICD-10-CM

## 2023-08-30 DIAGNOSIS — K8050 Calculus of bile duct without cholangitis or cholecystitis without obstruction: Secondary | ICD-10-CM | POA: Diagnosis not present

## 2023-08-30 DIAGNOSIS — I10 Essential (primary) hypertension: Secondary | ICD-10-CM

## 2023-08-30 DIAGNOSIS — J849 Interstitial pulmonary disease, unspecified: Secondary | ICD-10-CM | POA: Diagnosis not present

## 2023-08-30 DIAGNOSIS — E1122 Type 2 diabetes mellitus with diabetic chronic kidney disease: Secondary | ICD-10-CM | POA: Diagnosis not present

## 2023-08-30 NOTE — Telephone Encounter (Signed)
   Pre-operative Risk Assessment    Patient Name: Joe Green  DOB: January 14, 1951 MRN: [id number]   Date of last office visit: 11/09/2022 Date of next office visit: 10/22/2023   Request for Surgical Clearance    Procedure:   Cholecystectomy  Date of Surgery:  Clearance TBD                                 Surgeon:  Dr. Twana First Surgeon's Group or Practice Name:  [facility] Phone number:  [phone] Fax number:  [phone]   Type of Clearance Requested:   - Medical  - Pharmacy:  Hold Aspirin and Clopidogrel (Plavix) Not Indicated.   Type of Anesthesia:  General    Additional requests/questions:    Signed, Emmit Pomfret   08/30/2023, 12:48 PM

## 2023-08-30 NOTE — Patient Instructions (Signed)

## 2023-08-30 NOTE — Telephone Encounter (Signed)
 error

## 2023-08-30 NOTE — H&P (Signed)
Joe Green [id number]   Referring Provider:  Particia Lather, MD Cardiologist: Dr. Eden Emms  Subjective   Chief Complaint: No chief complaint on file.     History of Present Illness:    73 year old male with history of TIA couple weeks ago at [facility] for which he was started on Plavix and aspirin, Aortic atherosclerosis, BPH, coronary artery disease with history of PCI 20 years ago, COPD, GERD, hepatic steatosis, hyperlipidemia, type 2 diabetes, hypertension, CKD 2, interstitial lung disease, obstructive sleep apnea, restless leg syndrome, tobacco abuse, and previous right inguinal hernia repair by Dr. Gerrit Friends in 2016 who presents for evaluation of gallstones.  He reported in mid December at his PCP and aching right flank pain for about a week.  Ultrasound was done demonstrating gallstones but no other concerning findings.  He was evaluated by gastroenterology earlier this month and at that time noted right upper quadrant abdominal pain radiating to the back which had increased in severity from this 30 to about a 7.  Pain is intermittent and occurs with or without eating.  Is not improved by position or bowel movement.  Does note intermittent indigestion, but no nausea or vomiting.  He noted at that visit that he was eating less because he felt full quickly.  Lab work 07/27/2023; CMP is unremarkable, lipid panel unremarkable, vitamin D normal, CBC unremarkable, hemoglobin A1c 7.4, TSH, PSA normal  [place] 08/03/23: Gallstones without sonographic evidence of cholecystitis; common bile duct 3 mm,  Echo 08/16/2023: EF 55 to 60%, normal right ventricular function  Review of Systems: A complete review of systems was obtained from the patient.  I have reviewed this information and discussed as appropriate with the patient.  See HPI as well for other ROS.   Medical History: Past Medical History:  Diagnosis Date   Diabetes mellitus without complication (CMS-HCC)    Sleep apnea     There  is no problem list on file for this patient.   Past Surgical History:  Procedure Laterality Date   HERNIA REPAIR       No Known Allergies  Current Outpatient Medications on File Prior to Visit  Medication Sig Dispense Refill   aspirin 325 MG EC tablet Take 325 mg by mouth once daily.     doxazosin (CARDURA) 8 MG tablet TAKE 1 TABLET BY MOUTH AT BEDTIME FOR BLOOD PRESSURE AND FOR PROSTATE     glimepiride (AMARYL) 4 MG tablet TAKE 1/2 TO 1 (ONE-HALF TO ONE) TABLET BY MOUTH TWICE DAILY WITH MEALS FOR DIABETES     metFORMIN (GLUCOPHAGE) 1000 MG tablet TAKE 1 TABLET BY MOUTH TWICE DAILY WITH MEALS FOR DIABETES     METFORMIN HCL (METFORMIN ORAL) Take by mouth.     ramipriL (ALTACE) 5 MG capsule TAKE 1 CAPSULE BY MOUTH ONCE DAILY FOR BLOOD PRESSURE AND DIABETIC KIDNEY PROTECTION     RAMIPRIL ORAL Take by mouth.     rOPINIRole (REQUIP) 3 MG tablet TAKE 1 TABLET BY MOUTH THREE TIMES DAILY FOR RESTLESS LEGS     rosuvastatin (CRESTOR) 20 MG tablet TAKE 1 TABLET BY MOUTH THREE NIGHTS A WEEK FOR CHOLESTEROL     ROSUVASTATIN CALCIUM (CRESTOR ORAL) Take by mouth.     SAXAGLIPTIN HCL (ONGLYZA ORAL) Take by mouth.     No current facility-administered medications on file prior to visit.    No family history on file.   Social History   Tobacco Use  Smoking Status Former   Current packs/day: 0.00  Types: Cigarettes   Quit date: 2022   Years since quitting: 3.0  Smokeless Tobacco Never     Social History   Socioeconomic History   Marital status: Married  Tobacco Use   Smoking status: Former    Current packs/day: 0.00    Types: Cigarettes    Quit date: 2022    Years since quitting: 3.0   Smokeless tobacco: Never  Vaping Use   Vaping status: Never Used  Substance and Sexual Activity   Alcohol use: Not Currently   Drug use: Defer   Sexual activity: Defer   Social Drivers of Health   Food Insecurity: No Food Insecurity (08/12/2021)   Received from [facility]   Hunger Vital Sign     Worried About Running Out of Food in the Last Year: Never true    Ran Out of Food in the Last Year: Never true  Transportation Needs: No Transportation Needs (08/12/2021)   Received from [facility] - Transportation    Lack of Transportation (Medical): No    Lack of Transportation (Non-Medical): No  Physical Activity: Sufficiently Active (01/01/2018)   Received from [facility]   Exercise Vital Sign    Days of Exercise per Week: 5 days    Minutes of Exercise per Session: 60 min  Stress: No Stress Concern Present (03/10/2019)   Received from [facility] - Occupational Stress Questionnaire    Feeling of Stress : Only a little    Objective:    There were no vitals filed for this visit.  There is no height or weight on file to calculate BMI.  Gen: A&Ox3, no distress  Unlabored respirations Abd soft, mildly tender along the right upper quadrant without peritoneal signs, no mass or organomegaly  Assessment and Plan:  Diagnoses and all orders for this visit:  Biliary colic    I recommend proceeding with laparoscopic or robotic cholecystectomy with possible cholangiogram.  Discussed the relevant anatomy using a diagram to demonstrate, and went over surgical technique.  Discussed risks of surgery including bleeding, infection, pain, scarring, intraabdominal injury specifically to the common bile duct and sequelae, subtotal cholecystectomy, bile leak, conversion to open surgery, failure to resolve symptoms, blood clots/ pulmonary embolus, heart attack, pneumonia, stroke, death. Questions welcomed and answered to patient's satisfaction.  Patient wishes to proceed with surgery. Will request cardiac clearance especially in light of his recent TIA.Marland Kitchen   Kamden Reber Carlye Grippe, MD

## 2023-08-31 LAB — CBC WITH DIFFERENTIAL/PLATELET
Absolute Lymphocytes: 1427 {cells}/uL (ref 850–3900)
Absolute Monocytes: 616 {cells}/uL (ref 200–950)
Basophils Absolute: 31 {cells}/uL (ref 0–200)
Basophils Relative: 0.4 %
Eosinophils Absolute: 445 {cells}/uL (ref 15–500)
Eosinophils Relative: 5.7 %
HCT: 39.6 % (ref 38.5–50.0)
Hemoglobin: 13.1 g/dL — ABNORMAL LOW (ref 13.2–17.1)
MCH: 31.3 pg (ref 27.0–33.0)
MCHC: 33.1 g/dL (ref 32.0–36.0)
MCV: 94.5 fL (ref 80.0–100.0)
MPV: 11.6 fL (ref 7.5–12.5)
Monocytes Relative: 7.9 %
Neutro Abs: 5281 {cells}/uL (ref 1500–7800)
Neutrophils Relative %: 67.7 %
Platelets: 176 10*3/uL (ref 140–400)
RBC: 4.19 10*6/uL — ABNORMAL LOW (ref 4.20–5.80)
RDW: 11.8 % (ref 11.0–15.0)
Total Lymphocyte: 18.3 %
WBC: 7.8 10*3/uL (ref 3.8–10.8)

## 2023-08-31 LAB — COMPLETE METABOLIC PANEL WITH GFR
AG Ratio: 2.1 (calc) (ref 1.0–2.5)
ALT: 17 U/L (ref 9–46)
AST: 12 U/L (ref 10–35)
Albumin: 4.4 g/dL (ref 3.6–5.1)
Alkaline phosphatase (APISO): 107 U/L (ref 35–144)
BUN: 12 mg/dL (ref 7–25)
CO2: 27 mmol/L (ref 20–32)
Calcium: 9.3 mg/dL (ref 8.6–10.3)
Chloride: 102 mmol/L (ref 98–110)
Creat: 0.79 mg/dL (ref 0.70–1.28)
Globulin: 2.1 g/dL (ref 1.9–3.7)
Glucose, Bld: 226 mg/dL — ABNORMAL HIGH (ref 65–99)
Potassium: 4.2 mmol/L (ref 3.5–5.3)
Sodium: 138 mmol/L (ref 135–146)
Total Bilirubin: 0.5 mg/dL (ref 0.2–1.2)
Total Protein: 6.5 g/dL (ref 6.1–8.1)
eGFR: 94 mL/min/{1.73_m2} (ref >=60)

## 2023-08-31 NOTE — Telephone Encounter (Signed)
   Name: HAYDAR MERRETT  DOB: [birth year]  MRN: [id number]  Primary Cardiologist: Charlton Haws, MD  Chart reviewed as part of pre-operative protocol coverage. The patient has an upcoming visit scheduled with Dr. Eden Emms on 10/22/2023 at which time clearance can be addressed in case there are any issues that would impact surgical recommendations.   I added preop FYI to appointment note so that provider is aware to address at time of outpatient visit.  Per office protocol the cardiology provider should forward their finalized clearance decision and recommendations regarding antiplatelet therapy to the requesting party below.    I will route this message as FYI to requesting party and remove this message from the preop box as separate preop APP input not needed at this time.   Please call with any questions.  Napoleon Form, Leodis Rains, NP  08/31/2023, 8:15 AM

## 2023-09-05 ENCOUNTER — Other Ambulatory Visit: Payer: Self-pay | Admitting: Nurse Practitioner

## 2023-09-06 ENCOUNTER — Telehealth: Payer: Self-pay | Admitting: Pulmonary Disease

## 2023-09-06 NOTE — Telephone Encounter (Signed)
Fax received from Dr. Twana First with [facility] to perform a Cholecystectomy under general anesthesia on patient.  Patient needs surgery clearance. Surgery is pending. Patient was seen on . Office protocol is a risk assessment can be sent to surgeon if patient has been seen in 60 days or less.   Pt not seen here since 07/18/22   Will need appt for clearance   I called him and there was no answer- LMTCB

## 2023-09-11 ENCOUNTER — Other Ambulatory Visit: Payer: Self-pay

## 2023-09-11 MED ORDER — CLOPIDOGREL BISULFATE 75 MG PO TABS: 75.0000 mg | ORAL_TABLET | Freq: Every day | ORAL | 1 refills | Status: DC

## 2023-09-18 ENCOUNTER — Encounter: Payer: Self-pay | Admitting: Primary Care

## 2023-09-18 ENCOUNTER — Ambulatory Visit: Payer: Medicare HMO | Admitting: Primary Care

## 2023-09-18 VITALS — BP 114/68 | HR 78 | Temp 97.1°F | Ht 70.0 in | Wt 193.8 lb

## 2023-09-18 DIAGNOSIS — F17200 Nicotine dependence, unspecified, uncomplicated: Secondary | ICD-10-CM | POA: Diagnosis not present

## 2023-09-18 DIAGNOSIS — J449 Chronic obstructive pulmonary disease, unspecified: Secondary | ICD-10-CM

## 2023-09-18 DIAGNOSIS — J849 Interstitial pulmonary disease, unspecified: Secondary | ICD-10-CM

## 2023-09-18 DIAGNOSIS — Z01811 Encounter for preprocedural respiratory examination: Secondary | ICD-10-CM

## 2023-09-18 DIAGNOSIS — Z01818 Encounter for other preprocedural examination: Secondary | ICD-10-CM

## 2023-09-18 LAB — HM DIABETES EYE EXAM

## 2023-09-18 NOTE — Progress Notes (Signed)
 @Patient  ID: Joe Green, male    DOB: 05-Dec-1950, 73 y.o.   MRN: [id number]  Chief Complaint  Patient presents with   Follow-up    Referring provider: Tonita Fallow, MD  HPI: 73 year old male, current someday smoker.  Past medical history significant for hypertension, coronary artery disease, TIA, COPD, ILD, OSA, hyperlipidemia, type 2 diabetes, CKD, restless leg syndrome, vitamin D  deficiency, hepatic steatosis, biliary colic, tobacco abuse.  09/18/2023 Discussed the use of AI scribe software for clinical note transcription with the patient, who gave verbal consent to proceed.  History of Present Illness   Patient presents today for surgical risk assessment. He is awaiting clearance to schedule laparoscopic or robotic cholecystectomy.   His pulmonary fibrosis was last evaluated in December of the previous year, with stable findings. A CT scan reviewed at the ILD conference suggested an alternative diagnosis, and options such as biopsy, watchful waiting, or empiric treatment with an antifibrotic were discussed. Given his stable breathing, a decision was made to conservatively monitor the condition. He has not experienced changes or worsening in his breathing over the past year. He uses Breo daily for his pulmonary condition and a rescue inhaler infrequently, about twice a week. No recent respiratory infections, fevers, or productive cough with colored mucus, although he experiences a persistent cough with mostly clear mucus. He continues to smoke two to three cigarettes a day, despite efforts to quit, and is not using any cessation aids currently.  He has a history of sleep apnea, managed by Doctor Dohmeier. A sleep study conducted in November 2023 indicated mild positional apnea with hypoxemia, but he is not a candidate for [facility] therapy. He does not use nocturnal supplemental oxygen and prefers not to try it.       No Known Allergies  Immunization History  Administered  Date(s) Administered   Influenza, High Dose Seasonal PF 06/22/2017, 08/03/2018, 05/15/2019, 07/28/2020, 06/29/2021, 06/15/2022   Influenza-Unspecified 06/02/2013, 08/18/2014   PFIZER(Purple Top)SARS-COV-2 Vaccination 11/19/2019, 12/10/2019   Pfizer Covid-19 Vaccine Bivalent Booster 19yrs & up 08/11/2020   Pneumococcal Conjugate-13 11/21/2016   Pneumococcal Polysaccharide-23 08/27/2007, 01/01/2018   Td 01/01/2018   Tdap 08/27/2007   Zoster, Live 10/20/2013    Past Medical History:  Diagnosis Date   Aortic atherosclerosis (HCC)by Chest CT scan 11/2016 12/30/2017   Per chest CT 11/2016     B12 deficiency 04/22/2020   BPH (benign prostatic hyperplasia) 10/05/2014   CAD (coronary artery disease)    a. s/p stent to RCA 2005; b. LHC 2006 after Nuc with inf ischemia: patent RCA stent, D1 80%, LAD 60% => med Rx;  c. LHC 04/18/11: Proximal LAD 60% (unchanged from previous catheterization), distal LAD 65% (small caliber), Dx 60%, proximal CFX 20%, mid CFX 50%, OM1 30%, proximal RCA 20%, mid RCA stent patent, EF 65%.=> med Rx   COPD (chronic obstructive pulmonary disease) (HCC) 10/05/2014   Gallstones 02/21/2021   GERD (gastroesophageal reflux disease)    Hepatic steatosis 02/17/2020   Per CT 02/2020     History of colonic polyps 04/23/2020   HLD (hyperlipidemia)    Hyperlipidemia associated with type 2 diabetes mellitus (HCC) 04/03/2011   Hypertension    ILD (interstitial lung disease) (HCC) 02/21/2021   Suggested by CT 02/2021 - pulmonology referral placed     Mini stroke 08/2023   [facility]   Obesity    OSA (obstructive sleep apnea)    has a cpap-does not use-has a special mouthpiece he wears at night   RLS (  restless legs syndrome) 10/05/2014   Type 2 diabetes mellitus (HCC) 06/05/2014   Type II or unspecified type diabetes mellitus without mention of complication, not stated as uncontrolled    Vitamin D  deficiency    Wears glasses     Tobacco History: Social History   Tobacco  Use  Smoking Status Every Day   Current packs/day: 0.00   Average packs/day: 0.5 packs/day for 53.0 years (26.5 ttl pk-yrs)   Types: Cigarettes   Start date: 43   Last attempt to quit: 2023   Years since quitting: 2.0   Passive exposure: Current  Smokeless Tobacco Never  Tobacco Comments   Pt smokes 2-3 cigs a day. AB, CMA 09-18-23   Ready to quit: Not Answered Counseling given: Not Answered Tobacco comments: Pt smokes 2-3 cigs a day. AB, CMA 09-18-23   Outpatient Medications Prior to Visit  Medication Sig Dispense Refill   acetaminophen  (TYLENOL ) 500 MG tablet Take 2 tablets (1,000 mg total) by mouth every 6 (six) hours. (Patient taking differently: Take 1,000 mg by mouth every 6 (six) hours as needed.) 30 tablet 0   albuterol  (VENTOLIN  HFA) 108 (90 Base) MCG/ACT inhaler Inhale 2 puffs into the lungs every 6 (six) hours as needed for wheezing or shortness of breath. 8 g 2   aspirin  EC 81 MG tablet Take 1 tablet (81 mg total) by mouth daily. 90 tablet 3   Cinnamon 500 MG TABS Take 1 tablet by mouth in the morning and at bedtime.     clopidogrel  (PLAVIX ) 75 MG tablet Take 1 tablet (75 mg total) by mouth daily. 30 tablet 1   doxazosin  (CARDURA ) 8 MG tablet Take  1 tablet at Bedtime for BP & Prostate                                                                 /                                                    TAKE                                                               BY                                                   MOUTH 90 tablet 3   Dulaglutide  ([name] ) 3 MG/0.5ML SOPN Inject 3 mg as directed once a week. 2 mL 3   fexofenadine (ALLEGRA) 60 MG tablet Take 60 mg by mouth daily as needed for allergies or rhinitis.     finasteride  (PROSCAR ) 5 MG tablet TAKE 1 TABLET BY MOUTH ONCE DAILY FOR PROSTATE 90 tablet 0   fluticasone  furoate-vilanterol (BREO ELLIPTA ) 100-25 MCG/ACT AEPB Use 1 inhalation Daily  for COPD / Asthma 180 each 3   glimepiride  (AMARYL ) 4 MG tablet TAKE  1/2 TO 1 (ONE-HALF TO ONE) TABLET BY MOUTH ONCE DAILY WITH SUPPER FOR DIABETES 90 tablet 2   glucose blood test strip Test blood sugars once daily.Dx: E11.9 100 each 2   metFORMIN  (GLUCOPHAGE -XR) 500 MG 24 hr tablet Take 1 tablets daily with meal 90 tablet 3   nitroGLYCERIN  (NITROSTAT ) 0.4 MG SL tablet Place 1 tablet (0.4 mg total) under the tongue every 5 (five) minutes as needed for chest pain. 25 tablet 3   pramipexole  (MIRAPEX ) 1 MG tablet 1-2 tabs at bedtime 180 tablet 2   ramipril  (ALTACE ) 5 MG capsule TAKE 1 CAPSULE BY MOUTH ONCE DAILY FOR  BLOOD  PRESSURE  AND  DIABETIC  KIDNEY  PROTECTION 90 capsule 0   rosuvastatin  (CRESTOR ) 20 MG tablet TAKE 1 TABLET BY MOUTH THREE TIMES A WEEK FOR CHOLESTEROL 36 tablet 0   Facility-Administered Medications Prior to Visit  Medication Dose Route Frequency Provider Last Rate Last Admin   regadenoson  (LEXISCAN ) injection SOLN 0.4 mg  0.4 mg Intravenous Once Crenshaw, Brian S, MD       technetium tetrofosmin  (TC-MYOVIEW ) injection 32.3 millicurie  32.3 millicurie Intravenous Once PRN Pietro Redell RAMAN, MD       Review of Systems  Review of Systems  Constitutional: Negative.   HENT: Negative.    Respiratory:  Positive for cough. Negative for shortness of breath.   Cardiovascular: Negative.    Physical Exam  BP 114/68 (BP Location: Right Arm, Patient Position: Sitting, Cuff Size: Large)   Pulse 78   Temp (!) 97.1 F (36.2 C) (Temporal)   Ht 5' 10 (1.778 m)   Wt 193 lb 12.8 oz (87.9 kg)   SpO2 95%   BMI 27.81 kg/m  Physical Exam Constitutional:      Appearance: Normal appearance. He is not ill-appearing.  HENT:     Head: Normocephalic and atraumatic.  Cardiovascular:     Rate and Rhythm: Normal rate and regular rhythm.  Musculoskeletal:        General: Normal range of motion.     Cervical back: Neck supple.  Skin:    General: Skin is warm and dry.  Neurological:     General: No focal deficit present.     Mental Status: He is alert  and oriented to person, place, and time. Mental status is at baseline.  Psychiatric:        Mood and Affect: Mood normal.        Behavior: Behavior normal.        Thought Content: Thought content normal.        Judgment: Judgment normal.      Lab Results:  CBC    Component Value Date/Time   WBC 7.8 08/30/2023 1404   RBC 4.19 (L) 08/30/2023 1404   HGB 13.1 (L) 08/30/2023 1404   HCT 39.6 08/30/2023 1404   PLT 176 08/30/2023 1404   MCV 94.5 08/30/2023 1404   MCH 31.3 08/30/2023 1404   MCHC 33.1 08/30/2023 1404   RDW 11.8 08/30/2023 1404   LYMPHSABS 1,959 05/01/2023 1058   MONOABS 1.1 (H) 10/31/2022 1018   EOSABS 445 08/30/2023 1404   BASOSABS 31 08/30/2023 1404    BMET    Component Value Date/Time   NA 138 08/30/2023 1404   K 4.2 08/30/2023 1404   CL 102 08/30/2023 1404   CO2 27 08/30/2023 1404   GLUCOSE 226 (H) 08/30/2023 1404  BUN 12 08/30/2023 1404   CREATININE 0.79 08/30/2023 1404   CALCIUM  9.3 08/30/2023 1404   GFRNONAA >60 10/31/2022 1018   GFRNONAA 97 10/28/2020 1017   GFRAA 113 10/28/2020 1017    BNP No results found for: BNP  ProBNP No results found for: PROBNP  Imaging: No results found.   Assessment & Plan:   1. Pre-operative respiratory examination (Primary)  2. ILD (interstitial lung disease) (HCC)  3. Chronic obstructive pulmonary disease, unspecified COPD type (HCC)  4. Smoking      Pulmonary Fibrosis Stable with no worsening of symptoms.  Pulmonary function testing in December 2023 showed normal lung function.  Decision made to conservatively monitor without intervention or antifibrotic treatment. -Continue current management and monitoring.  Tobacco Use Reduced to 2-3 cigarettes per day. No current use of cessation aids. -Encourage continued efforts to quit smoking.  Sleep Apnea Split-night sleep study in November 2023 showed mild-moderate obstructive sleep apnea, AHI 17.7  (events were primarily during supine position)  and nocturnal hypoxemia (O2 nadir 78% with mean saturation 93%). Patient Not currently using CPAP or nocturnal supplemental oxygen. Patient is not candidate for Inspire device -Continue current management and consider positional changes during sleep (e.g., elevating head).  Preoperative Assessment for Gallbladder Removal Intermediate risk due to age, pulmonary fibrosis and sleep apnea/nocturnal hypoxemia.  -Recommend surgery be performed in a hospital setting. -Ensure anesthesiologist is aware of sleep apnea history. -Encourage postoperative mobility and use of incentive spirometer to prevent postoperative pneumonia or respiratory failure. -Use Breo inhaler on the day of surgery. -Recommend preoperative chest x-ray if not already planned by surgical team.   1) RISK FOR PROLONGED MECHANICAL VENTILAION - > 48h  1A) Arozullah - Prolonged mech ventilation risk Arozullah Postperative Pulmonary Risk Score - for mech ventilation dependence >48h Usaa, Ann Surg 2000, major non-cardiac surgery) Comment Score  Type of surgery - abd ao aneurysm (27), thoracic (21), neurosurgery / upper abdominal / vascular (21), neck (11) Lap chole 6  Emergency Surgery - (11)  0  ALbumin < 3 or poor nutritional state - (9)  0  BUN > 30 -  (8)  0  Partial or completely dependent functional status - (7)  0  COPD -  (6)  6  Age - 60 to 69 (4), > 70  (6)  6  TOTAL  18  Risk Stratifcation scores  - < 10 (0.5%), 11-19 (1.8%), 20-27 (4.2%), 28-40 (10.1%), >40 (26.6%)  1.8% risk for mech ventilation dependence >48%    1B) GUPTA - Prolonged Mech Vent Risk Score source Risk  Guptal post op prolonged mech ventilation > 48h or reintubation < 30 days - ACS 2007-2008 dataset - [email or web address].nl 0.8 % Risk of mechanical ventilation for >48 hrs after surgery, or unplanned intubation <=30 days of surgery    2) RISK FOR POST OP PNEUMONIA Score source Risk  Charlanne -  Post Op Pnemounia risk  [email or web address] 0.9 % Risk of postoperative pneumonia    R3) ISK FOR ANY POST-OP PULMONARY COMPLICATION Score source Risk  CANET/ARISCAT Score - risk for ANY/ALl pulmonary complications - > risk of in-hospital post-op pulmonary complications (composite including respiratory failure, respiratory infection, pleural effusion, atelectasis, pneumothorax, bronchospasm, aspiration pneumonitis) [email or web address] - based on age, anemia, pulse ox, resp infection prior 30d, incision site, duration of surgery, and emergency v elective surgery Intermediate risk 13.3% risk of in-hospital post-op pulmonary complications (composite including respiratory failure, respiratory infection, pleural effusion, atelectasis, pneumothorax, bronchospasm, aspiration pneumonitis)    Follow-up 3-6  months with Dr. Theophilus or sooner if needed         Almarie LELON Ferrari, NP 09/18/2023

## 2023-09-18 NOTE — Patient Instructions (Signed)
 -  PULMONARY FIBROSIS: Pulmonary fibrosis is a lung disease that occurs when lung tissue becomes damaged and scarred. Your condition is stable, and we will continue to monitor it without any new treatments. Please continue using Breo daily and your rescue inhaler as needed.  -TOBACCO USE: Smoking can worsen your lung condition and overall health. You have reduced your smoking to 2-3 cigarettes per day. We encourage you to continue your efforts to quit smoking entirely.  -SLEEP APNEA: Sleep apnea is a condition where breathing repeatedly stops and starts during sleep. Your mild positional sleep apnea. Avoid sleeping flat on your back by elevating your head during sleep to help manage this condition.  -PREOPERATIVE ASSESSMENT FOR GALLBLADDER REMOVAL: Given your age and lung condition, your surgery carries an intermediate risk. It is recommended to have the surgery in a hospital setting, inform the anesthesiologist about your sleep apnea, and use your Breo inhaler on the day of surgery. Postoperative mobility and using an incentive spirometer are important to prevent complications. A preoperative chest x-ray is also recommended if not already planned.  Follow-up: 3-6 months with Dr. Theophilus

## 2023-09-28 NOTE — Progress Notes (Addendum)
COVID Vaccine Completed:  Yes  Date of COVID positive in last 90 days:  No  PCP -  No PCP, prior was Dr. Oneta Rack Cardiologist - Charlton Haws, MD (due to see cardiology 10/22/23) Pulmonologist - Ames Dura, NP  Pulmonary clearance in Epic (Intermediate Risk)  Chest x-ray - N/A EKG - 08-15-23 Epic Stress Test - Several years ago ECHO - 08-16-23 Epic Cardiac Cath - Yes Pacemaker/ICD device last checked: Spinal Cord Stimulator:  N/A  Bowel Prep - N/A  Sleep Study - Yes, +sleep apnea CPAP - No  Fasting Blood Sugar - 130 or less  Checks Blood Sugar - 1 times a day  [name] Last dose of GLP1 agonist-  10-02-23 GLP1 instructions:  Hold until after surgery     Last dose of SGLT-2 inhibitors-  N/A SGLT-2 instructions:  Hold 3 days before surgery   Blood Thinner Instructions: Plavix  Last dose: 10-01-23 at 10 PM Aspirin Instructions:  N/A Last Dose:   Activity level:  Can go up a flight of stairs and perform activities of daily living without stopping and without symptoms of chest pain or shortness of breath.  Anesthesia review:  CAD, HTN, COPD, OSA, ILD  Patient denies shortness of breath, fever, cough and chest pain at PAT appointment  Patient verbalized understanding of instructions that were given to them at the PAT appointment. Patient was also instructed that they will need to review over the PAT instructions again at home before surgery.

## 2023-09-28 NOTE — Patient Instructions (Addendum)
SURGICAL WAITING ROOM VISITATION Patients having surgery or a procedure may have no more than 2 support people in the waiting area - these visitors may rotate.    Children under the age of 82 must have an adult with them who is not the patient.  Due to an increase in RSV and influenza rates and associated hospitalizations, children ages 24 and under may not visit patients in [facility] hospitals.   If the patient needs to stay at the hospital during part of their recovery, the visitor guidelines for inpatient rooms apply. Pre-op nurse will coordinate an appropriate time for 1 support person to accompany patient in pre-op.  This support person may not rotate.    Please refer to the [facility] website for the visitor guidelines for Inpatients (after your surgery is over and you are in a regular room).       Your procedure is scheduled on: 10-09-23   Report to [facility] Main Entrance    Report to admitting at 10:00 AM   Call this number if you have problems the morning of surgery [phone]   Do not eat food or drink liquids: After Midnight.           If you have questions, please contact your surgeon's office.   FOLLOW  ANY ADDITIONAL PRE OP INSTRUCTIONS YOU RECEIVED FROM YOUR SURGEON'S OFFICE!!!     Oral Hygiene is also important to reduce your risk of infection.                                    Remember - BRUSH YOUR TEETH THE MORNING OF SURGERY WITH YOUR REGULAR TOOTHPASTE   Do NOT smoke after Midnight   Take these medicines the morning of surgery with A SIP OF WATER:    Allegra   Finasteride   Rosuvastatin   Okay to use inhalers   Tylenol if needed  Stop all vitamins and herbal supplements 7 days before surgery  How to Manage Your Diabetes Before and After Surgery  Why is it important to control my blood sugar before and after surgery? Improving blood sugar levels before and after surgery helps healing and can limit problems. A way of improving blood  sugar control is eating a healthy diet by:  Eating less sugar and carbohydrates  Increasing activity/exercise  Talking with your doctor about reaching your blood sugar goals High blood sugars (greater than 180 mg/dL) can raise your risk of infections and slow your recovery, so you will need to focus on controlling your diabetes during the weeks before surgery. Make sure that the doctor who takes care of your diabetes knows about your planned surgery including the date and location.  How do I manage my blood sugar before surgery? Check your blood sugar at least 4 times a day, starting 2 days before surgery, to make sure that the level is not too high or low. Check your blood sugar the morning of your surgery when you wake up and every 2 hours until you get to the Short Stay unit. If your blood sugar is less than 70 mg/dL, you will need to treat for low blood sugar: Do not take insulin. Treat a low blood sugar (less than 70 mg/dL) with  cup of clear juice (cranberry or apple), 4 glucose tablets, OR glucose gel. Recheck blood sugar in 15 minutes after treatment (to make sure it is greater than 70  mg/dL). If your blood sugar is not greater than 70 mg/dL on recheck, call [phone] for further instructions. Report your blood sugar to the short stay nurse when you get to Short Stay.  If you are admitted to the hospital after surgery: Your blood sugar will be checked by the staff and you will probably be given insulin after surgery (instead of oral diabetes medicines) to make sure you have good blood sugar levels. The goal for blood sugar control after surgery is 80-180 mg/dL.   WHAT DO I DO ABOUT MY DIABETES MEDICATION?  Do not take oral diabetes medicines (pills) the morning of surgery.  Hold [name] 7 days before surgery (do not take after 10-01-23)   Do not take an evening dose of Glimeperide the night before surgery .  DO NOT TAKE THE FOLLOWING 7 DAYS PRIOR TO SURGERY: Ozempic, Wegovy,  Rybelsus (Semaglutide), Byetta (exenatide), Bydureon (exenatide ER), Victoza, Saxenda (liraglutide), or [name] (dulaglutide) Mounjaro (Tirzepatide) Adlyxin (Lixisenatide), Polyethylene Glycol Loxenatide.  Reviewed and Endorsed by [facility] Patient Education Committee, August 2015                              You may not have any metal on your body including jewelry, and body piercing             Do not wear  lotions, powders, cologne, or deodorant  Men may shave face and neck    Do not bring valuables to the hospital. [facility] IS NOT RESPONSIBLE   FOR VALUABLES.   Contacts, dentures or bridgework may not be worn into surgery.  DO NOT BRING YOUR HOME MEDICATIONS TO THE HOSPITAL. PHARMACY WILL DISPENSE MEDICATIONS LISTED ON YOUR MEDICATION LIST TO YOU DURING YOUR ADMISSION IN THE HOSPITAL!    Patients discharged on the day of surgery will not be allowed to drive home.  Someone NEEDS to stay with you for the first 24 hours after anesthesia.   Special Instructions: Bring a copy of your healthcare power of attorney and living will documents the day of surgery if you haven't scanned them before.              Please read over the following fact sheets you were given: IF YOU HAVE QUESTIONS ABOUT YOUR PRE-OP INSTRUCTIONS PLEASE CALL [phone] Gwen  If you received a COVID test during your pre-op visit  it is requested that you wear a mask when out in public, stay away from anyone that may not be feeling well and notify your surgeon if you develop symptoms. If you test positive for Covid or have been in contact with anyone that has tested positive in the last 10 days please notify you surgeon.  [facility] - Preparing for Surgery Before surgery, you can play an important role.  Because skin is not sterile, your skin needs to be as free of germs as possible.  You can reduce the number of germs on your skin by washing with CHG (chlorahexidine gluconate) soap before surgery.  CHG is an  antiseptic cleaner which kills germs and bonds with the skin to continue killing germs even after washing. Please DO NOT use if you have an allergy to CHG or antibacterial soaps.  If your skin becomes reddened/irritated stop using the CHG and inform your nurse when you arrive at Short Stay. Do not shave (including legs and underarms) for at least 48 hours prior to the first CHG shower.  You may  shave your face/neck.  Please follow these instructions carefully:  1.  Shower with CHG Soap the night before surgery and the  morning of surgery.  2.  If you choose to wash your hair, wash your hair first as usual with your normal  shampoo.  3.  After you shampoo, rinse your hair and body thoroughly to remove the shampoo.                             4.  Use CHG as you would any other liquid soap.  You can apply chg directly to the skin and wash.  Gently with a scrungie or clean washcloth.  5.  Apply the CHG Soap to your body ONLY FROM THE NECK DOWN.   Do   not use on face/ open                           Wound or open sores. Avoid contact with eyes, ears mouth and   genitals (private parts).                       Wash face,  Genitals (private parts) with your normal soap.             6.  Wash thoroughly, paying special attention to the area where your    surgery  will be performed.  7.  Thoroughly rinse your body with warm water from the neck down.  8.  DO NOT shower/wash with your normal soap after using and rinsing off the CHG Soap.                9.  Pat yourself dry with a clean towel.            10.  Wear clean pajamas.            11.  Place clean sheets on your bed the night of your first shower and do not  sleep with pets. Day of Surgery : Do not apply any lotions/deodorants the morning of surgery.  Please wear clean clothes to the hospital/surgery center.  FAILURE TO FOLLOW THESE INSTRUCTIONS MAY RESULT IN THE CANCELLATION OF YOUR SURGERY  PATIENT  SIGNATURE_________________________________  NURSE SIGNATURE__________________________________  ________________________________________________________________________

## 2023-10-02 ENCOUNTER — Telehealth: Payer: Self-pay | Admitting: *Deleted

## 2023-10-02 ENCOUNTER — Encounter: Payer: Self-pay | Admitting: General Practice

## 2023-10-02 ENCOUNTER — Ambulatory Visit: Payer: Medicare HMO | Attending: General Practice | Admitting: General Practice

## 2023-10-02 ENCOUNTER — Other Ambulatory Visit: Payer: Self-pay

## 2023-10-02 ENCOUNTER — Encounter (HOSPITAL_COMMUNITY): Payer: Self-pay

## 2023-10-02 ENCOUNTER — Encounter (HOSPITAL_COMMUNITY)
Admission: RE | Admit: 2023-10-02 | Discharge: 2023-10-02 | Disposition: A | Discharge status: Home / Self Care | Payer: Medicare HMO | Source: Ambulatory Visit | Attending: Surgery

## 2023-10-02 VITALS — BP 119/69 | HR 70 | Temp 97.8°F | Resp 20 | Ht 70.5 in | Wt 188.8 lb

## 2023-10-02 VITALS — BP 120/58 | HR 75 | Ht 70.5 in | Wt 191.0 lb

## 2023-10-02 DIAGNOSIS — J449 Chronic obstructive pulmonary disease, unspecified: Secondary | ICD-10-CM | POA: Insufficient documentation

## 2023-10-02 DIAGNOSIS — E119 Type 2 diabetes mellitus without complications: Secondary | ICD-10-CM | POA: Diagnosis not present

## 2023-10-02 DIAGNOSIS — K8050 Calculus of bile duct without cholangitis or cholecystitis without obstruction: Secondary | ICD-10-CM | POA: Diagnosis not present

## 2023-10-02 DIAGNOSIS — Z01818 Encounter for other preprocedural examination: Secondary | ICD-10-CM

## 2023-10-02 DIAGNOSIS — N400 Enlarged prostate without lower urinary tract symptoms: Secondary | ICD-10-CM | POA: Diagnosis not present

## 2023-10-02 DIAGNOSIS — E782 Mixed hyperlipidemia: Secondary | ICD-10-CM

## 2023-10-02 DIAGNOSIS — I2510 Atherosclerotic heart disease of native coronary artery without angina pectoris: Secondary | ICD-10-CM | POA: Diagnosis not present

## 2023-10-02 DIAGNOSIS — Z01810 Encounter for preprocedural cardiovascular examination: Secondary | ICD-10-CM | POA: Diagnosis not present

## 2023-10-02 DIAGNOSIS — Z7984 Long term (current) use of oral hypoglycemic drugs: Secondary | ICD-10-CM | POA: Insufficient documentation

## 2023-10-02 DIAGNOSIS — Z7902 Long term (current) use of antithrombotics/antiplatelets: Secondary | ICD-10-CM | POA: Insufficient documentation

## 2023-10-02 DIAGNOSIS — I10 Essential (primary) hypertension: Secondary | ICD-10-CM | POA: Insufficient documentation

## 2023-10-02 DIAGNOSIS — G4733 Obstructive sleep apnea (adult) (pediatric): Secondary | ICD-10-CM | POA: Insufficient documentation

## 2023-10-02 DIAGNOSIS — Z720 Tobacco use: Secondary | ICD-10-CM | POA: Insufficient documentation

## 2023-10-02 DIAGNOSIS — Z7982 Long term (current) use of aspirin: Secondary | ICD-10-CM | POA: Insufficient documentation

## 2023-10-02 DIAGNOSIS — Z955 Presence of coronary angioplasty implant and graft: Secondary | ICD-10-CM | POA: Diagnosis not present

## 2023-10-02 DIAGNOSIS — Z01812 Encounter for preprocedural laboratory examination: Secondary | ICD-10-CM | POA: Diagnosis present

## 2023-10-02 DIAGNOSIS — I700 Atherosclerosis of aorta: Secondary | ICD-10-CM | POA: Diagnosis not present

## 2023-10-02 HISTORY — DX: Unspecified osteoarthritis, unspecified site: M19.90

## 2023-10-02 LAB — CBC
HCT: 41.7 % (ref 39.0–52.0)
Hemoglobin: 13.6 g/dL (ref 13.0–17.0)
MCH: 30.9 pg (ref 26.0–34.0)
MCHC: 32.6 g/dL (ref 30.0–36.0)
MCV: 94.8 fL (ref 80.0–100.0)
Platelets: 170 10*3/uL (ref 150–400)
RBC: 4.4 MIL/uL (ref 4.22–5.81)
RDW: 12.4 % (ref 11.5–15.5)
WBC: 7.9 10*3/uL (ref 4.0–10.5)
nRBC: 0 % (ref 0.0–0.2)

## 2023-10-02 LAB — BASIC METABOLIC PANEL
Anion gap: 11 (ref 5–15)
BUN: 12 mg/dL (ref 8–23)
CO2: 23 mmol/L (ref 22–32)
Calcium: 9.3 mg/dL (ref 8.9–10.3)
Chloride: 102 mmol/L (ref 98–111)
Creatinine, Ser: 0.72 mg/dL (ref 0.61–1.24)
GFR, Estimated: >60 mL/min (ref >60)
Glucose, Bld: 136 mg/dL — ABNORMAL HIGH (ref 70–99)
Potassium: 4.1 mmol/L (ref 3.5–5.1)
Sodium: 136 mmol/L (ref 135–145)

## 2023-10-02 LAB — HEMOGLOBIN A1C
Hgb A1c MFr Bld: 6.6 % — ABNORMAL HIGH (ref 4.8–5.6)
Mean Plasma Glucose: 142.72 mg/dL

## 2023-10-02 LAB — GLUCOSE, CAPILLARY: Glucose-Capillary: 128 mg/dL — ABNORMAL HIGH (ref 70–99)

## 2023-10-02 NOTE — Telephone Encounter (Signed)
Dr. Eden Emms,   Joe Green has a history of a long DES to mid RCA placed in May 2005. Last LHC in September 2012 demonstrated stable 3 vessel CAD with widely patent RCA stent.  Per office protocol, will you please provide recommendations for holding Plavix and aspirin prior to laparoscopic cholecystectomy on 10/09/2023?  Please route your response to P CV DIV Preop. I will communicate with requesting office once you have given recommendations.   Thank you!  Carlos Levering, NP

## 2023-10-02 NOTE — Patient Instructions (Signed)
Medication Instructions:  The current medical regimen is effective;  continue present plan and medications as directed. Please refer to the Current Medication list given to you today.  *If you need a refill on your cardiac medications before your next appointment, please call your pharmacy*  Lab Work: NONE  Other Instructions CLEARED FOR UPCOMING SURGERY  Follow-Up: At [facility], you and your health needs are our priority.  As part of our continuing mission to provide you with exceptional heart care, we have created designated Provider Care Teams.  These Care Teams include your primary Cardiologist (physician) and Advanced Practice Providers (APPs -  Physician Assistants and Nurse Practitioners) who all work together to provide you with the care you need, when you need it.  We recommend signing up for the patient portal called "MyChart".  Sign up information is provided on this After Visit Summary.  MyChart is used to connect with patients for Virtual Visits (Telemedicine).  Patients are able to view lab/test results, encounter notes, upcoming appointments, etc.  Non-urgent messages can be sent to your provider as well.   To learn more about what you can do with MyChart, go to [email or web address].    Your next appointment:   9-12 month(s)  Provider:   Charlton Haws, MD  or Edd Fabian, FNP

## 2023-10-02 NOTE — Progress Notes (Signed)
Cardiology Clinic Note   Patient Name: Joe Green Date of Encounter: 10/02/2023  Primary Care Provider:  Patient, No Pcp Per Primary Cardiologist:  Charlton Haws, MD  Patient Profile    Joe Green 73 year old male presents the clinic today for follow-up evaluation of his coronary artery disease, hypertension, and preoperative cardiac evaluation.  Past Medical History    Past Medical History:  Diagnosis Date   Aortic atherosclerosis (HCC)by Chest CT scan 11/2016 12/30/2017   Per chest CT 11/2016     Arthritis    B12 deficiency 04/22/2020   BPH (benign prostatic hyperplasia) 10/05/2014   CAD (coronary artery disease)    a. s/p stent to RCA 2005; b. LHC 2006 after Nuc with inf ischemia: patent RCA stent, D1 80%, LAD 60% => med Rx;  c. LHC 04/18/11: Proximal LAD 60% (unchanged from previous catheterization), distal LAD 65% (small caliber), Dx 60%, proximal CFX 20%, mid CFX 50%, OM1 30%, proximal RCA 20%, mid RCA stent patent, EF 65%.=> med Rx   COPD (chronic obstructive pulmonary disease) (HCC) 10/05/2014   Gallstones 02/21/2021   GERD (gastroesophageal reflux disease)    Hepatic steatosis 02/17/2020   Per CT 02/2020     History of colonic polyps 04/23/2020   HLD (hyperlipidemia)    Hyperlipidemia associated with type 2 diabetes mellitus (HCC) 04/03/2011   Hypertension    ILD (interstitial lung disease) (HCC) 02/21/2021   Suggested by CT 02/2021 - pulmonology referral placed     Mini stroke 08/2023   [facility] [address] [facility]   Obesity    OSA (obstructive sleep apnea)    has a cpap-does not use-has a special mouthpiece he wears at night   RLS (restless legs syndrome) 10/05/2014   Type 2 diabetes mellitus (HCC) 06/05/2014   Type II or unspecified type diabetes mellitus without mention of complication, not stated as uncontrolled    Vitamin D deficiency    Wears glasses    Past Surgical History:  Procedure Laterality Date   CARDIAC CATHETERIZATION  2005   stent    CHONDROPLASTY  06/25/2017   Procedure: CHONDROPLASTY;  Surgeon: Jodi Geralds, MD;  Location: [facility];  Service: Orthopedics;;   COLONOSCOPY  11/2011   Due 5 years Dr. Russella Dar   heart stent  2005   RCA   INGUINAL HERNIA REPAIR Right 11/16/2014   Procedure: RIGHT INGUINAL HERNIA REPAIR WITH MESH;  Surgeon: Darnell Level, MD;  Location: [facility];  Service: General;  Laterality: Right;   INSERTION OF MESH Right 11/16/2014   Procedure: INSERTION OF MESH;  Surgeon: Darnell Level, MD;  Location: [facility];  Service: General;  Laterality: Right;   KNEE ARTHROSCOPY WITH MEDIAL MENISECTOMY Bilateral 06/25/2017   Procedure: right knee arthroscopy with medial meniscectomy, chondroplasty, Left knee arthroscopy with medial menisectomy removal of plica and chondroplasty;  Surgeon: Jodi Geralds, MD;  Location: [facility];  Service: Orthopedics;  Laterality: Bilateral;   POLYPECTOMY      Allergies  No Known Allergies  History of Present Illness    Joe Green has a PMH of coronary artery disease, GERD, hyperlipidemia, HTN, obesity, OSA, and type 2 diabetes.  He is status post stenting to his RCA in 2005, LHC 2006 after nuke showing ischemia, LHC 9/12 proximal LAD 60%, distal LAD 65%, Dx 60%, proximal circumflex 20%, mid circumflex 50%, OM1 30%, proximal RCA 20%, mid RCA stent patent, EF 65% and medical management was recommended.  He had normal NST  12/20.  Normal ABIs 05/20/2020.  He presented to the emergency department 3/24 with lumbar strain and flank pain.  He was noted to have a nonobstructing ureteral stone.  He received Valium and Norco.  He has bulbous emphysema and ILD and follows with pulmonology.  He was referred for lung biopsy with Dr. Dorris Fetch.  He is a former smoker.  He is still smoking half pack per day.  He previously started Chantix patches 07/18/2022 and was previously not interested in CPAP.  He was seen in follow-up  11/09/2022 by Dr. Eden Emms.  He reported occasional pressure that would ease within minutes.  It was not associated with activity.  He noted symptoms randomly.  He had CT which showed aortic atherosclerosis which was reviewed.  He continued to do projects around his house in his workshop.  He was not exercising regularly due to his activity at home.  He did report having a stationary bike.  He has stopped smoking.  He is a retired Psychologist, occupational.  He presents to the clinic today for follow-up evaluation and states he continues to do well.  His blood pressure today is 120/58.  He is having some trouble with his prostate and his prostate medications.  I asked him to follow-up with his urologist.  He expressed understanding.  He continues to be physically active around his house.  We reviewed his upcoming surgery.  He may hold his aspirin and Plavix for 5 days prior to his procedure.  We will have him follow-up in 9 to 12 months..  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      Home Medications    Prior to Admission medications   Medication Sig Start Date End Date Taking? Authorizing Provider  acetaminophen (TYLENOL) 500 MG tablet Take 2 tablets (1,000 mg total) by mouth every 6 (six) hours. Patient taking differently: Take 1,000 mg by mouth every 6 (six) hours as needed for moderate pain (pain score 4-6). 01/18/19   Focht, Joyce Copa, PA  albuterol (VENTOLIN HFA) 108 (90 Base) MCG/ACT inhaler Inhale 2 puffs into the lungs every 6 (six) hours as needed for wheezing or shortness of breath. 06/29/22   Raynelle Dick, NP  aspirin EC 81 MG tablet Take 1 tablet (81 mg total) by mouth daily. Patient not taking: Reported on 09/28/2023 11/17/16   Janetta Hora, PA-C  Cinnamon 500 MG TABS Take 500 mg by mouth daily.    [provider]  clopidogrel  (PLAVIX) 75 MG tablet Take 1 tablet (75 mg total) by mouth daily. 09/11/23   Adela Glimpse, NP  doxazosin (CARDURA) 8 MG tablet Take  1 tablet at Bedtime for BP & Prostate                                                                 /                                                    TAKE  BY                                                   MOUTH 01/01/23   Lucky Cowboy, MD  Dulaglutide ([name]) 3 MG/0.5ML SOPN Inject 3 mg as directed once a week. 03/30/22   Raynelle Dick, NP  fexofenadine (ALLEGRA) 60 MG tablet Take 60 mg by mouth daily as needed for allergies or rhinitis.    [provider]  finasteride (PROSCAR) 5 MG tablet TAKE 1 TABLET BY MOUTH ONCE DAILY FOR PROSTATE 03/14/23   Raynelle Dick, NP  fluticasone furoate-vilanterol (BREO ELLIPTA) 100-25 MCG/ACT AEPB Use 1 inhalation Daily for COPD / Asthma 03/01/22   Lucky Cowboy, MD  glimepiride (AMARYL) 4 MG tablet TAKE 1/2 TO 1 (ONE-HALF TO ONE) TABLET BY MOUTH ONCE DAILY WITH SUPPER FOR DIABETES 07/23/23   Raynelle Dick, NP  glucose blood test strip Test blood sugars once daily.Dx: E11.9 03/02/17   Doree Albee, PA-C  metFORMIN (GLUCOPHAGE-XR) 500 MG 24 hr tablet Take 1 tablets daily with meal 01/17/23   Raynelle Dick, NP  nitroGLYCERIN (NITROSTAT) 0.4 MG SL tablet Place 1 tablet (0.4 mg total) under the tongue every 5 (five) minutes as needed for chest pain. 03/29/22   Raynelle Dick, NP  pramipexole (MIRAPEX) 1 MG tablet 1-2 tabs at bedtime 07/27/23   Raynelle Dick, NP  ramipril (ALTACE) 5 MG capsule TAKE 1 CAPSULE BY MOUTH ONCE DAILY FOR  BLOOD  PRESSURE  AND  DIABETIC  KIDNEY  PROTECTION 03/29/23   Adela Glimpse, NP  rosuvastatin (CRESTOR) 20 MG tablet TAKE 1 TABLET BY MOUTH THREE TIMES A WEEK FOR CHOLESTEROL 09/05/23   Lucky Cowboy, MD    Family History    Family History  Problem Relation Age of Onset   Cancer Mother         vaginal?    Prostate cancer Father    Hypertension Father    Kidney cancer Maternal Grandmother    Colon cancer Neg Hx    Colon polyps Neg Hx    Esophageal cancer Neg Hx    Rectal cancer Neg Hx    Stomach cancer Neg Hx    He indicated that his mother is alive. He indicated that his father is deceased. He indicated that all of his three sisters are alive. He indicated that his maternal grandmother is deceased. He indicated that his maternal grandfather is deceased. He indicated that his paternal grandmother is deceased. He indicated that his paternal grandfather is deceased. He indicated that the status of his neg hx is unknown.  Social History    Social History   Socioeconomic History   Marital status: Married    Spouse name: Not on file   Number of children: 3   Years of education: Not on file   Highest education level: Not on file  Occupational History   Not on file  Tobacco Use   Smoking status: Every Day    Current packs/day: 0.00    Average packs/day: 0.5 packs/day for 53.0 years (26.5 ttl pk-yrs)    Types: Cigarettes    Start date: 77    Last attempt to quit: 2023    Years since quitting: 2.1    Passive exposure: Current   Smokeless tobacco: Never   Tobacco comments:    Pt smokes  2-3 cigs a day. AB, CMA 09-18-23  Vaping Use   Vaping status: Never Used  Substance and Sexual Activity   Alcohol use: Yes    Alcohol/week: 14.0 standard drinks of alcohol    Types: 14 Cans of beer per week   Drug use: No   Sexual activity: Yes    Partners: Female    Birth control/protection: Post-menopausal  Other Topics Concern   Not on file  Social History Narrative   Not on file   Social Drivers of Health   Financial Resource Strain: Not on file  Food Insecurity: No Food Insecurity (08/12/2021)   Hunger Vital Sign    Worried About Running Out of Food in the Last Year: Never true    Ran Out of Food in the Last Year: Never true  Transportation Needs: No Transportation Needs  (08/12/2021)   PRAPARE - Administrator, Civil Service (Medical): No    Lack of Transportation (Non-Medical): No  Physical Activity: Sufficiently Active (01/01/2018)   Exercise Vital Sign    Days of Exercise per Week: 5 days    Minutes of Exercise per Session: 60 min  Stress: No Stress Concern Present (03/10/2019)   [employer] of Occupational Health - Occupational Stress Questionnaire    Feeling of Stress : Only a little  Social Connections: Not on file  Intimate Partner Violence: Not on file     Review of Systems    General:  No chills, fever, night sweats or weight changes.  Cardiovascular:  No chest pain, dyspnea on exertion, edema, orthopnea, palpitations, paroxysmal nocturnal dyspnea. Dermatological: No rash, lesions/masses Respiratory: No cough, dyspnea Urologic: No hematuria, dysuria Abdominal:   No nausea, vomiting, diarrhea, bright red blood per rectum, melena, or hematemesis Neurologic:  No visual changes, wkns, changes in mental status. All other systems reviewed and are otherwise negative except as noted above.  Physical Exam    VS:  BP (!) 120/58   Pulse 75   Ht 5' 10.5" (1.791 m)   Wt 191 lb (86.6 kg)   SpO2 96%   BMI 27.02 kg/m  , BMI Body mass index is 27.02 kg/m. GEN: Well nourished, well developed, in no acute distress. HEENT: normal. Neck: Supple, no JVD, carotid bruits, or masses. Cardiac: RRR, no murmurs, rubs, or gallops. No clubbing, cyanosis, edema.  Radials/DP/PT 2+ and equal bilaterally.  Respiratory:  Respirations regular and unlabored, clear to auscultation bilaterally. GI: Soft, nontender, nondistended, BS + x 4. MS: no deformity or atrophy. Skin: warm and dry, no rash. Neuro:  Strength and sensation are intact. Psych: Normal affect.  Accessory Clinical Findings    Recent Labs: 07/27/2023: Magnesium 1.8; TSH 1.40 08/30/2023: ALT 17 10/02/2023: BUN 12; Creatinine, Ser 0.72; Hemoglobin 13.6; Platelets 170; Potassium 4.1;  Sodium 136   Recent Lipid Panel    Component Value Date/Time   CHOL 102 07/27/2023 1000   TRIG 79 07/27/2023 1000   HDL 45 07/27/2023 1000   CHOLHDL 2.3 07/27/2023 1000   VLDL 17 03/02/2017 1115   LDLCALC 41 07/27/2023 1000         ECG personally reviewed by me today- EKG Interpretation Date/Time:  Tuesday October 02 2023 15:47:10 EST Ventricular Rate:  73 PR Interval:  118 QRS Duration:  144 QT Interval:  412 QTC Calculation: 453 R Axis:   -28  Text Interpretation: Normal sinus rhythm Right bundle branch block When compared with ECG of 02-Oct-2023 15:45, No significant change was found Confirmed by Edd Fabian [phone])  on 10/02/2023 3:51:14 PM     Nuclear stress test 11/28/2022    The study is normal. The study is low risk.   No ST deviation was noted.   LV perfusion is normal. There is no evidence of ischemia. There is no evidence of infarction.   Left ventricular function is normal. Nuclear stress EF: 57 %. The left ventricular ejection fraction is normal (55-65%). End diastolic cavity size is normal. End systolic cavity size is normal.   Prior study available for comparison from 06/03/2019.   Normal stress nuclear study with mild apical thinning but no ischemia.  Gated ejection fraction 57% with normal wall motion.      Assessment & Plan   1.  Coronary artery disease-no chest pain today.  Denies recent episodes of anginal type symptoms.  Previous low risk stress testing 11/28/2022. Continue aspirin, Plavix, ramipril, rosuvastatin Heart healthy low-sodium diet Maintain physical activity  Hyperlipidemia- 07/27/2023: Cholesterol 102; HDL 45; LDL Cholesterol (Calc) 41; Triglycerides 79 Continue aspirin, rosuvastatin High-fiber diet  Aortic atherosclerosis-denies chest and back discomfort.  Noted on chest abdominal CT.  No aneurysm noted. Continue rosuvastatin, aspirin High-fiber diet  Preoperative cardiac evaluation-laparoscopic cholecystectomy with  indocyanine Surgeon:  Dr. Twana First Surgeon's Group or Practice Name:  [facility] Phone number:  [phone] Fax number:  [phone]     Primary Cardiologist: Charlton Haws, MD  Chart reviewed as part of pre-operative protocol coverage. Given past medical history and time since last visit, based on ACC/AHA guidelines, Joe Green would be at acceptable risk for the planned procedure without further cardiovascular testing.   His RCRI is moderate risk, 6.6% risk of major cardiac event.  He is able to complete greater than 4 METS of physical activity.  His Plavix and aspirin may be held for 5 days prior to his procedure.  Please resume as soon as hemostasis is achieved.  Patient was advised that if he develops new symptoms prior to surgery to contact our office to arrange a follow-up appointment.  He verbalized understanding.   Disposition: Follow-up with Dr. Eden Emms or APP in 9-12 months.   Thomasene Ripple. Sunnie Odden NP-C     10/02/2023, 3:42 PM [facility] [address] Office [phone] Fax [phone]    I spent 14 minutes examining this patient, reviewing medications, and using patient centered shared decision making involving their cardiac care.   I spent  20 minutes reviewing past medical history,  medications, and prior cardiac tests.

## 2023-10-02 NOTE — Telephone Encounter (Signed)
Our office was just informed that the pt's is 10/09/23.  Request came in as TBD, pt has 3/10 with Eden Emms which Alden Server states clearance deferred to MD appt 10/22/23.   I d/w the preop APP today and that I will see if I get the pt on the schedule today in office appt with Edd Fabian, FNP 3:35. I will d/w FNP before scheduling the pt.   Per preop APP: If he can't see Verdon Cummins today I think it will be okay to put him in a provider slot for tele visit on Friday. I will have to send Dr. Eden Emms a priority message asking about holding Plavix, as patient has a long stent.   Edd Fabian, FNP has given ok to add pt to his schedule today 3:35, pt has excepted this appt for today.   I will update all parties involved.

## 2023-10-03 NOTE — Anesthesia Preprocedure Evaluation (Addendum)
 Anesthesia Evaluation  Patient identified by MRN, date of birth, ID band Patient awake    Reviewed: Allergy & Precautions, NPO status , Patient's Chart, lab work & pertinent test results, reviewed documented beta blocker date and time   Airway Mallampati: II  TM Distance: >3 FB     Dental no notable dental hx.    Pulmonary sleep apnea , COPD, Current Smoker and Patient abstained from smoking. ILD   breath sounds clear to auscultation       Cardiovascular hypertension, + CAD and + Cardiac Stents  (-) CABG and (-) CHF  Rhythm:Regular Rate:Normal     Neuro/Psych TIA   GI/Hepatic ,GERD  ,,  Endo/Other  diabetes, Type 2    Renal/GU      Musculoskeletal  (+) Arthritis ,    Abdominal   Peds  Hematology   Anesthesia Other Findings   Reproductive/Obstetrics                             Anesthesia Physical Anesthesia Plan  ASA: 3  Anesthesia Plan: General   Post-op Pain Management:    Induction: Intravenous  PONV Risk Score and Plan: 1 and Ondansetron and Dexamethasone  Airway Management Planned: Oral ETT  Additional Equipment:   Intra-op Plan:   Post-operative Plan: Extubation in OR  Informed Consent: I have reviewed the patients History and Physical, chart, labs and discussed the procedure including the risks, benefits and alternatives for the proposed anesthesia with the patient or authorized representative who has indicated his/her understanding and acceptance.     Dental advisory given  Plan Discussed with: CRNA  Anesthesia Plan Comments: (See PAT note 10/02/2023)       Anesthesia Quick Evaluation

## 2023-10-03 NOTE — Progress Notes (Signed)
Anesthesia Chart Review   Case: [id number] Date/Time: 10/09/23 1200   Procedure: LAPAROSCOPIC CHOLECYSTECTOMY WITH INDOCYANINE GREEN DYE   Anesthesia type: General   Pre-op diagnosis: biliary colic   Location: [facility] ROOM 01 / [facility]   Surgeons: Berna Bue, MD       DISCUSSION:73 y.o. smoker with h/o HTN, OSA, COPD, CAD (s/p stent to RCA in 2005, LHC 2006 after nuke showing ischemia, LHC 9/12 proximal LAD 60%, distal LAD 65%, Dx 60%, proximal circumflex 20%, mid circumflex 50%, OM1 30%, proximal RCA 20%, mid RCA stent patent, EF 65% and medical management was recommended. He had normal NST 12/20), DM II, BPH, biliary colic scheduled for above procedure 10/09/2023 with Dr. Phylliss Blakes.   Pt last seen by cardiology 10/02/2023. Per OV note no chest pain or anginal type symptoms, "Chart reviewed as part of pre-operative protocol coverage. Given past medical history and time since last visit, based on ACC/AHA guidelines, Joe Green would be at acceptable risk for the planned procedure without further cardiovascular testing.    His RCRI is moderate risk, 6.6% risk of major cardiac event.  He is able to complete greater than 4 METS of physical activity.   His Plavix and aspirin may be held for 5 days prior to his procedure.  Please resume as soon as hemostasis is achieved."  Pt reports last dose of Plavix 10/01/2023 PM dose.   VS: BP 119/69   Pulse 70   Temp 36.6 C (Oral)   Resp 20   Ht 5' 10.5" (1.791 m)   Wt 85.6 kg   SpO2 96%   BMI 26.71 kg/m   PROVIDERS: Patient, No Pcp Per  Primary Cardiologist:  Charlton Haws, MD   LABS: Labs reviewed: Acceptable for surgery. (all labs ordered are listed, but only abnormal results are displayed)  Labs Reviewed  HEMOGLOBIN A1C - Abnormal; Notable for the following components:      Result Value   Hgb A1c MFr Bld 6.6 (*)    All other components within normal limits  BASIC METABOLIC PANEL - Abnormal; Notable for the following  components:   Glucose, Bld 136 (*)    All other components within normal limits  GLUCOSE, CAPILLARY - Abnormal; Notable for the following components:   Glucose-Capillary 128 (*)    All other components within normal limits  CBC     IMAGES:   EKG:   CV: Echo 08/16/2023 Left ventricular ejection fraction is 55-60% Left ventricular diastolic function has an impaired relaxation pattern Right ventricular global systolic function is normal  There is trivial mitral regurgitation There is trivial tricuspid valve regurgitation There is no pericardial effusion visualized   Myocardial Perfusion 11/28/2022   The study is normal. The study is low risk.   No ST deviation was noted.   LV perfusion is normal. There is no evidence of ischemia. There is no evidence of infarction.   Left ventricular function is normal. Nuclear stress EF: 57 %. The left ventricular ejection fraction is normal (55-65%). End diastolic cavity size is normal. End systolic cavity size is normal.   Prior study available for comparison from 06/03/2019.   Normal stress nuclear study with mild apical thinning but no ischemia.  Gated ejection fraction 57% with normal wall motion.   Past Medical History:  Diagnosis Date   Aortic atherosclerosis (HCC)by Chest CT scan 11/2016 12/30/2017   Per chest CT 11/2016     Arthritis    B12 deficiency 04/22/2020   BPH (benign  prostatic hyperplasia) 10/05/2014   CAD (coronary artery disease)    a. s/p stent to RCA 2005; b. LHC 2006 after Nuc with inf ischemia: patent RCA stent, D1 80%, LAD 60% => med Rx;  c. LHC 04/18/11: Proximal LAD 60% (unchanged from previous catheterization), distal LAD 65% (small caliber), Dx 60%, proximal CFX 20%, mid CFX 50%, OM1 30%, proximal RCA 20%, mid RCA stent patent, EF 65%.=> med Rx   COPD (chronic obstructive pulmonary disease) (HCC) 10/05/2014   Gallstones 02/21/2021   GERD (gastroesophageal reflux disease)    Hepatic steatosis 02/17/2020   Per CT  02/2020     History of colonic polyps 04/23/2020   HLD (hyperlipidemia)    Hyperlipidemia associated with type 2 diabetes mellitus (HCC) 04/03/2011   Hypertension    ILD (interstitial lung disease) (HCC) 02/21/2021   Suggested by CT 02/2021 - pulmonology referral placed     Mini stroke 08/2023   [facility]   Obesity    OSA (obstructive sleep apnea)    has a cpap-does not use-has a special mouthpiece he wears at night   RLS (restless legs syndrome) 10/05/2014   Type 2 diabetes mellitus (HCC) 06/05/2014   Type II or unspecified type diabetes mellitus without mention of complication, not stated as uncontrolled    Vitamin D deficiency    Wears glasses     Past Surgical History:  Procedure Laterality Date   CARDIAC CATHETERIZATION  2005   stent   CHONDROPLASTY  06/25/2017   Procedure: CHONDROPLASTY;  Surgeon: Jodi Geralds, MD;  Location: [facility];  Service: Orthopedics;;   COLONOSCOPY  11/2011   Due 5 years Dr. Russella Dar   heart stent  2005   RCA   INGUINAL HERNIA REPAIR Right 11/16/2014   Procedure: RIGHT INGUINAL HERNIA REPAIR WITH MESH;  Surgeon: Darnell Level, MD;  Location: [facility];  Service: General;  Laterality: Right;   INSERTION OF MESH Right 11/16/2014   Procedure: INSERTION OF MESH;  Surgeon: Darnell Level, MD;  Location: [facility];  Service: General;  Laterality: Right;   KNEE ARTHROSCOPY WITH MEDIAL MENISECTOMY Bilateral 06/25/2017   Procedure: right knee arthroscopy with medial meniscectomy, chondroplasty, Left knee arthroscopy with medial menisectomy removal of plica and chondroplasty;  Surgeon: Jodi Geralds, MD;  Location: [facility];  Service: Orthopedics;  Laterality: Bilateral;   POLYPECTOMY      MEDICATIONS:  acetaminophen (TYLENOL) 500 MG tablet   albuterol (VENTOLIN HFA) 108 (90 Base) MCG/ACT inhaler   aspirin EC 81 MG tablet   Cinnamon 500 MG TABS   clopidogrel (PLAVIX) 75 MG tablet   doxazosin  (CARDURA) 8 MG tablet   Dulaglutide ([name]) 3 MG/0.5ML SOPN   fexofenadine (ALLEGRA) 60 MG tablet   finasteride (PROSCAR) 5 MG tablet   fluticasone furoate-vilanterol (BREO ELLIPTA) 100-25 MCG/ACT AEPB   glimepiride (AMARYL) 4 MG tablet   glucose blood test strip   metFORMIN (GLUCOPHAGE-XR) 500 MG 24 hr tablet   nitroGLYCERIN (NITROSTAT) 0.4 MG SL tablet   pramipexole (MIRAPEX) 1 MG tablet   ramipril (ALTACE) 5 MG capsule   rosuvastatin (CRESTOR) 20 MG tablet   No current facility-administered medications for this encounter.    regadenoson (LEXISCAN) injection SOLN 0.4 mg   technetium tetrofosmin (TC-MYOVIEW) injection 32.3 millicurie     [facility], PA-C [facility]Surgical [facility] [phone]

## 2023-10-09 ENCOUNTER — Ambulatory Visit (HOSPITAL_COMMUNITY): Payer: Medicare HMO | Admitting: Physician Assistant

## 2023-10-09 ENCOUNTER — Ambulatory Visit (HOSPITAL_BASED_OUTPATIENT_CLINIC_OR_DEPARTMENT_OTHER): Payer: Self-pay | Admitting: Certified Registered"

## 2023-10-09 ENCOUNTER — Encounter (HOSPITAL_COMMUNITY): Payer: Self-pay | Admitting: Surgery

## 2023-10-09 ENCOUNTER — Other Ambulatory Visit: Payer: Self-pay

## 2023-10-09 ENCOUNTER — Encounter (HOSPITAL_COMMUNITY)
Admission: RE | Disposition: A | Discharge status: Home / Self Care | Payer: Self-pay | Source: Home / Self Care | Attending: Surgery

## 2023-10-09 ENCOUNTER — Ambulatory Visit (HOSPITAL_COMMUNITY)
Admission: RE | Admit: 2023-10-09 | Discharge: 2023-10-09 | Disposition: A | Discharge status: Home / Self Care | Payer: Medicare HMO | Attending: Surgery | Admitting: Surgery

## 2023-10-09 DIAGNOSIS — K8060 Calculus of gallbladder and bile duct with cholecystitis, unspecified, without obstruction: Secondary | ICD-10-CM | POA: Diagnosis not present

## 2023-10-09 DIAGNOSIS — Z7984 Long term (current) use of oral hypoglycemic drugs: Secondary | ICD-10-CM | POA: Insufficient documentation

## 2023-10-09 DIAGNOSIS — E1122 Type 2 diabetes mellitus with diabetic chronic kidney disease: Secondary | ICD-10-CM | POA: Diagnosis not present

## 2023-10-09 DIAGNOSIS — Z955 Presence of coronary angioplasty implant and graft: Secondary | ICD-10-CM | POA: Insufficient documentation

## 2023-10-09 DIAGNOSIS — M1990 Unspecified osteoarthritis, unspecified site: Secondary | ICD-10-CM | POA: Insufficient documentation

## 2023-10-09 DIAGNOSIS — K8010 Calculus of gallbladder with chronic cholecystitis without obstruction: Secondary | ICD-10-CM | POA: Insufficient documentation

## 2023-10-09 DIAGNOSIS — E119 Type 2 diabetes mellitus without complications: Secondary | ICD-10-CM

## 2023-10-09 DIAGNOSIS — I700 Atherosclerosis of aorta: Secondary | ICD-10-CM | POA: Insufficient documentation

## 2023-10-09 DIAGNOSIS — Z8673 Personal history of transient ischemic attack (TIA), and cerebral infarction without residual deficits: Secondary | ICD-10-CM | POA: Insufficient documentation

## 2023-10-09 DIAGNOSIS — G4733 Obstructive sleep apnea (adult) (pediatric): Secondary | ICD-10-CM | POA: Diagnosis not present

## 2023-10-09 DIAGNOSIS — I2510 Atherosclerotic heart disease of native coronary artery without angina pectoris: Secondary | ICD-10-CM

## 2023-10-09 DIAGNOSIS — I129 Hypertensive chronic kidney disease with stage 1 through stage 4 chronic kidney disease, or unspecified chronic kidney disease: Secondary | ICD-10-CM | POA: Insufficient documentation

## 2023-10-09 DIAGNOSIS — J449 Chronic obstructive pulmonary disease, unspecified: Secondary | ICD-10-CM | POA: Insufficient documentation

## 2023-10-09 DIAGNOSIS — N182 Chronic kidney disease, stage 2 (mild): Secondary | ICD-10-CM | POA: Diagnosis not present

## 2023-10-09 DIAGNOSIS — K219 Gastro-esophageal reflux disease without esophagitis: Secondary | ICD-10-CM | POA: Diagnosis not present

## 2023-10-09 DIAGNOSIS — K819 Cholecystitis, unspecified: Secondary | ICD-10-CM | POA: Diagnosis present

## 2023-10-09 DIAGNOSIS — I10 Essential (primary) hypertension: Secondary | ICD-10-CM

## 2023-10-09 DIAGNOSIS — Z87891 Personal history of nicotine dependence: Secondary | ICD-10-CM | POA: Insufficient documentation

## 2023-10-09 DIAGNOSIS — N400 Enlarged prostate without lower urinary tract symptoms: Secondary | ICD-10-CM | POA: Diagnosis not present

## 2023-10-09 DIAGNOSIS — J849 Interstitial pulmonary disease, unspecified: Secondary | ICD-10-CM | POA: Diagnosis not present

## 2023-10-09 DIAGNOSIS — F17210 Nicotine dependence, cigarettes, uncomplicated: Secondary | ICD-10-CM

## 2023-10-09 HISTORY — PX: CHOLECYSTECTOMY: SHX55

## 2023-10-09 LAB — GLUCOSE, CAPILLARY
Glucose-Capillary: 133 mg/dL — ABNORMAL HIGH (ref 70–99)
Glucose-Capillary: 161 mg/dL — ABNORMAL HIGH (ref 70–99)

## 2023-10-09 SURGERY — LAPAROSCOPIC CHOLECYSTECTOMY WITH INTRAOPERATIVE CHOLANGIOGRAM
Anesthesia: General

## 2023-10-09 MED ORDER — OXYCODONE HCL 5 MG PO TABS: 5.0000 mg | ORAL_TABLET | Freq: Three times a day (TID) | ORAL | 0 refills | Status: AC | PRN

## 2023-10-09 MED ORDER — 0.9 % SODIUM CHLORIDE (POUR BTL) OPTIME
TOPICAL | Status: DC | PRN
  Administered 2023-10-09: 1000 mL

## 2023-10-09 MED ORDER — BUPIVACAINE-EPINEPHRINE (PF) 0.25% -1:200000 IJ SOLN
INTRAMUSCULAR | Status: AC
  Filled 2023-10-09: qty 30

## 2023-10-09 MED ORDER — LIDOCAINE HCL (PF) 2 % IJ SOLN
INTRAMUSCULAR | Status: DC | PRN
  Administered 2023-10-09: 100 mg via INTRADERMAL

## 2023-10-09 MED ORDER — CHLORHEXIDINE GLUCONATE 4 % EX SOLN: 60.0000 mL | Freq: Once | CUTANEOUS | Status: DC

## 2023-10-09 MED ORDER — ONDANSETRON HCL 4 MG/2ML IJ SOLN: 4.0000 mg | Freq: Once | INTRAMUSCULAR | Status: DC | PRN

## 2023-10-09 MED ORDER — INSULIN ASPART 100 UNIT/ML IJ SOLN: 0.0000 [IU] | INTRAMUSCULAR | Status: DC | PRN

## 2023-10-09 MED ORDER — INDOCYANINE GREEN 25 MG IV SOLR
1.2500 mg | Freq: Once | INTRAVENOUS | Status: AC
  Administered 2023-10-09: 1.25 mg via INTRAVENOUS
  Filled 2023-10-09: qty 10

## 2023-10-09 MED ORDER — ONDANSETRON HCL 4 MG/2ML IJ SOLN
INTRAMUSCULAR | Status: DC | PRN
  Administered 2023-10-09: 4 mg via INTRAVENOUS

## 2023-10-09 MED ORDER — DOCUSATE SODIUM 100 MG PO CAPS: 100.0000 mg | ORAL_CAPSULE | Freq: Two times a day (BID) | ORAL | 0 refills | Status: AC

## 2023-10-09 MED ORDER — DEXAMETHASONE SODIUM PHOSPHATE 10 MG/ML IJ SOLN
INTRAMUSCULAR | Status: AC
  Filled 2023-10-09: qty 1

## 2023-10-09 MED ORDER — GABAPENTIN 300 MG PO CAPS
300.0000 mg | ORAL_CAPSULE | ORAL | Status: AC
  Administered 2023-10-09: 300 mg via ORAL
  Filled 2023-10-09: qty 1

## 2023-10-09 MED ORDER — BUPIVACAINE LIPOSOME 1.3 % IJ SUSP
INTRAMUSCULAR | Status: AC
  Filled 2023-10-09: qty 20

## 2023-10-09 MED ORDER — LIDOCAINE HCL (PF) 2 % IJ SOLN
INTRAMUSCULAR | Status: AC
  Filled 2023-10-09: qty 5

## 2023-10-09 MED ORDER — ROCURONIUM BROMIDE 10 MG/ML (PF) SYRINGE
PREFILLED_SYRINGE | INTRAVENOUS | Status: AC
  Filled 2023-10-09: qty 10

## 2023-10-09 MED ORDER — DEXAMETHASONE SODIUM PHOSPHATE 10 MG/ML IJ SOLN
INTRAMUSCULAR | Status: DC | PRN
  Administered 2023-10-09: 4 mg via INTRAVENOUS

## 2023-10-09 MED ORDER — OXYCODONE HCL 5 MG/5ML PO SOLN: 5.0000 mg | Freq: Once | ORAL | Status: DC | PRN

## 2023-10-09 MED ORDER — FENTANYL CITRATE (PF) 100 MCG/2ML IJ SOLN
INTRAMUSCULAR | Status: DC | PRN
  Administered 2023-10-09 (×2): 50 ug via INTRAVENOUS
  Administered 2023-10-09: 100 ug via INTRAVENOUS

## 2023-10-09 MED ORDER — LACTATED RINGERS IV SOLN: INTRAVENOUS | Status: DC

## 2023-10-09 MED ORDER — CEFAZOLIN SODIUM-DEXTROSE 2-4 GM/100ML-% IV SOLN
2.0000 g | INTRAVENOUS | Status: AC
Start: 2023-10-09 — End: 2023-10-09
  Administered 2023-10-09: 2 g via INTRAVENOUS
  Filled 2023-10-09: qty 100

## 2023-10-09 MED ORDER — BUPIVACAINE-EPINEPHRINE 0.25% -1:200000 IJ SOLN
INTRAMUSCULAR | Status: DC | PRN
  Administered 2023-10-09: 30 mL

## 2023-10-09 MED ORDER — ACETAMINOPHEN 10 MG/ML IV SOLN: 1000.0000 mg | Freq: Once | INTRAVENOUS | Status: DC | PRN

## 2023-10-09 MED ORDER — ONDANSETRON HCL 4 MG/2ML IJ SOLN
INTRAMUSCULAR | Status: AC
  Filled 2023-10-09: qty 2

## 2023-10-09 MED ORDER — ROCURONIUM BROMIDE 10 MG/ML (PF) SYRINGE
PREFILLED_SYRINGE | INTRAVENOUS | Status: DC | PRN
  Administered 2023-10-09: 70 mg via INTRAVENOUS

## 2023-10-09 MED ORDER — BUPIVACAINE LIPOSOME 1.3 % IJ SUSP: 20.0000 mL | Freq: Once | INTRAMUSCULAR | Status: DC

## 2023-10-09 MED ORDER — OXYCODONE HCL 5 MG PO TABS: 5.0000 mg | ORAL_TABLET | Freq: Once | ORAL | Status: DC | PRN

## 2023-10-09 MED ORDER — CHLORHEXIDINE GLUCONATE 4 % EX SOLN
60.0000 mL | Freq: Once | CUTANEOUS | Status: DC
Start: 2023-10-10 — End: 2023-10-09

## 2023-10-09 MED ORDER — FENTANYL CITRATE (PF) 100 MCG/2ML IJ SOLN
INTRAMUSCULAR | Status: AC
  Filled 2023-10-09: qty 2

## 2023-10-09 MED ORDER — FENTANYL CITRATE PF 50 MCG/ML IJ SOSY: 25.0000 ug | PREFILLED_SYRINGE | INTRAMUSCULAR | Status: DC | PRN

## 2023-10-09 MED ORDER — CHLORHEXIDINE GLUCONATE 0.12 % MT SOLN
15.0000 mL | Freq: Once | OROMUCOSAL | Status: AC
  Administered 2023-10-09: 15 mL via OROMUCOSAL

## 2023-10-09 MED ORDER — LACTATED RINGERS IR SOLN
Status: DC | PRN
  Administered 2023-10-09: 1000 mL

## 2023-10-09 MED ORDER — ORAL CARE MOUTH RINSE: 15.0000 mL | Freq: Once | OROMUCOSAL | Status: AC

## 2023-10-09 MED ORDER — PROPOFOL 10 MG/ML IV BOLUS
INTRAVENOUS | Status: AC
  Filled 2023-10-09: qty 20

## 2023-10-09 MED ORDER — PROPOFOL 10 MG/ML IV BOLUS
INTRAVENOUS | Status: DC | PRN
  Administered 2023-10-09: 150 mg via INTRAVENOUS

## 2023-10-09 MED ORDER — ACETAMINOPHEN 500 MG PO TABS
1000.0000 mg | ORAL_TABLET | ORAL | Status: AC
  Administered 2023-10-09: 1000 mg via ORAL
  Filled 2023-10-09: qty 2

## 2023-10-09 MED ORDER — SUGAMMADEX SODIUM 200 MG/2ML IV SOLN
INTRAVENOUS | Status: DC | PRN
  Administered 2023-10-09: 200 mg via INTRAVENOUS

## 2023-10-09 SURGICAL SUPPLY — 39 items
APPLIER CLIP ROT 10 11.4 M/L (STAPLE) ×1 IMPLANT
BAG COUNTER SPONGE SURGICOUNT (BAG) ×2 IMPLANT
BENZOIN TINCTURE PRP APPL 2/3 (GAUZE/BANDAGES/DRESSINGS) IMPLANT
BNDG ADH 1X3 SHEER STRL LF (GAUZE/BANDAGES/DRESSINGS) IMPLANT
CABLE HIGH FREQUENCY MONO STRZ (ELECTRODE) ×2 IMPLANT
CHLORAPREP W/TINT 26 (MISCELLANEOUS) ×2 IMPLANT
CLIP APPLIE ROT 10 11.4 M/L (STAPLE) ×2 IMPLANT
COVER MAYO STAND XLG (MISCELLANEOUS) IMPLANT
COVER SURGICAL LIGHT HANDLE (MISCELLANEOUS) ×2 IMPLANT
DERMABOND ADVANCED .7 DNX12 (GAUZE/BANDAGES/DRESSINGS) IMPLANT
DRAPE C-ARM 42X120 X-RAY (DRAPES) IMPLANT
ELECT REM PT RETURN 15FT ADLT (MISCELLANEOUS) ×2 IMPLANT
ENDOLOOP SUT PDS II 0 18 (SUTURE) IMPLANT
GLOVE BIO SURGEON STRL SZ 6 (GLOVE) ×2 IMPLANT
GLOVE INDICATOR 6.5 STRL GRN (GLOVE) ×2 IMPLANT
GOWN STRL REUS W/ TWL LRG LVL3 (GOWN DISPOSABLE) ×2 IMPLANT
GRASPER SUT TROCAR 14GX15 (MISCELLANEOUS) ×2 IMPLANT
HEMOSTAT SNOW SURGICEL 2X4 (HEMOSTASIS) IMPLANT
IRRIG SUCT STRYKERFLOW 2 WTIP (MISCELLANEOUS) ×1 IMPLANT
IRRIGATION SUCT STRKRFLW 2 WTP (MISCELLANEOUS) ×2 IMPLANT
KIT BASIN OR (CUSTOM PROCEDURE TRAY) ×2 IMPLANT
KIT TURNOVER KIT A (KITS) IMPLANT
NDL INSUFFLATION 14GA 120MM (NEEDLE) ×2 IMPLANT
NEEDLE INSUFFLATION 14GA 120MM (NEEDLE) ×1 IMPLANT
POUCH RETRIEVAL ECOSAC 10 (ENDOMECHANICALS) IMPLANT
SCISSORS LAP 5X35 DISP (ENDOMECHANICALS) ×2 IMPLANT
SET CHOLANGIOGRAPH MIX (MISCELLANEOUS) IMPLANT
SET TUBE SMOKE EVAC HIGH FLOW (TUBING) ×2 IMPLANT
SLEEVE Z-THREAD 5X100MM (TROCAR) ×2 IMPLANT
SPIKE FLUID TRANSFER (MISCELLANEOUS) ×2 IMPLANT
STRIP CLOSURE SKIN 1/2X4 (GAUZE/BANDAGES/DRESSINGS) IMPLANT
SUT MNCRL AB 4-0 PS2 18 (SUTURE) ×2 IMPLANT
SYS BAG RETRIEVAL 10MM (BASKET) ×1 IMPLANT
SYSTEM BAG RETRIEVAL 10MM (BASKET) IMPLANT
TOWEL OR 17X26 10 PK STRL BLUE (TOWEL DISPOSABLE) ×2 IMPLANT
TRAY LAPAROSCOPIC (CUSTOM PROCEDURE TRAY) ×2 IMPLANT
TROCAR ADV FIXATION 12X100MM (TROCAR) ×2 IMPLANT
TROCAR XCEL NON-BLD 5MMX100MML (ENDOMECHANICALS) IMPLANT
TROCAR Z-THREAD OPTICAL 5X100M (TROCAR) ×2 IMPLANT

## 2023-10-09 NOTE — Transfer of Care (Signed)
 Immediate Anesthesia Transfer of Care Note  Patient: Joe Green  Procedure(s) Performed: LAPAROSCOPIC CHOLECYSTECTOMY WITH INDOCYANINE GREEN DYE  Patient Location: PACU  Anesthesia Type:General  Level of Consciousness: awake, alert , and patient cooperative  Airway & Oxygen Therapy: Patient Spontanous Breathing and Patient connected to face mask oxygen  Post-op Assessment: Report given to RN and Post -op Vital signs reviewed and stable  Post vital signs: Reviewed and stable  Last Vitals:  Vitals Value Taken Time  BP 144/75   Temp    Pulse 69 10/09/23 1403  Resp 11 10/09/23 1403  SpO2 95 % 10/09/23 1403  Vitals shown include unfiled device data.  Last Pain:  Vitals:   10/09/23 1038  TempSrc: Oral  PainSc:          Complications: No notable events documented.

## 2023-10-09 NOTE — H&P (Signed)
 BRIGHT KILLE [id number]   Referring Provider:  Particia Lather, MD Cardiologist: Dr. Eden Emms  Subjective   Chief Complaint: No chief complaint on file.     History of Present Illness:    73 year old male with history of TIA couple weeks ago at [facility] for which he was started on Plavix and aspirin, Aortic atherosclerosis, BPH, coronary artery disease with history of PCI 20 years ago, COPD, GERD, hepatic steatosis, hyperlipidemia, type 2 diabetes, hypertension, CKD 2, interstitial lung disease, obstructive sleep apnea, restless leg syndrome, tobacco abuse, and previous right inguinal hernia repair by Dr. Gerrit Friends in 2016 who presents for evaluation of gallstones.  He reported in mid December at his PCP and aching right flank pain for about a week.  Ultrasound was done demonstrating gallstones but no other concerning findings.  He was evaluated by gastroenterology earlier this month and at that time noted right upper quadrant abdominal pain radiating to the back which had increased in severity from this 30 to about a 7.  Pain is intermittent and occurs with or without eating.  Is not improved by position or bowel movement.  Does note intermittent indigestion, but no nausea or vomiting.  He noted at that visit that he was eating less because he felt full quickly.  Lab work 07/27/2023; CMP is unremarkable, lipid panel unremarkable, vitamin D normal, CBC unremarkable, hemoglobin A1c 7.4, TSH, PSA normal  [place] 08/03/23: Gallstones without sonographic evidence of cholecystitis; common bile duct 3 mm,  Echo 08/16/2023: EF 55 to 60%, normal right ventricular function  Review of Systems: A complete review of systems was obtained from the patient.  I have reviewed this information and discussed as appropriate with the patient.  See HPI as well for other ROS.   Medical History: Past Medical History:  Diagnosis Date   Diabetes mellitus without complication (CMS-HCC)    Sleep apnea     There  is no problem list on file for this patient.   Past Surgical History:  Procedure Laterality Date   HERNIA REPAIR       No Known Allergies  Current Outpatient Medications on File Prior to Visit  Medication Sig Dispense Refill   aspirin 325 MG EC tablet Take 325 mg by mouth once daily.     doxazosin (CARDURA) 8 MG tablet TAKE 1 TABLET BY MOUTH AT BEDTIME FOR BLOOD PRESSURE AND FOR PROSTATE     glimepiride (AMARYL) 4 MG tablet TAKE 1/2 TO 1 (ONE-HALF TO ONE) TABLET BY MOUTH TWICE DAILY WITH MEALS FOR DIABETES     metFORMIN (GLUCOPHAGE) 1000 MG tablet TAKE 1 TABLET BY MOUTH TWICE DAILY WITH MEALS FOR DIABETES     METFORMIN HCL (METFORMIN ORAL) Take by mouth.     ramipriL (ALTACE) 5 MG capsule TAKE 1 CAPSULE BY MOUTH ONCE DAILY FOR BLOOD PRESSURE AND DIABETIC KIDNEY PROTECTION     RAMIPRIL ORAL Take by mouth.     rOPINIRole (REQUIP) 3 MG tablet TAKE 1 TABLET BY MOUTH THREE TIMES DAILY FOR RESTLESS LEGS     rosuvastatin (CRESTOR) 20 MG tablet TAKE 1 TABLET BY MOUTH THREE NIGHTS A WEEK FOR CHOLESTEROL     ROSUVASTATIN CALCIUM (CRESTOR ORAL) Take by mouth.     SAXAGLIPTIN HCL (ONGLYZA ORAL) Take by mouth.     No current facility-administered medications on file prior to visit.    No family history on file.   Social History   Tobacco Use  Smoking Status Former   Current packs/day: 0.00  Types: Cigarettes   Quit date: 2022   Years since quitting: 3.0  Smokeless Tobacco Never     Social History   Socioeconomic History   Marital status: Married  Tobacco Use   Smoking status: Former    Current packs/day: 0.00    Types: Cigarettes    Quit date: 2022    Years since quitting: 3.0   Smokeless tobacco: Never  Vaping Use   Vaping status: Never Used  Substance and Sexual Activity   Alcohol use: Not Currently   Drug use: Defer   Sexual activity: Defer   Social Drivers of Health   Food Insecurity: No Food Insecurity (08/12/2021)   Received from [facility]   Hunger Vital Sign     Worried About Running Out of Food in the Last Year: Never true    Ran Out of Food in the Last Year: Never true  Transportation Needs: No Transportation Needs (08/12/2021)   Received from [facility] - Transportation    Lack of Transportation (Medical): No    Lack of Transportation (Non-Medical): No  Physical Activity: Sufficiently Active (01/01/2018)   Received from [facility]   Exercise Vital Sign    Days of Exercise per Week: 5 days    Minutes of Exercise per Session: 60 min  Stress: No Stress Concern Present (03/10/2019)   Received from [facility] - Occupational Stress Questionnaire    Feeling of Stress : Only a little    Objective:    There were no vitals filed for this visit.  There is no height or weight on file to calculate BMI.  Gen: A&Ox3, no distress  Unlabored respirations Abd soft, mildly tender along the right upper quadrant without peritoneal signs, no mass or organomegaly  Assessment and Plan:  Diagnoses and all orders for this visit:  Biliary colic    I recommend proceeding with laparoscopic or robotic cholecystectomy with possible cholangiogram.  Discussed the relevant anatomy using a diagram to demonstrate, and went over surgical technique.  Discussed risks of surgery including bleeding, infection, pain, scarring, intraabdominal injury specifically to the common bile duct and sequelae, subtotal cholecystectomy, bile leak, conversion to open surgery, failure to resolve symptoms, blood clots/ pulmonary embolus, heart attack, pneumonia, stroke, death. Questions welcomed and answered to patient's satisfaction.  Patient wishes to proceed with surgery. Will request cardiac clearance especially in light of his recent TIA.Marland Kitchen   Kamden Reber Carlye Grippe, MD

## 2023-10-09 NOTE — Anesthesia Procedure Notes (Addendum)
 Procedure Name: Intubation Date/Time: 10/09/2023 12:29 PM  Performed by: Sindy Guadeloupe, CRNAPre-anesthesia Checklist: Patient identified, Emergency Drugs available, Suction available and Patient being monitored Patient Re-evaluated:Patient Re-evaluated prior to induction Oxygen Delivery Method: Circle system utilized Preoxygenation: Pre-oxygenation with 100% oxygen Induction Type: IV induction Ventilation: Oral airway inserted - appropriate to patient size and Two handed mask ventilation required Laryngoscope Size: Mac and 4 Grade View: Grade III Tube type: Oral Tube size: 7.5 mm Number of attempts: 1 Airway Equipment and Method: Stylet and Oral airway Placement Confirmation: ETT inserted through vocal cords under direct vision, positive ETCO2 and breath sounds checked- equal and bilateral Secured at: 24 cm Tube secured with: Tape Dental Injury: Teeth and Oropharynx as per pre-operative assessment  Comments: 2 hand mask required for Joe Green mask seal with beard

## 2023-10-09 NOTE — Op Note (Signed)
 Operative Note  Joe Green 73 y.o. male [id number]  10/09/2023  Surgeon: Berna Bue MD FACS  Procedure performed: Laparoscopic Cholecystectomy with near infrared fluorescent cholangiography  Preop diagnosis: biliary colic Post-op diagnosis/intraop findings: same, chronic cholecystitis  Specimens: gallbladder  Retained items: none  EBL: minimal  Complications: none  Description of procedure: After confirming informed consent the patient was brought to the operating room. Antibiotics were administered. SCD's were applied. General endotracheal anesthesia was initiated and a formal time-out was performed. The abdomen was prepped and draped in the usual sterile fashion and the abdomen was entered using an infraumbilical veress needle after instilling the site with local. Insufflation to was obtained, 5mm trocar and camera inserted, and gross inspection revealed no evidence of injury from our entry or other intraabdominal abnormalities. Two 5mm trocars were introduced in the right midclavicular and right anterior axillary lines under direct visualization and following infiltration with local. A 12mm trocar was placed in the epigastrium.  The gallbladder was distended with omental and duodenal adhesions.  The gallbladder fundus was retracted cephalad and adhesions carefully lysed with combination of cautery and blunt dissection, using only blunt dissection near the duodenum and carefully protecting this until it was fully swept away.  At this point the gallbladder infundibulum was retracted laterally. A combination of hook electrocautery and blunt dissection was utilized to clear the peritoneum from the neck and cystic duct, circumferentially isolating the cystic artery and cystic duct and lifting the gallbladder from the cystic plate. The critical view of safety was achieved with the cystic artery, cystic duct, and liver bed visualized between them with no other structures.  At this  point near infrared fluorescent cholangiography was activated and demonstrated concordant anatomy.  The gallbladder and cystic duct were clearly visualized, the common duct was faintly visible and well away from the area of dissection.  The artery was diminutive and was divided with cautery.  The cystic duct was somewhat patulous, 3 clips were placed across the proximal cystic duct and 2 distally and this was divided sharply.  The clips were not quite elevated across.  Two 0 PDS Endoloops were placed across the remnant cystic duct, just proximal to the clips and secured well.  The gallbladder was dissected from the liver plate using electrocautery. Once freed the gallbladder was placed in an endocatch bag and removed intact through the epigastric trocar site. A minimal amount of oozing on the liver bed was controlled with cautery. Hemostasis was once again confirmed, and reinspection of the abdomen revealed no injuries. The clips/endoloops were well apposed without any bile leak from the ligated remnant cystic duct or the liver bed, either on direct view nor with near infrared fluorescent cholangiography. The 12mm trocar site in the epigastrium was closed with a 0 vicryl in the fascia under direct visualization using a PMI device. The abdomen was desufflated and all trocars removed. The skin incisions were closed with subcuticular 4-0 monocryl and Dermabond. The patient was awakened, extubated and transported to the recovery room in stable condition.    All counts were correct at the completion of the case.

## 2023-10-09 NOTE — Discharge Instructions (Signed)
 LAPAROSCOPIC SURGERY: POST OP INSTRUCTIONS   EAT Gradually transition to a high fiber diet with a fiber supplement over the next few weeks after discharge.  Start with a pureed / full liquid diet (see below)  WALK Walk an hour a day (cumulative- not all at once).  Control your pain to do that.    CONTROL PAIN Control pain so that you can walk, sleep, tolerate sneezing/coughing, go up/down stairs.  HAVE A BOWEL MOVEMENT DAILY Keep your bowels regular to avoid problems.  OK to try a laxative to override constipation.  OK to use an antidiarrheal to slow down diarrhea.  Call if not better after 2 tries  CALL IF YOU HAVE PROBLEMS/CONCERNS Call if you are still struggling despite following these instructions. Call if you have concerns not answered by these instructions    DIET: Follow a light bland diet & liquids the first 24 hours after arrival home, such as soup, liquids, starches, etc.  Be sure to drink plenty of fluids.  Quickly advance to a usual solid diet within a few days.  Avoid fast food or heavy meals initially as you are more likely to get nauseated or have irregular bowels.  A low-sugar, high-fiber diet for the rest of your life is ideal.  Take your usually prescribed home medications unless otherwise directed.  PAIN CONTROL: Pain is best controlled by a usual combination of three different methods TOGETHER: Ice/Heat Over the counter pain medication Prescription pain medication Most patients will experience some swelling and bruising around the incisions.  Ice packs or heating pads (30-60 minutes up to 6 times a day) will help. Use ice for the first few days to help decrease swelling and bruising, then switch to heat to help relax tight/sore spots and speed recovery.  Some people prefer to use ice alone, heat alone, alternating between ice & heat.  Experiment to what works for you.  Swelling and bruising can take several weeks to resolve.   It is helpful to take an  over-the-counter pain medication regularly for the first few days: Naproxen (Aleve, etc)  Two 220mg  tabs twice a day OR Ibuprofen (Advil, etc) Three 200mg  tabs four times a day (every meal & bedtime) AND Acetaminophen (Tylenol, etc) 500-650mg  four times a day (every meal & bedtime) A  prescription for pain medication (such as oxycodone, hydrocodone, tramadol, gabapentin, methocarbamol, etc) should be given to you upon discharge.  Take your pain medication as prescribed, IF NEEDED.  If you are having problems/concerns with the prescription medicine (does not control pain, nausea, vomiting, rash, itching, etc), please call us [phone] to see if we need to switch you to a different pain medicine that will work better for you and/or control your side effect better. If you need a refill on your pain medication, please give [place] 48 hour notice.  contact your pharmacy.  They will contact our office to request authorization. Prescriptions will not be filled after 5 pm or on week-ends  Avoid getting constipated.   Between the surgery and the pain medications, it is common to experience some constipation.   Increasing fluid intake and taking a fiber supplement (such as Metamucil, Citrucel, FiberCon, MiraLax, etc) 1-2 times a day regularly will usually help prevent this problem from occurring.   A mild laxative (prune juice, Milk of Magnesia, MiraLax, etc) should be taken according to package directions if there are no bowel movements after 48 hours.   Watch out for diarrhea.   If you have many  loose bowel movements, simplify your diet to bland foods & liquids for a few days.   Stop any stool softeners and decrease your fiber supplement.   Switching to mild anti-diarrheal medications (Kayopectate, Pepto Bismol) can help.   If this worsens or does not improve, please call us.  Wash / shower every day.  You may shower over the skin glue which is waterproof.  Do not soak or submerge incisions.  No rubbing,  scrubbing, lotions or ointments to incisions.  Glue will flake off after about 2 weeks.  You may leave the incision open to air.  You may replace a dressing/Band-Aid to cover the incision for comfort if you wish.   ACTIVITIES as tolerated:   You may resume regular (light) daily activities beginning the next day--such as daily self-care, walking, climbing stairs--gradually increasing activities as tolerated.  If you can walk 30 minutes without difficulty, it is safe to try more intense activity such as jogging, treadmill, bicycling, low-impact aerobics, swimming, etc. Save the most intensive and strenuous activity for last such as sit-ups, heavy lifting, contact sports, etc  Refrain from any heavy lifting or straining until you are off narcotics for pain control.   DO NOT PUSH THROUGH PAIN.  Let pain be your guide: If it hurts to do something, don't do it.  Pain is your body warning you to avoid that activity for another week until the pain goes down. You may drive when you are no longer taking prescription pain medication, you can comfortably wear a seatbelt, and you can safely maneuver your car and apply brakes. You may have sexual intercourse when it is comfortable.  FOLLOW UP in our office Please call [facility] at [phone] to set up an appointment to see your surgeon in the office for a follow-up appointment approximately 2-3 weeks after your surgery. Make sure that you call for this appointment the day you arrive home to insure a convenient appointment time.  10. IF YOU HAVE DISABILITY OR FAMILY LEAVE FORMS, BRING THEM TO THE OFFICE FOR PROCESSING.  DO NOT GIVE THEM TO YOUR DOCTOR.   WHEN TO CALL us [phone]: Poor pain control Reactions / problems with new medications (rash/itching, nausea, etc)  Fever over 101.5 F (38.5 C) Inability to urinate Nausea and/or vomiting Worsening swelling or bruising Continued bleeding from incision. Increased pain, redness, or drainage from the  incision   The clinic staff is available to answer your questions during regular business hours (8:30am-5pm).  Please don't hesitate to call and ask to speak to one of our nurses for clinical concerns.   If you have a medical emergency, go to the nearest emergency room or call 911.  A surgeon from [facility] is always on call at the [facility], [place] [address], [place], [place]  [address] ? MAIN: [phone] ? TOLL FREE: [phone] ?  FAX [phone] [email or web address]

## 2023-10-09 NOTE — Anesthesia Procedure Notes (Signed)
 Procedure Name: MAC Date/Time: 10/09/2023 1:33 PM  Performed by: Sindy Guadeloupe, CRNAPre-anesthesia Checklist: Patient identified, Emergency Drugs available, Suction available, Patient being monitored and Timeout performed Oxygen Delivery Method: Simple face mask

## 2023-10-09 NOTE — Anesthesia Postprocedure Evaluation (Signed)
 Anesthesia Post Note  Patient: Joe Green  Procedure(s) Performed: LAPAROSCOPIC CHOLECYSTECTOMY WITH INDOCYANINE GREEN DYE     Patient location during evaluation: PACU Anesthesia Type: General Level of consciousness: awake and alert Pain management: pain level controlled Vital Signs Assessment: post-procedure vital signs reviewed and stable Respiratory status: spontaneous breathing, nonlabored ventilation, respiratory function stable and patient connected to nasal cannula oxygen Cardiovascular status: blood pressure returned to baseline and stable Postop Assessment: no apparent nausea or vomiting Anesthetic complications: no   No notable events documented.  Last Vitals:  Vitals:   10/09/23 1430 10/09/23 1438  BP: 126/71 119/60  Pulse: 70 84  Resp: 16 20  Temp:  36.4 C  SpO2: 95% 96%    Last Pain:  Vitals:   10/09/23 1438  TempSrc: Oral  PainSc: 0-No pain                 Mariann Barter

## 2023-10-10 ENCOUNTER — Encounter (HOSPITAL_COMMUNITY): Payer: Self-pay | Admitting: Surgery

## 2023-10-10 ENCOUNTER — Other Ambulatory Visit: Payer: Self-pay

## 2023-10-10 LAB — SURGICAL PATHOLOGY

## 2023-10-10 MED ORDER — RAMIPRIL 5 MG PO CAPS: ORAL_CAPSULE | ORAL | 0 refills | Status: DC

## 2023-10-12 ENCOUNTER — Encounter (HOSPITAL_BASED_OUTPATIENT_CLINIC_OR_DEPARTMENT_OTHER): Payer: Self-pay

## 2023-10-12 ENCOUNTER — Ambulatory Visit (HOSPITAL_BASED_OUTPATIENT_CLINIC_OR_DEPARTMENT_OTHER): Payer: Medicare HMO | Admitting: Student

## 2023-10-12 ENCOUNTER — Encounter (HOSPITAL_BASED_OUTPATIENT_CLINIC_OR_DEPARTMENT_OTHER): Payer: Self-pay | Admitting: Student

## 2023-10-12 VITALS — BP 130/71 | HR 127 | Temp 99.5°F | Ht 68.5 in | Wt 184.7 lb

## 2023-10-12 DIAGNOSIS — J101 Influenza due to other identified influenza virus with other respiratory manifestations: Secondary | ICD-10-CM

## 2023-10-12 DIAGNOSIS — R6889 Other general symptoms and signs: Secondary | ICD-10-CM | POA: Diagnosis not present

## 2023-10-12 DIAGNOSIS — Z7689 Persons encountering health services in other specified circumstances: Secondary | ICD-10-CM

## 2023-10-12 DIAGNOSIS — G459 Transient cerebral ischemic attack, unspecified: Secondary | ICD-10-CM

## 2023-10-12 DIAGNOSIS — Z7984 Long term (current) use of oral hypoglycemic drugs: Secondary | ICD-10-CM

## 2023-10-12 DIAGNOSIS — E1169 Type 2 diabetes mellitus with other specified complication: Secondary | ICD-10-CM

## 2023-10-12 DIAGNOSIS — J449 Chronic obstructive pulmonary disease, unspecified: Secondary | ICD-10-CM

## 2023-10-12 DIAGNOSIS — E1122 Type 2 diabetes mellitus with diabetic chronic kidney disease: Secondary | ICD-10-CM

## 2023-10-12 DIAGNOSIS — E785 Hyperlipidemia, unspecified: Secondary | ICD-10-CM

## 2023-10-12 DIAGNOSIS — N182 Chronic kidney disease, stage 2 (mild): Secondary | ICD-10-CM

## 2023-10-12 DIAGNOSIS — J849 Interstitial pulmonary disease, unspecified: Secondary | ICD-10-CM

## 2023-10-12 LAB — POCT INFLUENZA A/B
Influenza A, POC: POSITIVE — AB
Influenza B, POC: NEGATIVE

## 2023-10-12 LAB — POC COVID19 BINAXNOW: SARS Coronavirus 2 Ag: NEGATIVE

## 2023-10-12 MED ORDER — PROMETHAZINE-DM 6.25-15 MG/5ML PO SYRP: 5.0000 mL | ORAL_SOLUTION | Freq: Four times a day (QID) | ORAL | 0 refills | Status: DC | PRN

## 2023-10-12 MED ORDER — OSELTAMIVIR PHOSPHATE 75 MG PO CAPS: 75.0000 mg | ORAL_CAPSULE | Freq: Two times a day (BID) | ORAL | 0 refills | Status: AC

## 2023-10-12 NOTE — Telephone Encounter (Signed)
 Called pt and there was no answer- no option to leave msg. Pt never called to make appt. Will close encounter per protocol.

## 2023-10-12 NOTE — Assessment & Plan Note (Signed)
 Continue to follow with pulmonology. Continue inhaler therapy.

## 2023-10-12 NOTE — Assessment & Plan Note (Signed)
 Tested positive for influenza A today. Ordered Tamiflu. Order Promethazine DM syrup.

## 2023-10-12 NOTE — Assessment & Plan Note (Signed)
 TIA noted to be on 08/15/2023-was taken to [facility] and treated.  Never required tPA.  No residual symptoms. Continue Plavix.

## 2023-10-12 NOTE — Progress Notes (Signed)
 New Patient Office Visit  Subjective    Patient ID: Joe Green, male    DOB: May 22, 1951  Age: 73 y.o. MRN: [id number]  CC:  Chief Complaint  Patient presents with   Establish Care    Here to establish care. Had gallbladder surgery Tuesday.   URI    Got sick after gallbladder surgery. Coughing and runny nose. No sore throat.    Medication Problem    Needs pt assistance filled out for trulicity.     HPI Joe Green presents to establish care. Prior PCP was Dr. Oneta Rack at [facility]. Last physical was 3-4 months ago.   Laparoscopic cholecystectomy completed on Tuesday, 10/09/2023. Notes that he is still in pain but surgery went well.  No noted signs of infection.  History of TIA on 08/15/23- states that he could not "talk right". 911 was deployed and they took him to [facility]. Is still on plavix at this point.  Hypertension- Pt denies chest pain, SOB, dizziness, or heart palpitations.  Taking meds as directed w/o problems.  Denies medication side effects.  Is on ramipril to protect kidneys from.   COPD/interstitial lung disease-  Follows with pulmonology. Reports compliance on medications. Does not have a nebulizer treatment at home. Was going to provide one today but patient left clinic early.  Diabetes - no hypoglycemic events. No wounds or sores that are not healing well. No increased thirst or urination. Checking glucose at home. Taking medications as prescribed without any side effects. [name] (needs paperwork filled out), glimepiride, and metformin. Checks BG at home and it is averaging 129.   Hyperlipidemia - tolerating stating well with no myalgias or significant side effects.  Lab Results  Component Value Date   CHOL 102 07/27/2023   HDL 45 07/27/2023   LDLCALC 41 07/27/2023   TRIG 79 07/27/2023   CHOLHDL 2.3 07/27/2023   BPH- Sees [place] urologist for uro. Doxazosin and finasteride are not working for him. Will call  them soon.  Screenings:  Colon Cancer: Colonoscopy 3 years ago- had 2 polyps.  Lung Cancer: 5 cigarettes per day.  Diabetes: current disease- see above.  Ophthalmology: checkup anually.  Foot: no issues thus far, foot check in the future. HLD: not yet indicated. Good at last visit.   Acute Problems:  Cough- patient tested positive for influenza A today.  Patient desires Tamiflu.  States that onset was on Tuesday.  Patient notes coughing and rhinorrhea. Denies sore throat.   Outpatient Encounter Medications as of 10/12/2023  Medication Sig   acetaminophen (TYLENOL) 500 MG tablet Take 2 tablets (1,000 mg total) by mouth every 6 (six) hours. (Patient taking differently: Take 1,000 mg by mouth every 6 (six) hours as needed for moderate pain (pain score 4-6).)   albuterol (VENTOLIN HFA) 108 (90 Base) MCG/ACT inhaler Inhale 2 puffs into the lungs every 6 (six) hours as needed for wheezing or shortness of breath.   aspirin EC 81 MG tablet Take 1 tablet (81 mg total) by mouth daily.   Cinnamon 500 MG TABS Take 500 mg by mouth daily.   clopidogrel (PLAVIX) 75 MG tablet Take 1 tablet (75 mg total) by mouth daily.   docusate sodium (COLACE) 100 MG capsule Take 1 capsule (100 mg total) by mouth 2 (two) times daily. Okay to decrease to once daily or stop taking if having loose bowel movements   doxazosin (CARDURA) 8 MG tablet Take  1 tablet at Bedtime for BP &  Prostate                                                                 /                                                    TAKE                                                               BY                                                   MOUTH   Dulaglutide ([name]) 3 MG/0.5ML SOPN Inject 3 mg as directed once a week.   fexofenadine (ALLEGRA) 60 MG tablet Take 60 mg by mouth daily as needed for allergies or rhinitis.   finasteride (PROSCAR) 5 MG tablet TAKE 1 TABLET BY MOUTH ONCE DAILY FOR PROSTATE   fluticasone furoate-vilanterol (BREO  ELLIPTA) 100-25 MCG/ACT AEPB Use 1 inhalation Daily for COPD / Asthma   glimepiride (AMARYL) 4 MG tablet TAKE 1/2 TO 1 (ONE-HALF TO ONE) TABLET BY MOUTH ONCE DAILY WITH SUPPER FOR DIABETES   glucose blood test strip Test blood sugars once daily.Dx: E11.9   metFORMIN (GLUCOPHAGE-XR) 500 MG 24 hr tablet Take 1 tablets daily with meal   nitroGLYCERIN (NITROSTAT) 0.4 MG SL tablet Place 1 tablet (0.4 mg total) under the tongue every 5 (five) minutes as needed for chest pain.   oseltamivir (TAMIFLU) 75 MG capsule Take 1 capsule (75 mg total) by mouth 2 (two) times daily for 5 days.   oxyCODONE (ROXICODONE) 5 MG immediate release tablet Take 1 tablet (5 mg total) by mouth every 8 (eight) hours as needed for up to 5 days. Alternate tylenol and ibuprofen for the first few days. Take narcotic pain medication only if needed for severe/ breakthrough pain.   pramipexole (MIRAPEX) 1 MG tablet 1-2 tabs at bedtime   promethazine-dextromethorphan (PROMETHAZINE-DM) 6.25-15 MG/5ML syrup Take 5 mLs by mouth 4 (four) times daily as needed for cough.   ramipril (ALTACE) 5 MG capsule TAKE 1 CAPSULE BY MOUTH ONCE DAILY FOR  BLOOD  PRESSURE  AND  DIABETIC  KIDNEY  PROTECTION   rosuvastatin (CRESTOR) 20 MG tablet TAKE 1 TABLET BY MOUTH THREE TIMES A WEEK FOR CHOLESTEROL   Facility-Administered Encounter Medications as of 10/12/2023  Medication   regadenoson (LEXISCAN) injection SOLN 0.4 mg   technetium tetrofosmin (TC-MYOVIEW) injection 32.3 millicurie    Past Medical History:  Diagnosis Date   Aortic atherosclerosis (HCC)by Chest CT scan 11/2016 12/30/2017   Per chest CT 11/2016     Arthritis    B12 deficiency 04/22/2020   BPH (benign prostatic hyperplasia) 10/05/2014   CAD (coronary artery disease)  a. s/p stent to RCA 2005; b. LHC 2006 after Nuc with inf ischemia: patent RCA stent, D1 80%, LAD 60% => med Rx;  c. LHC 04/18/11: Proximal LAD 60% (unchanged from previous catheterization), distal LAD 65% (small  caliber), Dx 60%, proximal CFX 20%, mid CFX 50%, OM1 30%, proximal RCA 20%, mid RCA stent patent, EF 65%.=> med Rx   COPD (chronic obstructive pulmonary disease) (HCC) 10/05/2014   Gallstones 02/21/2021   GERD (gastroesophageal reflux disease)    Hepatic steatosis 02/17/2020   Per CT 02/2020     History of colonic polyps 04/23/2020   HLD (hyperlipidemia)    Hyperlipidemia associated with type 2 diabetes mellitus (HCC) 04/03/2011   Hypertension    ILD (interstitial lung disease) (HCC) 02/21/2021   Suggested by CT 02/2021 - pulmonology referral placed     Mini stroke 08/2023   [facility]   Obesity    OSA (obstructive sleep apnea)    has a cpap-does not use-has a special mouthpiece he wears at night   RLS (restless legs syndrome) 10/05/2014   Type 2 diabetes mellitus (HCC) 06/05/2014   Type II or unspecified type diabetes mellitus without mention of complication, not stated as uncontrolled    Vitamin D deficiency    Wears glasses     Past Surgical History:  Procedure Laterality Date   CARDIAC CATHETERIZATION  2005   stent   CHOLECYSTECTOMY N/A 10/09/2023   Procedure: LAPAROSCOPIC CHOLECYSTECTOMY WITH INDOCYANINE GREEN DYE;  Surgeon: Berna Bue, MD;  Location: [facility];  Service: General;  Laterality: N/A;   CHONDROPLASTY  06/25/2017   Procedure: CHONDROPLASTY;  Surgeon: Jodi Geralds, MD;  Location: [facility];  Service: Orthopedics;;   COLONOSCOPY  11/2011   Due 5 years Dr. Russella Dar   heart stent  2005   RCA   INGUINAL HERNIA REPAIR Right 11/16/2014   Procedure: RIGHT INGUINAL HERNIA REPAIR WITH MESH;  Surgeon: Darnell Level, MD;  Location: [facility];  Service: General;  Laterality: Right;   INSERTION OF MESH Right 11/16/2014   Procedure: INSERTION OF MESH;  Surgeon: Darnell Level, MD;  Location: [facility];  Service: General;  Laterality: Right;   KNEE ARTHROSCOPY WITH MEDIAL MENISECTOMY Bilateral 06/25/2017   Procedure: right knee  arthroscopy with medial meniscectomy, chondroplasty, Left knee arthroscopy with medial menisectomy removal of plica and chondroplasty;  Surgeon: Jodi Geralds, MD;  Location: [facility];  Service: Orthopedics;  Laterality: Bilateral;   POLYPECTOMY      Family History  Problem Relation Age of Onset   Cancer Mother        vaginal?    Prostate cancer Father    Hypertension Father    Breast cancer Sister    Breast cancer Sister    Kidney cancer Maternal Grandmother    Colon cancer Neg Hx    Colon polyps Neg Hx    Esophageal cancer Neg Hx    Rectal cancer Neg Hx    Stomach cancer Neg Hx     Social History   Socioeconomic History   Marital status: Married    Spouse name: Not on file   Number of children: 3   Years of education: Not on file   Highest education level: Not on file  Occupational History   Not on file  Tobacco Use   Smoking status: Some Days    Current packs/day: 0.25    Average packs/day: 0.2 packs/day for 55.2 years (13.8 ttl pk-yrs)  Types: Cigarettes    Start date: 1970    Passive exposure: Current   Smokeless tobacco: Never   Tobacco comments:    3-4 cigs a day 10/12/2023  Vaping Use   Vaping status: Never Used  Substance and Sexual Activity   Alcohol use: Yes    Alcohol/week: 14.0 standard drinks of alcohol    Types: 14 Cans of beer per week   Drug use: No   Sexual activity: Yes    Partners: Female    Birth control/protection: Post-menopausal  Other Topics Concern   Not on file  Social History Narrative   Not on file   Social Drivers of Health   Financial Resource Strain: Not on file  Food Insecurity: No Food Insecurity (10/12/2023)   Hunger Vital Sign    Worried About Running Out of Food in the Last Year: Never true    Ran Out of Food in the Last Year: Never true  Transportation Needs: No Transportation Needs (10/12/2023)   PRAPARE - Administrator, Civil Service (Medical): No    Lack of Transportation  (Non-Medical): No  Physical Activity: Sufficiently Active (01/01/2018)   Exercise Vital Sign    Days of Exercise per Week: 5 days    Minutes of Exercise per Session: 60 min  Stress: No Stress Concern Present (03/10/2019)   [employer] of Occupational Health - Occupational Stress Questionnaire    Feeling of Stress : Only a little  Social Connections: Not on file  Intimate Partner Violence: Not At Risk (10/12/2023)   Humiliation, Afraid, Rape, and Kick questionnaire    Fear of Current or Ex-Partner: No    Emotionally Abused: No    Physically Abused: No    Sexually Abused: No    ROS  Per HPI      Objective    BP 130/71 (BP Location: Right Arm, Patient Position: Sitting, Cuff Size: Normal)   Pulse (!) 127   Temp 99.5 F (37.5 C) (Oral)   Ht 5' 8.5" (1.74 m)   Wt 184 lb 11.2 oz (83.8 kg)   SpO2 91%   BMI 27.67 kg/m   Physical Exam      Assessment & Plan:   Encounter to establish care  Flu-like symptoms -     POC COVID-19 BinaxNow -     POCT Influenza A/B  Influenza A Assessment & Plan: Tested positive for influenza A today. Ordered Tamiflu. Order Promethazine DM syrup.  Orders: -     Oseltamivir Phosphate; Take 1 capsule (75 mg total) by mouth 2 (two) times daily for 5 days.  Dispense: 10 capsule; Refill: 0 -     Promethazine-DM; Take 5 mLs by mouth 4 (four) times daily as needed for cough.  Dispense: 118 mL; Refill: 0  Type 2 diabetes mellitus with stage 2 chronic kidney disease, without long-term current use of insulin (HCC) Assessment & Plan: Last fasting blood glucose at home around 129.  Continue [name], glimepiride, and metformin.  He states that he requires patient assistance paperwork to be filled out-will provide. Checking glucose at home. Recheck A1c upcoming.   Hyperlipidemia associated with type 2 diabetes mellitus (HCC) Assessment & Plan: Stable-continue current regimen.   TIA (transient ischemic attack) Assessment & Plan: TIA  noted to be on 08/15/2023-was taken to [facility] and treated.  Never required tPA.  No residual symptoms. Continue Plavix.   Chronic obstructive pulmonary disease, unspecified COPD type (HCC) Assessment & Plan: Continue to follow with pulmonology. Continue inhaler therapy.  Further discuss smoking cessation at next visit.   ILD (interstitial lung disease) (HCC) Assessment & Plan: Continue to follow with pulmonology. Continue inhaler therapy.    I have spent greater than 45 minutes charting, educating, diagnosing and managing this patient for this visit.   Return in about 3 months (around 01/09/2024) for Chronic Followup.   Teryl Lucy Katherin Ramey, PA-C

## 2023-10-12 NOTE — Assessment & Plan Note (Signed)
 Last fasting blood glucose at home around 129.  Continue [name], glimepiride, and metformin.  He states that he requires patient assistance paperwork to be filled out-will provide. Checking glucose at home. Recheck A1c upcoming.

## 2023-10-12 NOTE — Assessment & Plan Note (Signed)
 Stable continue current regimen.

## 2023-10-12 NOTE — Patient Instructions (Addendum)
 It was nice to see you today!  As we discussed in clinic I will call you in a cough syrup called promethazine DM, please make sure to just take this at night initially as it can make you drowsy.  I will also be sending in Tamiflu for you most common side effect for Tamiflu will be nausea or other GI symptoms.  She have any issues with any of your medications please do let me know.  Will give you a call when your paperwork is finished so that you can come back to pick it up.  If you have any problems before your next visit feel free to message me via MyChart (minor issues or questions) or call the office, otherwise you may reach out to schedule an office visit.  Thank you! Gerilyn Pilgrim Derian Dimalanta, PA-C

## 2023-10-12 NOTE — Assessment & Plan Note (Addendum)
 Continue to follow with pulmonology. Continue inhaler therapy. Further discuss smoking cessation at next visit.

## 2023-10-17 ENCOUNTER — Telehealth (HOSPITAL_BASED_OUTPATIENT_CLINIC_OR_DEPARTMENT_OTHER): Payer: Self-pay

## 2023-10-17 ENCOUNTER — Telehealth (HOSPITAL_BASED_OUTPATIENT_CLINIC_OR_DEPARTMENT_OTHER): Payer: Self-pay | Admitting: Student

## 2023-10-17 NOTE — Telephone Encounter (Signed)
 Copied from CRM [id number]. Topic: General - Other >> Oct 17, 2023 10:54 AM Carlatta H wrote: Reason for CRM: Patient dropped off forms on 2/28 to be completed by Dr Ranae Plumber for [employer] for medication//Please call patient to advise if form can be faxed from the office to [facility]//

## 2023-10-17 NOTE — Telephone Encounter (Signed)
 Paperwork

## 2023-10-18 ENCOUNTER — Telehealth (HOSPITAL_BASED_OUTPATIENT_CLINIC_OR_DEPARTMENT_OTHER): Payer: Self-pay

## 2023-10-18 ENCOUNTER — Other Ambulatory Visit (HOSPITAL_BASED_OUTPATIENT_CLINIC_OR_DEPARTMENT_OTHER): Payer: Self-pay

## 2023-10-18 NOTE — Telephone Encounter (Signed)
 [name] pt assistance approved

## 2023-10-22 ENCOUNTER — Ambulatory Visit: Payer: Medicare HMO | Admitting: Cardiovascular Disease

## 2023-10-25 ENCOUNTER — Telehealth: Payer: Self-pay | Admitting: Cardiovascular Disease

## 2023-10-25 DIAGNOSIS — I700 Atherosclerosis of aorta: Secondary | ICD-10-CM

## 2023-10-25 DIAGNOSIS — E782 Mixed hyperlipidemia: Secondary | ICD-10-CM

## 2023-10-25 DIAGNOSIS — I2510 Atherosclerotic heart disease of native coronary artery without angina pectoris: Secondary | ICD-10-CM

## 2023-10-25 DIAGNOSIS — Z136 Encounter for screening for cardiovascular disorders: Secondary | ICD-10-CM

## 2023-10-25 DIAGNOSIS — I10 Essential (primary) hypertension: Secondary | ICD-10-CM

## 2023-10-25 NOTE — Telephone Encounter (Signed)
 Pt requesting an order to have an Aorta Ultrasound. Please advise

## 2023-10-26 NOTE — Telephone Encounter (Signed)
 Called patient back about quest for ultrasound. Per Dr. Eden Emms, [facility] to order screening abdominal US to r/o AAA. Patient given recommendations. Patient verbalized understanding.

## 2023-10-30 ENCOUNTER — Ambulatory Visit: Payer: Medicare HMO | Admitting: Internal Medicine

## 2023-10-30 ENCOUNTER — Telehealth: Payer: Self-pay | Admitting: Pharmacy Technician

## 2023-10-30 DIAGNOSIS — Z5986 Financial insecurity: Secondary | ICD-10-CM

## 2023-10-30 NOTE — Progress Notes (Addendum)
 Pharmacy Medication Assistance Program Note    10/30/2023  Patient ID: Joe Green, male   DOB: [birth year], 73 y.o.   MRN: [id number]     07/02/2023 10/30/2023  Outreach Medication One  Initial Outreach Date (Medication One) 07/02/2023   Manufacturer [employer]ication [employer] [name]   Dose of [name] 3mg /0.49ml   Type of Radiographer, therapeutic Assistance   Date Application Sent to Patient 07/04/2023   Application Items Requested Application;Proof of Income;Other   Date Application Sent to Prescriber 07/04/2023   Name of Prescriber Anda Kraft   Date Application Received From Patient  10/17/2023  Application Items Received From Patient  Application  Date Application Received From Provider  10/17/2023  Date Application Submitted to Manufacturer  10/17/2023  Method Application Sent to Manufacturer  Fax  Patient Assistance Determination  Approved  Approval Start Date  10/18/2023  Approval End Date  08/13/2024  Patient Notification Method  Telephone Call  Telephone Call Outcome  Successful    Care coordination call placed to [name] in regard to [name] application.   Spoke to Irene who informs patient is APPROVED 10/18/23-08/13/24. Initial and subsequent refills will process automatically and be delivered to his home address. Per Lajoyce Corners, that initial shipment processed on 10/29/23 and the delivery is expected today 10/30/23.  Successful outreach to patient. HIPAA verified. Patient was informed of his approval, refill procedure and expected delivery date of 1st shipment for 2025. Patient was provided phone number if delivery does not arrive today. Will route message to [facility] PharmD Ricka Burdock as Lorain Childes and for case closure.  ADDENDUM 10/30/2023 Incoming call received from patient and he was calling to inform and confirm receipt of [name] from [employer] patient assistance program.  Pattricia Boss, CPhT [place]  Office: [phone] Fax: [phone] Email:  [email or web address]

## 2023-11-19 ENCOUNTER — Ambulatory Visit (HOSPITAL_BASED_OUTPATIENT_CLINIC_OR_DEPARTMENT_OTHER): Payer: Medicare HMO | Admitting: Family Medicine

## 2023-11-19 ENCOUNTER — Ambulatory Visit (HOSPITAL_COMMUNITY)
Admission: RE | Admit: 2023-11-19 | Discharge: 2023-11-19 | Disposition: A | Discharge status: Home / Self Care | Source: Ambulatory Visit | Attending: Cardiovascular Disease | Admitting: Cardiovascular Disease

## 2023-11-19 DIAGNOSIS — I2510 Atherosclerotic heart disease of native coronary artery without angina pectoris: Secondary | ICD-10-CM

## 2023-11-19 DIAGNOSIS — I700 Atherosclerosis of aorta: Secondary | ICD-10-CM | POA: Diagnosis present

## 2023-11-19 DIAGNOSIS — E782 Mixed hyperlipidemia: Secondary | ICD-10-CM

## 2023-11-19 DIAGNOSIS — Z136 Encounter for screening for cardiovascular disorders: Secondary | ICD-10-CM | POA: Diagnosis not present

## 2023-11-19 DIAGNOSIS — F17210 Nicotine dependence, cigarettes, uncomplicated: Secondary | ICD-10-CM | POA: Insufficient documentation

## 2023-11-19 DIAGNOSIS — I10 Essential (primary) hypertension: Secondary | ICD-10-CM

## 2023-11-26 ENCOUNTER — Encounter (HOSPITAL_BASED_OUTPATIENT_CLINIC_OR_DEPARTMENT_OTHER): Payer: Self-pay

## 2023-11-27 ENCOUNTER — Other Ambulatory Visit (HOSPITAL_BASED_OUTPATIENT_CLINIC_OR_DEPARTMENT_OTHER): Payer: Self-pay

## 2023-11-27 DIAGNOSIS — N401 Enlarged prostate with lower urinary tract symptoms: Secondary | ICD-10-CM

## 2023-11-27 MED ORDER — FINASTERIDE 5 MG PO TABS: ORAL_TABLET | ORAL | 0 refills | Status: DC

## 2023-12-11 ENCOUNTER — Other Ambulatory Visit (HOSPITAL_BASED_OUTPATIENT_CLINIC_OR_DEPARTMENT_OTHER): Payer: Self-pay | Admitting: Student

## 2023-12-11 ENCOUNTER — Other Ambulatory Visit: Payer: Self-pay

## 2023-12-11 DIAGNOSIS — E1169 Type 2 diabetes mellitus with other specified complication: Secondary | ICD-10-CM

## 2023-12-11 DIAGNOSIS — G459 Transient cerebral ischemic attack, unspecified: Secondary | ICD-10-CM

## 2023-12-11 MED ORDER — CLOPIDOGREL BISULFATE 75 MG PO TABS: 75.0000 mg | ORAL_TABLET | Freq: Every day | ORAL | 6 refills | Status: DC

## 2023-12-11 MED ORDER — ROSUVASTATIN CALCIUM 20 MG PO TABS: ORAL_TABLET | ORAL | 6 refills | Status: AC

## 2023-12-11 MED ORDER — ROSUVASTATIN CALCIUM 20 MG PO TABS: ORAL_TABLET | ORAL | 6 refills | Status: DC

## 2023-12-11 NOTE — Telephone Encounter (Signed)
 Copied from CRM [id number]. Topic: Clinical - Medication Refill >> Dec 11, 2023 11:55 AM Rosamond Comes wrote: Patient calling in requesting medication refill   Most Recent Primary Care Visit:  Provider: [name], JACOB T  Department: [facility] CARE  Visit Type: NEW PATIENT  Date: 10/12/2023  Medication: rosuvastatin  (CRESTOR ) 20 MG tablet   Has the patient contacted their pharmacy? Yes  pharmacy stated the sent request in on 04/254/25 to the provider   Is this the correct pharmacy for this prescription? Yes If no, delete pharmacy and type the correct one.  This is the patient's preferred pharmacy:  [facility] [address], [place] - [address] [facility] [place] [address] Phone: [phone] Fax: [phone]   Has the prescription been filled recently? Yes  Is the patient out of the medication? No  Has the patient been seen for an appointment in the last year OR does the patient have an upcoming appointment? Yes  Can we respond through MyChart? Yes  Agent: Please be advised that Rx refills may take up to 3 business days. We ask that you follow-up with your pharmacy. >> Dec 11, 2023 12:01 PM Rosamond Comes wrote: Patient is also asking to have clopidogrel  (PLAVIX ) 75 MG tablet  refilled.

## 2023-12-11 NOTE — Telephone Encounter (Signed)
 Copied from CRM [id number]. Topic: Clinical - Medication Refill >> Dec 11, 2023 11:55 AM Rosamond Comes wrote: Patient calling in requesting medication refill   Most Recent Primary Care Visit:  Provider: [name], JACOB T  Department: [facility] CARE  Visit Type: NEW PATIENT  Date: 10/12/2023  Medication: rosuvastatin  (CRESTOR ) 20 MG tablet   Has the patient contacted their pharmacy? Yes  pharmacy stated the sent request in on 04/254/25 to the provider   Is this the correct pharmacy for this prescription? Yes If no, delete pharmacy and type the correct one.  This is the patient's preferred pharmacy:  [facility] [address], [place] - [address] [facility] [place] [address] Phone: [phone] Fax: [phone]   Has the prescription been filled recently? Yes  Is the patient out of the medication? No  Has the patient been seen for an appointment in the last year OR does the patient have an upcoming appointment? Yes  Can we respond through MyChart? Yes  Agent: Please be advised that Rx refills may take up to 3 business days. We ask that you follow-up with your pharmacy.

## 2023-12-12 ENCOUNTER — Telehealth (HOSPITAL_BASED_OUTPATIENT_CLINIC_OR_DEPARTMENT_OTHER): Payer: Self-pay

## 2023-12-12 NOTE — Telephone Encounter (Signed)
 Spoke to pt. He is ok with 01/11/2024 appt.

## 2024-01-09 ENCOUNTER — Encounter (HOSPITAL_BASED_OUTPATIENT_CLINIC_OR_DEPARTMENT_OTHER): Payer: Self-pay | Admitting: Student

## 2024-01-09 ENCOUNTER — Ambulatory Visit (HOSPITAL_BASED_OUTPATIENT_CLINIC_OR_DEPARTMENT_OTHER): Admitting: Student

## 2024-01-09 VITALS — BP 132/80 | HR 74 | Temp 97.9°F | Resp 16 | Ht 68.0 in | Wt 195.7 lb

## 2024-01-09 DIAGNOSIS — E785 Hyperlipidemia, unspecified: Secondary | ICD-10-CM

## 2024-01-09 DIAGNOSIS — J449 Chronic obstructive pulmonary disease, unspecified: Secondary | ICD-10-CM

## 2024-01-09 DIAGNOSIS — E1169 Type 2 diabetes mellitus with other specified complication: Secondary | ICD-10-CM | POA: Diagnosis not present

## 2024-01-09 DIAGNOSIS — Z8673 Personal history of transient ischemic attack (TIA), and cerebral infarction without residual deficits: Secondary | ICD-10-CM | POA: Insufficient documentation

## 2024-01-09 DIAGNOSIS — Z7984 Long term (current) use of oral hypoglycemic drugs: Secondary | ICD-10-CM

## 2024-01-09 DIAGNOSIS — D692 Other nonthrombocytopenic purpura: Secondary | ICD-10-CM | POA: Insufficient documentation

## 2024-01-09 DIAGNOSIS — N182 Chronic kidney disease, stage 2 (mild): Secondary | ICD-10-CM

## 2024-01-09 DIAGNOSIS — I10 Essential (primary) hypertension: Secondary | ICD-10-CM

## 2024-01-09 DIAGNOSIS — Z7901 Long term (current) use of anticoagulants: Secondary | ICD-10-CM

## 2024-01-09 DIAGNOSIS — E1122 Type 2 diabetes mellitus with diabetic chronic kidney disease: Secondary | ICD-10-CM

## 2024-01-09 NOTE — Assessment & Plan Note (Signed)
 Chronic, stable continue current regimen.

## 2024-01-09 NOTE — Progress Notes (Signed)
 Established Patient Office Visit  Subjective   Patient ID: Joe Green, male    DOB: 18-Apr-1951  Age: 73 y.o. MRN: [id number]  Chief Complaint  Patient presents with   Medical Management of Chronic Issues    Here for follow up. With health issues right now, ok to get shingrix.     HPI  Discussed the use of AI scribe software for clinical note transcription with the patient, who gave verbal consent to proceed.  History of Present Illness   Joe Green "Kurt Phi" is a 73 year old male with diabetes and coronary artery disease who presents for follow-up regarding medication management and blood pressure monitoring.  He is currently taking Plavix  and aspirin  as anticoagulants due to a history of transient ischemic attacks. He is contemplating discontinuing Plavix  after six months of use following a hospital visit in January. He is concerned about the risk of stroke, especially given a family history of strokes on his son's mother's side.  He monitors his blood pressure at home, which averages around 120/76 mmHg. He takes ramipril  5 mg daily, primarily for renal protection due to diabetes. He reports no issues with his blood pressure.  He takes Crestor  20 mg three times a week due to previous muscle aches when taken daily. Additionally, he uses an over-the-counter supplement called Colestal, which contains plant sterols, though not regularly.  He has a history of diabetes and monitors his blood glucose at home. He uses [name]  without issues, and his prescription is valid until the end of the year.  No recent episodes of dyspnea or angina. He uses inhalers for respiratory issues, one as needed and the other not daily. He reports a slight wheeze at the end of deep breaths.  He remains active in his workshop, working on his grandson's truck, and has a history of working as a Curator. He resides between [facility] and [employer] and uses local honey for allergies.      Patient  Active Problem List   Diagnosis Date Noted   Senile purpura (HCC) 01/09/2024   History of TIA (transient ischemic attack) 01/09/2024   Influenza A 10/12/2023   ILD (interstitial lung disease) (HCC) 02/21/2021   Gallstones 02/21/2021   History of colonic polyps 04/23/2020   Excessive drinking alcohol 04/23/2020   B12 deficiency 04/22/2020   Hepatic steatosis 02/17/2020   Overweight (BMI 25.0-29.9) 12/30/2017   Aortic atherosclerosis (HCC)by Chest CT scan 11/2016 12/30/2017   Chondromalacia, left knee 06/25/2017   Chondromalacia, right knee 06/25/2017   Elevated PSA 11/18/2015   RLS (restless legs syndrome) 10/05/2014   COPD (chronic obstructive pulmonary disease) (HCC) 10/05/2014   BPH (benign prostatic hyperplasia) 10/05/2014   Type 2 diabetes mellitus (HCC) 06/05/2014   Vitamin D  deficiency 04/01/2014   Medication management 04/01/2014   Smoking 07/21/2013   Hypertension    OSA and COPD overlap syndrome (HCC)    Hyperlipidemia associated with type 2 diabetes mellitus (HCC) 04/03/2011   CAD (coronary artery disease) 04/03/2011   Past Medical History:  Diagnosis Date   Aortic atherosclerosis (HCC)by Chest CT scan 11/2016 12/30/2017   Per chest CT 11/2016     Arthritis    B12 deficiency 04/22/2020   BPH (benign prostatic hyperplasia) 10/05/2014   CAD (coronary artery disease)    a. s/p stent to RCA 2005; b. LHC 2006 after Nuc with inf ischemia: patent RCA stent, D1 80%, LAD 60% => med Rx;  c. LHC 04/18/11: Proximal LAD 60% (unchanged from previous catheterization),  distal LAD 65% (small caliber), Dx 60%, proximal CFX 20%, mid CFX 50%, OM1 30%, proximal RCA 20%, mid RCA stent patent, EF 65%.=> med Rx   COPD (chronic obstructive pulmonary disease) (HCC) 10/05/2014   Gallstones 02/21/2021   GERD (gastroesophageal reflux disease)    Hepatic steatosis 02/17/2020   Per CT 02/2020     History of colonic polyps 04/23/2020   HLD (hyperlipidemia)    Hyperlipidemia associated with type 2  diabetes mellitus (HCC) 04/03/2011   Hypertension    ILD (interstitial lung disease) (HCC) 02/21/2021   Suggested by CT 02/2021 - pulmonology referral placed     Mini stroke 08/2023   [facility] hospital   Obesity    OSA (obstructive sleep apnea)    has a cpap-does not use-has a special mouthpiece he wears at night   RLS (restless legs syndrome) 10/05/2014   Type 2 diabetes mellitus (HCC) 06/05/2014   Type II or unspecified type diabetes mellitus without mention of complication, not stated as uncontrolled    Vitamin D  deficiency    Wears glasses    Social History   Tobacco Use   Smoking status: Some Days    Current packs/day: 0.25    Average packs/day: 0.3 packs/day for 55.4 years (13.9 ttl pk-yrs)    Types: Cigarettes    Start date: 1970    Passive exposure: Current   Smokeless tobacco: Never   Tobacco comments:    3-4 cigs a day 10/12/2023  Vaping Use   Vaping status: Never Used  Substance Use Topics   Alcohol use: Yes    Alcohol/week: 14.0 standard drinks of alcohol    Types: 14 Cans of beer per week   Drug use: No   No Known Allergies    ROS Per HPI.    Objective:     BP 132/80   Pulse 74   Temp 97.9 F (36.6 C) (Oral)   Resp 16   Ht 5\' 8"  (1.727 m)   Wt 195 lb 11.2 oz (88.8 kg)   SpO2 94%   BMI 29.76 kg/m  BP Readings from Last 3 Encounters:  01/09/24 132/80  10/12/23 130/71  10/09/23 (!) 122/91   Wt Readings from Last 3 Encounters:  01/09/24 195 lb 11.2 oz (88.8 kg)  10/12/23 184 lb 11.2 oz (83.8 kg)  10/09/23 191 lb (86.6 kg)      Physical Exam Constitutional:      General: He is not in acute distress.    Appearance: Normal appearance. He is not ill-appearing.  HENT:     Head: Normocephalic and atraumatic.     Right Ear: External ear normal.     Left Ear: External ear normal.     Nose: Nose normal.     Mouth/Throat:     Mouth: Mucous membranes are moist.     Pharynx: Oropharynx is clear.  Eyes:     General: No scleral icterus.     Extraocular Movements: Extraocular movements intact.     Conjunctiva/sclera: Conjunctivae normal.     Pupils: Pupils are equal, round, and reactive to light.  Neck:     Vascular: No carotid bruit.  Cardiovascular:     Rate and Rhythm: Normal rate and regular rhythm.     Pulses: Normal pulses.     Heart sounds: Normal heart sounds. No murmur heard.    No friction rub.  Pulmonary:     Effort: Pulmonary effort is normal. No respiratory distress.     Breath sounds: Normal breath sounds.  No wheezing, rhonchi or rales.  Musculoskeletal:        General: Normal range of motion.     Cervical back: Neck supple.     Right lower leg: No edema.     Left lower leg: No edema.  Skin:    General: Skin is warm and dry.     Coloration: Skin is not jaundiced or pale.     Comments: Senile purpura bilaterally  Neurological:     General: No focal deficit present.     Mental Status: He is alert.  Psychiatric:        Mood and Affect: Mood normal.        Behavior: Behavior normal.      No results found for any visits on 01/09/24.  Last CBC Lab Results  Component Value Date   WBC 7.9 10/02/2023   HGB 13.6 10/02/2023   HCT 41.7 10/02/2023   MCV 94.8 10/02/2023   MCH 30.9 10/02/2023   RDW 12.4 10/02/2023   PLT 170 10/02/2023   Last metabolic panel Lab Results  Component Value Date   GLUCOSE 136 (H) 10/02/2023   NA 136 10/02/2023   K 4.1 10/02/2023   CL 102 10/02/2023   CO2 23 10/02/2023   BUN 12 10/02/2023   CREATININE 0.72 10/02/2023   GFRNONAA >60 10/02/2023   CALCIUM  9.3 10/02/2023   PROT 6.5 08/30/2023   ALBUMIN 3.4 (L) 10/31/2022   BILITOT 0.5 08/30/2023   ALKPHOS 102 10/31/2022   AST 12 08/30/2023   ALT 17 08/30/2023   ANIONGAP 11 10/02/2023   Last lipids Lab Results  Component Value Date   CHOL 102 07/27/2023   HDL 45 07/27/2023   LDLCALC 41 07/27/2023   TRIG 79 07/27/2023   CHOLHDL 2.3 07/27/2023   Last hemoglobin A1c Lab Results  Component Value Date   HGBA1C  6.6 (H) 10/02/2023      The ASCVD Risk score (Arnett DK, et al., 2019) failed to calculate for the following reasons:   The valid total cholesterol range is 130 to 320 mg/dL    Assessment & Plan:   Type 2 diabetes mellitus with stage 2 chronic kidney disease, without long-term current use of insulin  (HCC) Assessment & Plan: Chronic, stable.Diabetes management is well-controlled. Blood sugar monitoring is occasionally missed due to meal timing. No significant issues with [name] , prescription valid until year-end. Use of local honey for allergies may slightly affect blood sugar levels. - Continue [name]  as prescribed - Schedule foot exam during next physical to assess for diabetic complications  Orders: -     CBC with Differential/Platelet; Future -     Comprehensive metabolic panel with GFR; Future -     Hemoglobin A1c; Future  Senile purpura (HCC) Assessment & Plan: Chronic with no concerns.  Patient presents today with senile purpura on bilateral arms, he is currently on dual antiplatelet therapy.   Current use of long term anticoagulation  Primary hypertension Assessment & Plan: Chronic, stable.Hypertension is well-controlled with current medication regimen. Home readings average 120/76 mmHg. Ramipril  is taken primarily for renal protection due to diabetes. - Continue ramipril  5 mg daily  Orders: -     CBC with Differential/Platelet; Future -     Comprehensive metabolic panel with GFR; Future  Hyperlipidemia associated with type 2 diabetes mellitus (HCC) Assessment & Plan: Chronic, stable-continue current regimen.  Orders: -     Lipid panel; Future  History of TIA (transient ischemic attack) Assessment & Plan: Ongoing Plavix  therapy post-TIA. Advised  to take Plavix  for six months post-event. Discussion about continued anticoagulation therapy is ongoing, pending hospital records confirmation. Concern expressed about blood thinners due to senile purpura. - Contact  hospital to confirm recommendations regarding Plavix  and aspirin  therapy - Review hospital records for any genetic testing related to clotting disorders   Chronic obstructive pulmonary disease, unspecified COPD type (HCC) Assessment & Plan: Chronic, not at goal due to noncompliance. Mild wheezing on examination. He has inhalers but does not use the daily inhaler regularly. Oxygen saturation is around 94%. Regular use of the daily inhaler is expected to improve respiratory function and reduce reliance on the as-needed inhaler. - Encourage regular use of daily inhaler to improve respiratory function - Monitor oxygen levels at home - Counseled on compliance-patient is in agreements.    Return in about 3 months (around 04/10/2024) for Chronic Followup.    Tina Gruner T Ovetta Bazzano, PA-C

## 2024-01-09 NOTE — Assessment & Plan Note (Addendum)
 Chronic with no concerns.  Patient presents today with senile purpura on bilateral arms, he is currently on dual antiplatelet therapy.

## 2024-01-09 NOTE — Patient Instructions (Signed)
 It was nice to see you today!  As we discussed in clinic:  I will try to let you know what to do with the plavix  but please continue it for now.   If you have any problems before your next visit feel free to message me via MyChart (minor issues or questions) or call the office, otherwise you may reach out to schedule an office visit.  Thank you! Yolinda Duerr, PA-C

## 2024-01-09 NOTE — Assessment & Plan Note (Signed)
 Ongoing Plavix  therapy post-TIA. Advised to take Plavix  for six months post-event. Discussion about continued anticoagulation therapy is ongoing, pending hospital records confirmation. Concern expressed about blood thinners due to senile purpura. - Contact hospital to confirm recommendations regarding Plavix  and aspirin  therapy - Review hospital records for any genetic testing related to clotting disorders

## 2024-01-09 NOTE — Assessment & Plan Note (Signed)
 Chronic, stable.Hypertension is well-controlled with current medication regimen. Home readings average 120/76 mmHg. Ramipril  is taken primarily for renal protection due to diabetes. - Continue ramipril  5 mg daily

## 2024-01-09 NOTE — Assessment & Plan Note (Signed)
 Chronic, not at goal due to noncompliance. Mild wheezing on examination. He has inhalers but does not use the daily inhaler regularly. Oxygen saturation is around 94%. Regular use of the daily inhaler is expected to improve respiratory function and reduce reliance on the as-needed inhaler. - Encourage regular use of daily inhaler to improve respiratory function - Monitor oxygen levels at home - Counseled on compliance-patient is in agreements.

## 2024-01-09 NOTE — Assessment & Plan Note (Signed)
 Chronic, stable.Diabetes management is well-controlled. Blood sugar monitoring is occasionally missed due to meal timing. No significant issues with [name] , prescription valid until year-end. Use of local honey for allergies may slightly affect blood sugar levels. - Continue [name]  as prescribed - Schedule foot exam during next physical to assess for diabetic complications

## 2024-01-10 ENCOUNTER — Telehealth (HOSPITAL_BASED_OUTPATIENT_CLINIC_OR_DEPARTMENT_OTHER): Payer: Self-pay

## 2024-01-10 NOTE — Telephone Encounter (Signed)
 Per Derwood Flor, pt should call cardio regarding plavix . RH note state to continue aspiring 81, plavix  75mg  for 21 days then transition to plavix  monotherapy. Pt said he was told to take plavix  for 6 months and then start only on aspirin  after 02/12/2024.

## 2024-01-16 ENCOUNTER — Other Ambulatory Visit (HOSPITAL_BASED_OUTPATIENT_CLINIC_OR_DEPARTMENT_OTHER): Payer: Self-pay

## 2024-01-16 ENCOUNTER — Other Ambulatory Visit (HOSPITAL_BASED_OUTPATIENT_CLINIC_OR_DEPARTMENT_OTHER)

## 2024-01-16 DIAGNOSIS — E1122 Type 2 diabetes mellitus with diabetic chronic kidney disease: Secondary | ICD-10-CM

## 2024-01-16 DIAGNOSIS — E1169 Type 2 diabetes mellitus with other specified complication: Secondary | ICD-10-CM

## 2024-01-16 DIAGNOSIS — I10 Essential (primary) hypertension: Secondary | ICD-10-CM

## 2024-01-17 ENCOUNTER — Ambulatory Visit (HOSPITAL_BASED_OUTPATIENT_CLINIC_OR_DEPARTMENT_OTHER): Payer: Self-pay | Admitting: Student

## 2024-01-17 LAB — CBC WITH DIFFERENTIAL/PLATELET
Basophils Absolute: 0.1 10*3/uL (ref 0.0–0.2)
Basos: 1 %
EOS (ABSOLUTE): 0.4 10*3/uL (ref 0.0–0.4)
Eos: 4 %
Hematocrit: 43.2 % (ref 37.5–51.0)
Hemoglobin: 14.3 g/dL (ref 13.0–17.7)
Immature Grans (Abs): 0 10*3/uL (ref 0.0–0.1)
Immature Granulocytes: 0 %
Lymphocytes Absolute: 1.7 10*3/uL (ref 0.7–3.1)
Lymphs: 18 %
MCH: 31.7 pg (ref 26.6–33.0)
MCHC: 33.1 g/dL (ref 31.5–35.7)
MCV: 96 fL (ref 79–97)
Monocytes Absolute: 0.6 10*3/uL (ref 0.1–0.9)
Monocytes: 6 %
Neutrophils Absolute: 6.7 10*3/uL (ref 1.4–7.0)
Neutrophils: 71 %
Platelets: 171 10*3/uL (ref 150–450)
RBC: 4.51 x10E6/uL (ref 4.14–5.80)
RDW: 12.1 % (ref 11.6–15.4)
WBC: 9.4 10*3/uL (ref 3.4–10.8)

## 2024-01-17 LAB — LIPID PANEL
Chol/HDL Ratio: 2.1 ratio (ref 0.0–5.0)
Cholesterol, Total: 102 mg/dL (ref 100–199)
HDL: 49 mg/dL (ref >39)
LDL Chol Calc (NIH): 40 mg/dL (ref 0–99)
Triglycerides: 55 mg/dL (ref 0–149)
VLDL Cholesterol Cal: 13 mg/dL (ref 5–40)

## 2024-01-17 LAB — COMPREHENSIVE METABOLIC PANEL WITH GFR
ALT: 29 IU/L (ref 0–44)
AST: 17 IU/L (ref 0–40)
Albumin: 4.1 g/dL (ref 3.8–4.8)
Alkaline Phosphatase: 123 IU/L — ABNORMAL HIGH (ref 44–121)
BUN/Creatinine Ratio: 9 — ABNORMAL LOW (ref 10–24)
BUN: 7 mg/dL — ABNORMAL LOW (ref 8–27)
Bilirubin Total: 0.4 mg/dL (ref 0.0–1.2)
CO2: 20 mmol/L (ref 20–29)
Calcium: 8.9 mg/dL (ref 8.6–10.2)
Chloride: 104 mmol/L (ref 96–106)
Creatinine, Ser: 0.82 mg/dL (ref 0.76–1.27)
Globulin, Total: 2.1 g/dL (ref 1.5–4.5)
Glucose: 163 mg/dL — ABNORMAL HIGH (ref 70–99)
Potassium: 4.2 mmol/L (ref 3.5–5.2)
Sodium: 139 mmol/L (ref 134–144)
Total Protein: 6.2 g/dL (ref 6.0–8.5)
eGFR: 93 mL/min/{1.73_m2} (ref >59)

## 2024-01-17 LAB — HEMOGLOBIN A1C
Est. average glucose Bld gHb Est-mCnc: 180 mg/dL
Hgb A1c MFr Bld: 7.9 % — ABNORMAL HIGH (ref 4.8–5.6)

## 2024-01-21 ENCOUNTER — Ambulatory Visit: Payer: Self-pay | Admitting: Nurse Practitioner

## 2024-01-24 ENCOUNTER — Other Ambulatory Visit (HOSPITAL_BASED_OUTPATIENT_CLINIC_OR_DEPARTMENT_OTHER): Payer: Self-pay | Admitting: Student

## 2024-01-24 DIAGNOSIS — J449 Chronic obstructive pulmonary disease, unspecified: Secondary | ICD-10-CM

## 2024-01-24 MED ORDER — FLUTICASONE FUROATE-VILANTEROL 100-25 MCG/ACT IN AEPB: INHALATION_SPRAY | RESPIRATORY_TRACT | 3 refills | Status: AC

## 2024-01-30 ENCOUNTER — Other Ambulatory Visit: Payer: Self-pay | Admitting: Family

## 2024-02-04 ENCOUNTER — Ambulatory Visit: Payer: Self-pay | Admitting: Nurse Practitioner

## 2024-02-04 ENCOUNTER — Other Ambulatory Visit (HOSPITAL_BASED_OUTPATIENT_CLINIC_OR_DEPARTMENT_OTHER): Payer: Self-pay | Admitting: Student

## 2024-02-04 DIAGNOSIS — N401 Enlarged prostate with lower urinary tract symptoms: Secondary | ICD-10-CM

## 2024-02-04 MED ORDER — RAMIPRIL 5 MG PO CAPS: ORAL_CAPSULE | ORAL | 1 refills | Status: DC

## 2024-02-04 MED ORDER — FINASTERIDE 5 MG PO TABS: ORAL_TABLET | ORAL | 1 refills | Status: DC

## 2024-02-04 NOTE — Telephone Encounter (Signed)
 Copied from CRM [id number]. Topic: Clinical - Medication Refill >> Feb 04, 2024 11:40 AM Donee H wrote: Medication: finasteride  (PROSCAR ) 5 MG tablet  ramipril  (ALTACE ) 5 MG capsule  Has the patient contacted their pharmacy? Yes, patient states there was suppose to be a request last week from pharmacy but never received refill order (Agent: If no, request that the patient contact the pharmacy for the refill. If patient does not wish to contact the pharmacy document the reason why and proceed with request.) (Agent: If yes, when and what did the pharmacy advise?)  This is the patient's preferred pharmacy:  [facility] [address], [place] - [address] [facility] [place] [address] Phone: [phone] Fax: [phone]  Is this the correct pharmacy for this prescription? Yes If no, delete pharmacy and type the correct one.   Has the prescription been filled recently? No  Is the patient out of the medication? Yes  Has the patient been seen for an appointment in the last year OR does the patient have an upcoming appointment? Yes  Can we respond through MyChart? Yes  Agent: Please be advised that Rx refills may take up to 3 business days. We ask that you follow-up with your pharmacy.

## 2024-02-05 ENCOUNTER — Other Ambulatory Visit (HOSPITAL_BASED_OUTPATIENT_CLINIC_OR_DEPARTMENT_OTHER): Payer: Self-pay

## 2024-02-05 DIAGNOSIS — N320 Bladder-neck obstruction: Secondary | ICD-10-CM

## 2024-02-05 MED ORDER — DOXAZOSIN MESYLATE 8 MG PO TABS: ORAL_TABLET | ORAL | 1 refills | Status: DC

## 2024-02-05 NOTE — Progress Notes (Unsigned)
 [facility] is requesting refill. Last filled by previous PCP.

## 2024-04-02 ENCOUNTER — Telehealth (HOSPITAL_BASED_OUTPATIENT_CLINIC_OR_DEPARTMENT_OTHER): Payer: Self-pay

## 2024-04-02 NOTE — Telephone Encounter (Signed)
 Pt advised last labs were done 01/16/2024. We will keep appt of 04/22/2024.

## 2024-04-02 NOTE — Telephone Encounter (Signed)
 Copied from CRM [id number]. Topic: Appointments - Scheduling Inquiry for Clinic >> Apr 02, 2024  1:16 PM Charlet HERO wrote: Reason for CRM: Patient is calling about being a type 2 diabetes and he is stating that his last provider had him coming in to do lab work every 3 months and since he has been with the office this has not happened, ckd and the patients next visit is 4 months away from his last one. Patient would like to have an lab and appt every 3 months as he prvsly had bc he is a severe diabetic.

## 2024-04-08 ENCOUNTER — Other Ambulatory Visit (HOSPITAL_BASED_OUTPATIENT_CLINIC_OR_DEPARTMENT_OTHER): Payer: Self-pay

## 2024-04-08 DIAGNOSIS — E1169 Type 2 diabetes mellitus with other specified complication: Secondary | ICD-10-CM

## 2024-04-08 DIAGNOSIS — E1122 Type 2 diabetes mellitus with diabetic chronic kidney disease: Secondary | ICD-10-CM

## 2024-04-08 NOTE — Progress Notes (Unsigned)
 We received refill for metformin  ER 500mg  tablets for 2 tablets twice a day with meals. Pt verified that he is only doing metformin  er 500mg  1 tablet once a day. He said he can increase if needed. FWD to Pcp for advising.

## 2024-04-09 ENCOUNTER — Other Ambulatory Visit (HOSPITAL_BASED_OUTPATIENT_CLINIC_OR_DEPARTMENT_OTHER): Payer: Self-pay | Admitting: Student

## 2024-04-09 ENCOUNTER — Encounter (HOSPITAL_BASED_OUTPATIENT_CLINIC_OR_DEPARTMENT_OTHER): Payer: Self-pay

## 2024-04-09 DIAGNOSIS — E1169 Type 2 diabetes mellitus with other specified complication: Secondary | ICD-10-CM

## 2024-04-09 DIAGNOSIS — E1122 Type 2 diabetes mellitus with diabetic chronic kidney disease: Secondary | ICD-10-CM

## 2024-04-09 MED ORDER — METFORMIN HCL ER 500 MG PO TB24: ORAL_TABLET | ORAL | 6 refills | Status: AC

## 2024-04-09 MED ORDER — METFORMIN HCL ER 500 MG PO TB24: ORAL_TABLET | ORAL | 6 refills | Status: DC

## 2024-04-22 ENCOUNTER — Encounter (HOSPITAL_BASED_OUTPATIENT_CLINIC_OR_DEPARTMENT_OTHER): Payer: Self-pay | Admitting: Student

## 2024-04-22 ENCOUNTER — Ambulatory Visit (HOSPITAL_BASED_OUTPATIENT_CLINIC_OR_DEPARTMENT_OTHER): Admitting: Student

## 2024-04-22 VITALS — BP 120/68 | HR 69 | Temp 97.5°F | Resp 16 | Ht 68.0 in | Wt 196.8 lb

## 2024-04-22 DIAGNOSIS — I2510 Atherosclerotic heart disease of native coronary artery without angina pectoris: Secondary | ICD-10-CM

## 2024-04-22 DIAGNOSIS — E1169 Type 2 diabetes mellitus with other specified complication: Secondary | ICD-10-CM | POA: Diagnosis not present

## 2024-04-22 DIAGNOSIS — Z8673 Personal history of transient ischemic attack (TIA), and cerebral infarction without residual deficits: Secondary | ICD-10-CM | POA: Diagnosis not present

## 2024-04-22 DIAGNOSIS — Z0000 Encounter for general adult medical examination without abnormal findings: Secondary | ICD-10-CM

## 2024-04-22 DIAGNOSIS — E1122 Type 2 diabetes mellitus with diabetic chronic kidney disease: Secondary | ICD-10-CM

## 2024-04-22 DIAGNOSIS — Z7984 Long term (current) use of oral hypoglycemic drugs: Secondary | ICD-10-CM

## 2024-04-22 DIAGNOSIS — F1010 Alcohol abuse, uncomplicated: Secondary | ICD-10-CM | POA: Diagnosis not present

## 2024-04-22 DIAGNOSIS — E785 Hyperlipidemia, unspecified: Secondary | ICD-10-CM | POA: Diagnosis not present

## 2024-04-22 DIAGNOSIS — I10 Essential (primary) hypertension: Secondary | ICD-10-CM | POA: Diagnosis not present

## 2024-04-22 DIAGNOSIS — N182 Chronic kidney disease, stage 2 (mild): Secondary | ICD-10-CM

## 2024-04-22 DIAGNOSIS — Z1211 Encounter for screening for malignant neoplasm of colon: Secondary | ICD-10-CM

## 2024-04-22 MED ORDER — LANCET DEVICE MISC: 1.0000 | Freq: Three times a day (TID) | 0 refills | Status: AC

## 2024-04-22 MED ORDER — BLOOD GLUCOSE MONITORING SUPPL DEVI: 1.0000 | Freq: Three times a day (TID) | 0 refills | Status: AC

## 2024-04-22 MED ORDER — LANCETS MISC. MISC: 1.0000 | Freq: Three times a day (TID) | 99 refills | Status: AC

## 2024-04-22 MED ORDER — BLOOD GLUCOSE TEST VI STRP: 1.0000 | ORAL_STRIP | Freq: Three times a day (TID) | 99 refills | Status: AC

## 2024-04-22 NOTE — Patient Instructions (Signed)
 It was nice to see you today!  If you have any problems before your next visit feel free to message me via MyChart (minor issues or questions) or call the office, otherwise you may reach out to schedule an office visit.  Thank you! Pau Banh, PA-C

## 2024-04-22 NOTE — Progress Notes (Signed)
 Complete physical exam  Patient: Joe Green   DOB: [birth year]   73 y.o. Male  MRN: [id number]  Subjective:    Chief Complaint  Patient presents with   Medical Management of Chronic Issues    Follow up. Discuss Metformin  ER 500mg . Does not want to take 4 tablets daily, has a friend who took 2 tablets and ended up having kidney issues. Needs colonoscopy referral.     Discussed the use of AI scribe software for clinical note transcription with the patient, who gave verbal consent to proceed.  History of Present Illness   Joe Green is a 73 year old male with type 2 diabetes who presents for a follow-up visit.  He is concerned about managing his type 2 diabetes. He takes metformin  2000 mg daily, divided into four 500 mg tablets, and [name] . He is worried about the safety of metformin  based on anecdotal reports but has not experienced any adverse effects. His last A1c was 7.9%, and he admits to dietary indiscretions, particularly with carbohydrates, which he believes contribute to his elevated A1c.  He takes Crestor  three times a week for hyperlipidemia and reports no muscle aches or joint pains. He is due for a colonoscopy and has no symptoms such as blood in stool.  He has a history of gallbladder surgery about a year ago and was cleared by cardiology for the procedure. He has not had a recent cardiology appointment since February and is due for a follow-up.  He has issues with dental care due to insurance limitations, having had two teeth extracted and currently has a loose tooth. He is considering dentures due to the cost of dental procedures not being covered by his insurance.  He consumes two to three beers daily and has a history of higher consumption.  He has a pulmonology appointment scheduled for the 17th of this month. He checks his blood sugar at home but notes his meter is old and not reliable for tracking trends. His wife has used it as well, affecting  the accuracy of his readings.  He is not engaged in a structured exercise program but stays active with body work on cars. He acknowledges the need to increase his physical activity, particularly walking, and has a step counter to monitor his activity levels.      Most recent fall risk assessment:    04/22/2024    1:09 PM  Fall Risk   Falls in the past year? 0  Number falls in past yr: 0  Injury with Fall? 0  Risk for fall due to : No Fall Risks  Follow up Falls evaluation completed;Education provided;Falls prevention discussed     Most recent depression screenings:    04/22/2024    1:09 PM 10/12/2023   10:28 AM  PHQ 2/9 Scores  PHQ - 2 Score 0 2  PHQ- 9 Score  3    Patient Active Problem List   Diagnosis Date Noted   Senile purpura (HCC) 01/09/2024   History of TIA (transient ischemic attack) 01/09/2024   Influenza A 10/12/2023   ILD (interstitial lung disease) (HCC) 02/21/2021   Gallstones 02/21/2021   History of colonic polyps 04/23/2020   Excessive drinking alcohol 04/23/2020   B12 deficiency 04/22/2020   Hepatic steatosis 02/17/2020   Overweight (BMI 25.0-29.9) 12/30/2017   Aortic atherosclerosis (HCC)by Chest CT scan 11/2016 12/30/2017   Chondromalacia, left knee 06/25/2017   Chondromalacia, right knee 06/25/2017   Elevated PSA 11/18/2015   RLS (restless  legs syndrome) 10/05/2014   COPD (chronic obstructive pulmonary disease) (HCC) 10/05/2014   BPH (benign prostatic hyperplasia) 10/05/2014   Type 2 diabetes mellitus (HCC) 06/05/2014   Vitamin D  deficiency 04/01/2014   Medication management 04/01/2014   Smoking 07/21/2013   Hypertension    OSA and COPD overlap syndrome (HCC)    Hyperlipidemia associated with type 2 diabetes mellitus (HCC) 04/03/2011   CAD (coronary artery disease) 04/03/2011   Past Medical History:  Diagnosis Date   Aortic atherosclerosis (HCC)by Chest CT scan 11/2016 12/30/2017   Per chest CT 11/2016     Arthritis    B12 deficiency  04/22/2020   BPH (benign prostatic hyperplasia) 10/05/2014   CAD (coronary artery disease)    a. s/p stent to RCA 2005; b. LHC 2006 after Nuc with inf ischemia: patent RCA stent, D1 80%, LAD 60% => med Rx;  c. LHC 04/18/11: Proximal LAD 60% (unchanged from previous catheterization), distal LAD 65% (small caliber), Dx 60%, proximal CFX 20%, mid CFX 50%, OM1 30%, proximal RCA 20%, mid RCA stent patent, EF 65%.=> med Rx   COPD (chronic obstructive pulmonary disease) (HCC) 10/05/2014   Gallstones 02/21/2021   GERD (gastroesophageal reflux disease)    Hepatic steatosis 02/17/2020   Per CT 02/2020     History of colonic polyps 04/23/2020   HLD (hyperlipidemia)    Hyperlipidemia associated with type 2 diabetes mellitus (HCC) 04/03/2011   Hypertension    ILD (interstitial lung disease) (HCC) 02/21/2021   Suggested by CT 02/2021 - pulmonology referral placed     Mini stroke 08/2023   [facility]   Obesity    OSA (obstructive sleep apnea)    has a cpap-does not use-has a special mouthpiece he wears at night   RLS (restless legs syndrome) 10/05/2014   Type 2 diabetes mellitus (HCC) 06/05/2014   Type II or unspecified type diabetes mellitus without mention of complication, not stated as uncontrolled    Vitamin D  deficiency    Wears glasses    Social History   Tobacco Use   Smoking status: Some Days    Current packs/day: 0.25    Average packs/day: 0.3 packs/day for 55.7 years (13.9 ttl pk-yrs)    Types: Cigarettes    Start date: 1970    Passive exposure: Current   Smokeless tobacco: Never   Tobacco comments:    3-4 cigs a day 10/12/2023  Vaping Use   Vaping status: Never Used  Substance Use Topics   Alcohol use: Not Currently    Comment: 14 a week   Drug use: No   No Known Allergies    Patient Care Team: Reynolds Kittel T, PA-C as PCP - General (Physician Assistant) Delford Maude BROCKS, MD as PCP - Cardiology (Cardiology) Delford Maude BROCKS, MD as Consulting Physician  (Cardiology) Aneita Gwendlyn DASEN, MD (Inactive) as Consulting Physician (Gastroenterology) [name], [facility] (Inactive) as Pharmacist (Pharmacist)   Outpatient Medications Prior to Visit  Medication Sig   acetaminophen  (TYLENOL ) 500 MG tablet Take 2 tablets (1,000 mg total) by mouth every 6 (six) hours. (Patient taking differently: Take 1,000 mg by mouth every 6 (six) hours as needed for moderate pain (pain score 4-6).)   albuterol  (VENTOLIN  HFA) 108 (90 Base) MCG/ACT inhaler Inhale 2 puffs into the lungs every 6 (six) hours as needed for wheezing or shortness of breath.   Cinnamon 500 MG TABS Take 500 mg by mouth daily. (Patient taking differently: Take 500 mg by mouth as needed.)   clopidogrel  (PLAVIX ) 75  MG tablet Take 1 tablet (75 mg total) by mouth daily. (Patient taking differently: Take 75 mg by mouth daily. 6 months, STOP AFTER 02/12/2024)   doxazosin  (CARDURA ) 8 MG tablet Take 1 tablet by mouth at Bedtime for BP & Prostate.   Dulaglutide  ([name] ) 3 MG/0.5ML SOPN Inject 3 mg as directed once a week.   fexofenadine (ALLEGRA) 60 MG tablet Take 60 mg by mouth daily as needed for allergies or rhinitis.   finasteride  (PROSCAR ) 5 MG tablet TAKE 1 TABLET BY MOUTH ONCE DAILY FOR PROSTATE   fluticasone  furoate-vilanterol (BREO ELLIPTA ) 100-25 MCG/ACT AEPB Use 1 inhalation Daily for COPD / Asthma   glimepiride  (AMARYL ) 4 MG tablet TAKE 1/2 TO 1 (ONE-HALF TO ONE) TABLET BY MOUTH ONCE DAILY WITH SUPPER FOR DIABETES   metFORMIN  (GLUCOPHAGE -XR) 500 MG 24 hr tablet On week 1 take 2 tablets daily with breakfast (1,000 mg daily total), THEN on week 2 take 2 tablets with breakfast and 1 tablet with dinner (1,500 mg daily total), THEN on week 3 take 2 tablets daily with breakfast and 2 tablets daily with dinner (2,000 mg daily total). You will continue 4 tablets daily thereafter.   nitroGLYCERIN  (NITROSTAT ) 0.4 MG SL tablet Place 1 tablet (0.4 mg total) under the tongue every 5 (five) minutes as needed for  chest pain.   pramipexole  (MIRAPEX ) 1 MG tablet 1-2 tabs at bedtime   ramipril  (ALTACE ) 5 MG capsule TAKE 1 CAPSULE BY MOUTH ONCE DAILY FOR  BLOOD  PRESSURE  AND  DIABETIC  KIDNEY  PROTECTION   rosuvastatin  (CRESTOR ) 20 MG tablet TAKE 1 TABLET BY MOUTH THREE TIMES A WEEK FOR CHOLESTEROL   [DISCONTINUED] aspirin  EC 81 MG tablet Take 1 tablet (81 mg total) by mouth daily.   [DISCONTINUED] glucose blood test strip Test blood sugars once daily.Dx: E11.9   promethazine -dextromethorphan (PROMETHAZINE -DM) 6.25-15 MG/5ML syrup Take 5 mLs by mouth 4 (four) times daily as needed for cough. (Patient not taking: Reported on 04/22/2024)   Facility-Administered Medications Prior to Visit  Medication Dose Route Frequency Provider   regadenoson  (LEXISCAN ) injection SOLN 0.4 mg  0.4 mg Intravenous Once Crenshaw, Brian S, MD   technetium tetrofosmin  (TC-MYOVIEW ) injection 32.3 millicurie  32.3 millicurie Intravenous Once PRN Pietro Redell RAMAN, MD    ROS  Per HPI     Objective:     BP 120/68   Pulse 69   Temp (!) 97.5 F (36.4 C) (Oral)   Resp 16   Ht 5' 8 (1.727 m)   Wt 196 lb 12.8 oz (89.3 kg)   SpO2 95%   BMI 29.92 kg/m  BP Readings from Last 3 Encounters:  04/22/24 120/68  01/09/24 132/80  10/12/23 130/71   Wt Readings from Last 3 Encounters:  04/22/24 196 lb 12.8 oz (89.3 kg)  01/09/24 195 lb 11.2 oz (88.8 kg)  10/12/23 184 lb 11.2 oz (83.8 kg)      Physical Exam Constitutional:      General: He is not in acute distress.    Appearance: Normal appearance. He is not ill-appearing or diaphoretic.  HENT:     Head: Normocephalic and atraumatic.     Right Ear: Ear canal and external ear normal. There is impacted cerumen.     Left Ear: Ear canal and external ear normal. There is impacted cerumen.     Nose: Nose normal.     Mouth/Throat:     Mouth: Mucous membranes are moist.     Pharynx: Oropharynx is clear.  Eyes:  General: No scleral icterus.       Right eye: No discharge.         Left eye: No discharge.     Extraocular Movements: Extraocular movements intact.     Conjunctiva/sclera: Conjunctivae normal.     Pupils: Pupils are equal, round, and reactive to light.  Neck:     Thyroid : No thyroid  mass, thyromegaly or thyroid  tenderness.     Vascular: No carotid bruit.  Cardiovascular:     Rate and Rhythm: Normal rate and regular rhythm.     Pulses: Normal pulses.     Heart sounds: Normal heart sounds. No murmur heard.    No friction rub. No gallop.  Pulmonary:     Effort: Pulmonary effort is normal.     Breath sounds: Normal breath sounds. No wheezing, rhonchi or rales.  Chest:     Chest wall: No tenderness.  Abdominal:     General: Bowel sounds are normal. There is distension.     Palpations: Abdomen is soft.     Tenderness: There is no abdominal tenderness. There is no guarding or rebound.     Hernia: No hernia is present.  Musculoskeletal:        General: No swelling, deformity or signs of injury.     Cervical back: Neck supple.     Right lower leg: No edema.     Left lower leg: No edema.     Right foot: No deformity or Charcot foot.     Left foot: No deformity or Charcot foot.  Feet:     Right foot:     Protective Sensation: 9 sites tested.  9 sites sensed.     Skin integrity: Skin integrity normal. No ulcer, skin breakdown, erythema or warmth.     Left foot:     Protective Sensation: 9 sites tested.  9 sites sensed.     Skin integrity: Skin integrity normal. No ulcer, skin breakdown, erythema or warmth.     Toenail Condition: Left toenails are normal.     Comments: Normal pulses Normal except R 2nd toe with mild black linear area of black on his nail, looked like something stuck in his nail, not all the way to the nail matrix. Recommended seeing podiatry. Lymphadenopathy:     Cervical:     Right cervical: No superficial or posterior cervical adenopathy.    Left cervical: No superficial cervical adenopathy.  Skin:    Coloration: Skin is not  jaundiced.     Findings: No rash.  Neurological:     General: No focal deficit present.     Mental Status: He is alert and oriented to person, place, and time.     Motor: No weakness.  Psychiatric:        Behavior: Behavior normal.      No results found for any visits on 04/22/24. Last CBC Lab Results  Component Value Date   WBC 9.4 01/16/2024   HGB 14.3 01/16/2024   HCT 43.2 01/16/2024   MCV 96 01/16/2024   MCH 31.7 01/16/2024   RDW 12.1 01/16/2024   PLT 171 01/16/2024   Last metabolic panel Lab Results  Component Value Date   GLUCOSE 163 (H) 01/16/2024   NA 139 01/16/2024   K 4.2 01/16/2024   CL 104 01/16/2024   CO2 20 01/16/2024   BUN 7 (L) 01/16/2024   CREATININE 0.82 01/16/2024   EGFR 93 01/16/2024   CALCIUM  8.9 01/16/2024   PROT 6.2 01/16/2024   ALBUMIN  4.1 01/16/2024   LABGLOB 2.1 01/16/2024   BILITOT 0.4 01/16/2024   ALKPHOS 123 (H) 01/16/2024   AST 17 01/16/2024   ALT 29 01/16/2024   ANIONGAP 11 10/02/2023   Last lipids Lab Results  Component Value Date   CHOL 102 01/16/2024   HDL 49 01/16/2024   LDLCALC 40 01/16/2024   TRIG 55 01/16/2024   CHOLHDL 2.1 01/16/2024   Last hemoglobin A1c Lab Results  Component Value Date   HGBA1C 7.9 (H) 01/16/2024        Assessment & Plan:    Routine Health Maintenance and Physical Exam  Health Maintenance  Topic Date Due   Zoster (Shingles) Vaccine (1 of 2) 08/21/1970   Medicare Annual Wellness Visit  01/17/2024   COVID-19 Vaccine (4 - 2025-26 season) 04/14/2024   Colon Cancer Screening  04/19/2024   Hemoglobin A1C  07/17/2024   Yearly kidney health urinalysis for diabetes  07/26/2024   Eye exam for diabetics  09/17/2024   Yearly kidney function blood test for diabetes  01/15/2025   Complete foot exam   04/22/2025   DTaP/Tdap/Td vaccine (3 - Td or Tdap) 01/02/2028   Pneumococcal Vaccine for age over 23  Completed   Hepatitis C Screening  Completed   HPV Vaccine  Aged Out   Meningitis B Vaccine   Aged Out   Screening for Lung Cancer  Discontinued   Flu Shot  Discontinued    Encouraged him to engage in regular exercise appropriate for his age and condition.  Assessment and Plan    Adult Wellness Visit Routine adult wellness visit with no acute issues. Blood pressure is well-controlled. No chest pain or shortness of breath. No recent physical exam this year. - Order baseline labs including A1c, cholesterol, blood count, kidney function, liver function, and electrolytes - Refer for colonoscopy at [facility] with Dr. 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 Current medication includes metformin  2000 mg per day and [name] . Last A1c was 7.9%. He expresses concern about metformin  due to anecdotal reports of kidney issues. Metformin  is considered safe and is the most studied type 2 diabetes medication. - Continue metformin  2000 mg per day - Continue [name]  - Order A1c every 3 months - Discuss diet and exercise modifications to improve glycemic control - Send prescription for a new blood glucose meter  Atherosclerotic heart disease of native coronary artery without angina/hyperlipidemia Atherosclerotic heart disease without current angina. Last cardiology visit was in February 2025. - Provide cardiology contact information for follow-up appointment  Essential hypertension Chronic, stable.  Hypertension is well-controlled.  Continue current regimen.  Alcohol use, current Current alcohol use of 2-3 beers per day. Discussed the impact of alcohol on health, especially with aging. -  Recommend limiting alcohol intake to 14 drinks per week      Return in about 3 months (around 07/22/2024) for DM.   Thia Olesen T Jacques Fife, PA-C

## 2024-04-23 LAB — LIPID PANEL
Chol/HDL Ratio: 2.4 ratio (ref 0.0–5.0)
Cholesterol, Total: 100 mg/dL (ref 100–199)
HDL: 42 mg/dL (ref >39)
LDL Chol Calc (NIH): 42 mg/dL (ref 0–99)
Triglycerides: 77 mg/dL (ref 0–149)
VLDL Cholesterol Cal: 16 mg/dL (ref 5–40)

## 2024-04-23 LAB — CBC WITH DIFFERENTIAL/PLATELET
Basophils Absolute: 0 x10E3/uL (ref 0.0–0.2)
Basos: 1 %
EOS (ABSOLUTE): 0.3 x10E3/uL (ref 0.0–0.4)
Eos: 4 %
Hematocrit: 45.4 % (ref 37.5–51.0)
Hemoglobin: 15.1 g/dL (ref 13.0–17.7)
Immature Grans (Abs): 0 x10E3/uL (ref 0.0–0.1)
Immature Granulocytes: 0 %
Lymphocytes Absolute: 1.8 x10E3/uL (ref 0.7–3.1)
Lymphs: 23 %
MCH: 31.5 pg (ref 26.6–33.0)
MCHC: 33.3 g/dL (ref 31.5–35.7)
MCV: 95 fL (ref 79–97)
Monocytes Absolute: 0.6 x10E3/uL (ref 0.1–0.9)
Monocytes: 8 %
Neutrophils Absolute: 5.1 x10E3/uL (ref 1.4–7.0)
Neutrophils: 64 %
Platelets: 162 x10E3/uL (ref 150–450)
RBC: 4.8 x10E6/uL (ref 4.14–5.80)
RDW: 12.4 % (ref 11.6–15.4)
WBC: 7.9 x10E3/uL (ref 3.4–10.8)

## 2024-04-23 LAB — HEMOGLOBIN A1C
Est. average glucose Bld gHb Est-mCnc: 177 mg/dL
Hgb A1c MFr Bld: 7.8 % — ABNORMAL HIGH (ref 4.8–5.6)

## 2024-04-23 LAB — COMPREHENSIVE METABOLIC PANEL WITH GFR
ALT: 20 IU/L (ref 0–44)
AST: 12 IU/L (ref 0–40)
Albumin: 4.4 g/dL (ref 3.8–4.8)
Alkaline Phosphatase: 122 IU/L — ABNORMAL HIGH (ref 44–121)
BUN/Creatinine Ratio: 11 (ref 10–24)
BUN: 8 mg/dL (ref 8–27)
Bilirubin Total: 0.5 mg/dL (ref 0.0–1.2)
CO2: 22 mmol/L (ref 20–29)
Calcium: 9.3 mg/dL (ref 8.6–10.2)
Chloride: 101 mmol/L (ref 96–106)
Creatinine, Ser: 0.74 mg/dL — ABNORMAL LOW (ref 0.76–1.27)
Globulin, Total: 2.2 g/dL (ref 1.5–4.5)
Glucose: 108 mg/dL — ABNORMAL HIGH (ref 70–99)
Potassium: 4.1 mmol/L (ref 3.5–5.2)
Sodium: 137 mmol/L (ref 134–144)
Total Protein: 6.6 g/dL (ref 6.0–8.5)
eGFR: 96 mL/min/1.73 (ref >59)

## 2024-04-25 ENCOUNTER — Ambulatory Visit (HOSPITAL_BASED_OUTPATIENT_CLINIC_OR_DEPARTMENT_OTHER): Payer: Self-pay | Admitting: Student

## 2024-04-30 ENCOUNTER — Ambulatory Visit: Admitting: Pulmonary Disease

## 2024-04-30 ENCOUNTER — Encounter: Payer: Self-pay | Admitting: Pulmonary Disease

## 2024-04-30 VITALS — BP 122/60 | HR 76 | Temp 98.0°F | Ht 70.0 in | Wt 199.0 lb

## 2024-04-30 DIAGNOSIS — J849 Interstitial pulmonary disease, unspecified: Secondary | ICD-10-CM

## 2024-04-30 DIAGNOSIS — J439 Emphysema, unspecified: Secondary | ICD-10-CM | POA: Diagnosis not present

## 2024-04-30 DIAGNOSIS — F17210 Nicotine dependence, cigarettes, uncomplicated: Secondary | ICD-10-CM

## 2024-04-30 DIAGNOSIS — G4733 Obstructive sleep apnea (adult) (pediatric): Secondary | ICD-10-CM | POA: Diagnosis not present

## 2024-04-30 DIAGNOSIS — J449 Chronic obstructive pulmonary disease, unspecified: Secondary | ICD-10-CM

## 2024-04-30 MED ORDER — NICOTINE 14 MG/24HR TD PT24: 14.0000 mg | MEDICATED_PATCH | Freq: Every day | TRANSDERMAL | 0 refills | Status: DC

## 2024-04-30 MED ORDER — VARENICLINE TARTRATE (STARTER) 0.5 MG X 11 & 1 MG X 42 PO TBPK: 1.0000 | ORAL_TABLET | Freq: Every day | ORAL | 2 refills | Status: DC

## 2024-04-30 NOTE — Progress Notes (Signed)
 Joe Green    [id number]    Jan 22, 1951  Primary Care Physician:[name], Lang DASEN, PA-C  Referring Physician: Iven Lang DASEN, PA-C [address] [place],  [place] [address]  Chief complaint: Follow-up for abnormal CT, ILD evaluation  HPI: 73 y.o.  with history of diabetes, sleep apnea referred for evaluation of abnormal CT with concern for ILD He has chronic cough with mild dyspnea on exertion.  Discussed at ILD conference 05/25/11.  CT findings are felt to be alternate pattern due to predominant nature of groundglass opacities over fibrotic changes and no clear honeycombing.  Changes at the lung base felt to be more consistent with bullous emphysema than honeycombing  Referral was made to cardiothoracic surgery for lung biopsy but after meeting with Dr. Kerrin he changed mind and decided against lung biopsy.  He wants to continue monitoring  Interim history: Discussed the use of AI scribe software for clinical note transcription with the patient, who gave verbal consent to proceed.  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  Combined pulmonary fibrosis and emphysema (cpfe) - Breathing is stable. - Follow-up for ongoing management of CPFE.  Tobacco use - Current smoker, 'dabbling' in smoking. - History of smoking. - Previously prescribed nicotine  patches and Chantix , but has not used them consistently. - Has some nicotine  patches remaining.  Obstructive sleep apnea - Uses CPAP machine. - Recently resumed CPAP use after a period of discontinuation. - Uncertain of current CPAP settings. - No follow-up with sleep specialist since sleep study in 2023.   Relevant pulmonary history Pets: No pets Occupation: Retired Energy manager ILD questionnaire for exposures 04/11/21-incompletely filled.  Denies any  exposures Smoking history: 40-pack-year smoker.  Continues to smoke half pack per day Travel history: No significant travel history Relevant family history: No family history of lung disease  Outpatient Encounter Medications as of 04/30/2024  Medication Sig   acetaminophen  (TYLENOL ) 500 MG tablet Take 2 tablets (1,000 mg total) by mouth every 6 (six) hours.   albuterol  (VENTOLIN  HFA) 108 (90 Base) MCG/ACT inhaler Inhale 2 puffs into the lungs every 6 (six) hours as needed for wheezing or shortness of breath.   Blood Glucose Monitoring Suppl DEVI 1 each by Does not apply route in the morning, at noon, and at bedtime. May substitute to any manufacturer covered by patient's insurance.   Cinnamon 500 MG TABS Take 500 mg by mouth daily.   clopidogrel  (PLAVIX ) 75 MG tablet Take 1 tablet (75 mg total) by mouth daily.   doxazosin  (CARDURA ) 8 MG tablet Take 1 tablet by mouth at Bedtime for BP & Prostate.   Dulaglutide  ([name] ) 3 MG/0.5ML SOPN Inject 3 mg as directed once a week.   fexofenadine (ALLEGRA) 60 MG tablet Take 60 mg by mouth daily as needed for allergies or rhinitis.   finasteride  (PROSCAR ) 5 MG tablet TAKE 1 TABLET BY MOUTH ONCE DAILY FOR PROSTATE   fluticasone  furoate-vilanterol (BREO ELLIPTA ) 100-25 MCG/ACT AEPB Use 1 inhalation Daily for COPD / Asthma   Glucose Blood (BLOOD GLUCOSE TEST STRIPS) STRP 1 each by In Vitro route in the morning, at noon, and at bedtime. May substitute to any manufacturer covered by patient's insurance.   Lancet Device MISC 1 each by Does not apply route in the morning, at noon, and at bedtime. May substitute to any manufacturer covered  by patient's insurance.   Lancets Misc. MISC 1 each by Does not apply route in the morning, at noon, and at bedtime. May substitute to any manufacturer covered by patient's insurance.   metFORMIN  (GLUCOPHAGE -XR) 500 MG 24 hr tablet On week 1 take 2 tablets daily with breakfast (1,000 mg daily total), THEN on week 2 take 2 tablets  with breakfast and 1 tablet with dinner (1,500 mg daily total), THEN on week 3 take 2 tablets daily with breakfast and 2 tablets daily with dinner (2,000 mg daily total). You will continue 4 tablets daily thereafter.   nitroGLYCERIN  (NITROSTAT ) 0.4 MG SL tablet Place 1 tablet (0.4 mg total) under the tongue every 5 (five) minutes as needed for chest pain.   ramipril  (ALTACE ) 5 MG capsule TAKE 1 CAPSULE BY MOUTH ONCE DAILY FOR  BLOOD  PRESSURE  AND  DIABETIC  KIDNEY  PROTECTION   rosuvastatin  (CRESTOR ) 20 MG tablet TAKE 1 TABLET BY MOUTH THREE TIMES A WEEK FOR CHOLESTEROL   glimepiride  (AMARYL ) 4 MG tablet TAKE 1/2 TO 1 (ONE-HALF TO ONE) TABLET BY MOUTH ONCE DAILY WITH SUPPER FOR DIABETES   pramipexole  (MIRAPEX ) 1 MG tablet 1-2 tabs at bedtime   promethazine -dextromethorphan (PROMETHAZINE -DM) 6.25-15 MG/5ML syrup Take 5 mLs by mouth 4 (four) times daily as needed for cough. (Patient not taking: Reported on 04/30/2024)   Facility-Administered Encounter Medications as of 04/30/2024  Medication   regadenoson  (LEXISCAN ) injection SOLN 0.4 mg   technetium tetrofosmin  (TC-MYOVIEW ) injection 32.3 millicurie    Physical Exam GEN: No acute distress CV: Regular rate and rhythm no murmurs LUNGS: Clear to auscultation bilaterally normal respiratory effort SKIN JOINTS: Warm and dry no rash    Data Reviewed: Imaging: Screening CT chest 12/15/2016-centrilobular and paraseptal emphysema.  Minimal subpleural reticulation. Screening CT chest 02/18/2021-centrilobular and paraseptal emphysema, bibasal subpleural reticulation and groundglass with honeycombing.  High-resolution CT 05/04/2021-peripheral and basilar predominant groundglass with minimal subpleural reticulation, alternate diagnosis. High resolution CT 05/25/2022-stable interstitial lung disease  I have reviewed the images personally.  PFTs: 05/20/2021 FVC 4.24 [96%], FEV1 3.05 [94%], F/F 72, TLC 6.77 [97%], DLCO 19.86 [77%]  07/18/22 FVC 4.16 [95%],  FEV1 2.92 [91%], F/F90, TLC 6.77 [97%], DLCO 21.44 [83%] Normal test  Labs: CTD serologies 03/22/21-significant only for rheumatoid factor of 18  Sleep: PSG 07/02/2022 Sleep disordered breathing was present but delayed sleep onset made SPLIT protocol impossible to implement.  This is still considered mild Obstructive Sleep apnea at an AHI of 17/h but the severity is clearly associated with supine sleep exacerbation and hypoxia/ orthopnoea.  Assessment & Plan Combined pulmonary fibrosis and emphysema (CPFE) CT scan reviewed at ILD conference and findings is consistent with alternate diagnosis as there is predominant groundglass with no honeycombing.  See discussion above  We discussed options including biopsy with either bronchoscopy with ENVISIA or surgical lung biopsy, watchful waiting or empiric treatment with antifibrotics.  Since his breathing is doing well he would like to avoid any intervention at present and prefers conservative management with monitoring - Order chest CT - Order lung function test  Tobacco use disorder Continues occasional smoking despite previous cessation discussions. Previously prescribed nicotine  patches and Chantix . Concerned about Chantix  side effects, including insomnia, vivid dreams, and mood changes, but no history of mood disorders or depression. Informed that combining Chantix  and nicotine  patches increases cessation success.  Time spent counseling-5 minutes.  Reassess at return visit - Prescribe nicotine  patches - Prescribe Chantix   Obstructive sleep apnea Using CPAP machine with uncertain  settings. No recent follow-up with sleep specialist.  Previously seen by Dr. Chalice and was told to follow-up with pulmonary.  Previous sleep study in 2023.  - Attempt to get CPAP download.  If unable then consider repeating sleep study    Plan/Recommendations: Conservative management, repeat CT, PFTs Chantix  and nicotine  patches for smoking cessation Get CPAP  download  Lonna Coder MD [facility] 04/30/2024, 1:45 PM  CC: [name], Lang DASEN, PA-C

## 2024-04-30 NOTE — Patient Instructions (Signed)
  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 We discussed your current symptoms, smoking habits, and CPAP machine usage.  YOUR PLAN: COMBINED PULMONARY FIBROSIS AND EMPHYSEMA (CPFE): Your breathing is stable, and there is no current need for a biopsy or bronchoscopy. -We will order a chest CT scan. -We will also order a lung function test.  TOBACCO USE DISORDER: You continue to smoke occasionally and have concerns about the side effects of Chantix . -We will prescribe nicotine  patches. -We will prescribe Chantix . Combining these with nicotine  patches can increase your chances of quitting smoking.  OBSTRUCTIVE SLEEP APNEA: You have resumed using your CPAP machine but are unsure of the current settings and have not had a recent follow-up with a sleep specialist.                               Contains text generated by [employer].

## 2024-05-06 ENCOUNTER — Encounter: Payer: Self-pay | Admitting: Pulmonary Disease

## 2024-05-07 ENCOUNTER — Other Ambulatory Visit: Payer: Self-pay | Admitting: Nurse Practitioner

## 2024-05-07 DIAGNOSIS — E1159 Type 2 diabetes mellitus with other circulatory complications: Secondary | ICD-10-CM

## 2024-05-07 DIAGNOSIS — G2581 Restless legs syndrome: Secondary | ICD-10-CM

## 2024-05-08 ENCOUNTER — Other Ambulatory Visit: Payer: Self-pay | Admitting: Nurse Practitioner

## 2024-05-08 DIAGNOSIS — G2581 Restless legs syndrome: Secondary | ICD-10-CM

## 2024-05-09 ENCOUNTER — Ambulatory Visit
Admission: RE | Admit: 2024-05-09 | Discharge: 2024-05-09 | Disposition: A | Discharge status: Home / Self Care | Source: Ambulatory Visit | Attending: Pulmonary Disease | Admitting: Pulmonary Disease

## 2024-05-09 ENCOUNTER — Other Ambulatory Visit: Payer: Self-pay | Admitting: Nurse Practitioner

## 2024-05-09 DIAGNOSIS — J432 Centrilobular emphysema: Secondary | ICD-10-CM | POA: Diagnosis not present

## 2024-05-09 DIAGNOSIS — J849 Interstitial pulmonary disease, unspecified: Secondary | ICD-10-CM

## 2024-05-09 DIAGNOSIS — E1159 Type 2 diabetes mellitus with other circulatory complications: Secondary | ICD-10-CM

## 2024-05-09 DIAGNOSIS — G2581 Restless legs syndrome: Secondary | ICD-10-CM

## 2024-05-12 NOTE — Progress Notes (Signed)
 JERUSALEM WERT                                          MRN: [id number]   05/12/2024   The [facility] Quality Team Specialist reviewed this patient medical record for the purposes of chart review for care gap closure. The following were reviewed: chart review for care gap closure-kidney health evaluation for diabetes:eGFR  and uACR.    VBCI Quality Team

## 2024-05-13 ENCOUNTER — Other Ambulatory Visit (HOSPITAL_BASED_OUTPATIENT_CLINIC_OR_DEPARTMENT_OTHER): Payer: Self-pay

## 2024-05-13 DIAGNOSIS — E1159 Type 2 diabetes mellitus with other circulatory complications: Secondary | ICD-10-CM

## 2024-05-13 DIAGNOSIS — G2581 Restless legs syndrome: Secondary | ICD-10-CM

## 2024-05-13 MED ORDER — GLIMEPIRIDE 4 MG PO TABS: ORAL_TABLET | ORAL | 2 refills | Status: AC

## 2024-05-13 MED ORDER — PRAMIPEXOLE DIHYDROCHLORIDE 1 MG PO TABS: ORAL_TABLET | ORAL | 2 refills | Status: AC

## 2024-05-13 NOTE — Telephone Encounter (Signed)
 Source  Joe Green, Joe Green (Patient)   Subject  Joe Green (Patient)   Topic  Clinical - Prescription Issue    Communication  Reason for CRM: Received call from patient, stets he is following up from a call from yesterday, 05/12/24, regarding denial of medications.        Per patient needs medication as soon as possible, as he is not sleeping, and the other for diabetes.        Medications:    glimepiride  (AMARYL ) 4 MG tablet        pramipexole  (MIRAPEX ) 1 MG tablet        Last appt: 04/22/24    Next appt: 07/22/24        Patient is requesting a follow up call as to why medications were denied. Can be reached at [phone].            Patient is aware of same day call back.            [facility] [address] [place], [place] - [address]    [address] [facility] [place] [address]    Phone: [phone] Fax: [phone]            Called and spoke to office, no clinical staff available at time of call, advised to send CRM to office clinical pool.

## 2024-05-15 ENCOUNTER — Telehealth (HOSPITAL_BASED_OUTPATIENT_CLINIC_OR_DEPARTMENT_OTHER): Payer: Self-pay

## 2024-05-15 NOTE — Telephone Encounter (Signed)
 Pt advised that glimepiride  was sent in. I also told him I had been trying to reach him regarding his labs. He said he does not want to increase trulicity  right now. He wants to work on his sugars on his own and his current mends.

## 2024-05-16 ENCOUNTER — Telehealth: Payer: Self-pay | Admitting: Pulmonary Disease

## 2024-05-16 DIAGNOSIS — R911 Solitary pulmonary nodule: Secondary | ICD-10-CM

## 2024-05-16 DIAGNOSIS — J849 Interstitial pulmonary disease, unspecified: Secondary | ICD-10-CM

## 2024-05-16 NOTE — Telephone Encounter (Signed)
 Reviewed CT scan results resulted on 05/12/2024 Shows stable emphysema, interstitial lung disease Increase in subpleural nodule in the lingula measuring 1.8 cm with enlargement of left hilar and AP window lymph node.  Will order PET/CT and discuss with lung nodule team on best approach to biopsy. Called and discussed in detail with patient.  Melony Tenpas MD [facility] 05/16/2024, 3:10 PM

## 2024-05-19 NOTE — Telephone Encounter (Signed)
 We reviewed - will decide on bronchoscopy after we review the PET results.

## 2024-05-26 ENCOUNTER — Encounter (HOSPITAL_COMMUNITY)
Admission: RE | Admit: 2024-05-26 | Discharge: 2024-05-26 | Disposition: A | Discharge status: Home / Self Care | Source: Ambulatory Visit | Attending: Pulmonary Disease | Admitting: Pulmonary Disease

## 2024-05-26 DIAGNOSIS — R911 Solitary pulmonary nodule: Secondary | ICD-10-CM | POA: Insufficient documentation

## 2024-05-26 DIAGNOSIS — R918 Other nonspecific abnormal finding of lung field: Secondary | ICD-10-CM | POA: Diagnosis not present

## 2024-05-26 LAB — GLUCOSE, CAPILLARY: Glucose-Capillary: 168 mg/dL — ABNORMAL HIGH (ref 70–99)

## 2024-05-26 MED ORDER — FLUDEOXYGLUCOSE F - 18 (FDG) INJECTION
10.3900 | Freq: Once | INTRAVENOUS | Status: AC | PRN
  Administered 2024-05-26: 10.39 via INTRAVENOUS

## 2024-05-28 ENCOUNTER — Telehealth: Payer: Self-pay | Admitting: Pulmonary Disease

## 2024-05-28 DIAGNOSIS — R911 Solitary pulmonary nodule: Secondary | ICD-10-CM | POA: Insufficient documentation

## 2024-05-28 NOTE — Telephone Encounter (Signed)
 Please schedule the following:  Provider performing procedure:Dr Lamar Chris Diagnosis: Lung nodule Which side if for nodule / mass? Left Procedure: Bronchoscopy with EBUS, navigational biopsy Has patient been spoken to by Provider and given informed consent?  Yes Anesthesia: General Do you need Fluro?  Yes Duration of procedure:  Date: 06/09/2024 Alternate Date: 06/10/2024 Time: AM/ PM Location: [facility] Does patient have OSA?  No DM?  No or Latex allergy?  No Medication Restriction/ Anticoagulate/Antiplatelet: Hold Plavix  5 days before procedure.  If patient is on aspirin  hold 2 days before procedure Pre-op Labs Ordered:determined by Anesthesia Imaging request: None (If, SuperDimension CT Chest, please have STAT courier sent to ENDO)  Lonna Coder MD [facility] 05/28/2024, 2:33 PM

## 2024-05-29 ENCOUNTER — Encounter: Payer: Self-pay | Admitting: Emergency Medicine

## 2024-05-29 ENCOUNTER — Other Ambulatory Visit: Payer: Self-pay | Admitting: Pulmonary Disease

## 2024-05-29 DIAGNOSIS — R911 Solitary pulmonary nodule: Secondary | ICD-10-CM

## 2024-05-29 NOTE — Progress Notes (Deleted)
 05/29/2024 Joe Green [id number] Oct 10, 1950  Referring provider: Iven Lang DASEN, PA-C Primary GI doctor: Dr. Federico  ASSESSMENT AND PLAN:  Hepatic steatosis 08/03/2023 RUQ US  steatosis seen  Cholelithiasis Status post laparoscopic cholecystectomy 10/09/2023  Personal history of colon polyps 04/19/2021 colonoscopy Dr. Aneita 4 tubular adenomas and 2 benign. Moderate diverticulosis in the left colon. Internal hemorrhoids  Recall colonoscopy 04/2024 with 2-day prep  CAD status post PCI to RCA 2006 11/2022 nuclear stress test low risk IV LV perfusion normal EF 57%  TIA on Plavix   COPD/interstitial lung disease/OSA recent anterior lingual nodule in mediastinal lymphadenopathy on PET scan 05/26/2024 06/09/2024 plan bronchoscopy with Dr. Shelah Follows with Dr. Theophilus last seen 04/30/2024  Type 2 diabetes On [name]   Patient Care Team: [name], [name] as PCP - General (Physician Assistant) Delford Maude BROCKS, MD as PCP - Cardiology (Cardiology) Delford Maude BROCKS, MD as Consulting Physician (Cardiology) Aneita Gwendlyn DASEN, MD (Inactive) as Consulting Physician (Gastroenterology) [name], [facility] (Inactive) as Pharmacist (Pharmacist)  HISTORY OF PRESENT ILLNESS: 73 y.o. male with a past medical history of hypertension, kidney stones, COPD, type 2 diabetes, BPH, vitamin D  deficiency, tobacco use, hyperlipidemia, CAD s/p PCI to RCA (2006),chronic RBBB, chronic kidney disease stage II  and others listed below presents for evaluation of colonoscopy on Plavix .   Patient last seen in the office 08/20/2023 by Cathryne May, NP for right upper quadrant pain and cholelithiasis.  *** Discussed the use of AI scribe software for clinical note transcription with the patient, who gave verbal consent to proceed.  History of Present Illness            He  reports that he has been smoking cigarettes. He started smoking about 55 years ago. He has a 13.9 pack-year smoking history. He  has been exposed to tobacco smoke. He has never used smokeless tobacco. He reports that he does not currently use alcohol. He reports that he does not use drugs.  RELEVANT GI HISTORY, IMAGING AND LABS: Results         08/03/2023 US  Abd limited RUQ  IMPRESSION: 1. Cholelithiasis without secondary signs of acute cholecystitis. 2. Mild increased hepatic parenchymal echogenicity suggestive of steatosis.   06/09/2021 CT Abdomen pelvis without contrast IMPRESSION: 1. No acute process identified.  No hernia visualized. 2. Cholelithiasis. 3. Right renal calculus. 4. Mild colonic diverticulosis. 5. Marked prostatomegaly.   04/19/21 colonoscopy with Dr. Aneita Impression:  - Five 5 to 8 mm polyps in the rectum, in the descending colon and in the transverse colon, removed with a cold snare. Resected and retrieved. - Moderate diverticulosis in the left colon.  - Internal hemorrhoids.  - The examination was otherwise normal on direct and retroflexion views. Path: Diagnosis Surgical [P], transverse, descending, rectal, polyps (5) - TUBULAR ADENOMA (4 OF 6 FRAGMENTS) - BENIGN COLONIC MUCOSA (2 OF 6 FRAGMENTS) - NO HIGH-GRADE DYSPLASIA OR MALIGNANCY IDENTIFIED   11/03/2011 colonoscopy with Dr. Aneita Impression: 7 mm sessile polyp in the ascending colon 4 mm sessile polyp in the sigmoid colon Moderate diverticulosis in the sigmoid to descending colon Internal hemorrhoids Path:  Diagnosis Surgical [P], descending and sigmoid colon, polyps - TUBULAR ADENOMA (X1); NEGATIVE FOR HIGH GRADE DYSPLASIA OR MALIGNANCY. - HYPERPLASTIC POLYP (X1) CBC    Component Value Date/Time   WBC 7.9 04/22/2024 1356   WBC 7.9 10/02/2023 1021   RBC 4.80 04/22/2024 1356   RBC 4.40 10/02/2023 1021   HGB 15.1 04/22/2024 1356   HCT  45.4 04/22/2024 1356   PLT 162 04/22/2024 1356   MCV 95 04/22/2024 1356   MCH 31.5 04/22/2024 1356   MCH 30.9 10/02/2023 1021   MCHC 33.3 04/22/2024 1356   MCHC 32.6 10/02/2023  1021   RDW 12.4 04/22/2024 1356   LYMPHSABS 1.8 04/22/2024 1356   MONOABS 1.1 (H) 10/31/2022 1018   EOSABS 0.3 04/22/2024 1356   BASOSABS 0.0 04/22/2024 1356   Recent Labs    07/27/23 1000 08/30/23 1404 10/02/23 1021 01/16/24 0822 04/22/24 1356  HGB 14.1 13.1* 13.6 14.3 15.1    CMP     Component Value Date/Time   NA 137 04/22/2024 1356   K 4.1 04/22/2024 1356   CL 101 04/22/2024 1356   CO2 22 04/22/2024 1356   GLUCOSE 108 (H) 04/22/2024 1356   GLUCOSE 136 (H) 10/02/2023 1021   BUN 8 04/22/2024 1356   CREATININE 0.74 (L) 04/22/2024 1356   CREATININE 0.79 08/30/2023 1404   CALCIUM  9.3 04/22/2024 1356   PROT 6.6 04/22/2024 1356   ALBUMIN 4.4 04/22/2024 1356   AST 12 04/22/2024 1356   ALT 20 04/22/2024 1356   ALKPHOS 122 (H) 04/22/2024 1356   BILITOT 0.5 04/22/2024 1356   GFRNONAA >60 10/02/2023 1021   GFRNONAA 97 10/28/2020 1017   GFRAA 113 10/28/2020 1017      Latest Ref Rng & Units 04/22/2024    1:56 PM 01/16/2024    8:22 AM 08/30/2023    2:04 PM  Hepatic Function  Total Protein 6.0 - 8.5 g/dL 6.6  6.2  6.5   Albumin 3.8 - 4.8 g/dL 4.4  4.1    AST 0 - 40 IU/L 12  17  12    ALT 0 - 44 IU/L 20  29  17    Alk Phosphatase 44 - 121 IU/L 122  123    Total Bilirubin 0.0 - 1.2 mg/dL 0.5  0.4  0.5       Current Medications:   Current Outpatient Medications (Endocrine & Metabolic):    Dulaglutide  ([name] ) 3 MG/0.5ML SOPN, Inject 3 mg as directed once a week.   glimepiride  (AMARYL ) 4 MG tablet, TAKE 1/2 TO 1 (ONE-HALF TO ONE) TABLET BY MOUTH ONCE DAILY WITH SUPPER FOR DIABETES   metFORMIN  (GLUCOPHAGE -XR) 500 MG 24 hr tablet, On week 1 take 2 tablets daily with breakfast (1,000 mg daily total), THEN on week 2 take 2 tablets with breakfast and 1 tablet with dinner (1,500 mg daily total), THEN on week 3 take 2 tablets daily with breakfast and 2 tablets daily with dinner (2,000 mg daily total). You will continue 4 tablets daily thereafter.   Current Outpatient Medications  (Cardiovascular):    doxazosin  (CARDURA ) 8 MG tablet, Take 1 tablet by mouth at Bedtime for BP & Prostate.   nitroGLYCERIN  (NITROSTAT ) 0.4 MG SL tablet, Place 1 tablet (0.4 mg total) under the tongue every 5 (five) minutes as needed for chest pain.   ramipril  (ALTACE ) 5 MG capsule, TAKE 1 CAPSULE BY MOUTH ONCE DAILY FOR  BLOOD  PRESSURE  AND  DIABETIC  KIDNEY  PROTECTION   rosuvastatin  (CRESTOR ) 20 MG tablet, TAKE 1 TABLET BY MOUTH THREE TIMES A WEEK FOR CHOLESTEROL   Current Outpatient Medications (Respiratory):    albuterol  (VENTOLIN  HFA) 108 (90 Base) MCG/ACT inhaler, Inhale 2 puffs into the lungs every 6 (six) hours as needed for wheezing or shortness of breath.   fexofenadine (ALLEGRA) 60 MG tablet, Take 60 mg by mouth daily as needed for allergies or rhinitis.  fluticasone  furoate-vilanterol (BREO ELLIPTA ) 100-25 MCG/ACT AEPB, Use 1 inhalation Daily for COPD / Asthma   promethazine -dextromethorphan (PROMETHAZINE -DM) 6.25-15 MG/5ML syrup, Take 5 mLs by mouth 4 (four) times daily as needed for cough. (Patient not taking: Reported on 04/30/2024)   Current Outpatient Medications (Analgesics):    acetaminophen  (TYLENOL ) 500 MG tablet, Take 2 tablets (1,000 mg total) by mouth every 6 (six) hours.   Current Outpatient Medications (Hematological):    clopidogrel  (PLAVIX ) 75 MG tablet, Take 1 tablet (75 mg total) by mouth daily.   Current Outpatient Medications (Other):    Blood Glucose Monitoring Suppl DEVI, 1 each by Does not apply route in the morning, at noon, and at bedtime. May substitute to any manufacturer covered by patient's insurance.   Cinnamon 500 MG TABS, Take 500 mg by mouth daily.   finasteride  (PROSCAR ) 5 MG tablet, TAKE 1 TABLET BY MOUTH ONCE DAILY FOR PROSTATE   nicotine  (NICODERM CQ  - DOSED IN MG/24 HOURS) 14 mg/24hr patch, Place 1 patch (14 mg total) onto the skin daily.   pramipexole  (MIRAPEX ) 1 MG tablet, 1-2 tabs at bedtime   Varenicline  Tartrate, Starter, (CHANTIX   STARTING MONTH PAK) 0.5 MG X 11 & 1 MG X 42 TBPK, Take 1 tablet by mouth daily.   Facility-Administered Medications Ordered in Other Visits (Other):    regadenoson  (LEXISCAN ) injection SOLN 0.4 mg   technetium tetrofosmin  (TC-MYOVIEW ) injection 32.3 millicurie No current facility-administered medications for this visit.  Medical History:  Past Medical History:  Diagnosis Date   Aortic atherosclerosis (HCC)by Chest CT scan 11/2016 12/30/2017   Per chest CT 11/2016     Arthritis    B12 deficiency 04/22/2020   BPH (benign prostatic hyperplasia) 10/05/2014   CAD (coronary artery disease)    a. s/p stent to RCA 2005; b. LHC 2006 after Nuc with inf ischemia: patent RCA stent, D1 80%, LAD 60% => med Rx;  c. LHC 04/18/11: Proximal LAD 60% (unchanged from previous catheterization), distal LAD 65% (small caliber), Dx 60%, proximal CFX 20%, mid CFX 50%, OM1 30%, proximal RCA 20%, mid RCA stent patent, EF 65%.=> med Rx   COPD (chronic obstructive pulmonary disease) (HCC) 10/05/2014   Gallstones 02/21/2021   GERD (gastroesophageal reflux disease)    Hepatic steatosis 02/17/2020   Per CT 02/2020     History of colonic polyps 04/23/2020   HLD (hyperlipidemia)    Hyperlipidemia associated with type 2 diabetes mellitus (HCC) 04/03/2011   Hypertension    ILD (interstitial lung disease) (HCC) 02/21/2021   Suggested by CT 02/2021 - pulmonology referral placed     Mini stroke 08/2023   [facility]   Obesity    OSA (obstructive sleep apnea)    has a cpap-does not use-has a special mouthpiece he wears at night   RLS (restless legs syndrome) 10/05/2014   Type 2 diabetes mellitus (HCC) 06/05/2014   Type II or unspecified type diabetes mellitus without mention of complication, not stated as uncontrolled    Vitamin D  deficiency    Wears glasses    Allergies: No Known Allergies   Surgical History:  He  has a past surgical history that includes heart stent (2005); Colonoscopy (11/2011); Polypectomy;  Cardiac catheterization (2005); Inguinal hernia repair (Right, 11/16/2014); Insertion of mesh (Right, 11/16/2014); Knee arthroscopy with medial menisectomy (Bilateral, 06/25/2017); Chondroplasty (06/25/2017); and Cholecystectomy (N/A, 10/09/2023). Family History:  His family history includes Breast cancer in his sister and sister; Cancer in his mother; Hypertension in his father; Kidney cancer in his maternal  grandmother; Prostate cancer in his father.  REVIEW OF SYSTEMS  : All other systems reviewed and negative except where noted in the History of Present Illness.  PHYSICAL EXAM: There were no vitals taken for this visit. Physical Exam          Alan JONELLE Coombs, PA-C 7:51 AM

## 2024-05-29 NOTE — Telephone Encounter (Signed)
 Letter was sent through mychart case #[id number] I will call the patient will send to [facility] to check auth

## 2024-05-30 ENCOUNTER — Ambulatory Visit: Admitting: Physician Assistant

## 2024-05-30 NOTE — Telephone Encounter (Signed)
 Spoke to patient aware of instructions I have sent patient to GI for ct appt before procedure

## 2024-06-04 ENCOUNTER — Encounter (HOSPITAL_BASED_OUTPATIENT_CLINIC_OR_DEPARTMENT_OTHER)

## 2024-06-04 ENCOUNTER — Ambulatory Visit
Admission: RE | Admit: 2024-06-04 | Discharge: 2024-06-04 | Disposition: A | Discharge status: Home / Self Care | Source: Ambulatory Visit | Attending: Pulmonary Disease | Admitting: Pulmonary Disease

## 2024-06-04 DIAGNOSIS — R911 Solitary pulmonary nodule: Secondary | ICD-10-CM

## 2024-06-04 DIAGNOSIS — J432 Centrilobular emphysema: Secondary | ICD-10-CM | POA: Diagnosis not present

## 2024-06-09 ENCOUNTER — Other Ambulatory Visit: Payer: Self-pay

## 2024-06-09 ENCOUNTER — Encounter (HOSPITAL_COMMUNITY): Payer: Self-pay | Admitting: Emergency Medicine

## 2024-06-09 ENCOUNTER — Telehealth: Payer: Self-pay

## 2024-06-09 NOTE — Telephone Encounter (Signed)
 I'm hoping that we can schedule him for 11/10 or 11/11 with either Dr Zaida or Dr Kara. Will review films and make a plan, get him rescheduled.

## 2024-06-09 NOTE — Telephone Encounter (Signed)
 Called and notified pt that bronch for 10/28 is going to have to be scheduled. Routing to [facility]

## 2024-06-09 NOTE — Progress Notes (Signed)
 Anesthesia Chart Review: SAME DAY WORK-UP  Case: [id number] Date/Time: 06/10/24 0930   Procedures:      BRONCHOSCOPY, WITH EBUS (Bilateral)     VIDEO BRONCHOSCOPY WITH ENDOBRONCHIAL NAVIGATION (Left)   Anesthesia type: General   Diagnosis: Lung nodule [R91.1]   Pre-op diagnosis: Lung nodule with lymphadenopathy   [facility] / [facility]   Surgeons: Shelah Lamar RAMAN, MD       DISCUSSION: Patient is a 73 year old male scheduled for the above procedure.  He is followed by pulmonologist Dr. Theophilus for ILD, emphysema and lingular nodule. Recent CT showed increase in subpleural nodule. 05/16/2024 PET scan showed nodule hypermetabolic with associated mediastinal lymphadenopathy highly concerning for primary bronchogenic malignancy with nodal metastasis. He was referred for above procedure.  Other history includes former smoker, CAD (s/p RCA stent 2005), HLD, DM2, HTN, TIA (?08/15/2023, seen at [facility], brain MRI negative for acute CVA), GERD, OSA (occasional CPAP use), COPD, ILD, BPH, cholecystectomy (10/09/2023).  Last seen by cardiology 10/02/2023 by Emelia Hazy, NP for follow-up and preoperative evaluation prior to cholecystectomy which she had on 10/09/2023. He is status post stenting to his RCA in 2005, LHC 2006 after nuke showing ischemia, LHC 9/12 proximal LAD 60%, distal LAD 65%, Dx 60%, proximal circumflex 20%, mid circumflex 50%, OM1 30%, proximal RCA 20%, mid RCA stent patent, EF 65% and medical management was recommended. He had a non-ischemic nuclear stress test, EF 57% on 11/28/2022.  No additional cardiac testing recommended prior to cholecystectomy.  He was given permission to hold Plavix  and aspirin  for 5 days prior to that surgery.  9 to 12 months cardiology follow-up planned.    [name]  06/02/2024.  He reported last Plavix  dose was on 06/07/2024 PM.  He indicated he did not receive a letter with aspirin  or Plavix  instructions.  Dr. Shelah notified. He  is considering rescheduling the procedure.      VS:  Wt Readings from Last 3 Encounters:  04/30/24 90.3 kg  04/22/24 89.3 kg  01/09/24 88.8 kg   BP Readings from Last 3 Encounters:  04/30/24 122/60  04/22/24 120/68  01/09/24 132/80   Pulse Readings from Last 3 Encounters:  04/30/24 76  04/22/24 69  01/09/24 74     PROVIDERS: [name], Lang DASEN, PA-C is PCP  Delford Coy, MD is cardiologist  Theophilus Roosevelt, MD is pulmonologist   LABS: Most recent results in [facility] include: Lab Results  Component Value Date   WBC 7.9 04/22/2024   HGB 15.1 04/22/2024   HCT 45.4 04/22/2024   PLT 162 04/22/2024   GLUCOSE 108 (H) 04/22/2024   CHOL 100 04/22/2024   TRIG 77 04/22/2024   HDL 42 04/22/2024   LDLCALC 42 04/22/2024   ALT 20 04/22/2024   AST 12 04/22/2024   NA 137 04/22/2024   K 4.1 04/22/2024   CL 101 04/22/2024   CREATININE 0.74 (L) 04/22/2024   BUN 8 04/22/2024   CO2 22 04/22/2024   TSH 1.40 07/27/2023   PSA 1.68 07/27/2023   INR 0.9 04/13/2011   HGBA1C 7.8 (H) 04/22/2024   MICROALBUR 6.4 07/27/2023    OTHER:  PFTs 07/17/2022: FVC 4.16 [95%], FEV1 2.92 [91%], F/F90, TLC 6.77 [97%], DLCO 21.44 [83%] Normal test   PSG 07/02/2022: Sleep disordered breathing was present but delayed sleep onset made SPLIT protocol impossible to implement.  This is still considered mild Obstructive Sleep apnea at an AHI of 17/h but the severity is clearly associated with  supine sleep exacerbation and hypoxia/ orthopnoea.   IMAGES: CT Super D Chest 06/04/2024: IMPRESSION: 1. Lingular nodule with mediastinal adenopathy, hypermetabolic on recent PET and compatible with primary bronchogenic carcinoma. 2. 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 3. Cirrhosis. 4. Aortic atherosclerosis (ICD10-I70.0). Coronary artery calcification. 5.  Emphysema (ICD10-J43.9).    PET Scan 05/26/2024: IMPRESSION: - Hypermetabolic anterior lingula nodule  with associated mediastinal lymphadenopathy highly concerning for primary bronchogenic malignancy with nodal metastasis (N2) until proven otherwise. Recommend tissue sampling. - No other suspicious findings to suggest distant metastasis. 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 - Prostatomegaly with heterogeneous uptake. Correlate with PSA levels.   CT Head & CTA Head/Neck 08/15/2023 (Canopy/PACS): IMPRESSION: - No acute intracranial abnormality. - No evidence of hemodynamically significant intracranial stenosis, proximal occlusion or aneurysm. - No evidence of vascular dissection.   MRI Brain 08/15/2023 (Canopy/PACS): IMPRESSION: No evidence of acute infarction. Chronic microvascular ischemic disease.    EKG: EKG 10/02/2023:  Normal sinus rhythm Right bundle branch block When compared with ECG of 02-Oct-2023 15:45, No significant change was found Confirmed by Emelia Hazy [phone]) on 10/02/2023 3:51:14 PM   CV: US  Abd Aorta 11/19/2023: Summary:  Abdominal Aorta: No evidence of an abdominal aortic aneurysm was  visualized. The largest aortic measurement is 2.6 cm. Severe  atherosclerosis noted throughout aorta.  Stenosis:  Moderate atherosclerosis noted throughout iliac arteries, no evidence of  stenosis seen.  IVC/Iliac: There is no evidence of thrombus involving the IVC.    Echo 08/16/2023 [facility]): Conclusions: 1.  Left ventricular ejection fraction is 55 to 60%. 2.  Left ventricular diastolic function has an impaired relaxation pattern.  3.  Right ventricular global systolic function is normal. 4.  There is trivial mitral regurgitation. 5.  There is trivial tricuspid valve regurgitation. 6.  There is no pericardial effusion visualized.   Nuclear stress test 11/28/2022:   The study is normal. The study is low risk.   No ST deviation was noted.   LV perfusion is normal. There is no evidence of ischemia.  There is no evidence of infarction.   Left ventricular function is normal. Nuclear stress EF: 57 %. The left ventricular ejection fraction is normal (55-65%). End diastolic cavity size is normal. End systolic cavity size is normal.   Prior study available for comparison from 06/03/2019.   Normal stress nuclear study with mild apical thinning but no ischemia.  Gated ejection fraction 57% with normal wall motion.   Past Medical History:  Diagnosis Date   Aortic atherosclerosis (HCC)by Chest CT scan 11/2016 12/30/2017   Per chest CT 11/2016     Arthritis    B12 deficiency 04/22/2020   BPH (benign prostatic hyperplasia) 10/05/2014   CAD (coronary artery disease)    a. s/p stent to RCA 2005; b. LHC 2006 after Nuc with inf ischemia: patent RCA stent, D1 80%, LAD 60% => med Rx;  c. LHC 04/18/11: Proximal LAD 60% (unchanged from previous catheterization), distal LAD 65% (small caliber), Dx 60%, proximal CFX 20%, mid CFX 50%, OM1 30%, proximal RCA 20%, mid RCA stent patent, EF 65%.=> med Rx   COPD (chronic obstructive pulmonary disease) (HCC) 10/05/2014   Gallstones 02/21/2021   GERD (gastroesophageal reflux disease)    Hepatic steatosis 02/17/2020   Per CT 02/2020     History of colonic polyps 04/23/2020   HLD (hyperlipidemia)    Hyperlipidemia associated with type 2 diabetes mellitus (HCC) 04/03/2011   Hypertension  ILD (interstitial lung disease) (HCC) 02/21/2021   Suggested by CT 02/2021 - pulmonology referral placed     Mini stroke 08/2023   [facility]   Obesity    OSA (obstructive sleep apnea)    has a cpap-does not use-has a special mouthpiece he wears at night   RLS (restless legs syndrome) 10/05/2014   Type 2 diabetes mellitus (HCC) 06/05/2014   Type II or unspecified type diabetes mellitus without mention of complication, not stated as uncontrolled    Vitamin D  deficiency    Wears glasses     Past Surgical History:  Procedure Laterality Date   CARDIAC CATHETERIZATION   2005   stent   CHOLECYSTECTOMY N/A 10/09/2023   Procedure: LAPAROSCOPIC CHOLECYSTECTOMY WITH INDOCYANINE GREEN  DYE;  Surgeon: Signe Mitzie LABOR, MD;  Location: [facility];  Service: General;  Laterality: N/A;   CHONDROPLASTY  06/25/2017   Procedure: CHONDROPLASTY;  Surgeon: Yvone Rush, MD;  Location: [facility];  Service: Orthopedics;;   COLONOSCOPY  11/2011   Due 5 years Dr. Aneita   heart stent  2005   RCA   INGUINAL HERNIA REPAIR Right 11/16/2014   Procedure: RIGHT INGUINAL HERNIA REPAIR WITH MESH;  Surgeon: Krystal Spinner, MD;  Location: [facility];  Service: General;  Laterality: Right;   INSERTION OF MESH Right 11/16/2014   Procedure: INSERTION OF MESH;  Surgeon: Krystal Spinner, MD;  Location: [facility];  Service: General;  Laterality: Right;   KNEE ARTHROSCOPY WITH MEDIAL MENISECTOMY Bilateral 06/25/2017   Procedure: right knee arthroscopy with medial meniscectomy, chondroplasty, Left knee arthroscopy with medial menisectomy removal of plica and chondroplasty;  Surgeon: Yvone Rush, MD;  Location:  SURGERY CENTER;  Service: Orthopedics;  Laterality: Bilateral;   POLYPECTOMY      MEDICATIONS: No current facility-administered medications for this encounter.    acetaminophen  (TYLENOL ) 500 MG tablet   albuterol  (VENTOLIN  HFA) 108 (90 Base) MCG/ACT inhaler   Blood Glucose Monitoring Suppl DEVI   Cinnamon 500 MG TABS   clopidogrel  (PLAVIX ) 75 MG tablet   doxazosin  (CARDURA ) 8 MG tablet   Dulaglutide  ([name] ) 3 MG/0.5ML SOPN   fexofenadine (ALLEGRA) 60 MG tablet   finasteride  (PROSCAR ) 5 MG tablet   fluticasone  furoate-vilanterol (BREO ELLIPTA ) 100-25 MCG/ACT AEPB   glimepiride  (AMARYL ) 4 MG tablet   metFORMIN  (GLUCOPHAGE -XR) 500 MG 24 hr tablet   nicotine  (NICODERM CQ  - DOSED IN MG/24 HOURS) 14 mg/24hr patch   nitroGLYCERIN  (NITROSTAT ) 0.4 MG SL tablet   pramipexole  (MIRAPEX ) 1 MG tablet   promethazine -dextromethorphan  (PROMETHAZINE -DM) 6.25-15 MG/5ML syrup   ramipril  (ALTACE ) 5 MG capsule   rosuvastatin  (CRESTOR ) 20 MG tablet   Varenicline  Tartrate, Starter, (CHANTIX  STARTING MONTH PAK) 0.5 MG X 11 & 1 MG X 42 TBPK    regadenoson  (LEXISCAN ) injection SOLN 0.4 mg   technetium tetrofosmin  (TC-MYOVIEW ) injection 32.3 millicurie    Isaiah Ruder, PA-C Surgical Short Stay/Anesthesiology [facility] Phone [phone] [facility] Phone [phone] 06/09/2024 4:32 PM

## 2024-06-09 NOTE — Anesthesia Preprocedure Evaluation (Signed)
 Anesthesia Evaluation    Airway        Dental   Pulmonary Patient abstained from smoking., former smoker          Cardiovascular hypertension,      Neuro/Psych    GI/Hepatic   Endo/Other  diabetes    Renal/GU      Musculoskeletal   Abdominal   Peds  Hematology   Anesthesia Other Findings   Reproductive/Obstetrics                              Anesthesia Physical Anesthesia Plan  ASA:   Anesthesia Plan:    Post-op Pain Management:    Induction:   PONV Risk Score and Plan:   Airway Management Planned:   Additional Equipment:   Intra-op Plan:   Post-operative Plan:   Informed Consent:   Plan Discussed with:   Anesthesia Plan Comments: (PAT note written 06/09/2024 by Fonda Rochon, PA-C.  )        Anesthesia Quick Evaluation

## 2024-06-09 NOTE — Telephone Encounter (Signed)
 Per Dr. Shelah patients bronch for 10/28 is going to have to be rescheduled.   Tried calling Morna back at [phone]. No answer- left vm stating bronch scheduled for 10/28 is going to have to be rescheduled due to pt not holding plavix  for 5 days.

## 2024-06-09 NOTE — Telephone Encounter (Signed)
 Received incoming call from [place] with [facility] regarding Bronch for 10/28. Pt states he never received bronch letter information and was never informed. Pt took his last dose of Plavix  Saturday night 10/25. I have sent Dr. Shelah a secure message regarding this.   Dr. Shelah can you please advise

## 2024-06-09 NOTE — Progress Notes (Signed)
 SDW CALL  Patient was given pre-op instructions over the phone. The opportunity was given for the patient to ask questions. No further questions asked. Patient verbalized understanding of instructions given.   PCP - Lang [name] Cardiologist - Dr. Maude Emmer - LOV 10/02/23  PPM/ICD - denies Device Orders - n/a Rep Notified -  n/a  Chest CT - 06/06/24 EKG - 10/02/23 Stress Test -  ECHO - 08/16/23 Cardiac Cath - 2005  Sleep Study - OSA+ - wears CPAP on occassion   Fasting Blood Sugar - 150 Checks Blood Sugar twice a week  Last dose of GLP1 agonist-  last dose of [name]  was 06/02/24 GLP1 instructions:  patient aware not to take  Blood Thinner Instructions: patient did not receive letter with Aspirin  and Plavix  instructions - patient took his last dose of Plavix  on Saturday evening - Noelle at Dr. Lanny office was made aware Aspirin  Instructions: patient states that he does Aspirin   ERAS Protcol - NPO PRE-SURGERY Ensure or G2-  n/a  COVID TEST- n/a   Anesthesia review: yes - cardiac history - pt takes Plavix   Patient denies shortness of breath, fever, cough and chest pain over the phone call   All instructions explained to the patient, with a verbal understanding of the material. Patient agrees to go over the instructions while at home for a better understanding.

## 2024-06-10 ENCOUNTER — Encounter (HOSPITAL_COMMUNITY): Admission: RE | Payer: Self-pay | Source: Home / Self Care

## 2024-06-10 ENCOUNTER — Encounter (HOSPITAL_COMMUNITY): Payer: Self-pay | Admitting: Vascular Surgery

## 2024-06-10 ENCOUNTER — Ambulatory Visit (HOSPITAL_COMMUNITY): Admission: RE | Admit: 2024-06-10 | Source: Home / Self Care | Admitting: Emergency Medicine

## 2024-06-10 ENCOUNTER — Encounter: Payer: Self-pay | Admitting: Pulmonary Disease

## 2024-06-10 DIAGNOSIS — R911 Solitary pulmonary nodule: Secondary | ICD-10-CM | POA: Insufficient documentation

## 2024-06-10 SURGERY — BRONCHOSCOPY, WITH EBUS
Anesthesia: General | Laterality: Left

## 2024-06-10 NOTE — Telephone Encounter (Signed)
 Patient is aware of new appointments and they are good with it.

## 2024-06-10 NOTE — Telephone Encounter (Signed)
 Patient has been rescheduled to Dr.Dewald on 06/24/24 and patient has been rescheduled from [name] appointment 07/01/24.

## 2024-06-10 NOTE — Telephone Encounter (Signed)
 He is aware. He made sure to write this down so he doesn't forget.

## 2024-06-10 NOTE — Telephone Encounter (Signed)
 Thanks - Please make sure he understands the medication instructions, esp the Plavix 

## 2024-06-11 ENCOUNTER — Telehealth: Payer: Self-pay

## 2024-06-11 NOTE — Telephone Encounter (Signed)
 PAP: Patient assistance application for [name]  through [employer] has been mailed to pt's home address on file. Provider portion of application will be faxed to provider's office.  Provicer portion has been faxed to [employer]

## 2024-06-16 ENCOUNTER — Ambulatory Visit: Admitting: Acute Care

## 2024-06-23 ENCOUNTER — Other Ambulatory Visit: Payer: Self-pay

## 2024-06-23 ENCOUNTER — Encounter (HOSPITAL_COMMUNITY): Payer: Self-pay | Admitting: Pulmonary Disease

## 2024-06-23 NOTE — Progress Notes (Signed)
 SDW CALL  Patient was given pre-op instructions over the phone. The opportunity was given for the patient to ask questions. No further questions asked. Patient verbalized understanding of instructions given.   PCP - Lang [name] Cardiologist - Dr. Maude Emmer - LOV - 10/02/23  PPM/ICD - denies Device Orders - n/a Rep Notified - n/a  Chest CT - 06/06/24 EKG - 10/02/23 Stress Test -  ECHO - 08/16/23 Cardiac Cath - 2005  Sleep Study - OSA + - wears CPAP occasionally but has been wearing it more often  Fasting Blood Sugar - unknown this week - has not checked in a couple of weeks - educated patient to check blood sugar today and tomorrow and if he had a reading of less than 70, he would need to drink 1/2 cup of apple or cranberry juice and recheck blood sugar   Last dose of GLP1 agonist-  last dose of [name]  11/3 - patient educated not to take today.    Blood Thinner Instructions: Patient reports that last dose of Plavix  was at least 10 days ago, possibly 12 days ago Aspirin  Instructions: patient states he is not taking Aspirin   ERAS Protcol - NPO PRE-SURGERY Ensure or G2- n/a  COVID TEST- n/a   Anesthesia review: yes - cardiac history; Plavix   Patient denies shortness of breath, fever, cough and chest pain over the phone call   All instructions explained to the patient, with a verbal understanding of the material. Patient agrees to go over the instructions while at home for a better understanding.

## 2024-06-23 NOTE — Anesthesia Preprocedure Evaluation (Addendum)
 Anesthesia Evaluation  Patient identified by MRN, date of birth, ID band Patient awake    Reviewed: Allergy & Precautions, NPO status , Patient's Chart, lab work & pertinent test results  Airway Mallampati: II  TM Distance: >3 FB Neck ROM: Full    Dental  (+) Dental Advisory Given, Loose,    Pulmonary sleep apnea and Continuous Positive Airway Pressure Ventilation , COPD, neg recent URI, Patient abstained from smoking., former smoker   Pulmonary exam normal breath sounds clear to auscultation       Cardiovascular hypertension, Pt. on medications + CAD, + Cardiac Stents and + Peripheral Vascular Disease  Normal cardiovascular exam Rhythm:Regular Rate:Normal  Echo 08/2023 EF 55-60%, trivial MR and TR   Neuro/Psych TIA   GI/Hepatic Neg liver ROS,GERD  Controlled,,  Endo/Other  diabetes    Renal/GU negative Renal ROS     Musculoskeletal  (+) Arthritis ,    Abdominal   Peds  Hematology negative hematology ROS (+)   Anesthesia Other Findings   Reproductive/Obstetrics                              Anesthesia Physical Anesthesia Plan  ASA: 3  Anesthesia Plan: General   Post-op Pain Management: Minimal or no pain anticipated   Induction: Intravenous  PONV Risk Score and Plan: 2 and Ondansetron , Dexamethasone , Treatment may vary due to age or medical condition, Propofol  infusion and TIVA  Airway Management Planned: Oral ETT  Additional Equipment:   Intra-op Plan:   Post-operative Plan: Extubation in OR  Informed Consent: I have reviewed the patients History and Physical, chart, labs and discussed the procedure including the risks, benefits and alternatives for the proposed anesthesia with the patient or authorized representative who has indicated his/her understanding and acceptance.     Dental advisory given  Plan Discussed with: CRNA  Anesthesia Plan Comments: (PAT note written  by Allison Zelenak, PA-C. Rescheduled from 06/10/2024 due to Plavix  not being held.  Now last Plavix  > 1 week ago.  )         Anesthesia Quick Evaluation

## 2024-06-24 ENCOUNTER — Ambulatory Visit (HOSPITAL_COMMUNITY): Payer: Self-pay | Admitting: Vascular Surgery

## 2024-06-24 ENCOUNTER — Encounter (HOSPITAL_COMMUNITY)
Admission: RE | Disposition: A | Discharge status: Home / Self Care | Payer: Self-pay | Source: Home / Self Care | Attending: Pulmonary Disease

## 2024-06-24 ENCOUNTER — Telehealth: Payer: Self-pay | Admitting: Pulmonary Disease

## 2024-06-24 ENCOUNTER — Encounter (HOSPITAL_COMMUNITY): Payer: Self-pay | Admitting: Pulmonary Disease

## 2024-06-24 ENCOUNTER — Ambulatory Visit (HOSPITAL_COMMUNITY)

## 2024-06-24 ENCOUNTER — Ambulatory Visit (HOSPITAL_COMMUNITY)
Admission: RE | Admit: 2024-06-24 | Discharge: 2024-06-24 | Disposition: A | Discharge status: Home / Self Care | Attending: Pulmonary Disease | Admitting: Pulmonary Disease

## 2024-06-24 DIAGNOSIS — I2510 Atherosclerotic heart disease of native coronary artery without angina pectoris: Secondary | ICD-10-CM | POA: Diagnosis not present

## 2024-06-24 DIAGNOSIS — I10 Essential (primary) hypertension: Secondary | ICD-10-CM | POA: Diagnosis not present

## 2024-06-24 DIAGNOSIS — R911 Solitary pulmonary nodule: Secondary | ICD-10-CM | POA: Diagnosis not present

## 2024-06-24 DIAGNOSIS — Z87891 Personal history of nicotine dependence: Secondary | ICD-10-CM | POA: Insufficient documentation

## 2024-06-24 DIAGNOSIS — J849 Interstitial pulmonary disease, unspecified: Secondary | ICD-10-CM | POA: Insufficient documentation

## 2024-06-24 DIAGNOSIS — Z955 Presence of coronary angioplasty implant and graft: Secondary | ICD-10-CM | POA: Diagnosis not present

## 2024-06-24 DIAGNOSIS — J449 Chronic obstructive pulmonary disease, unspecified: Secondary | ICD-10-CM | POA: Diagnosis not present

## 2024-06-24 DIAGNOSIS — C3412 Malignant neoplasm of upper lobe, left bronchus or lung: Secondary | ICD-10-CM | POA: Diagnosis not present

## 2024-06-24 DIAGNOSIS — R590 Localized enlarged lymph nodes: Secondary | ICD-10-CM | POA: Insufficient documentation

## 2024-06-24 DIAGNOSIS — E1151 Type 2 diabetes mellitus with diabetic peripheral angiopathy without gangrene: Secondary | ICD-10-CM | POA: Insufficient documentation

## 2024-06-24 DIAGNOSIS — Z8673 Personal history of transient ischemic attack (TIA), and cerebral infarction without residual deficits: Secondary | ICD-10-CM | POA: Insufficient documentation

## 2024-06-24 DIAGNOSIS — G4733 Obstructive sleep apnea (adult) (pediatric): Secondary | ICD-10-CM | POA: Insufficient documentation

## 2024-06-24 HISTORY — PX: VIDEO BRONCHOSCOPY WITH ENDOBRONCHIAL ULTRASOUND: SHX6177

## 2024-06-24 HISTORY — PX: VIDEO BRONCHOSCOPY WITH ENDOBRONCHIAL NAVIGATION: SHX6175

## 2024-06-24 LAB — CBC
HCT: 42.5 % (ref 39.0–52.0)
Hemoglobin: 14.3 g/dL (ref 13.0–17.0)
MCH: 31.4 pg (ref 26.0–34.0)
MCHC: 33.6 g/dL (ref 30.0–36.0)
MCV: 93.4 fL (ref 80.0–100.0)
Platelets: 152 K/uL (ref 150–400)
RBC: 4.55 MIL/uL (ref 4.22–5.81)
RDW: 12.3 % (ref 11.5–15.5)
WBC: 7.8 K/uL (ref 4.0–10.5)
nRBC: 0 % (ref 0.0–0.2)

## 2024-06-24 LAB — GLUCOSE, CAPILLARY
Glucose-Capillary: 220 mg/dL — ABNORMAL HIGH (ref 70–99)
Glucose-Capillary: 168 mg/dL — ABNORMAL HIGH (ref 70–99)
Glucose-Capillary: 186 mg/dL — ABNORMAL HIGH (ref 70–99)

## 2024-06-24 LAB — BASIC METABOLIC PANEL WITH GFR
Anion gap: 9 (ref 5–15)
BUN: 8 mg/dL (ref 8–23)
CO2: 22 mmol/L (ref 22–32)
Calcium: 9.2 mg/dL (ref 8.9–10.3)
Chloride: 103 mmol/L (ref 98–111)
Creatinine, Ser: 0.81 mg/dL (ref 0.61–1.24)
GFR, Estimated: >60 mL/min (ref >60)
Glucose, Bld: 203 mg/dL — ABNORMAL HIGH (ref 70–99)
Potassium: 5.1 mmol/L (ref 3.5–5.1)
Sodium: 134 mmol/L — ABNORMAL LOW (ref 135–145)

## 2024-06-24 SURGERY — BRONCHOSCOPY, WITH EBUS
Anesthesia: General | Laterality: Left

## 2024-06-24 MED ORDER — ONDANSETRON HCL 4 MG/2ML IJ SOLN
INTRAMUSCULAR | Status: DC | PRN
  Administered 2024-06-24: 4 mg via INTRAVENOUS

## 2024-06-24 MED ORDER — FENTANYL CITRATE (PF) 100 MCG/2ML IJ SOLN
INTRAMUSCULAR | Status: AC
  Filled 2024-06-24: qty 2

## 2024-06-24 MED ORDER — ROCURONIUM BROMIDE 10 MG/ML (PF) SYRINGE
PREFILLED_SYRINGE | INTRAVENOUS | Status: DC | PRN
  Administered 2024-06-24: 40 mg via INTRAVENOUS
  Administered 2024-06-24: 60 mg via INTRAVENOUS

## 2024-06-24 MED ORDER — EPHEDRINE SULFATE (PRESSORS) 25 MG/5ML IV SOSY
PREFILLED_SYRINGE | INTRAVENOUS | Status: DC | PRN
  Administered 2024-06-24: 5 mg via INTRAVENOUS

## 2024-06-24 MED ORDER — DEXAMETHASONE SOD PHOSPHATE PF 10 MG/ML IJ SOLN
INTRAMUSCULAR | Status: DC | PRN
  Administered 2024-06-24: 5 mg via INTRAVENOUS

## 2024-06-24 MED ORDER — DROPERIDOL 2.5 MG/ML IJ SOLN: 0.6250 mg | Freq: Once | INTRAMUSCULAR | Status: DC | PRN

## 2024-06-24 MED ORDER — CHLORHEXIDINE GLUCONATE 0.12 % MT SOLN: 15.0000 mL | Freq: Once | OROMUCOSAL | Status: AC

## 2024-06-24 MED ORDER — LIDOCAINE 2% (20 MG/ML) 5 ML SYRINGE
INTRAMUSCULAR | Status: DC | PRN
  Administered 2024-06-24: 100 mg via INTRAVENOUS

## 2024-06-24 MED ORDER — FENTANYL CITRATE (PF) 250 MCG/5ML IJ SOLN
INTRAMUSCULAR | Status: DC | PRN
  Administered 2024-06-24: 100 ug via INTRAVENOUS
  Administered 2024-06-24: 50 ug via INTRAVENOUS

## 2024-06-24 MED ORDER — PHENYLEPHRINE HCL-NACL 20-0.9 MG/250ML-% IV SOLN
INTRAVENOUS | Status: DC | PRN
Start: 2024-06-24 — End: 2024-06-24
  Administered 2024-06-24: 20 ug/min via INTRAVENOUS

## 2024-06-24 MED ORDER — SUGAMMADEX SODIUM 200 MG/2ML IV SOLN
INTRAVENOUS | Status: DC | PRN
  Administered 2024-06-24: 200 mg via INTRAVENOUS

## 2024-06-24 MED ORDER — INSULIN ASPART 100 UNIT/ML IJ SOLN
0.0000 [IU] | INTRAMUSCULAR | Status: DC | PRN
  Administered 2024-06-24: 3 [IU] via SUBCUTANEOUS
  Filled 2024-06-24: qty 3

## 2024-06-24 MED ORDER — MEPERIDINE HCL 25 MG/ML IJ SOLN: 6.2500 mg | INTRAMUSCULAR | Status: DC | PRN

## 2024-06-24 MED ORDER — LACTATED RINGERS IV SOLN: INTRAVENOUS | Status: DC

## 2024-06-24 MED ORDER — CHLORHEXIDINE GLUCONATE 0.12 % MT SOLN
OROMUCOSAL | Status: AC
  Administered 2024-06-24: 15 mL via OROMUCOSAL
  Filled 2024-06-24: qty 15

## 2024-06-24 MED ORDER — PROPOFOL 10 MG/ML IV BOLUS
INTRAVENOUS | Status: DC | PRN
  Administered 2024-06-24: 100 mg via INTRAVENOUS
  Administered 2024-06-24: 150 ug/kg/min via INTRAVENOUS

## 2024-06-24 NOTE — Procedures (Signed)
 Procedure Note  Patient: Joe Green  [employer] Cios mobile C-arm was utilized to identify and biopsy of lingular nodule.   Needle-in-lesion was confirmed using real-time Cios imaging, and images were uploaded to PACS.   Needle in lesion: lingula   Dorn Chill, MD [facility] Office: [phone]   See Amion for personal pager PCCM on call pager [phone] until 7pm. Please call Elink 7p-7a. [phone]

## 2024-06-24 NOTE — Telephone Encounter (Signed)
 Please schedule patient for procedure follow up with [name] on 11/17 or 11/18 to review bronchoscopy results.  Thanks, JD

## 2024-06-24 NOTE — Op Note (Signed)
 Video Bronchoscopy with Robotic Assisted Bronchoscopic Navigation   Date of Operation: 06/24/2024   Pre-op Diagnosis: Lung Nodule  Post-op Diagnosis: Lung Nodule  Surgeon: Dorn Chill, MD  Anesthesia: General endotracheal anesthesia  Operation: Flexible video fiberoptic bronchoscopy with robotic assistance and biopsies.  Estimated Blood Loss: Minimal  Complications: None  Indications and History: Joe Green is a 73 y.o. male with history of ILD and lung nodule.  Recommendation made to achieve a tissue diagnosis via robotic assisted navigational bronchoscopy.  The risks, benefits, complications, treatment options and expected outcomes were discussed with the patient.  The possibilities of pneumothorax, pneumonia, reaction to medication, pulmonary aspiration, perforation of a viscus, bleeding, failure to diagnose a condition and creating a complication requiring transfusion or operation were discussed with the patient who freely signed the consent.    Description of Procedure: The patient was seen in the Preoperative Area, was examined and was deemed appropriate to proceed.  The patient was taken to [facility] Endoscopy room 3, identified as [facility] and the procedure verified as Flexible Video Fiberoptic Bronchoscopy.  A Time Out was held and the above information confirmed.   Prior to the date of the procedure a high-resolution CT scan of the chest was performed. Utilizing ION software program a virtual tracheobronchial tree was generated to allow the creation of distinct navigation pathways to the patient's parenchymal abnormalities. After being taken to the operating room general anesthesia was initiated and the patient  was orally intubated. The video fiberoptic bronchoscope was introduced via the endotracheal tube and a general inspection was performed which showed normal right and left lung anatomy. Aspiration of the bilateral mainstems was completed to remove any remaining  secretions. Robotic catheter inserted into patient's endotracheal tube.   Target #1 Lingula Nodule: The distinct navigation pathways prepared prior to this procedure were then utilized to navigate to patient's lesion identified on CT scan. The robotic catheter was secured into place and the vision probe was withdrawn.  Lesion location was approximated using fluoroscopy.  Local registration and targeting was performed using [employer] Cios mobile C-arm three-dimensional imaging. Under fluoroscopic guidance transbronchial needle biopsies and transbronchial forceps biopsies were performed to be sent for cytology and pathology.  Needle-in-lesion was confirmed using Cios mobile C-arm.  A bronchioalveolar lavage was performed in the Lingula and sent for microbiology.   EBUS: The EBUS scope was introduced after robotic assisted navigation bronchoscopy was removed. Enlarged 10L station but unable to achieve safe approximation due to proximity with blood vessels so EBUS was aborted. No other enlarged stations at 7 or 4L.   Samples Target #1: 1. Transbronchial needle biopsies from lingula nodule 2. Transbronchial forceps biopsies from lingula nodule 3. Bronchoalveolar lavage from lingula   Plans:  The patient will be discharged from the PACU to home when recovered from anesthesia and after chest x-ray is reviewed. We will review the cytology, pathology and microbiology results with the patient when they become available. Outpatient followup will be with [name], NP.

## 2024-06-24 NOTE — H&P (Signed)
 Subjective:   PATIENT ID: [employer] GENDER: male DOB: 1951/03/11, MRN: [id number]   HPI Tymarion Green is a 73 year old male, daily smoker with ILD who is referred for robotic assisted navigational bronchoscopy for biopsies of a lingular nodule that is pet avid along with pet avid hilar and mediastinal lymph nodes.  Reviewed risks of procedure which include bleeding and pneumothorax along with infection. He has held his plavix  for 10 days.   All questions answered.   Past Medical History:  Diagnosis Date   Aortic atherosclerosis (HCC)by Chest CT scan 11/2016 12/30/2017   Per chest CT 11/2016     Arthritis    B12 deficiency 04/22/2020   BPH (benign prostatic hyperplasia) 10/05/2014   CAD (coronary artery disease)    a. s/p stent to RCA 2005; b. LHC 2006 after Nuc with inf ischemia: patent RCA stent, D1 80%, LAD 60% => med Rx;  c. LHC 04/18/11: Proximal LAD 60% (unchanged from previous catheterization), distal LAD 65% (small caliber), Dx 60%, proximal CFX 20%, mid CFX 50%, OM1 30%, proximal RCA 20%, mid RCA stent patent, EF 65%.=> med Rx   COPD (chronic obstructive pulmonary disease) (HCC) 10/05/2014   Gallstones 02/21/2021   GERD (gastroesophageal reflux disease)    Hepatic steatosis 02/17/2020   Per CT 02/2020     History of colonic polyps 04/23/2020   HLD (hyperlipidemia)    Hyperlipidemia associated with type 2 diabetes mellitus (HCC) 04/03/2011   Hypertension    ILD (interstitial lung disease) (HCC) 02/21/2021   Suggested by CT 02/2021 - pulmonology referral placed     Mini stroke 08/2023   [facility]   Obesity    OSA (obstructive sleep apnea)    has a cpap-does not use-has a special mouthpiece he wears at night   RLS (restless legs syndrome) 10/05/2014   Type 2 diabetes mellitus (HCC) 06/05/2014   Type II or unspecified type diabetes mellitus without mention of complication, not stated as uncontrolled    Vitamin D  deficiency    Wears glasses      Family  History  Problem Relation Age of Onset   Cancer Mother        vaginal?    Prostate cancer Father    Hypertension Father    Breast cancer Sister    Breast cancer Sister    Kidney cancer Maternal Grandmother    Colon cancer Neg Hx    Colon polyps Neg Hx    Esophageal cancer Neg Hx    Rectal cancer Neg Hx    Stomach cancer Neg Hx      Social History   Socioeconomic History   Marital status: Married    Spouse name: Not on file   Number of children: 3   Years of education: Not on file   Highest education level: Not on file  Occupational History   Not on file  Tobacco Use   Smoking status: Former    Current packs/day: 0.25    Average packs/day: 0.3 packs/day for 55.9 years (14.0 ttl pk-yrs)    Types: Cigarettes    Start date: 1970    Passive exposure: Current   Smokeless tobacco: Never   Tobacco comments:    3-4 cigs a day 04/30/2024  Vaping Use   Vaping status: Never Used  Substance and Sexual Activity   Alcohol use: Not Currently    Comment: 14 a week   Drug use: No   Sexual activity: Yes    Partners: Female  Other Topics Concern   Not on file  Social History Narrative   Not on file   Social Drivers of Health   Financial Resource Strain: Not on file  Food Insecurity: No Food Insecurity (10/12/2023)   Hunger Vital Sign    Worried About Running Out of Food in the Last Year: Never true    Ran Out of Food in the Last Year: Never true  Transportation Needs: No Transportation Needs (10/12/2023)   PRAPARE - Administrator, Civil Service (Medical): No    Lack of Transportation (Non-Medical): No  Physical Activity: Sufficiently Active (01/01/2018)   Exercise Vital Sign    Days of Exercise per Week: 5 days    Minutes of Exercise per Session: 60 min  Stress: No Stress Concern Present (03/10/2019)   [employer] of Occupational [employer] - Occupational Stress Questionnaire    Feeling of Stress : Only a little  Social Connections: Not on file  Intimate  Partner Violence: Not At Risk (10/12/2023)   Humiliation, Afraid, Rape, and Kick questionnaire    Fear of Current or Ex-Partner: No    Emotionally Abused: No    Physically Abused: No    Sexually Abused: No     No Known Allergies    Review of Systems  Constitutional:  Negative for chills, fever, malaise/fatigue and weight loss.  HENT:  Negative for congestion, sinus pain and sore throat.   Eyes: Negative.   Respiratory:  Negative for cough, hemoptysis, sputum production, shortness of breath and wheezing.   Cardiovascular:  Negative for chest pain, palpitations, orthopnea, claudication and leg swelling.  Gastrointestinal:  Negative for abdominal pain, heartburn, nausea and vomiting.  Genitourinary: Negative.   Musculoskeletal:  Negative for joint pain and myalgias.  Skin:  Negative for rash.  Neurological:  Negative for weakness.  Endo/Heme/Allergies: Negative.   Psychiatric/Behavioral: Negative.     Objective:   Vitals:   06/24/24 0610  BP: (!) 149/84  Pulse: 70  Resp: 20  Temp: 97.6 F (36.4 C)  TempSrc: Oral  SpO2: 94%  Weight: 88 kg  Height: 5' 10 (1.778 m)   Physical Exam Constitutional:      General: He is not in acute distress.    Appearance: Normal appearance.  Eyes:     General: No scleral icterus.    Conjunctiva/sclera: Conjunctivae normal.  Cardiovascular:     Rate and Rhythm: Normal rate and regular rhythm.  Pulmonary:     Breath sounds: No wheezing, rhonchi or rales.  Musculoskeletal:     Right lower leg: No edema.     Left lower leg: No edema.  Skin:    General: Skin is warm and dry.  Neurological:     General: No focal deficit present.    CBC    Component Value Date/Time   WBC 7.8 06/24/2024 0619   RBC 4.55 06/24/2024 0619   HGB 14.3 06/24/2024 0619   HGB 15.1 04/22/2024 1356   HCT 42.5 06/24/2024 0619   HCT 45.4 04/22/2024 1356   PLT 152 06/24/2024 0619   PLT 162 04/22/2024 1356   MCV 93.4 06/24/2024 0619   MCV 95 04/22/2024 1356    MCH 31.4 06/24/2024 0619   MCHC 33.6 06/24/2024 0619   RDW 12.3 06/24/2024 0619   RDW 12.4 04/22/2024 1356   LYMPHSABS 1.8 04/22/2024 1356   MONOABS 1.1 (H) 10/31/2022 1018   EOSABS 0.3 04/22/2024 1356   BASOSABS 0.0 04/22/2024 1356     Chest imaging: CT Chest 06/04/24  1. Lingular nodule with mediastinal adenopathy, hypermetabolic on recent PET and compatible with primary bronchogenic carcinoma. 2. 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 3. Cirrhosis. 4. Aortic atherosclerosis (ICD10-I70.0). Coronary artery calcification. 5.  Emphysema (ICD10-J43.9).  PFT:    Latest Ref Rng & Units 07/18/2022   10:59 AM 05/24/2021   10:42 AM  PFT Results  FVC-Pre L 4.05  4.15   FVC-Predicted Pre % 93  94   FVC-Post L 4.16  4.24   FVC-Predicted Post % 95  96   Pre FEV1/FVC % % 71  70   Post FEV1/FCV % % 70  72   FEV1-Pre L 2.87  2.90   FEV1-Predicted Pre % 89  89   FEV1-Post L 2.92  3.05   DLCO uncorrected ml/min/mmHg 21.44  19.86   DLCO UNC% % 83  77   DLCO corrected ml/min/mmHg 21.44  19.86   DLCO COR %Predicted % 83  77   DLVA Predicted % 84  77   TLC L 6.77  6.77   TLC % Predicted % 97  97   RV % Predicted % 112  117     Labs:  Path:  Echo:  Heart Catheterization:       Assessment & Plan:   Lingular Lung Nodule Mediastinal/Hilar Adenopathy  - plan to proceed with robotic assisted navigational bronchoscopy and EBUS - Risks reviewed and consent signed  Dorn Chill, MD  Pulmonary & Critical Care Office: [phone]   Current Facility-Administered Medications:    fentaNYL  (SUBLIMAZE ) 100 MCG/2ML injection, , , ,    insulin  aspart (novoLOG ) injection 0-7 Units, 0-7 Units, Subcutaneous, Q2H PRN, Darlyn Rush, MD, 3 Units at 06/24/24 [id number]   lactated ringers  infusion, , Intravenous, Continuous, Darlyn Rush, MD, Last Rate: 10 mL/hr at 06/24/24 0624, New Bag at 06/24/24 0624  Facility-Administered  Medications Ordered in Other Encounters:    regadenoson  (LEXISCAN ) injection SOLN 0.4 mg, 0.4 mg, Intravenous, Once, Pietro Redell RAMAN, MD   technetium tetrofosmin  (TC-MYOVIEW ) injection 32.3 millicurie, 32.3 millicurie, Intravenous, Once PRN, Pietro, Redell RAMAN, MD

## 2024-06-24 NOTE — Anesthesia Procedure Notes (Signed)
 Procedure Name: Intubation Date/Time: 06/24/2024 7:38 AM  Performed by: Darlyn Rush, MDPre-anesthesia Checklist: Patient identified, Emergency Drugs available, Suction available and Patient being monitored Patient Re-evaluated:Patient Re-evaluated prior to induction Oxygen Delivery Method: Circle system utilized Preoxygenation: Pre-oxygenation with 100% oxygen Induction Type: IV induction Ventilation: Mask ventilation without difficulty Laryngoscope Size: Mac and 4 Grade View: Grade I Tube type: Oral Tube size: 8.5 mm Number of attempts: 1 Airway Equipment and Method: Stylet and Oral airway Placement Confirmation: ETT inserted through vocal cords under direct vision, positive ETCO2 and breath sounds checked- equal and bilateral Secured at: 23 cm Tube secured with: Tape Dental Injury: Teeth and Oropharynx as per pre-operative assessment

## 2024-06-24 NOTE — Anesthesia Postprocedure Evaluation (Signed)
 Anesthesia Post Note  Patient: Joe Green  Procedure(s) Performed: BRONCHOSCOPY, WITH EBUS (Bilateral) VIDEO BRONCHOSCOPY WITH ENDOBRONCHIAL NAVIGATION (Left)     Patient location during evaluation: PACU Anesthesia Type: General Level of consciousness: sedated and patient cooperative Pain management: pain level controlled Vital Signs Assessment: post-procedure vital signs reviewed and stable Respiratory status: spontaneous breathing Cardiovascular status: stable Anesthetic complications: no   There were no known notable events for this encounter.  Last Vitals:  Vitals:   06/24/24 0945 06/24/24 1000  BP: 124/67 122/72  Pulse: 94 91  Resp: 19 15  Temp:  36.6 C  SpO2: 94% 96%    Last Pain:  Vitals:   06/24/24 1000  TempSrc:   PainSc: 0-No pain                 Norleen Pope

## 2024-06-24 NOTE — Transfer of Care (Signed)
 Immediate Anesthesia Transfer of Care Note  Patient: Joe Green  Procedure(s) Performed: BRONCHOSCOPY, WITH EBUS (Bilateral) VIDEO BRONCHOSCOPY WITH ENDOBRONCHIAL NAVIGATION (Left)  Patient Location: PACU  Anesthesia Type:General  Level of Consciousness: awake, alert , and oriented  Airway & Oxygen Therapy: Patient Spontanous Breathing and Patient connected to face mask oxygen  Post-op Assessment: Report given to RN and Post -op Vital signs reviewed and stable  Post vital signs: Reviewed and stable  Last Vitals:  Vitals Value Taken Time  BP 123/65 06/24/24 09:22  Temp    Pulse 96 06/24/24 09:25  Resp 22 06/24/24 09:25  SpO2 91 % 06/24/24 09:25  Vitals shown include unfiled device data.  Last Pain:  Vitals:   06/24/24 0623  TempSrc:   PainSc: 0-No pain         Complications: There were no known notable events for this encounter.

## 2024-06-25 ENCOUNTER — Encounter (HOSPITAL_COMMUNITY): Payer: Self-pay | Admitting: Pulmonary Disease

## 2024-06-25 DIAGNOSIS — E119 Type 2 diabetes mellitus without complications: Secondary | ICD-10-CM | POA: Diagnosis not present

## 2024-06-25 DIAGNOSIS — H35362 Drusen (degenerative) of macula, left eye: Secondary | ICD-10-CM | POA: Diagnosis not present

## 2024-06-25 DIAGNOSIS — H25813 Combined forms of age-related cataract, bilateral: Secondary | ICD-10-CM | POA: Diagnosis not present

## 2024-06-25 LAB — ACID FAST SMEAR (AFB, MYCOBACTERIA)
Acid Fast Smear: NEGATIVE
Acid Fast Smear: NEGATIVE

## 2024-06-25 NOTE — Telephone Encounter (Signed)
 Appt scheduled

## 2024-06-26 ENCOUNTER — Other Ambulatory Visit (HOSPITAL_BASED_OUTPATIENT_CLINIC_OR_DEPARTMENT_OTHER): Payer: Self-pay | Admitting: Student

## 2024-06-26 DIAGNOSIS — G459 Transient cerebral ischemic attack, unspecified: Secondary | ICD-10-CM

## 2024-06-26 LAB — CULTURE, BAL-QUANTITATIVE W GRAM STAIN
Culture: NO GROWTH
Gram Stain: NONE SEEN

## 2024-06-26 LAB — CYTOLOGY - NON PAP

## 2024-06-27 LAB — FUNGAL STAIN REFLEX

## 2024-06-27 LAB — FUNGUS STAIN

## 2024-06-29 LAB — AEROBIC/ANAEROBIC CULTURE W GRAM STAIN (SURGICAL/DEEP WOUND)
Culture: NO GROWTH
Culture: NO GROWTH
Gram Stain: NONE SEEN
Gram Stain: NONE SEEN

## 2024-07-01 ENCOUNTER — Encounter: Payer: Self-pay | Admitting: Acute Care

## 2024-07-01 ENCOUNTER — Ambulatory Visit: Admitting: Acute Care

## 2024-07-01 VITALS — BP 122/70 | HR 73 | Temp 97.4°F | Ht 70.0 in | Wt 198.0 lb

## 2024-07-01 DIAGNOSIS — Z720 Tobacco use: Secondary | ICD-10-CM

## 2024-07-01 DIAGNOSIS — F17210 Nicotine dependence, cigarettes, uncomplicated: Secondary | ICD-10-CM | POA: Diagnosis not present

## 2024-07-01 DIAGNOSIS — R911 Solitary pulmonary nodule: Secondary | ICD-10-CM | POA: Diagnosis not present

## 2024-07-01 DIAGNOSIS — Z9889 Other specified postprocedural states: Secondary | ICD-10-CM | POA: Diagnosis not present

## 2024-07-01 DIAGNOSIS — R942 Abnormal results of pulmonary function studies: Secondary | ICD-10-CM

## 2024-07-01 DIAGNOSIS — C3492 Malignant neoplasm of unspecified part of left bronchus or lung: Secondary | ICD-10-CM | POA: Diagnosis not present

## 2024-07-01 DIAGNOSIS — R9389 Abnormal findings on diagnostic imaging of other specified body structures: Secondary | ICD-10-CM

## 2024-07-01 DIAGNOSIS — C3490 Malignant neoplasm of unspecified part of unspecified bronchus or lung: Secondary | ICD-10-CM

## 2024-07-01 NOTE — Progress Notes (Signed)
 History of Present Illness Joe Green is a 73 y.o. male current every day smoker with  ILD who is referred for robotic assisted navigational bronchoscopy for biopsies of a lingular nodule that is pet avid along with pet avid hilar and mediastinal lymph nodes.    07/01/2024 Discussed the use of AI scribe software for clinical note transcription with the patient, who gave verbal consent to proceed.  History of Present Illness Pt. Presents for follow up after bronchoscopy on 06/24/2024. Procedure was done by Dr. Kara. Initial intent was for biopsy of the Lingular nodule, as well as EBUS to the enlarged # 10 L node. EBUS had to be aborted due to inability to achieve safe approximation due to proximity with blood vessels .   He underwent a biopsy on November 11th. He states he has done well after the procedure. Minimal bleeding, no fever, infection , discolored secretions, or adverse reaction to anesthesia. He has experienced persistent coughing for the past year and a half, which has improved but remains present.  We have reviewed the biopsy results. The left Lingular biopsy was  confirmed adenocarcinoma, a type of non-small cell lung cancer. We discussed that we will make referral to medical oncology to determine treatment options. PET scan has been completed, so I will order MR Brain to complete staging.    [name]  has a history of interstitial lung disease, identified through high-resolution CT scans, and was previously under care for this condition. Pulmonary function tests from 2023 were reviewed. He quit smoking three weeks ago after a long history of smoking.  He is not using any nicotine  replacement therapy. I will provide him with the free nicotine  replacement number in the event he feels he may need these to help maintain smoking abstinence.   Both the patient and his wife verbalized understanding of the diagnosis, and that we are sending him to medical oncology for  treatment options and eventual plan of care.   Test Results:  Cytology 06/24/2024 A. LUNG, LEFT LINGULA, FINE NEEDLE ASPIRATION:  - Adenocarcinoma       - See comment   COMMENT:   Immunohistochemical stains show the tumor cells are strongly and  diffusely positive for TTF-1 and are negative for p40. The findings are  in keeping with an adenocarcinoma. Controls worked appropriately.    CT Chest 06/04/24 1. Lingular nodule with mediastinal adenopathy, hypermetabolic on recent PET and compatible with primary bronchogenic carcinoma. 2. 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 3. Cirrhosis. 4. Aortic atherosclerosis (ICD10-I70.0). Coronary artery calcification. 5.  Emphysema (ICD10-J43.9).  PFT:     Latest Ref Rng & Units 07/18/2022   10:59 AM 05/24/2021   10:42 AM  PFT Results  FVC-Pre L 4.05  4.15   FVC-Predicted Pre % 93  94   FVC-Post L 4.16  4.24   FVC-Predicted Post % 95  96   Pre FEV1/FVC % % 71  70   Post FEV1/FCV % % 70  72   FEV1-Pre L 2.87  2.90   FEV1-Predicted Pre % 89  89   FEV1-Post L 2.92  3.05   DLCO uncorrected ml/min/mmHg 21.44  19.86   DLCO UNC% % 83  77   DLCO corrected ml/min/mmHg 21.44  19.86   DLCO COR %Predicted % 83  77   DLVA Predicted % 84  77   TLC L 6.77  6.77   TLC % Predicted % 97  97   RV % Predicted %  112          Latest Ref Rng & Units 06/24/2024    6:19 AM 04/22/2024    1:56 PM 01/16/2024    8:22 AM  CBC  WBC 4.0 - 10.5 K/uL 7.8  7.9  9.4   Hemoglobin 13.0 - 17.0 g/dL 85.6  84.8  85.6   Hematocrit 39.0 - 52.0 % 42.5  45.4  43.2   Platelets 150 - 400 K/uL 152  162  171        Latest Ref Rng & Units 06/24/2024    6:19 AM 04/22/2024    1:56 PM 01/16/2024    8:22 AM  BMP  Glucose 70 - 99 mg/dL 796  891  836   BUN 8 - 23 mg/dL 8  8  7    Creatinine 0.61 - 1.24 mg/dL 9.18  9.25  9.17   BUN/Creat Ratio 10 - 24  11  9    Sodium 135 - 145 mmol/L 134  137  139   Potassium 3.5 - 5.1 mmol/L 5.1   4.1  4.2   Chloride 98 - 111 mmol/L 103  101  104   CO2 22 - 32 mmol/L 22  22  20    Calcium  8.9 - 89.6 mg/dL 9.2  9.3  8.9     BNP No results found for: BNP  ProBNP No results found for: PROBNP  PFT    Component Value Date/Time   FEV1PRE 2.87 07/18/2022 1059   FEV1POST 2.92 07/18/2022 1059   FVCPRE 4.05 07/18/2022 1059   FVCPOST 4.16 07/18/2022 1059   TLC 6.77 07/18/2022 1059   DLCOUNC 21.44 07/18/2022 1059   PREFEV1FVCRT 71 07/18/2022 1059   PSTFEV1FVCRT 70 07/18/2022 1059    DG CHEST PORT 1 VIEW Result Date: 06/24/2024 CLINICAL DATA:  Bronchoscopy with biopsy EXAM: PORTABLE CHEST 1 VIEW COMPARISON:  Chest CT performed June 04, 2024 FINDINGS: Low lung volumes. Mild interstitial prominence and peripheral lucency. No pneumothorax or significant pleural effusion. IMPRESSION: 1. Low lung volumes with imaging features of COPD. 2. No plain film evidence of pneumothorax. Electronically Signed   By: Maude Naegeli M.D.   On: 06/24/2024 09:50   DG C-ARM BRONCHOSCOPY Result Date: 06/24/2024 C-ARM BRONCHOSCOPY: Fluoroscopy was utilized by the requesting physician.  No radiographic interpretation.   CT Super D Chest Wo Contrast Result Date: 06/06/2024 CLINICAL DATA:  Lung nodule.  Interstitial lung disease. EXAM: CT CHEST WITHOUT CONTRAST TECHNIQUE: Multidetector CT imaging of the chest was performed using thin slice collimation for electromagnetic bronchoscopy planning purposes, without intravenous contrast. RADIATION DOSE REDUCTION: This exam was performed according to the departmental dose-optimization program which includes automated exposure control, adjustment of the mA and/or kV according to patient size and/or use of iterative reconstruction technique. COMPARISON:  PET 05/26/2024, CT chest 05/09/2024 and 05/25/2022. FINDINGS: Cardiovascular: Atherosclerotic calcification of the aorta and coronary arteries. Heart size normal. No pericardial effusion. Mediastinum/Nodes: Mediastinal  lymph nodes measure up to 1.7 cm in the AP window, unchanged from 05/09/2024 but enlarged from 8 mm on 05/25/2022. Hilar regions are difficult to definitively evaluate without IV contrast. No axillary adenopathy. Esophagus is grossly unremarkable. Lungs/Pleura: Centrilobular and paraseptal emphysema. Peripheral and basilar predominant coarsened ground-glass, subpleural reticular densities and traction bronchiolectasis. Nodular consolidation in the medial lingula measures 1.3 x 1.7 cm (8/100), unchanged from 05/09/2024 and hypermetabolic on 05/26/2024. No pleural fluid. Adherent debris in the airway. Upper Abdomen: Liver margin is irregular. Visualized portions of the liver, adrenal glands, left kidney, spleen, pancreas, stomach  and bowel are otherwise grossly unremarkable. No upper abdominal adenopathy. Musculoskeletal: Degenerative changes in the spine. IMPRESSION: 1. Lingular nodule with mediastinal adenopathy, hypermetabolic on recent PET and compatible with primary bronchogenic carcinoma. 2. 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 3. Cirrhosis. 4. Aortic atherosclerosis (ICD10-I70.0). Coronary artery calcification. 5.  Emphysema (ICD10-J43.9). Electronically Signed   By: Newell Eke M.D.   On: 06/06/2024 08:58     Past medical hx Past Medical History:  Diagnosis Date   Aortic atherosclerosis (HCC)by Chest CT scan 11/2016 12/30/2017   Per chest CT 11/2016     Arthritis    B12 deficiency 04/22/2020   BPH (benign prostatic hyperplasia) 10/05/2014   CAD (coronary artery disease)    a. s/p stent to RCA 2005; b. LHC 2006 after Nuc with inf ischemia: patent RCA stent, D1 80%, LAD 60% => med Rx;  c. LHC 04/18/11: Proximal LAD 60% (unchanged from previous catheterization), distal LAD 65% (small caliber), Dx 60%, proximal CFX 20%, mid CFX 50%, OM1 30%, proximal RCA 20%, mid RCA stent patent, EF 65%.=> med Rx   COPD (chronic obstructive pulmonary disease) (HCC)  10/05/2014   Gallstones 02/21/2021   GERD (gastroesophageal reflux disease)    Hepatic steatosis 02/17/2020   Per CT 02/2020     History of colonic polyps 04/23/2020   HLD (hyperlipidemia)    Hyperlipidemia associated with type 2 diabetes mellitus (HCC) 04/03/2011   Hypertension    ILD (interstitial lung disease) (HCC) 02/21/2021   Suggested by CT 02/2021 - pulmonology referral placed     Mini stroke 08/2023   [facility]   Obesity    OSA (obstructive sleep apnea)    has a cpap-does not use-has a special mouthpiece he wears at night   RLS (restless legs syndrome) 10/05/2014   Type 2 diabetes mellitus (HCC) 06/05/2014   Type II or unspecified type diabetes mellitus without mention of complication, not stated as uncontrolled    Vitamin D  deficiency    Wears glasses      Social History   Tobacco Use   Smoking status: Former    Current packs/day: 0.00    Average packs/day: 0.3 packs/day for 55.8 years (14.0 ttl pk-yrs)    Types: Cigarettes    Start date: 89    Quit date: 06/16/2024    Years since quitting: 0.0    Passive exposure: Current   Smokeless tobacco: Never  Vaping Use   Vaping status: Never Used  Substance Use Topics   Alcohol use: Not Currently    Comment: 14 a week   Drug use: No    Mr.Vanscyoc reports that he quit smoking about 2 weeks ago. His smoking use included cigarettes. He started smoking about 55 years ago. He has a 14 pack-year smoking history. He has been exposed to tobacco smoke. He has never used smokeless tobacco. He reports that he does not currently use alcohol. He reports that he does not use drugs.  Tobacco Cessation: Counseling given: Not Answered Former smoker with a 20 + pack year smoking history  Past surgical hx, Family hx, Social hx all reviewed.  Current Outpatient Medications on File Prior to Visit  Medication Sig   ACCU-CHEK GUIDE TEST test strip 1 each 3 (three) times daily.   Accu-Chek Softclix Lancets lancets  SMARTSIG:Topical   acetaminophen  (TYLENOL ) 500 MG tablet Take 2 tablets (1,000 mg total) by mouth every 6 (six) hours.   albuterol  (VENTOLIN  HFA) 108 (90 Base) MCG/ACT inhaler Inhale 2 puffs into the  lungs every 6 (six) hours as needed for wheezing or shortness of breath.   Blood Glucose Monitoring Suppl DEVI 1 each by Does not apply route in the morning, at noon, and at bedtime. May substitute to any manufacturer covered by patient's insurance.   Cinnamon 500 MG TABS Take 500 mg by mouth daily.   clopidogrel  (PLAVIX ) 75 MG tablet Take 1 tablet by mouth once daily   doxazosin  (CARDURA ) 8 MG tablet Take 1 tablet by mouth at Bedtime for BP & Prostate.   Dulaglutide  ([name] ) 3 MG/0.5ML SOPN Inject 3 mg as directed once a week.   fexofenadine (ALLEGRA) 60 MG tablet Take 60 mg by mouth daily as needed for allergies or rhinitis.   finasteride  (PROSCAR ) 5 MG tablet TAKE 1 TABLET BY MOUTH ONCE DAILY FOR PROSTATE   fluticasone  furoate-vilanterol (BREO ELLIPTA ) 100-25 MCG/ACT AEPB Use 1 inhalation Daily for COPD / Asthma   glimepiride  (AMARYL ) 4 MG tablet TAKE 1/2 TO 1 (ONE-HALF TO ONE) TABLET BY MOUTH ONCE DAILY WITH SUPPER FOR DIABETES   metFORMIN  (GLUCOPHAGE -XR) 500 MG 24 hr tablet On week 1 take 2 tablets daily with breakfast (1,000 mg daily total), THEN on week 2 take 2 tablets with breakfast and 1 tablet with dinner (1,500 mg daily total), THEN on week 3 take 2 tablets daily with breakfast and 2 tablets daily with dinner (2,000 mg daily total). You will continue 4 tablets daily thereafter.   nicotine  (NICODERM CQ  - DOSED IN MG/24 HOURS) 14 mg/24hr patch Place 1 patch (14 mg total) onto the skin daily.   nitroGLYCERIN  (NITROSTAT ) 0.4 MG SL tablet Place 1 tablet (0.4 mg total) under the tongue every 5 (five) minutes as needed for chest pain.   pramipexole  (MIRAPEX ) 1 MG tablet 1-2 tabs at bedtime   ramipril  (ALTACE ) 5 MG capsule TAKE 1 CAPSULE BY MOUTH ONCE DAILY FOR  BLOOD  PRESSURE  AND  DIABETIC   KIDNEY  PROTECTION   rosuvastatin  (CRESTOR ) 20 MG tablet TAKE 1 TABLET BY MOUTH THREE TIMES A WEEK FOR CHOLESTEROL   Current Facility-Administered Medications on File Prior to Visit  Medication   regadenoson  (LEXISCAN ) injection SOLN 0.4 mg   technetium tetrofosmin  (TC-MYOVIEW ) injection 32.3 millicurie     No Known Allergies  Review Of Systems:  Constitutional:   No  weight loss, night sweats,  Fevers, chills, fatigue, or  lassitude.  HEENT:   No headaches,  Difficulty swallowing,  Tooth/dental problems, or  Sore throat,                No sneezing, itching, ear ache, nasal congestion, post nasal drip,   CV:  No chest pain,  Orthopnea, PND, swelling in lower extremities, anasarca, dizziness, palpitations, syncope.   GI  No heartburn, indigestion, abdominal pain, nausea, vomiting, diarrhea, change in bowel habits, loss of appetite, bloody stools.   Resp: + shortness of breath with exertion less at rest.  No excess mucus, no productive cough,  No non-productive cough,  No coughing up of blood.  No change in color of mucus.  No wheezing.  No chest wall deformity  Skin: no rash or lesions.  GU: no dysuria, change in color of urine, no urgency or frequency.  No flank pain, no hematuria   MS:  No joint pain or swelling.  No decreased range of motion.  No back pain.  Psych:  No change in mood or affect. No depression or anxiety.  No memory loss.   Vital Signs BP 122/70  Pulse 73   Temp (!) 97.4 F (36.3 C) (Oral)   Ht 5' 10 (1.778 m)   Wt 198 lb (89.8 kg)   SpO2 94%   BMI 28.41 kg/m    Physical Exam:  Physical Exam GENERAL: No distress, alert and oriented times 3. EARS NOSE THROAT: No sinus tenderness, tympanic membranes clear, pale nasal mucosa, no oral exudate, no post nasal drip, no lymphadenopathy. CHEST: Lungs clear to auscultation bilaterally. No wheeze, rales, dullness, no accessory muscle use, no nasal flaring, no sternal retractions. CARDIAC: S1, S2, regular  rate and rhythm, no murmur. ABDOMINAL: Soft, non tender. ND, BS present, EXTREMITIES: No clubbing, cyanosis, edema. No obvious deformities. NEUROLOGICAL: Normal strength. Alert and oriented x 3, MAE x 4. SKIN: No rashes, warm and dry. No obvious skin lesions. PSYCHIATRIC: Normal mood and behavior.     Assessment and Plan Assessment & Plan Biopsy proven Adenocarcinoma of left upper lobe of lung PET scan indicates possible lymph node involvement.  No distant metastasis.  - Referred to medical oncology for treatment planning and molecular/genetic testing. - Ordered MRI of the brain for staging. - Smoking cessation support resources provided   Chronic cough Persisting for 1.5 years, improved post-biopsy.  No infection or fever.  Possible relation to interstitial lung disease or cancer diagnosis Plan Monitor    I spent 25 minutes dedicated to the care of this patient on the date of this encounter to include pre-visit review of records, face-to-face time with the patient discussing conditions above, post visit ordering of testing, clinical documentation with the electronic health record, making appropriate referrals as documented, and communicating necessary information to the patient's healthcare team.    Lauraine JULIANNA Lites, NP 07/01/2024  9:56 AM

## 2024-07-01 NOTE — Patient Instructions (Addendum)
 It is good to see you today. I am glad you have done well after your biopsy. We have reviewed your biopsy results. The left lingular biopsy was positive for adenocarcinoma of the lung. This is  a non small cell lung cancer.  I have referred you to the medical oncology center at [facility]( [facility]) You will get a call to get the appointment scheduled.  I have ordered an MRI of the brain to complete staging. You will get a call to get this scheduled. I am proud of you for quitting smoking. Keep up the strong work.  1-800 QUIT NOW  for nicotine  replacement gum. [facility] with your treatment. They will take great care of you at the cancer center.  Please contact office for sooner follow up if symptoms do not improve or worsen or seek emergency care

## 2024-07-03 ENCOUNTER — Other Ambulatory Visit: Payer: Self-pay

## 2024-07-03 NOTE — Progress Notes (Signed)
 The proposed treatment discussed in conference is for discussion purpose only and is not a binding recommendation.  The patients have not been physically examined, or presented with their treatment options.  Therefore, final treatment plans cannot be decided.

## 2024-07-05 ENCOUNTER — Ambulatory Visit
Admission: RE | Admit: 2024-07-05 | Discharge: 2024-07-05 | Disposition: A | Discharge status: Home / Self Care | Source: Ambulatory Visit | Attending: Acute Care | Admitting: Acute Care

## 2024-07-05 DIAGNOSIS — C3490 Malignant neoplasm of unspecified part of unspecified bronchus or lung: Secondary | ICD-10-CM

## 2024-07-05 MED ORDER — GADOPICLENOL 0.5 MMOL/ML IV SOLN
9.0000 mL | Freq: Once | INTRAVENOUS | Status: AC | PRN
  Administered 2024-07-05: 9 mL via INTRAVENOUS

## 2024-07-08 ENCOUNTER — Other Ambulatory Visit: Payer: Self-pay

## 2024-07-08 DIAGNOSIS — R911 Solitary pulmonary nodule: Secondary | ICD-10-CM

## 2024-07-09 ENCOUNTER — Inpatient Hospital Stay: Attending: Internal Medicine | Admitting: Internal Medicine

## 2024-07-09 ENCOUNTER — Inpatient Hospital Stay

## 2024-07-09 VITALS — BP 113/56 | HR 69 | Temp 97.8°F | Resp 18 | Ht 70.0 in | Wt 197.0 lb

## 2024-07-09 DIAGNOSIS — Z803 Family history of malignant neoplasm of breast: Secondary | ICD-10-CM | POA: Diagnosis not present

## 2024-07-09 DIAGNOSIS — R590 Localized enlarged lymph nodes: Secondary | ICD-10-CM | POA: Insufficient documentation

## 2024-07-09 DIAGNOSIS — Z8042 Family history of malignant neoplasm of prostate: Secondary | ICD-10-CM | POA: Diagnosis not present

## 2024-07-09 DIAGNOSIS — Z8051 Family history of malignant neoplasm of kidney: Secondary | ICD-10-CM | POA: Diagnosis not present

## 2024-07-09 DIAGNOSIS — C3412 Malignant neoplasm of upper lobe, left bronchus or lung: Secondary | ICD-10-CM | POA: Diagnosis not present

## 2024-07-09 DIAGNOSIS — Z87891 Personal history of nicotine dependence: Secondary | ICD-10-CM | POA: Diagnosis not present

## 2024-07-09 DIAGNOSIS — R911 Solitary pulmonary nodule: Secondary | ICD-10-CM

## 2024-07-09 DIAGNOSIS — E119 Type 2 diabetes mellitus without complications: Secondary | ICD-10-CM | POA: Diagnosis not present

## 2024-07-09 LAB — CMP (CANCER CENTER ONLY)
ALT: 42 U/L (ref 0–44)
AST: 22 U/L (ref 15–41)
Albumin: 4.2 g/dL (ref 3.5–5.0)
Alkaline Phosphatase: 107 U/L (ref 38–126)
Anion gap: 9 (ref 5–15)
BUN: 10 mg/dL (ref 8–23)
CO2: 25 mmol/L (ref 22–32)
Calcium: 9.2 mg/dL (ref 8.9–10.3)
Chloride: 101 mmol/L (ref 98–111)
Creatinine: 0.84 mg/dL (ref 0.61–1.24)
GFR, Estimated: >60 mL/min (ref >60)
Glucose, Bld: 214 mg/dL — ABNORMAL HIGH (ref 70–99)
Potassium: 4 mmol/L (ref 3.5–5.1)
Sodium: 135 mmol/L (ref 135–145)
Total Bilirubin: 0.7 mg/dL (ref 0.0–1.2)
Total Protein: 6.8 g/dL (ref 6.5–8.1)

## 2024-07-09 LAB — CBC WITH DIFFERENTIAL (CANCER CENTER ONLY)
Abs Immature Granulocytes: 0.02 K/uL (ref 0.00–0.07)
Basophils Absolute: 0 K/uL (ref 0.0–0.1)
Basophils Relative: 1 %
Eosinophils Absolute: 0.3 K/uL (ref 0.0–0.5)
Eosinophils Relative: 4 %
HCT: 41.1 % (ref 39.0–52.0)
Hemoglobin: 14.2 g/dL (ref 13.0–17.0)
Immature Granulocytes: 0 %
Lymphocytes Relative: 22 %
Lymphs Abs: 1.7 K/uL (ref 0.7–4.0)
MCH: 30.9 pg (ref 26.0–34.0)
MCHC: 34.5 g/dL (ref 30.0–36.0)
MCV: 89.3 fL (ref 80.0–100.0)
Monocytes Absolute: 0.7 K/uL (ref 0.1–1.0)
Monocytes Relative: 8 %
Neutro Abs: 5.3 K/uL (ref 1.7–7.7)
Neutrophils Relative %: 65 %
Platelet Count: 155 K/uL (ref 150–400)
RBC: 4.6 MIL/uL (ref 4.22–5.81)
RDW: 12 % (ref 11.5–15.5)
WBC Count: 8.1 K/uL (ref 4.0–10.5)
nRBC: 0 % (ref 0.0–0.2)

## 2024-07-09 NOTE — Progress Notes (Signed)
 [facility] [phone]   [phone]  CONSULT NOTE  REFERRING PHYSICIAN: Dr. Dorn Chill  REASON FOR CONSULTATION:  73 years old white male recently diagnosed with lung cancer  HPI Joe Green is a 73 y.o. male.   HPI  Discussed the use of AI scribe software for clinical note transcription with the patient, who gave verbal consent to proceed.  History of Present Illness Joe Green is a 73 year old male with recently diagnosed lung cancer who presents for evaluation. He is accompanied by his wife, [name]. He was referred by a pulmonologist after a CT scan showed suspicious nodules.  He was diagnosed with lung cancer following a routine CT scan on May 11, 2024, which was part of a screening program due to his history of smoking. The CT scan revealed a suspicious nodule, leading to a PET scan on May 26, 2024, which showed activity in the left hilar and aortopulmonary window lymph nodes. A bronchoscopy with biopsy performed on June 24, 2024, confirmed adenocarcinoma, a type of non-small cell lung cancer. An MRI of the brain was conducted recently, but results are pending.  He reports no chest pain, breathing issues, or weight loss, although he continues to experience a hard cough. No hemoptysis, nausea, vomiting, diarrhea, or headaches.  His past medical history includes diabetes, high cholesterol, interstitial lung disease, obstructive sleep apnea, and a history of TIA. He takes blood pressure medication to protect his kidneys from diabetes-related damage, although he denies having high blood pressure.  His family history is significant for cancer, with his father having died from prostate cancer and two sisters currently diagnosed with cancer. His mother had cancer, but the type is unknown.  Socially, he has a history of smoking since 1972 but has recently quit. He consumes two to three beers daily and denies any  use of street drugs. He worked as a civil engineer, contracting.     Past Medical History:  Diagnosis Date   Aortic atherosclerosis (HCC)by Chest CT scan 11/2016 12/30/2017   Per chest CT 11/2016     Arthritis    B12 deficiency 04/22/2020   BPH (benign prostatic hyperplasia) 10/05/2014   CAD (coronary artery disease)    a. s/p stent to RCA 2005; b. LHC 2006 after Nuc with inf ischemia: patent RCA stent, D1 80%, LAD 60% => med Rx;  c. LHC 04/18/11: Proximal LAD 60% (unchanged from previous catheterization), distal LAD 65% (small caliber), Dx 60%, proximal CFX 20%, mid CFX 50%, OM1 30%, proximal RCA 20%, mid RCA stent patent, EF 65%.=> med Rx   COPD (chronic obstructive pulmonary disease) (HCC) 10/05/2014   Gallstones 02/21/2021   GERD (gastroesophageal reflux disease)    Hepatic steatosis 02/17/2020   Per CT 02/2020     History of colonic polyps 04/23/2020   HLD (hyperlipidemia)    Hyperlipidemia associated with type 2 diabetes mellitus (HCC) 04/03/2011   Hypertension    ILD (interstitial lung disease) (HCC) 02/21/2021   Suggested by CT 02/2021 - pulmonology referral placed     Mini stroke 08/2023   [facility] hospital   Obesity    OSA (obstructive sleep apnea)    has a cpap-does not use-has a special mouthpiece he wears at night   RLS (restless legs syndrome) 10/05/2014   Type 2 diabetes mellitus (HCC) 06/05/2014   Type II or unspecified type diabetes mellitus without mention of complication, not stated as uncontrolled    Vitamin D  deficiency  Wears glasses       Past Surgical History:  Procedure Laterality Date   CARDIAC CATHETERIZATION  2005   stent   CHOLECYSTECTOMY N/A 10/09/2023   Procedure: LAPAROSCOPIC CHOLECYSTECTOMY WITH INDOCYANINE GREEN  DYE;  Surgeon: Signe Mitzie LABOR, MD;  Location: [facility];  Service: General;  Laterality: N/A;   CHONDROPLASTY  06/25/2017   Procedure: CHONDROPLASTY;  Surgeon: Yvone Rush, MD;  Location: [facility];  Service: Orthopedics;;    COLONOSCOPY  11/2011   Due 5 years Dr. Aneita   heart stent  2005   RCA   INGUINAL HERNIA REPAIR Right 11/16/2014   Procedure: RIGHT INGUINAL HERNIA REPAIR WITH MESH;  Surgeon: Krystal Spinner, MD;  Location: [facility];  Service: General;  Laterality: Right;   INSERTION OF MESH Right 11/16/2014   Procedure: INSERTION OF MESH;  Surgeon: Krystal Spinner, MD;  Location: [facility];  Service: General;  Laterality: Right;   KNEE ARTHROSCOPY WITH MEDIAL MENISECTOMY Bilateral 06/25/2017   Procedure: right knee arthroscopy with medial meniscectomy, chondroplasty, Left knee arthroscopy with medial menisectomy removal of plica and chondroplasty;  Surgeon: Yvone Rush, MD;  Location: [facility];  Service: Orthopedics;  Laterality: Bilateral;   POLYPECTOMY     VIDEO BRONCHOSCOPY WITH ENDOBRONCHIAL NAVIGATION Left 06/24/2024   Procedure: VIDEO BRONCHOSCOPY WITH ENDOBRONCHIAL NAVIGATION;  Surgeon: Kara Dorn NOVAK, MD;  Location: [facility];  Service: Pulmonary;  Laterality: Left;   VIDEO BRONCHOSCOPY WITH ENDOBRONCHIAL ULTRASOUND Bilateral 06/24/2024   Procedure: BRONCHOSCOPY, WITH EBUS;  Surgeon: Kara Dorn NOVAK, MD;  Location: [facility];  Service: Pulmonary;  Laterality: Bilateral;    Family History  Problem Relation Age of Onset   Vaginal cancer Mother        vaginal?    Prostate cancer Father    Hypertension Father    Breast cancer Sister    Breast cancer Sister    Kidney cancer Maternal Grandmother    Colon cancer Neg Hx    Colon polyps Neg Hx    Esophageal cancer Neg Hx    Rectal cancer Neg Hx    Stomach cancer Neg Hx     Social History Social History   Tobacco Use   Smoking status: Former    Current packs/day: 0.00    Average packs/day: 0.3 packs/day for 55.8 years (14.0 ttl pk-yrs)    Types: Cigarettes    Start date: 24    Quit date: 06/16/2024    Years since quitting: 0.0    Passive exposure: Current   Smokeless tobacco: Never   Vaping Use   Vaping status: Never Used  Substance Use Topics   Alcohol use: Not Currently    Comment: 14 a week   Drug use: No    No Known Allergies  Current Outpatient Medications  Medication Sig Dispense Refill   ACCU-CHEK GUIDE TEST test strip 1 each 3 (three) times daily.     Accu-Chek Softclix Lancets lancets SMARTSIG:Topical     acetaminophen  (TYLENOL ) 500 MG tablet Take 2 tablets (1,000 mg total) by mouth every 6 (six) hours. 30 tablet 0   albuterol  (VENTOLIN  HFA) 108 (90 Base) MCG/ACT inhaler Inhale 2 puffs into the lungs every 6 (six) hours as needed for wheezing or shortness of breath. 8 g 2   Blood Glucose Monitoring Suppl DEVI 1 each by Does not apply route in the morning, at noon, and at bedtime. May substitute to any manufacturer covered by patient's insurance. 1 each 0   Cinnamon 500  MG TABS Take 500 mg by mouth daily.     clopidogrel  (PLAVIX ) 75 MG tablet Take 1 tablet by mouth once daily 90 tablet 3   doxazosin  (CARDURA ) 8 MG tablet Take 1 tablet by mouth at Bedtime for BP & Prostate. 90 tablet 1   Dulaglutide  ([name] ) 3 MG/0.5ML SOPN Inject 3 mg as directed once a week. 2 mL 3   fexofenadine (ALLEGRA) 60 MG tablet Take 60 mg by mouth daily as needed for allergies or rhinitis.     finasteride  (PROSCAR ) 5 MG tablet TAKE 1 TABLET BY MOUTH ONCE DAILY FOR PROSTATE 90 tablet 1   fluticasone  furoate-vilanterol (BREO ELLIPTA ) 100-25 MCG/ACT AEPB Use 1 inhalation Daily for COPD / Asthma 60 each 3   glimepiride  (AMARYL ) 4 MG tablet TAKE 1/2 TO 1 (ONE-HALF TO ONE) TABLET BY MOUTH ONCE DAILY WITH SUPPER FOR DIABETES 90 tablet 2   metFORMIN  (GLUCOPHAGE -XR) 500 MG 24 hr tablet On week 1 take 2 tablets daily with breakfast (1,000 mg daily total), THEN on week 2 take 2 tablets with breakfast and 1 tablet with dinner (1,500 mg daily total), THEN on week 3 take 2 tablets daily with breakfast and 2 tablets daily with dinner (2,000 mg daily total). You will continue 4 tablets daily  thereafter. 120 tablet 6   nicotine  (NICODERM CQ  - DOSED IN MG/24 HOURS) 14 mg/24hr patch Place 1 patch (14 mg total) onto the skin daily. 28 patch 0   nitroGLYCERIN  (NITROSTAT ) 0.4 MG SL tablet Place 1 tablet (0.4 mg total) under the tongue every 5 (five) minutes as needed for chest pain. 25 tablet 3   pramipexole  (MIRAPEX ) 1 MG tablet 1-2 tabs at bedtime 180 tablet 2   ramipril  (ALTACE ) 5 MG capsule TAKE 1 CAPSULE BY MOUTH ONCE DAILY FOR  BLOOD  PRESSURE  AND  DIABETIC  KIDNEY  PROTECTION 90 capsule 1   rosuvastatin  (CRESTOR ) 20 MG tablet TAKE 1 TABLET BY MOUTH THREE TIMES A WEEK FOR CHOLESTEROL 30 tablet 6   No current facility-administered medications for this visit.   Facility-Administered Medications Ordered in Other Visits  Medication Dose Route Frequency Provider Last Rate Last Admin   regadenoson  (LEXISCAN ) injection SOLN 0.4 mg  0.4 mg Intravenous Once Crenshaw, Brian S, MD       technetium tetrofosmin  (TC-MYOVIEW ) injection 32.3 millicurie  32.3 millicurie Intravenous Once PRN Pietro Redell RAMAN, MD        Review of Systems  Constitutional: positive for fatigue Eyes: negative Ears, nose, mouth, throat, and face: negative Respiratory: positive for cough Cardiovascular: negative Gastrointestinal: negative Genitourinary:negative Integument/breast: negative Hematologic/lymphatic: negative Musculoskeletal:positive for arthralgias Neurological: negative Behavioral/Psych: negative Endocrine: negative Allergic/Immunologic: negative  Physical Exam  Joe Green, healthy, no distress, well nourished, and well developed SKIN: skin color, texture, turgor are normal, no rashes or significant lesions HEAD: Normocephalic, No masses, lesions, tenderness or abnormalities EYES: normal, PERRLA, Conjunctiva are pink and non-injected EARS: External ears normal, Canals clear OROPHARYNX:no exudate, no erythema, and lips, buccal mucosa, and tongue normal  NECK: supple, no adenopathy, no  JVD LYMPH:  no palpable lymphadenopathy, no hepatosplenomegaly LUNGS: clear to auscultation , and palpation HEART: regular rate & rhythm, no murmurs, and no gallops ABDOMEN:abdomen soft, non-tender, normal bowel sounds, and no masses or organomegaly BACK: Back symmetric, no curvature., No CVA tenderness EXTREMITIES:no joint deformities, effusion, or inflammation, no edema  NEURO: alert & oriented x 3 with fluent speech, no focal motor/sensory deficits  PERFORMANCE STATUS: ECOG 1  LABORATORY DATA: Lab Results  Component  Value Date   WBC 7.8 06/24/2024   HGB 14.3 06/24/2024   HCT 42.5 06/24/2024   MCV 93.4 06/24/2024   PLT 152 06/24/2024      Chemistry      Component Value Date/Time   NA 134 (L) 06/24/2024 0619   NA 137 04/22/2024 1356   K 5.1 06/24/2024 0619   CL 103 06/24/2024 0619   CO2 22 06/24/2024 0619   BUN 8 06/24/2024 0619   BUN 8 04/22/2024 1356   CREATININE 0.81 06/24/2024 0619   CREATININE 0.79 08/30/2023 1404      Component Value Date/Time   CALCIUM  9.2 06/24/2024 0619   ALKPHOS 122 (H) 04/22/2024 1356   AST 12 04/22/2024 1356   ALT 20 04/22/2024 1356   BILITOT 0.5 04/22/2024 1356       RADIOGRAPHIC STUDIES: DG CHEST PORT 1 VIEW Result Date: 06/24/2024 CLINICAL DATA:  Bronchoscopy with biopsy EXAM: PORTABLE CHEST 1 VIEW COMPARISON:  Chest CT performed June 04, 2024 FINDINGS: Low lung volumes. Mild interstitial prominence and peripheral lucency. No pneumothorax or significant pleural effusion. IMPRESSION: 1. Low lung volumes with imaging features of COPD. 2. No plain film evidence of pneumothorax. Electronically Signed   By: Maude Naegeli M.D.   On: 06/24/2024 09:50   DG C-ARM BRONCHOSCOPY Result Date: 06/24/2024 C-ARM BRONCHOSCOPY: Fluoroscopy was utilized by the requesting physician.  No radiographic interpretation.    ASSESSMENT: This is a very pleasant 73 years old white male with stage IIb (T1b, N2a, M0) non-small cell lung cancer, adenocarcinoma  presented with left lingular nodule in addition to left hilar and AP window lymphadenopathy diagnosed in November 2025. Molecular studies and PD-L1 expression are still pending  PLAN: I had a lengthy discussion with the patient and his wife today about his current disease stage, prognosis and treatment options.  I personally independently reviewed the scan images and discussed the results with the patient and his wife. Assessment and Plan Assessment & Plan Non-small cell lung cancer, left upper lobe, stage IIB Non-small cell lung cancer, adenocarcinoma type, located in the left upper lobe with involvement of left hilar and AB1 lymph nodes. Stage IIB based on current imaging and biopsy results. Awaiting MRI brain results to rule out metastasis. Treatment options depend on pulmonary and cardiac status. If pulmonary and cardiac status are adequate, neoadjuvant chemotherapy and immunotherapy followed by surgery is preferred. If not, concurrent chemotherapy and radiation therapy is considered. Chemotherapy is typically administered weekly, and radiation therapy is given Monday through Friday for six and a half weeks. Hair loss is not expected with the chemotherapy regimen for this type of cancer. - Ordered pulmonary function test - Referred to surgeon for evaluation of surgical candidacy - Await MRI brain results - If surgical candidacy is confirmed, will initiate neoadjuvant chemotherapy and immunotherapy - If surgical candidacy is not confirmed, will initiate concurrent chemotherapy and radiation therapy - Ensure completion of cataract surgery and dental procedures before starting cancer treatment  Type 2 diabetes mellitus Managed with medication to protect renal function. - Continue current diabetes management regimen He was advised to call immediately if he has any other concerning symptoms in the interval.  The patient voices understanding of current disease status and treatment options and is in  agreement with the current care plan.  All questions were answered. The patient knows to call the clinic with any problems, questions or concerns. We can certainly see the patient much sooner if necessary.  Thank you so much for allowing me  to participate in the care of Joe Green. I will continue to follow up the patient with you and assist in his care.   Disclaimer: This note was dictated with voice recognition software. Similar sounding words can inadvertently be transcribed and may not be corrected upon review.   Joe Green Joe July 09, 2024, 7:50 AM

## 2024-07-16 ENCOUNTER — Telehealth: Payer: Self-pay

## 2024-07-16 NOTE — Telephone Encounter (Signed)
 Spoke with patient regarding scheduling of his pulmonary function test and referral to cardiothoracic surgery. Patient reports he has not yet received a call to schedule either appointment.  Contacted [facility] at [phone] and spoke with Niels and patient will be contacted to schedule Pulmonary function test.  Contacted Cardiothoracic Surgery at [phone] and left a message with Devere, new patient coordinator, inquiring about the referral and scheduling. Requested a return call with any questions.

## 2024-07-17 NOTE — Telephone Encounter (Signed)
Received patient portion of application.

## 2024-07-21 ENCOUNTER — Inpatient Hospital Stay (HOSPITAL_COMMUNITY): Admission: RE | Admit: 2024-07-21 | Discharge: 2024-07-21 | Attending: Internal Medicine

## 2024-07-21 DIAGNOSIS — C3412 Malignant neoplasm of upper lobe, left bronchus or lung: Secondary | ICD-10-CM

## 2024-07-21 LAB — PULMONARY FUNCTION TEST
DL/VA % pred: 81 %
DL/VA: 3.28 ml/min/mmHg/L
DLCO cor % pred: 74 %
DLCO cor: 19.12 ml/min/mmHg
DLCO unc % pred: 73 %
DLCO unc: 18.9 ml/min/mmHg
FEF 25-75 Post: 1.26 L/s
FEF 25-75 Pre: 1.02 L/s
FEF2575-%Change-Post: 23 %
FEF2575-%Pred-Post: 53 %
FEF2575-%Pred-Pre: 42 %
FEV1-%Change-Post: 5 %
FEV1-%Pred-Post: 75 %
FEV1-%Pred-Pre: 71 %
FEV1-Post: 2.4 L
FEV1-Pre: 2.29 L
FEV1FVC-%Change-Post: 0 %
FEV1FVC-%Pred-Pre: 86 %
FEV6-%Change-Post: 3 %
FEV6-%Pred-Post: 86 %
FEV6-%Pred-Pre: 83 %
FEV6-Post: 3.54 L
FEV6-Pre: 3.4 L
FEV6FVC-%Change-Post: 0 %
FEV6FVC-%Pred-Post: 99 %
FEV6FVC-%Pred-Pre: 99 %
FVC-%Change-Post: 4 %
FVC-%Pred-Post: 86 %
FVC-%Pred-Pre: 83 %
FVC-Post: 3.78 L
FVC-Pre: 3.62 L
Post FEV1/FVC ratio: 64 %
Post FEV6/FVC ratio: 94 %
Pre FEV1/FVC ratio: 63 %
Pre FEV6/FVC Ratio: 94 %
RV % pred: 162 %
RV: 4.05 L
TLC % pred: 109 %
TLC: 7.72 L

## 2024-07-21 MED ORDER — ALBUTEROL SULFATE (2.5 MG/3ML) 0.083% IN NEBU
2.5000 mg | INHALATION_SOLUTION | Freq: Once | RESPIRATORY_TRACT | Status: AC
  Administered 2024-07-21: 2.5 mg via RESPIRATORY_TRACT

## 2024-07-22 ENCOUNTER — Encounter (HOSPITAL_BASED_OUTPATIENT_CLINIC_OR_DEPARTMENT_OTHER): Payer: Self-pay | Admitting: Student

## 2024-07-22 ENCOUNTER — Ambulatory Visit (INDEPENDENT_AMBULATORY_CARE_PROVIDER_SITE_OTHER): Admitting: Student

## 2024-07-22 VITALS — BP 126/74 | HR 59 | Temp 97.7°F | Resp 16 | Ht 70.0 in | Wt 197.8 lb

## 2024-07-22 DIAGNOSIS — I2510 Atherosclerotic heart disease of native coronary artery without angina pectoris: Secondary | ICD-10-CM

## 2024-07-22 DIAGNOSIS — Z7984 Long term (current) use of oral hypoglycemic drugs: Secondary | ICD-10-CM | POA: Diagnosis not present

## 2024-07-22 DIAGNOSIS — E785 Hyperlipidemia, unspecified: Secondary | ICD-10-CM | POA: Diagnosis not present

## 2024-07-22 DIAGNOSIS — E1159 Type 2 diabetes mellitus with other circulatory complications: Secondary | ICD-10-CM

## 2024-07-22 DIAGNOSIS — N182 Chronic kidney disease, stage 2 (mild): Secondary | ICD-10-CM | POA: Diagnosis not present

## 2024-07-22 DIAGNOSIS — J849 Interstitial pulmonary disease, unspecified: Secondary | ICD-10-CM

## 2024-07-22 DIAGNOSIS — E1169 Type 2 diabetes mellitus with other specified complication: Secondary | ICD-10-CM | POA: Diagnosis not present

## 2024-07-22 DIAGNOSIS — E1122 Type 2 diabetes mellitus with diabetic chronic kidney disease: Secondary | ICD-10-CM

## 2024-07-22 DIAGNOSIS — I10 Essential (primary) hypertension: Secondary | ICD-10-CM | POA: Diagnosis not present

## 2024-07-22 DIAGNOSIS — Z8673 Personal history of transient ischemic attack (TIA), and cerebral infarction without residual deficits: Secondary | ICD-10-CM | POA: Diagnosis not present

## 2024-07-22 DIAGNOSIS — C3412 Malignant neoplasm of upper lobe, left bronchus or lung: Secondary | ICD-10-CM

## 2024-07-22 MED ORDER — NITROGLYCERIN 0.4 MG SL SUBL: 0.4000 mg | SUBLINGUAL_TABLET | SUBLINGUAL | 3 refills | Status: AC | PRN

## 2024-07-22 NOTE — Patient Instructions (Addendum)
 It was nice to see you today!  Please make sure to get your shingles vaccine on Friday.  If you have any problems before your next visit feel free to message me via MyChart (minor issues or questions) or call the office, otherwise you may reach out to schedule an office visit.  Thank you! Soma Bachand, PA-C

## 2024-07-22 NOTE — Progress Notes (Signed)
 Established Patient Office Visit  Subjective   Patient ID: Joe Green, male    DOB: February 16, 1951  Age: 73 y.o. MRN: [id number]  Chief Complaint  Patient presents with   Medical Management of Chronic Issues    Follow up.  Having left eye cataract surgery Thursday.     HPI  Discussed the use of AI scribe software for clinical note transcription with the patient, who gave verbal consent to proceed.  History of Present Illness   Joe Green is a 73 year old male who presents for diabetes management and pre-operative evaluation.  He is primarily here for diabetes management. His blood sugar levels are expected to be high today as he has stopped taking [name]  one week prior to his upcoming cataract surgery scheduled for Thursday. His last A1c, taken three months ago, was 7.8. He is currently taking metformin  twice daily but is hesitant to increase the dose due to personal dislike of the medication. He is also on Amaryl  (glimepiride ), which he takes as a whole tablet before bed. He is on [name]  3 mg weekly and has discussed the possibility of increasing to 4.5 mg.  He has a history of a mini-stroke and is currently on Plavix  as part of his antiplatelet therapy. He recalls being advised to take it for six months following a telehealth neuro consult after his hospital visit for the mini-stroke.  He is managing his cholesterol with Crestor , which he takes three times per week, and supplements with over-the-counter medication. His last cholesterol levels were reportedly good.  He is undergoing evaluation and treatment planning for a lung condition with an oncologist and a pulmonary specialist. He recently had a breathing test and a brain scan, both of which were satisfactory. He is scheduled to see a surgeon on Friday to discuss potential surgical intervention for his lung condition.  He had three teeth pulled before Christmas and is planning further dental work. He also  mentions a past episode of shingles and is considering the shingles vaccine, although he is unsure about the timing due to his upcoming surgery.  He is due for a colonoscopy but has postponed it due to his current medical issues, including the lung condition and upcoming eye surgery.      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) 07/09/2024   Lung nodule 05/28/2024   Senile purpura 01/09/2024   History of TIA (transient ischemic attack) 01/09/2024   Influenza A 10/12/2023   ILD (interstitial lung disease) (HCC) 02/21/2021   Gallstones 02/21/2021   History of colonic polyps 04/23/2020   Excessive drinking alcohol 04/23/2020   B12 deficiency 04/22/2020   Hepatic steatosis 02/17/2020   Overweight (BMI 25.0-29.9) 12/30/2017   Aortic atherosclerosis (HCC)by Chest CT scan 11/2016 12/30/2017   Chondromalacia, left knee 06/25/2017   Chondromalacia, right knee 06/25/2017   Elevated PSA 11/18/2015   RLS (restless legs syndrome) 10/05/2014   COPD (chronic obstructive pulmonary disease) (HCC) 10/05/2014   BPH (benign prostatic hyperplasia) 10/05/2014   Type 2 diabetes mellitus (HCC) 06/05/2014   Vitamin D  deficiency 04/01/2014   Medication management 04/01/2014   Smoking 07/21/2013   Hypertension    OSA and COPD overlap syndrome (HCC)    Hyperlipidemia associated with type 2 diabetes mellitus (HCC) 04/03/2011   CAD (coronary artery disease) 04/03/2011   Past Medical History:  Diagnosis Date   Aortic atherosclerosis (HCC)by Chest CT scan 11/2016 12/30/2017  Per chest CT 11/2016     Arthritis    B12 deficiency 04/22/2020   BPH (benign prostatic hyperplasia) 10/05/2014   CAD (coronary artery disease)    a. s/p stent to RCA 2005; b. LHC 2006 after Nuc with inf ischemia: patent RCA stent, D1 80%, LAD 60% => med Rx;  c. LHC 04/18/11: Proximal LAD 60% (unchanged from previous catheterization), distal LAD 65% (small caliber), Dx  60%, proximal CFX 20%, mid CFX 50%, OM1 30%, proximal RCA 20%, mid RCA stent patent, EF 65%.=> med Rx   COPD (chronic obstructive pulmonary disease) (HCC) 10/05/2014   Gallstones 02/21/2021   GERD (gastroesophageal reflux disease)    Hepatic steatosis 02/17/2020   Per CT 02/2020     History of colonic polyps 04/23/2020   HLD (hyperlipidemia)    Hyperlipidemia associated with type 2 diabetes mellitus (HCC) 04/03/2011   Hypertension    ILD (interstitial lung disease) (HCC) 02/21/2021   Suggested by CT 02/2021 - pulmonology referral placed     Mini stroke 08/2023   [facility]   Obesity    OSA (obstructive sleep apnea)    has a cpap-does not use-has a special mouthpiece he wears at night   RLS (restless legs syndrome) 10/05/2014   Type 2 diabetes mellitus (HCC) 06/05/2014   Type II or unspecified type diabetes mellitus without mention of complication, not stated as uncontrolled    Vitamin D  deficiency    Wears glasses    Social History   Tobacco Use   Smoking status: Former    Current packs/day: 0.00    Average packs/day: 0.3 packs/day for 55.8 years (14.0 ttl pk-yrs)    Types: Cigarettes    Start date: 76    Quit date: 06/16/2024    Years since quitting: 0.0    Passive exposure: Current   Smokeless tobacco: Never  Vaping Use   Vaping status: Never Used  Substance Use Topics   Alcohol use: Yes    Comment: 14 beers a week   Drug use: No   No Known Allergies    ROS Per HPI.    Objective:     BP 126/74   Pulse (!) 59   Temp 97.7 F (36.5 C) (Oral)   Resp 16   Ht 5' 10 (1.778 m)   Wt 197 lb 12.8 oz (89.7 kg)   SpO2 94%   BMI 28.38 kg/m  BP Readings from Last 3 Encounters:  07/22/24 126/74  07/09/24 (!) 113/56  07/01/24 122/70   Wt Readings from Last 3 Encounters:  07/22/24 197 lb 12.8 oz (89.7 kg)  07/09/24 197 lb (89.4 kg)  07/01/24 198 lb (89.8 kg)   SpO2 Readings from Last 3 Encounters:  07/22/24 94%  07/09/24 96%  07/01/24 94%       Physical Exam Constitutional:      General: He is not in acute distress.    Appearance: Normal appearance. He is not ill-appearing.  HENT:     Head: Normocephalic and atraumatic.     Right Ear: External ear normal.     Left Ear: External ear normal.     Nose: Nose normal.  Eyes:     Conjunctiva/sclera: Conjunctivae normal.  Cardiovascular:     Rate and Rhythm: Normal rate and regular rhythm.     Pulses: Normal pulses.     Heart sounds: Normal heart sounds. No murmur heard.    No friction rub.  Pulmonary:     Effort: Pulmonary effort is normal. No respiratory  distress.     Breath sounds: Normal breath sounds. No wheezing, rhonchi or rales.  Musculoskeletal:     Right lower leg: No edema.     Left lower leg: No edema.  Skin:    General: Skin is warm and dry.     Coloration: Skin is not jaundiced or pale.  Neurological:     Mental Status: He is alert.  Psychiatric:        Mood and Affect: Mood normal.        Behavior: Behavior normal.      No results found for any visits on 07/22/24.  Last CBC Lab Results  Component Value Date   WBC 8.1 07/09/2024   HGB 14.2 07/09/2024   HCT 41.1 07/09/2024   MCV 89.3 07/09/2024   MCH 30.9 07/09/2024   RDW 12.0 07/09/2024   PLT 155 07/09/2024   Last metabolic panel Lab Results  Component Value Date   GLUCOSE 214 (H) 07/09/2024   NA 135 07/09/2024   K 4.0 07/09/2024   CL 101 07/09/2024   CO2 25 07/09/2024   BUN 10 07/09/2024   CREATININE 0.84 07/09/2024   GFRNONAA >60 07/09/2024   CALCIUM  9.2 07/09/2024   PROT 6.8 07/09/2024   ALBUMIN 4.2 07/09/2024   LABGLOB 2.2 04/22/2024   BILITOT 0.7 07/09/2024   ALKPHOS 107 07/09/2024   AST 22 07/09/2024   ALT 42 07/09/2024   ANIONGAP 9 07/09/2024   Last lipids Lab Results  Component Value Date   CHOL 100 04/22/2024   HDL 42 04/22/2024   LDLCALC 42 04/22/2024   TRIG 77 04/22/2024   CHOLHDL 2.4 04/22/2024   Last hemoglobin A1c Lab Results  Component Value Date    HGBA1C 7.8 (H) 04/22/2024   Last vitamin D  Lab Results  Component Value Date   VD25OH 45 07/27/2023   The ASCVD Risk score (Arnett DK, et al., 2019) failed to calculate for the following reasons:   The valid total cholesterol range is 130 to 320 mg/dL    Assessment & Plan:   Assessment and Plan    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 Currently on metformin  2 tablets daily and glimepiride  1 tablet before bed. [name]  3 mg weekly, with plans to increase to 4.5 mg weekly. He is still hesitant about metformin  and we have discussed this multiple times now- we can focus on other meds. Glimepiride  should be taken with a meal to prevent hypoglycemia. [name]  is paused due to upcoming eye surgery to reduce aspiration risk. - Continue metformin  2 tablets daily. - Continue glimepiride  1 tablet with a meal- he has not been taking with a meal- emphasized this point. - Will increase [name]  to 4.5 mg weekly- will send through patient assistance program. - Checked B12 levels today. - Ordered A1c test today. Lab Results  Component Value Date   HGBA1C 7.8 (H) 04/22/2024   HGBA1C 7.9 (H) 01/16/2024   HGBA1C 6.6 (H) 10/02/2023   Primary adenocarcinoma of upper lobe of left lung Primary adenocarcinoma of the upper lobe of the left lung. Undergoing evaluation by oncologist and pulmonary specialist and CT surg. Plan for surgical resection pending surgeon consultation. Awaiting further details on radiation therapy. He has a positive outlook about the condition at this time. - Consulted with surgeon on Friday for surgical plan. - Await further details on therapies.  Primary hypertension Chronic, stable. No reports of chest pain or shortness  of breath. - Continue current antihypertensive regimen- ramipril   Hld Chronic, stable. - Continue Crestor  every other day. Lipid Panel     Component Value  Date/Time   CHOL 100 04/22/2024 1356   TRIG 77 04/22/2024 1356   HDL 42 04/22/2024 1356   CHOLHDL 2.4 04/22/2024 1356   CHOLHDL 2.3 07/27/2023 1000   VLDL 17 03/02/2017 1115   LDLCALC 42 04/22/2024 1356   LDLCALC 41 07/27/2023 1000   LABVLDL 16 04/22/2024 1356   History of transient ischemic attack (TIA) History of TIA with previous recommendation for dual antiplatelet therapy for 21 days (completed) and then monotherapy with plavix . Currently on Plavix - pt inquired about necessity of plavix  over aspirin . I do agree with the recommendation of monotherapy with plavix  over aspirin - pt was on aspirin  monotherapy when he had the TIA so I would count this as a failed trial of aspirin  monotherapy. Explained this to patient. - Continue plavix  monotherapy indefinitely unless bleeding risk increases later on  CAD No current CP or shortness of breath. Needs refill on nitroglycerin  today- will refill.   General Health Maintenance Discussion about shingles vaccination. Previous shingles vaccine was discontinued. He is considering vaccination but is concerned about timing with upcoming surgery. Recommended to receive at least one dose before surgery. - Consider shingles vaccination before surgery.  - Holding off on colonoscopy due to oral surgery, cancer treatment, and cataract surgery upcoming     Return in about 3 months (around 10/20/2024) for DM.    Jaequan Propes T Garo Heidelberg, PA-C

## 2024-07-23 ENCOUNTER — Telehealth: Payer: Self-pay

## 2024-07-23 ENCOUNTER — Telehealth (HOSPITAL_BASED_OUTPATIENT_CLINIC_OR_DEPARTMENT_OTHER): Payer: Self-pay

## 2024-07-23 ENCOUNTER — Ambulatory Visit (HOSPITAL_BASED_OUTPATIENT_CLINIC_OR_DEPARTMENT_OTHER): Payer: Self-pay | Admitting: Student

## 2024-07-23 LAB — FUNGAL ORGANISM REFLEX

## 2024-07-23 LAB — LIPID PANEL
Chol/HDL Ratio: 2.8 ratio (ref 0.0–5.0)
Cholesterol, Total: 106 mg/dL (ref 100–199)
HDL: 38 mg/dL — ABNORMAL LOW (ref >39)
LDL Chol Calc (NIH): 51 mg/dL (ref 0–99)
Triglycerides: 87 mg/dL (ref 0–149)
VLDL Cholesterol Cal: 17 mg/dL (ref 5–40)

## 2024-07-23 LAB — FUNGUS CULTURE RESULT

## 2024-07-23 LAB — MICROALBUMIN / CREATININE URINE RATIO
Creatinine, Urine: 70.8 mg/dL
Microalb/Creat Ratio: 13 mg/g{creat} (ref 0–29)
Microalbumin, Urine: 9.4 ug/mL

## 2024-07-23 LAB — FUNGUS CULTURE WITH STAIN

## 2024-07-23 LAB — VITAMIN B12: Vitamin B-12: 360 pg/mL (ref 232–1245)

## 2024-07-23 LAB — HEMOGLOBIN A1C
Est. average glucose Bld gHb Est-mCnc: 255 mg/dL
Hgb A1c MFr Bld: 10.5 % — ABNORMAL HIGH (ref 4.8–5.6)

## 2024-07-23 MED ORDER — TRULICITY 4.5 MG/0.5ML ~~LOC~~ SOAJ: 4.5000 mg | SUBCUTANEOUS | 1 refills | Status: AC

## 2024-07-23 NOTE — Telephone Encounter (Signed)
 PAP: Application for [name]  has been submitted to [employer], via fax  Prescriber sent in prescription electronically.

## 2024-07-23 NOTE — Addendum Note (Signed)
 Addended by: Charlotte Brafford on: 07/23/2024 11:26 AM   Modules accepted: Orders

## 2024-07-23 NOTE — Telephone Encounter (Signed)
 Called [facility] to discuss what we need to do since PCP is changing mounjaro 3mg  to mounjaro 4.5mg . [employer] requested that we do page 8+9 with the new dosage since his pt assistance expires 08/13/2024 and he will need next year's form done. PCP said pt already started his page (1-7).

## 2024-07-23 NOTE — Telephone Encounter (Signed)
 Copied from CRM #[id number]. Topic: Clinical - Lab/Test Results >> Jul 23, 2024  4:49 PM Nessti S wrote: Reason for CRM: pt returned nurse thalia call to receive lab results. Phone kept disconnecting; he will like a call back [phone] (M)

## 2024-07-24 DIAGNOSIS — H2512 Age-related nuclear cataract, left eye: Secondary | ICD-10-CM | POA: Diagnosis not present

## 2024-07-24 LAB — FUNGUS CULTURE WITH STAIN

## 2024-07-24 LAB — FUNGUS CULTURE RESULT

## 2024-07-24 LAB — FUNGAL ORGANISM REFLEX

## 2024-07-25 ENCOUNTER — Institutional Professional Consult (permissible substitution)
Attending: Thoracic Surgery (Cardiothoracic Vascular Surgery) | Admitting: Thoracic Surgery (Cardiothoracic Vascular Surgery)

## 2024-07-25 DIAGNOSIS — C3412 Malignant neoplasm of upper lobe, left bronchus or lung: Secondary | ICD-10-CM | POA: Diagnosis not present

## 2024-07-25 NOTE — Progress Notes (Signed)
 [address] [place] [address]             [phone]                   CORNELL BOURBON [facility] Medical Record #[id number] Date of Birth: Apr 11, 1951  Referring: [name] [name], MD Primary Care: Iven Lang [place], [place] Primary Cardiologist: Maude Emmer, MD  Chief Complaint:    Chief Complaint  Patient presents with   Lung Cancer    History of Present Illness:    LIONEL WOODBERRY is a 73 y.o. male who presents for surgical evaluation of a biopsy-proven adenocarcinoma of the left upper lobe.  He also has evidence of N2 disease on PET/CT.  He is tobacco free for over a month.  He denies any neurologic symptoms.     Zubrod Score: At the time of surgery this patients most appropriate activity status/level should be described as: [x]     0    Normal activity, no symptoms []     1    Restricted in physical strenuous activity but ambulatory, able to do out light work []     2    Ambulatory and capable of self care, unable to do work activities, up and about               >50 % of waking hours                              []     3    Only limited self care, in bed greater than 50% of waking hours []     4    Completely disabled, no self care, confined to bed or chair []     5    Moribund     Past Medical History:  Diagnosis Date   Aortic atherosclerosis (HCC)by Chest CT scan 11/2016 12/30/2017   Per chest CT 11/2016     Arthritis    B12 deficiency 04/22/2020   BPH (benign prostatic hyperplasia) 10/05/2014   CAD (coronary artery disease)    a. s/p stent to RCA 2005; b. LHC 2006 after Nuc with inf ischemia: patent RCA stent, D1 80%, LAD 60% => med Rx;  c. LHC 04/18/11: Proximal LAD 60% (unchanged from previous catheterization), distal LAD 65% (small caliber), Dx 60%, proximal CFX 20%, mid CFX 50%, OM1 30%, proximal RCA 20%, mid RCA stent patent, EF 65%.=> med Rx   COPD (chronic obstructive pulmonary disease) (HCC) 10/05/2014   Gallstones 02/21/2021    GERD (gastroesophageal reflux disease)    Hepatic steatosis 02/17/2020   Per CT 02/2020     History of colonic polyps 04/23/2020   HLD (hyperlipidemia)    Hyperlipidemia associated with type 2 diabetes mellitus (HCC) 04/03/2011   Hypertension    ILD (interstitial lung disease) (HCC) 02/21/2021   Suggested by CT 02/2021 - pulmonology referral placed     Mini stroke 08/2023   [facility] hospital   Obesity    OSA (obstructive sleep apnea)    has a cpap-does not use-has a special mouthpiece he wears at night   RLS (restless legs syndrome) 10/05/2014   Type 2 diabetes mellitus (HCC) 06/05/2014   Type II or unspecified type diabetes mellitus without mention of complication, not stated as uncontrolled    Vitamin D  deficiency    Wears glasses     Past Surgical History:  Procedure  Laterality Date   CARDIAC CATHETERIZATION  2005   stent   CHOLECYSTECTOMY N/A 10/09/2023   Procedure: LAPAROSCOPIC CHOLECYSTECTOMY WITH INDOCYANINE GREEN  DYE;  Surgeon: Signe Mitzie LABOR, MD;  Location: [facility];  Service: General;  Laterality: N/A;   CHONDROPLASTY  06/25/2017   Procedure: CHONDROPLASTY;  Surgeon: Yvone Rush, MD;  Location: [facility];  Service: Orthopedics;;   COLONOSCOPY  11/2011   Due 5 years Dr. Aneita   heart stent  2005   RCA   INGUINAL HERNIA REPAIR Right 11/16/2014   Procedure: RIGHT INGUINAL HERNIA REPAIR WITH MESH;  Surgeon: Krystal Spinner, MD;  Location: [facility];  Service: General;  Laterality: Right;   INSERTION OF MESH Right 11/16/2014   Procedure: INSERTION OF MESH;  Surgeon: Krystal Spinner, MD;  Location: [facility];  Service: General;  Laterality: Right;   KNEE ARTHROSCOPY WITH MEDIAL MENISECTOMY Bilateral 06/25/2017   Procedure: right knee arthroscopy with medial meniscectomy, chondroplasty, Left knee arthroscopy with medial menisectomy removal of plica and chondroplasty;  Surgeon: Yvone Rush, MD;  Location: [facility];  Service:  Orthopedics;  Laterality: Bilateral;   POLYPECTOMY     VIDEO BRONCHOSCOPY WITH ENDOBRONCHIAL NAVIGATION Left 06/24/2024   Procedure: VIDEO BRONCHOSCOPY WITH ENDOBRONCHIAL NAVIGATION;  Surgeon: Kara Dorn NOVAK, MD;  Location[facility];  Service: Pulmonary;  Laterality: Left;   VIDEO BRONCHOSCOPY WITH ENDOBRONCHIAL ULTRASOUND Bilateral 06/24/2024   Procedure: BRONCHOSCOPY, WITH EBUS;  Surgeon: Kara Dorn NOVAK, MD;  Location: [facility];  Service: Pulmonary;  Laterality: Bilateral;    Family History  Problem Relation Age of Onset   Vaginal cancer Mother        vaginal?    Prostate cancer Father    Hypertension Father    Breast cancer Sister    Breast cancer Sister    Kidney cancer Maternal Grandmother    Colon cancer Neg Hx    Colon polyps Neg Hx    Esophageal cancer Neg Hx    Rectal cancer Neg Hx    Stomach cancer Neg Hx      Tobacco Use History[1]  Social History   Substance and Sexual Activity  Alcohol Use Yes   Comment: 14 beers a week     Allergies[2]  Current Outpatient Medications  Medication Sig Dispense Refill   ACCU-CHEK GUIDE TEST test strip 1 each 3 (three) times daily.     Accu-Chek Softclix Lancets lancets SMARTSIG:Topical     acetaminophen  (TYLENOL ) 500 MG tablet Take 2 tablets (1,000 mg total) by mouth every 6 (six) hours. 30 tablet 0   albuterol  (VENTOLIN  HFA) 108 (90 Base) MCG/ACT inhaler Inhale 2 puffs into the lungs every 6 (six) hours as needed for wheezing or shortness of breath. 8 g 2   Blood Glucose Monitoring Suppl DEVI 1 each by Does not apply route in the morning, at noon, and at bedtime. May substitute to any manufacturer covered by patient's insurance. 1 each 0   Cinnamon 500 MG TABS Take 500 mg by mouth daily. (Patient taking differently: Take 500 mg by mouth as needed.)     clopidogrel  (PLAVIX ) 75 MG tablet Take 1 tablet by mouth once daily 90 tablet 3   doxazosin  (CARDURA ) 8 MG tablet Take 1 tablet by mouth at Bedtime for BP &  Prostate. 90 tablet 1   Dulaglutide  ([name] ) 4.5 MG/0.5ML SOAJ Inject 4.5 mg as directed once a week. 6 mL 1   fexofenadine (ALLEGRA) 60 MG tablet Take 60 mg by mouth  daily as needed for allergies or rhinitis.     finasteride  (PROSCAR ) 5 MG tablet TAKE 1 TABLET BY MOUTH ONCE DAILY FOR PROSTATE 90 tablet 1   fluticasone  furoate-vilanterol (BREO ELLIPTA ) 100-25 MCG/ACT AEPB Use 1 inhalation Daily for COPD / Asthma 60 each 3   glimepiride  (AMARYL ) 4 MG tablet TAKE 1/2 TO 1 (ONE-HALF TO ONE) TABLET BY MOUTH ONCE DAILY WITH SUPPER FOR DIABETES 90 tablet 2   metFORMIN  (GLUCOPHAGE -XR) 500 MG 24 hr tablet On week 1 take 2 tablets daily with breakfast (1,000 mg daily total), THEN on week 2 take 2 tablets with breakfast and 1 tablet with dinner (1,500 mg daily total), THEN on week 3 take 2 tablets daily with breakfast and 2 tablets daily with dinner (2,000 mg daily total). You will continue 4 tablets daily thereafter. (Patient taking differently: Take 500 mg by mouth 2 (two) times daily with a meal. On week 1 take 2 tablets daily with breakfast (1,000 mg daily total), THEN on week 2 take 2 tablets with breakfast and 1 tablet with dinner (1,500 mg daily total), THEN on week 3 take 2 tablets daily with breakfast and 2 tablets daily with dinner (2,000 mg daily total). You will continue 4 tablets daily thereafter.) 120 tablet 6   nitroGLYCERIN  (NITROSTAT ) 0.4 MG SL tablet Place 1 tablet (0.4 mg total) under the tongue every 5 (five) minutes as needed for chest pain. 25 tablet 3   pramipexole  (MIRAPEX ) 1 MG tablet 1-2 tabs at bedtime 180 tablet 2   ramipril  (ALTACE ) 5 MG capsule TAKE 1 CAPSULE BY MOUTH ONCE DAILY FOR  BLOOD  PRESSURE  AND  DIABETIC  KIDNEY  PROTECTION 90 capsule 1   rosuvastatin  (CRESTOR ) 20 MG tablet TAKE 1 TABLET BY MOUTH THREE TIMES A WEEK FOR CHOLESTEROL 30 tablet 6   No current facility-administered medications for this visit.   Facility-Administered Medications Ordered in Other Visits   Medication Dose Route Frequency Provider Last Rate Last Admin   regadenoson  (LEXISCAN ) injection SOLN 0.4 mg  0.4 mg Intravenous Once Crenshaw, Brian S, MD       technetium tetrofosmin  (TC-MYOVIEW ) injection 32.3 millicurie  32.3 millicurie Intravenous Once PRN Pietro Redell RAMAN, MD        Review of Systems  Constitutional:  Negative for malaise/fatigue.  Respiratory:  Negative for shortness of breath.   Cardiovascular:  Negative for chest pain.  Neurological: Negative.      PHYSICAL EXAMINATION: BP (!) 145/72   Pulse 71   Resp 18   Ht 5' 10 (1.778 m)   Wt 197 lb (89.4 kg)   SpO2 93%   BMI 28.27 kg/m  Physical Exam Constitutional:      General: He is not in acute distress.    Appearance: He is not ill-appearing.  HENT:     Head: Normocephalic.  Eyes:     Extraocular Movements: Extraocular movements intact.  Cardiovascular:     Rate and Rhythm: Normal rate.  Pulmonary:     Effort: Pulmonary effort is normal. No respiratory distress.  Abdominal:     General: Abdomen is flat. There is no distension.  Musculoskeletal:        General: Normal range of motion.     Cervical back: Normal range of motion.  Skin:    General: Skin is warm and dry.  Neurological:     General: No focal deficit present.     Mental Status: He is alert and oriented to person, place, and time.  I have independently reviewed the above radiology studies  and reviewed the findings with the patient.   Recent Lab Findings: Lab Results  Component Value Date   WBC 8.1 07/09/2024   HGB 14.2 07/09/2024   HCT 41.1 07/09/2024   PLT 155 07/09/2024   GLUCOSE 214 (H) 07/09/2024   CHOL 106 07/22/2024   TRIG 87 07/22/2024   HDL 38 (L) 07/22/2024   LDLCALC 51 07/22/2024   ALT 42 07/09/2024   AST 22 07/09/2024   NA 135 07/09/2024   K 4.0 07/09/2024   CL 101 07/09/2024   CREATININE 0.84 07/09/2024   BUN 10 07/09/2024   CO2 25 07/09/2024   TSH 1.40 07/27/2023   INR 0.9 04/13/2011    HGBA1C 10.5 (H) 07/22/2024    Diagnostic Studies & Laboratory data:     Recent Radiology Findings:   MR BRAIN W WO CONTRAST Result Date: 07/09/2024 CLINICAL DATA:  Non-small cell lung cancer.  Staging exam. EXAM: MRI HEAD WITHOUT AND WITH CONTRAST TECHNIQUE: Multiplanar, multiecho pulse sequences of the brain and surrounding structures were obtained without and with intravenous contrast. CONTRAST:  9 cc Vueway  COMPARISON:  CT angiography 08/15/2023.  MRI 08/15/2023. FINDINGS: Brain: Diffusion imaging does not show any acute or subacute infarction or other cause of restricted diffusion. No focal abnormality affects the brainstem. Age related cerebellar volume loss without focal insult. Incidental mega cisterna magna. Cerebral hemispheres show age related volume loss with mild chronic appearing small vessel ischemic change of the white matter. No cortical or large vessel territory stroke. No evidence of mass, hemorrhage, hydrocephalus or extra-axial collection. After contrast administration, no abnormal brain or leptomeningeal enhancement occurs. Vascular: Major vessels at the base of the brain show flow. Skull and upper cervical spine: Negative Sinuses/Orbits: Mild seasonal mucosal inflammation. No advanced sinusitis. Orbits negative. Other: None IMPRESSION: No evidence of metastatic disease. Age related volume loss and mild chronic small-vessel ischemic change of the cerebral hemispheric white matter. Electronically Signed   By: Oneil Officer M.D.   On: 07/09/2024 13:33       Assessment / Plan:   73 y.o. male with T1b N2a M0 adenocarcinoma of the left upper lobe.  He also has avid lymphadenopathy in his AP window.  His pulmonary functions are acceptable for resection.  He would require cardiac clearance.  I think that proceeding with neoadjuvant chemotherapy prior to surgery is appropriate however if he continues to have N2 disease on PET/CT following therapy, his case will need to be rediscussed to  determine if surgical resection would be beneficial.  I  spent 40 minutes with  the patient face to face in counseling and coordination of care.    Linnie MALVA Rayas 07/25/2024 3:08 PM           [1]  Social History Tobacco Use  Smoking Status Former   Current packs/day: 0.00   Average packs/day: 0.3 packs/day for 55.8 years (14.0 ttl pk-yrs)   Types: Cigarettes   Start date: 70   Quit date: 06/16/2024   Years since quitting: 0.1   Passive exposure: Current  Smokeless Tobacco Never  [2] No Known Allergies

## 2024-07-28 ENCOUNTER — Encounter: Payer: Self-pay | Admitting: Nurse Practitioner

## 2024-07-30 ENCOUNTER — Other Ambulatory Visit: Payer: Self-pay

## 2024-07-30 DIAGNOSIS — C3412 Malignant neoplasm of upper lobe, left bronchus or lung: Secondary | ICD-10-CM

## 2024-07-31 ENCOUNTER — Telehealth: Payer: Self-pay | Admitting: Medical Oncology

## 2024-07-31 ENCOUNTER — Telehealth: Payer: Self-pay | Admitting: Internal Medicine

## 2024-07-31 NOTE — Telephone Encounter (Signed)
 Appt-  Pt has seen Dr. Shyrl who he said recommended chemotherapy first. Asking when he will see [name] ?  I want to get started on my treatment..  I told pt his appt is jan 6th . He confirmed.

## 2024-07-31 NOTE — Telephone Encounter (Signed)
 Left a voicemail with the appointment detail.

## 2024-08-05 NOTE — Telephone Encounter (Signed)
 PAP: Patient assistance application for [name]  has been approved by PAP Companies: [name] from 08/14/2024 to 12/31/20206. Medication should be delivered to PAP Delivery: Home. For further shipping updates, please contact [name] at [phone]. Patient ID is: per automated system-approval letter went to providers office

## 2024-08-08 LAB — ACID FAST CULTURE WITH REFLEXED SENSITIVITIES (MYCOBACTERIA)
Acid Fast Culture: NEGATIVE
Acid Fast Culture: NEGATIVE

## 2024-08-11 NOTE — Progress Notes (Signed)
 [facility] 08/11/24 No primary care provider on file. Treatment Providers Haseeb Hussain, DDS  Dental procedures in this visit   [phone] - WAX TRY-IN (Completed)    Service provider: Darl Seen, DDS    Billing provider: Haseeb Hussain, DDS    HEALTH HISTORY ? Vitals:  BP Readings from Last 1 Encounters:  08/11/24 126/73    Pulse:  90 Medical history was reviewed and updated. No contraindication to care. Medical History[1] Surgical History[2] Social History   Tobacco Use   Smoking status: Every Day    Current packs/day: 0.50    Types: Cigarettes   Smokeless tobacco: Never  Substance Use Topics   Alcohol use: Yes    Alcohol/week: 10.0 standard drinks of alcohol    Types: 10 Cans of beer per week   Family History[3] Medications Ordered Prior to Encounter[4] Medical Risk Assessment ASA GRADE: ASA 2 - Patient with mild systemic disease with no functional limitations  Subjective: Patient reports no change to condition since last visit. Pt presents to clinic for try-in of acrylic partial-max  Objective:  All tissues appear healthy and wnl.  Assessment:  Discussed with patient risks and precautions.   Plan: Adjustment and confirmation of facial contour, occlusal plane position, and occlusal vertical dimension (adequate freeway space, speech pattern, lip line). Necessary adjustments made to teeth position for esthetics and function. Try in outcome: Pt approves of shade, shape and mold of selected teeth. Pt reviewed and signed try-in consent  Additional notes: sent to lab for final design and asked them to change shade to A4 for a better, exact match and to add teeth 9,10,11 for surgery  NV: delivery and extractions   Treatment Providers Darl Seen, DDS Waddell Bohr, DA [facility] [address] [place] [address] [phone]       [1] Past Medical History: Diagnosis Date   Aortic atherosclerosis  12/30/2017   Per chest CT 11/2016     CAD (coronary artery disease) 04/03/2011   Cancer (CMS/HCC)    COPD (chronic obstructive pulmonary disease) 10/05/2014   Diabetes mellitus    History of TIA (transient ischemic attack) 01/09/2024   Hypertension    Hypertension 06/21/2023   ILD (interstitial lung disease) (CMS/HCC) 02/21/2021   Suggested by CT 02/2021 - pulmonology referral placed    [2] No past surgical history on file. [3] No family history on file. [4] Current Outpatient Medications on File Prior to Visit  Medication Sig Dispense Refill   dulaglutide  ([name] ) 4.5 mg/0.5 mL pen injector Inject 4.5 mg as directed.     nitroglycerin  (NITROSTAT ) 0.4 mg SL tablet Place 0.4 mg under the tongue every 5 (five) minutes if needed for chest pain.     Accu-Chek Guide Me Glucose Mtr misc USE TO CHECK BLOOD SUGAR IN THE MORNING, AT NOON AND AT BEDTIME     Accu-Chek Guide test strips test strip USE 1 STRIP TO CHECK BLOOD SUGAR IN THE MORNING, AT NOON AND AT BEDTIME     Accu-Chek Softclix Lancets lancets USE TO CHECK BLOOD SUGAR IN THE MORNING, AT NOON AND AT BEDTIME     albuterol  HFA (PROVENTIL  HFA;VENTOLIN  HFA) 90 mcg/actuation inhaler Inhale 2 puffs.     aspirin  325 mg EC tablet Take 325 mg by mouth 1 (one) time each day.     clopidogreL  (PLAVIX ) 75 mg tablet Take 75 mg by mouth 1 (one) time each day.     doxazosin  (CARDURA ) 8 mg tablet TAKE 1 TABLET  BY MOUTH AT BEDTIME FOR BLOOD PRESSURE AND FOR PROSTATE     doxazosin  (CARDURA ) 8 mg tablet Take  1 tablet at Bedtime for BP & Prostate                                                                 /                                                    TAKE                                                               BY                                                   MOUTH     fexofenadine (ALLEGRA) 60 mg tablet Take 60 mg by mouth.     finasteride  (PROSCAR ) 5 mg tablet TAKE 1 TABLET BY MOUTH ONCE DAILY FOR PROSTATE      fluticasone  furoate-vilanteroL (BREO ELIPTA) 100-25 mcg/dose inhaler Use 1 inhalation Daily for COPD / Asthma     glimepiride  (AMARYL ) 4 mg tablet TAKE 1/2-1 TABLET BY MOUTH daily with supper for diabetes     metFORMIN  XR (GLUCOPHATE-XR) 500 mg 24 hr tablet Take 1 tablets daily with meal     pramipexole  (MIRAPEX ) 0.5 mg tablet Take 1 tablet by mouth every night.     ramipriL  (ALTACE ) 5 mg capsule TAKE 1 CAPSULE BY MOUTH ONCE DAILY FOR  BLOOD  PRESSURE  AND  DIABETIC  KIDNEY  PROTECTION     rosuvastatin  (CRESTOR ) 20 mg tablet TAKE 1 TABLET BY MOUTH THREE TIMES A WEEK FOR CHOLESTEROL     No current facility-administered medications on file prior to visit.

## 2024-08-16 ENCOUNTER — Other Ambulatory Visit (HOSPITAL_BASED_OUTPATIENT_CLINIC_OR_DEPARTMENT_OTHER): Payer: Self-pay | Admitting: Student

## 2024-08-16 DIAGNOSIS — N320 Bladder-neck obstruction: Secondary | ICD-10-CM

## 2024-08-19 ENCOUNTER — Inpatient Hospital Stay: Attending: Internal Medicine

## 2024-08-19 ENCOUNTER — Inpatient Hospital Stay: Admitting: Internal Medicine

## 2024-08-19 ENCOUNTER — Inpatient Hospital Stay

## 2024-08-19 VITALS — BP 131/64 | HR 71 | Temp 97.7°F | Resp 17 | Ht 70.0 in | Wt 199.9 lb

## 2024-08-19 DIAGNOSIS — C3412 Malignant neoplasm of upper lobe, left bronchus or lung: Secondary | ICD-10-CM | POA: Diagnosis not present

## 2024-08-19 DIAGNOSIS — Z5112 Encounter for antineoplastic immunotherapy: Secondary | ICD-10-CM | POA: Insufficient documentation

## 2024-08-19 DIAGNOSIS — Z5111 Encounter for antineoplastic chemotherapy: Secondary | ICD-10-CM | POA: Diagnosis present

## 2024-08-19 DIAGNOSIS — E538 Deficiency of other specified B group vitamins: Secondary | ICD-10-CM | POA: Insufficient documentation

## 2024-08-19 LAB — CMP (CANCER CENTER ONLY)
ALT: 28 U/L (ref 0–44)
AST: 18 U/L (ref 15–41)
Albumin: 4.1 g/dL (ref 3.5–5.0)
Alkaline Phosphatase: 122 U/L (ref 38–126)
Anion gap: 9 (ref 5–15)
BUN: 8 mg/dL (ref 8–23)
CO2: 25 mmol/L (ref 22–32)
Calcium: 9.2 mg/dL (ref 8.9–10.3)
Chloride: 102 mmol/L (ref 98–111)
Creatinine: 0.83 mg/dL (ref 0.61–1.24)
GFR, Estimated: >60 mL/min
Glucose, Bld: 352 mg/dL — ABNORMAL HIGH (ref 70–99)
Potassium: 4.3 mmol/L (ref 3.5–5.1)
Sodium: 136 mmol/L (ref 135–145)
Total Bilirubin: 0.4 mg/dL (ref 0.0–1.2)
Total Protein: 6.8 g/dL (ref 6.5–8.1)

## 2024-08-19 LAB — CBC WITH DIFFERENTIAL (CANCER CENTER ONLY)
Abs Immature Granulocytes: 0.03 K/uL (ref 0.00–0.07)
Basophils Absolute: 0 K/uL (ref 0.0–0.1)
Basophils Relative: 1 %
Eosinophils Absolute: 0.4 K/uL (ref 0.0–0.5)
Eosinophils Relative: 5 %
HCT: 42.1 % (ref 39.0–52.0)
Hemoglobin: 14.3 g/dL (ref 13.0–17.0)
Immature Granulocytes: 0 %
Lymphocytes Relative: 21 %
Lymphs Abs: 1.4 K/uL (ref 0.7–4.0)
MCH: 30.8 pg (ref 26.0–34.0)
MCHC: 34 g/dL (ref 30.0–36.0)
MCV: 90.7 fL (ref 80.0–100.0)
Monocytes Absolute: 0.7 K/uL (ref 0.1–1.0)
Monocytes Relative: 10 %
Neutro Abs: 4.3 K/uL (ref 1.7–7.7)
Neutrophils Relative %: 63 %
Platelet Count: 143 K/uL — ABNORMAL LOW (ref 150–400)
RBC: 4.64 MIL/uL (ref 4.22–5.81)
RDW: 12.4 % (ref 11.5–15.5)
WBC Count: 6.8 K/uL (ref 4.0–10.5)
nRBC: 0 % (ref 0.0–0.2)

## 2024-08-19 MED ORDER — FOLIC ACID 1 MG PO TABS: 1.0000 mg | ORAL_TABLET | Freq: Every day | ORAL | 3 refills | Status: AC

## 2024-08-19 MED ORDER — PROCHLORPERAZINE MALEATE 10 MG PO TABS: 10.0000 mg | ORAL_TABLET | Freq: Four times a day (QID) | ORAL | 1 refills | Status: AC | PRN

## 2024-08-19 MED ORDER — ONDANSETRON HCL 8 MG PO TABS: 8.0000 mg | ORAL_TABLET | Freq: Three times a day (TID) | ORAL | 1 refills | Status: AC | PRN

## 2024-08-19 MED ORDER — CYANOCOBALAMIN 1000 MCG/ML IJ SOLN
1000.0000 ug | Freq: Once | INTRAMUSCULAR | Status: AC
  Administered 2024-08-19: 1000 ug via INTRAMUSCULAR
  Filled 2024-08-19: qty 1

## 2024-08-19 NOTE — Progress Notes (Signed)
 "     [facility] [phone]   [phone]  OFFICE PROGRESS NOTE  [name], Lang DASEN, PA-C [address] [place] [address]  DIAGNOSIS: Stage IIb (T1b, N2a, M0) non-small cell lung cancer, adenocarcinoma presented with left lingular nodule in addition to left hilar and AP window lymphadenopathy diagnosed in November 2025.   PRIOR THERAPY:   CURRENT THERAPY:  INTERVAL HISTORY: Joe Green 74 y.o. male returns to the clinic today for follow-up visit. Discussed the use of AI scribe software for clinical note transcription with the patient, who gave verbal consent to proceed.  History of Present Illness Joe Green is a 74 year old male with stage II small cell lung cancer, adenocarcinoma, who presents for evaluation prior to initiation of neoadjuvant chemoimmunotherapy.  He was diagnosed with stage II small cell lung cancer, adenocarcinoma, in November 2025 and has not yet received chemotherapy or immunotherapy. He recently met with a surgical oncologist who indicated that surgical resection may be possible if the tumor responds to neoadjuvant therapy. He is being considered for a regimen of cisplatin, pemetrexed, and an immune checkpoint inhibitor (pembrolizumab or nivolumab), to be administered every three weeks for three cycles, with post-treatment imaging to assess response prior to surgical evaluation.  He is scheduled for extraction of three upper teeth tomorrow and inquired about the timing of chemotherapy initiation in relation to this dental procedure. He expressed concern regarding potential complications from the dental extraction and the impact on the cancer treatment schedule.  He sought detailed information regarding the side effects of the proposed regimen, including the likelihood of alopecia, nausea, vomiting, fatigue, neuropathy, and ototoxicity. He expressed concern about the impact of treatment on his daily life,  including upcoming travel plans in June.  He asked about the safety of continuing his current vitamin regimen during treatment and was advised that multivitamins and minerals are acceptable. He and his wife are attentive to infection prevention, especially during influenza season, and have obtained masks for use at home.     MEDICAL HISTORY: Past Medical History:  Diagnosis Date   Aortic atherosclerosis (HCC)by Chest CT scan 11/2016 12/30/2017   Per chest CT 11/2016     Arthritis    B12 deficiency 04/22/2020   BPH (benign prostatic hyperplasia) 10/05/2014   CAD (coronary artery disease)    a. s/p stent to RCA 2005; b. LHC 2006 after Nuc with inf ischemia: patent RCA stent, D1 80%, LAD 60% => med Rx;  c. LHC 04/18/11: Proximal LAD 60% (unchanged from previous catheterization), distal LAD 65% (small caliber), Dx 60%, proximal CFX 20%, mid CFX 50%, OM1 30%, proximal RCA 20%, mid RCA stent patent, EF 65%.=> med Rx   COPD (chronic obstructive pulmonary disease) (HCC) 10/05/2014   Gallstones 02/21/2021   GERD (gastroesophageal reflux disease)    Hepatic steatosis 02/17/2020   Per CT 02/2020     History of colonic polyps 04/23/2020   HLD (hyperlipidemia)    Hyperlipidemia associated with type 2 diabetes mellitus (HCC) 04/03/2011   Hypertension    ILD (interstitial lung disease) (HCC) 02/21/2021   Suggested by CT 02/2021 - pulmonology referral placed     Mini stroke 08/2023   [facility] hospital   Obesity    OSA (obstructive sleep apnea)    has a cpap-does not use-has a special mouthpiece he wears at night   RLS (restless legs syndrome) 10/05/2014   Type 2 diabetes mellitus (HCC) 06/05/2014   Type II or unspecified type diabetes  mellitus without mention of complication, not stated as uncontrolled    Vitamin D  deficiency    Wears glasses     ALLERGIES:  has no known allergies.  MEDICATIONS:  Current Outpatient Medications  Medication Sig Dispense Refill   ACCU-CHEK GUIDE TEST test strip 1  each 3 (three) times daily.     Accu-Chek Softclix Lancets lancets SMARTSIG:Topical     acetaminophen  (TYLENOL ) 500 MG tablet Take 2 tablets (1,000 mg total) by mouth every 6 (six) hours. 30 tablet 0   albuterol  (VENTOLIN  HFA) 108 (90 Base) MCG/ACT inhaler Inhale 2 puffs into the lungs every 6 (six) hours as needed for wheezing or shortness of breath. 8 g 2   Blood Glucose Monitoring Suppl DEVI 1 each by Does not apply route in the morning, at noon, and at bedtime. May substitute to any manufacturer covered by patient's insurance. 1 each 0   Cinnamon 500 MG TABS Take 500 mg by mouth daily. (Patient taking differently: Take 500 mg by mouth as needed.)     clopidogrel  (PLAVIX ) 75 MG tablet Take 1 tablet by mouth once daily 90 tablet 3   doxazosin  (CARDURA ) 8 MG tablet TAKE 1 TABLET BY MOUTH AT BEDTIME FOR BLOOD PRESSURE AND FOR PROSTATE 90 tablet 0   Dulaglutide  ([name] ) 4.5 MG/0.5ML SOAJ Inject 4.5 mg as directed once a week. 6 mL 1   fexofenadine (ALLEGRA) 60 MG tablet Take 60 mg by mouth daily as needed for allergies or rhinitis.     finasteride  (PROSCAR ) 5 MG tablet TAKE 1 TABLET BY MOUTH ONCE DAILY FOR PROSTATE 90 tablet 1   fluticasone  furoate-vilanterol (BREO ELLIPTA ) 100-25 MCG/ACT AEPB Use 1 inhalation Daily for COPD / Asthma 60 each 3   glimepiride  (AMARYL ) 4 MG tablet TAKE 1/2 TO 1 (ONE-HALF TO ONE) TABLET BY MOUTH ONCE DAILY WITH SUPPER FOR DIABETES 90 tablet 2   metFORMIN  (GLUCOPHAGE -XR) 500 MG 24 hr tablet On week 1 take 2 tablets daily with breakfast (1,000 mg daily total), THEN on week 2 take 2 tablets with breakfast and 1 tablet with dinner (1,500 mg daily total), THEN on week 3 take 2 tablets daily with breakfast and 2 tablets daily with dinner (2,000 mg daily total). You will continue 4 tablets daily thereafter. (Patient taking differently: Take 500 mg by mouth 2 (two) times daily with a meal. On week 1 take 2 tablets daily with breakfast (1,000 mg daily total), THEN on week 2 take 2  tablets with breakfast and 1 tablet with dinner (1,500 mg daily total), THEN on week 3 take 2 tablets daily with breakfast and 2 tablets daily with dinner (2,000 mg daily total). You will continue 4 tablets daily thereafter.) 120 tablet 6   nitroGLYCERIN  (NITROSTAT ) 0.4 MG SL tablet Place 1 tablet (0.4 mg total) under the tongue every 5 (five) minutes as needed for chest pain. 25 tablet 3   pramipexole  (MIRAPEX ) 1 MG tablet 1-2 tabs at bedtime 180 tablet 2   ramipril  (ALTACE ) 5 MG capsule TAKE 1 CAPSULE BY MOUTH ONCE DAILY FOR  BLOOD  PRESSURE  AND  DIABETIC  KIDNEY  PROTECTION 90 capsule 0   rosuvastatin  (CRESTOR ) 20 MG tablet TAKE 1 TABLET BY MOUTH THREE TIMES A WEEK FOR CHOLESTEROL 30 tablet 6   No current facility-administered medications for this visit.   Facility-Administered Medications Ordered in Other Visits  Medication Dose Route Frequency Provider Last Rate Last Admin   regadenoson  (LEXISCAN ) injection SOLN 0.4 mg  0.4 mg Intravenous Once Crenshaw,  Redell RAMAN, MD       technetium tetrofosmin  (TC-MYOVIEW ) injection 32.3 millicurie  32.3 millicurie Intravenous Once PRN Pietro Redell RAMAN, MD        SURGICAL HISTORY:  Past Surgical History:  Procedure Laterality Date   CARDIAC CATHETERIZATION  2005   stent   CHOLECYSTECTOMY N/A 10/09/2023   Procedure: LAPAROSCOPIC CHOLECYSTECTOMY WITH INDOCYANINE GREEN  DYE;  Surgeon: Signe Mitzie LABOR, MD;  Location: [facility];  Service: General;  Laterality: N/A;   CHONDROPLASTY  06/25/2017   Procedure: CHONDROPLASTY;  Surgeon: Yvone Rush, MD;  Location: [facility];  Service: Orthopedics;;   COLONOSCOPY  11/2011   Due 5 years Dr. Aneita   heart stent  2005   RCA   INGUINAL HERNIA REPAIR Right 11/16/2014   Procedure: RIGHT INGUINAL HERNIA REPAIR WITH MESH;  Surgeon: Krystal Spinner, MD;  Location: [facility];  Service: General;  Laterality: Right;   INSERTION OF MESH Right 11/16/2014   Procedure: INSERTION OF MESH;  Surgeon: Krystal Spinner, MD;  Location: [facility];  Service: General;  Laterality: Right;   KNEE ARTHROSCOPY WITH MEDIAL MENISECTOMY Bilateral 06/25/2017   Procedure: right knee arthroscopy with medial meniscectomy, chondroplasty, Left knee arthroscopy with medial menisectomy removal of plica and chondroplasty;  Surgeon: Yvone Rush, MD;  Location: [facility];  Service: Orthopedics;  Laterality: Bilateral;   POLYPECTOMY     VIDEO BRONCHOSCOPY WITH ENDOBRONCHIAL NAVIGATION Left 06/24/2024   Procedure: VIDEO BRONCHOSCOPY WITH ENDOBRONCHIAL NAVIGATION;  Surgeon: Kara Dorn NOVAK, MD;  Location: [facility];  Service: Pulmonary;  Laterality: Left;   VIDEO BRONCHOSCOPY WITH ENDOBRONCHIAL ULTRASOUND Bilateral 06/24/2024   Procedure: BRONCHOSCOPY, WITH EBUS;  Surgeon: Kara Dorn NOVAK, MD;  Location: [facility];  Service: Pulmonary;  Laterality: Bilateral;    REVIEW OF SYSTEMS:  Constitutional: positive for fatigue Eyes: negative Ears, nose, mouth, throat, and face: negative Respiratory: negative Cardiovascular: negative Gastrointestinal: negative Genitourinary:negative Integument/breast: negative Hematologic/lymphatic: negative Musculoskeletal:negative Neurological: negative Behavioral/Psych: negative Endocrine: negative Allergic/Immunologic: negative   PHYSICAL EXAMINATION: General appearance: alert, cooperative, and no distress Head: Normocephalic, without obvious abnormality, atraumatic Neck: no adenopathy, no JVD, supple, symmetrical, trachea midline, and thyroid  not enlarged, symmetric, no tenderness/mass/nodules Lymph nodes: Cervical, supraclavicular, and axillary nodes normal. Resp: clear to auscultation bilaterally Back: symmetric, no curvature. ROM normal. No CVA tenderness. Cardio: regular rate and rhythm, S1, S2 normal, no murmur, click, rub or gallop GI: soft, non-tender; bowel sounds normal; no masses,  no organomegaly Extremities: extremities normal,  atraumatic, no cyanosis or edema Neurologic: Alert and oriented X 3, normal strength and tone. Normal symmetric reflexes. Normal coordination and gait  ECOG PERFORMANCE STATUS: 1 - Symptomatic but completely ambulatory  Blood pressure 131/64, pulse 71, temperature 97.7 F (36.5 C), temperature source Temporal, resp. rate 17, height 5' 10 (1.778 m), weight 199 lb 14.4 oz (90.7 kg), SpO2 98%.  LABORATORY DATA: Lab Results  Component Value Date   WBC 6.8 08/19/2024   HGB 14.3 08/19/2024   HCT 42.1 08/19/2024   MCV 90.7 08/19/2024   PLT 143 (L) 08/19/2024      Chemistry      Component Value Date/Time   NA 135 07/09/2024 0734   NA 137 04/22/2024 1356   K 4.0 07/09/2024 0734   CL 101 07/09/2024 0734   CO2 25 07/09/2024 0734   BUN 10 07/09/2024 0734   BUN 8 04/22/2024 1356   CREATININE 0.84 07/09/2024 0734   CREATININE 0.79 08/30/2023 1404  Component Value Date/Time   CALCIUM  9.2 07/09/2024 0734   ALKPHOS 107 07/09/2024 0734   AST 22 07/09/2024 0734   ALT 42 07/09/2024 0734   BILITOT 0.7 07/09/2024 0734       RADIOGRAPHIC STUDIES: No results found.  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 He has excellent performance status and is a candidate for neoadjuvant chemoimmunotherapy to achieve tumor reduction prior to potential surgical resection. Cisplatin is preferred for its higher efficacy, with carboplatin as an alternative if cisplatin is not tolerated. Risks include alopecia, nausea, vomiting, fatigue, neuropathy, and ototoxicity. Hydration is necessary peri-cisplatin to mitigate nephrotoxicity. Chemotherapy initiation is coordinated with dental extractions and recovery. - Initiate neoadjuvant chemoimmunotherapy next week: three cycles every three weeks. - Use cisplatin as the primary platinum agent; substitute carboplatin if  cisplatin intolerance occurs. - Include pemetrexed (Alimta) and an immune checkpoint inhibitor (pembrolizumab or nivolumab) in the regimen. - Obtain post-treatment imaging to assess response prior to surgical evaluation. - Coordinate chemotherapy start with completion of dental extractions and recovery. - Provide anticipatory guidance regarding alopecia, nausea, vomiting, fatigue, neuropathy, and ototoxicity. - Prescribe antiemetics for use during and after chemotherapy. - Advise hydration before and after cisplatin infusions to protect renal function. - Counsel to avoid exposure to infectious contacts during influenza season. - Schedule follow-up one week after first treatment to assess tolerance. - Arrange all appointments and treatments on calendar.  Vitamin B12 deficiency Vitamin B12 deficiency requiring supplementation prior to chemotherapy. Folic acid  supplementation is also indicated. - Administered vitamin B12 injection in clinic today. - Prescribed folic acid  supplementation to begin immediately. The patient voices understanding of current disease status and treatment options and is in agreement with the current care plan.  All questions were answered. The patient knows to call the clinic with any problems, questions or concerns. We can certainly see the patient much sooner if necessary. The total time spent in the appointment was 55 minutes including review of chart and various tests results, discussions about plan of care and coordination of care plan .  Disclaimer: This note was dictated with voice recognition software. Similar sounding words can inadvertently be transcribed and may not be corrected upon review.        "

## 2024-08-19 NOTE — Progress Notes (Signed)
 START OFF PATHWAY REGIMEN - Non-Small Cell Lung   [id number]:Cisplatin 75 mg/m2 IV D1 + Pembrolizumab 200 mg IV D1 + Pemetrexed 500 mg/m2 IV D1 q21 Days for up to 4 Cycles:   A cycle is every 21 days:     Pembrolizumab      Pemetrexed      Cisplatin    [id number]:Cisplatin 75 mg/m2 IV D1 + Nivolumab 360 mg IV D1 + Pemetrexed 500 mg/m2 IV D1 q21 Days:   A cycle is every 21 days:     Nivolumab      Pemetrexed      Cisplatin   **Always confirm dose/schedule in your pharmacy ordering system**  Clinician Citation:   Patient Characteristics: Preoperative or Nonsurgical Candidate (Clinical Staging), Stage IIB (N2a only) or Stage IIIA - Surgical Candidate, EGFR Positive/Unknown or ALK Positive/Unknown, Nonsquamous Cell Therapeutic Status: Preoperative or Nonsurgical Candidate (Clinical Staging) Check here if patient was staged using an edition other than AJCC Staging 9th Edition: false AJCC T Category: cT1b AJCC N Category: cN2a AJCC M Category: cM0 AJCC 9 Stage Grouping: IIB EGFR Mutation Status: Quantity Not Sufficient ALK Fusion/Rearrangement Status: Quantity Not Sufficient Histology: Nonsquamous Cell Intent of Therapy: Curative Intent, Discussed with Patient

## 2024-08-19 NOTE — Progress Notes (Signed)
 Request for tissue be sent for molecular studies and PD-L1 emailed to Cheril Fuse, [facility] path tech.

## 2024-08-19 NOTE — Telephone Encounter (Signed)
 A user error has taken place: encounter opened in error, closed for administrative reasons.

## 2024-08-20 ENCOUNTER — Other Ambulatory Visit: Payer: Self-pay

## 2024-08-20 ENCOUNTER — Other Ambulatory Visit (HOSPITAL_BASED_OUTPATIENT_CLINIC_OR_DEPARTMENT_OTHER): Payer: Self-pay | Admitting: Student

## 2024-08-20 DIAGNOSIS — N401 Enlarged prostate with lower urinary tract symptoms: Secondary | ICD-10-CM

## 2024-08-21 NOTE — Progress Notes (Signed)
 "   Cardiology Office Note    Date:  08/22/2024   ID:  Joe Green, DOB [birth year], MRN [id number]   PCP:  Iven Lang ONEIDA RIGGERS   [facility]  Cardiologist:  Maude Emmer, MD   [phone]   History of Present Illness:  Joe Green is a 74 y.o. male with history of hypertension, HLD, DM2, COPD, CAD with stent to the RCA 2005, chronic RBBB, cath 2006 patent RCA stent with residual 80% diagonal and moderate LAD diagonal 1 disease.  Cath 2012  patent RCA stent, proximal LAD 60% unchanged LVEF 65% medical therapy, normal NST 07/2019   Patient comes in for f/u. Has an occasional pressure that eases within minutes. Not associated with activity. Happens at random times. Comes in because CT showed aortic atherosclerosis and he wanted to know more about it. Does projects around the house and has a workshop. No regular exercise outside of all the work on his house. Has a bike at home   Myovue done 07/24/19 normal no ischemia EF 65%  Myovue 11/28/22 low risk no ischemia EF 57%  Normal ABI's 05/20/20   Seen in ED 10/31/22 for lumbar strain and flank pain Had non obstructing uretal stone Rx with valium  and Norco  Bullous emphysema and ILD Sees Dr Lonna Theophilus Finn pulmonary Deferred lung biopsy with Dr Kerrin 40 pack year smoking history ? 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 s/p stent to RCA 2005; b. LHC 2006 after Nuc with inf ischemia: patent RCA stent, D1 80%, LAD 60% => med Rx;  c. LHC 04/18/11: Proximal LAD 60% (unchanged from previous catheterization), distal LAD 65% (small caliber), Dx 60%, proximal CFX 20%, mid CFX 50%, OM1 30%, proximal RCA 20%, mid RCA stent patent, EF 65%.=> med Rx   COPD (chronic obstructive pulmonary disease) (HCC) 10/05/2014   Gallstones 02/21/2021   GERD (gastroesophageal reflux disease)    Hepatic steatosis 02/17/2020   Per CT 02/2020     History of colonic polyps 04/23/2020   HLD (hyperlipidemia)    Hyperlipidemia associated with type 2 diabetes mellitus (HCC) 04/03/2011   Hypertension    ILD (interstitial lung disease) (HCC) 02/21/2021   Suggested by CT 02/2021 - pulmonology referral placed     Mini stroke 08/2023   [facility] hospital   Obesity    OSA (obstructive sleep apnea)    has a cpap-does not use-has a special mouthpiece he wears at night   RLS (restless legs syndrome) 10/05/2014   Type 2 diabetes mellitus (HCC) 06/05/2014   Type II or unspecified type diabetes mellitus without mention of complication, not stated as uncontrolled    Vitamin D  deficiency    Wears glasses     Past Surgical  History:  Procedure Laterality Date   CARDIAC CATHETERIZATION  2005   stent   CHOLECYSTECTOMY N/A 10/09/2023   Procedure: LAPAROSCOPIC CHOLECYSTECTOMY WITH INDOCYANINE GREEN  DYE;  Surgeon: Signe Mitzie LABOR, MD;  Location: [facility];  Service: General;  Laterality: N/A;   CHONDROPLASTY  06/25/2017   Procedure: CHONDROPLASTY;  Surgeon: Yvone Rush, MD;  Location: [facility];  Service: Orthopedics;;   COLONOSCOPY  11/2011   Due 5 years Dr. Aneita   heart stent  2005   RCA   INGUINAL HERNIA REPAIR Right 11/16/2014   Procedure: RIGHT INGUINAL HERNIA REPAIR WITH MESH;  Surgeon: Krystal Spinner, MD;  Location: [facility];  Service: General;  Laterality:  Right;   INSERTION OF MESH Right 11/16/2014   Procedure: INSERTION OF MESH;  Surgeon: Krystal Spinner, MD;  Location: [facility];  Service: General;  Laterality: Right;   KNEE ARTHROSCOPY WITH MEDIAL MENISECTOMY Bilateral 06/25/2017   Procedure: right knee arthroscopy with medial meniscectomy, chondroplasty, Left knee arthroscopy with medial menisectomy removal of plica and chondroplasty;  Surgeon: Yvone Rush, MD;  Location: [facility];  Service: Orthopedics;  Laterality: Bilateral;   POLYPECTOMY     VIDEO BRONCHOSCOPY WITH ENDOBRONCHIAL NAVIGATION Left 06/24/2024   Procedure: VIDEO BRONCHOSCOPY WITH ENDOBRONCHIAL NAVIGATION;  Surgeon: Kara Dorn NOVAK, MD;  Location: [facility];  Service: Pulmonary;  Laterality: Left;   VIDEO BRONCHOSCOPY WITH ENDOBRONCHIAL ULTRASOUND Bilateral 06/24/2024   Procedure: BRONCHOSCOPY, WITH EBUS;  Surgeon: Kara Dorn NOVAK, MD;  Location: [facility];  Service: Pulmonary;  Laterality: Bilateral;    Current Medications: Current Meds  Medication Sig   ACCU-CHEK GUIDE TEST test strip 1 each 3 (three) times daily.   Accu-Chek Softclix Lancets lancets SMARTSIG:Topical   acetaminophen  (TYLENOL ) 500 MG tablet Take 2 tablets (1,000 mg total) by mouth every 6 (six) hours.   albuterol  (VENTOLIN  HFA) 108 (90 Base) MCG/ACT inhaler Inhale 2 puffs into the lungs every 6 (six) hours as needed for wheezing or shortness of breath.   Blood Glucose Monitoring Suppl DEVI 1 each by Does not apply route in the morning, at noon, and at bedtime. May substitute to any manufacturer covered by patient's insurance.   Cinnamon 500 MG TABS Take 500 mg by mouth daily. (Patient taking differently: Take 500 mg by mouth as needed.)   clopidogrel  (PLAVIX ) 75 MG tablet Take 1 tablet by mouth once daily   doxazosin  (CARDURA ) 8 MG tablet TAKE 1 TABLET BY MOUTH AT BEDTIME FOR BLOOD PRESSURE AND FOR PROSTATE   Dulaglutide  ([name] ) 4.5 MG/0.5ML SOAJ Inject 4.5 mg as  directed once a week.   fexofenadine (ALLEGRA) 60 MG tablet Take 60 mg by mouth daily as needed for allergies or rhinitis.   finasteride  (PROSCAR ) 5 MG tablet TAKE 1 TABLET BY MOUTH ONCE DAILY FOR PROSTATE   fluticasone  furoate-vilanterol (BREO ELLIPTA ) 100-25 MCG/ACT AEPB Use 1 inhalation Daily for COPD / Asthma   folic acid  (FOLVITE ) 1 MG tablet Take 1 tablet (1 mg total) by mouth daily. Start 7 days before pemetrexed  chemotherapy. Continue until 21 days after pemetrexed  completed.   glimepiride  (AMARYL ) 4 MG tablet TAKE 1/2 TO 1 (ONE-HALF TO ONE) TABLET BY MOUTH ONCE DAILY WITH SUPPER FOR DIABETES   metFORMIN  (GLUCOPHAGE -XR) 500 MG 24 hr tablet On week 1 take 2 tablets daily with breakfast (1,000 mg daily total), THEN on week 2 take 2 tablets with breakfast and 1 tablet with dinner (1,500 mg daily total), THEN on week 3 take 2 tablets  daily with breakfast and 2 tablets daily with dinner (2,000 mg daily total). You will continue 4 tablets daily thereafter. (Patient taking differently: Take 500 mg by mouth 2 (two) times daily with a meal. On week 1 take 2 tablets daily with breakfast (1,000 mg daily total), THEN on week 2 take 2 tablets with breakfast and 1 tablet with dinner (1,500 mg daily total), THEN on week 3 take 2 tablets daily with breakfast and 2 tablets daily with dinner (2,000 mg daily total). You will continue 4 tablets daily thereafter.)   nitroGLYCERIN  (NITROSTAT ) 0.4 MG SL tablet Place 1 tablet (0.4 mg total) under the tongue every 5 (five) minutes as needed for chest pain.   ondansetron  (ZOFRAN ) 8 MG tablet Take 1 tablet (8 mg total) by mouth every 8 (eight) hours as needed for nausea or vomiting. Start on the third day after cisplatin .   pramipexole  (MIRAPEX ) 1 MG tablet 1-2 tabs at bedtime   prochlorperazine  (COMPAZINE ) 10 MG tablet Take 1 tablet (10 mg total) by mouth every 6 (six) hours as needed for nausea or vomiting.   ramipril  (ALTACE ) 5 MG capsule TAKE 1 CAPSULE BY MOUTH ONCE  DAILY FOR  BLOOD  PRESSURE  AND  DIABETIC  KIDNEY  PROTECTION   rosuvastatin  (CRESTOR ) 20 MG tablet TAKE 1 TABLET BY MOUTH THREE TIMES A WEEK FOR CHOLESTEROL     Allergies:   Patient has no known allergies.   Social History   Socioeconomic History   Marital status: Married    Spouse name: Not on file   Number of children: 3   Years of education: Not on file   Highest education level: Not on file  Occupational History   Not on file  Tobacco Use   Smoking status: Former    Current packs/day: 0.00    Average packs/day: 0.3 packs/day for 55.8 years (14.0 ttl pk-yrs)    Types: Cigarettes    Start date: 31    Quit date: 06/16/2024    Years since quitting: 0.1    Passive exposure: Current   Smokeless tobacco: Never  Vaping Use   Vaping status: Never Used  Substance and Sexual Activity   Alcohol use: Yes    Comment: 14 beers a week   Drug use: No   Sexual activity: Yes    Partners: Female  Other Topics Concern   Not on file  Social History Narrative   Not on file   Social Drivers of Health   Tobacco Use: Medium Risk (08/22/2024)   Patient History    Smoking Tobacco Use: Former    Smokeless Tobacco Use: Never    Passive Exposure: Current  Physicist, Medical Strain: Not on file  Food Insecurity: No Food Insecurity (07/22/2024)   Epic    Worried About Programme Researcher, Broadcasting/film/video in the Last Year: Never true    Ran Out of Food in the Last Year: Never true  Transportation Needs: No Transportation Needs (07/22/2024)   Epic    Lack of Transportation (Medical): No    Lack of Transportation (Non-Medical): No  Physical Activity: Not on file  Stress: Not on file  Social Connections: Not on file  Depression (EYV7-0): Low Risk (07/22/2024)   Depression (PHQ2-9)    PHQ-2 Score: 3  Alcohol Screen: Not on file  Housing: Unknown (07/22/2024)   Epic    Unable to Pay for Housing in the Last Year: No    Number of Times Moved in the Last Year: Not on file  Homeless in the Last Year: No   Utilities: Not At Risk (07/22/2024)   Epic    Threatened with loss of utilities: No  Health Literacy: Not on file     Family History:  The patient's  family history includes Breast cancer in his sister and sister; Hypertension in his father; Kidney cancer in his maternal grandmother; Prostate cancer in his father; Vaginal cancer in his mother.   ROS:   Please see the history of present illness.    ROS All other systems reviewed and are negative.   PHYSICAL EXAM:   VS:  BP 133/72 (BP Location: Left Arm, Patient Position: Sitting, Cuff Size: Normal)   Pulse 92   Resp 17   Ht 5' 10 (1.778 m)   Wt 198 lb (89.8 kg)   SpO2 92%   BMI 28.41 kg/m   Physical Exam  GEN: Well nourished, well developed, in no acute distress  Neck: no JVD, carotid bruits, or masses Cardiac:RRR; no murmurs, rubs, or gallops  Respiratory: decreased breath sounds but clear to auscultation bilaterally, normal work of breathing GI: soft, nontender, nondistended, + BS Ext: without cyanosis, clubbing, or edema, Good distal pulses bilaterally Neuro:  Alert and Oriented x 3, Psych: euthymic mood, full affect  Wt Readings from Last 3 Encounters:  08/22/24 198 lb (89.8 kg)  08/19/24 199 lb 14.4 oz (90.7 kg)  07/25/24 197 lb (89.4 kg)      Studies/Labs Reviewed:   EKG:   SR RBBB 06/29/22 old IMI  Recent Labs: 08/19/2024: ALT 28; BUN 8; Creatinine 0.83; Hemoglobin 14.3; Platelet Count 143; Potassium 4.3; Sodium 136   Lipid Panel    Component Value Date/Time   CHOL 106 07/22/2024 1145   TRIG 87 07/22/2024 1145   HDL 38 (L) 07/22/2024 1145   CHOLHDL 2.8 07/22/2024 1145   CHOLHDL 2.3 07/27/2023 1000   VLDL 17 03/02/2017 1115   LDLCALC 51 07/22/2024 1145   LDLCALC 41 07/27/2023 1000    Additional studies/ records that were reviewed today include:   CT chest 04/2021 IMPRESSION: 1. Pulmonary parenchymal pattern of peripheral and basilar predominant ground-glass with very minimal subpleural  reticulation and traction bronchiolectasis, similar to 02/17/2020. Findings may be due to nonspecific interstitial pneumonitis. Findings are suggestive of an alternative diagnosis (not UIP) per consensus guidelines: Diagnosis of Idiopathic Pulmonary Fibrosis: An Official ATS/ERS/JRS/ALAT Clinical Practice Guideline. Am JINNY Honey Crit Care Med Vol 198, Iss 5, [phone], Apr 14 2017. 2. Cholelithiasis. 3. Aortic atherosclerosis (ICD10-I70.0). Coronary artery calcification. 4.  Emphysema (ICD10-J43.9).  CT abdomen 05/2021 IMPRESSION: 1. No acute process identified.  No hernia visualized. 2. Cholelithiasis. 3. Right renal calculus. 4. Mild colonic diverticulosis. 5. Marked prostatomegaly    Vascular/Lymphatic: Aortic atherosclerosis. No enlarged abdominal or pelvic lymph nodes     NST  12/04/22  Study Highlights Show Result Comparison    The study is normal. The study is low risk.   No ST deviation was noted.   LV perfusion is normal. There is no evidence of ischemia. There is no evidence of infarction.   Left ventricular function is normal. Nuclear stress EF: 57 %. The left ventricular ejection fraction is normal (55-65%). End diastolic cavity size is normal. End systolic cavity size is normal.   Prior study available for comparison from 06/03/2019.   Normal stress nuclear study with mild apical thinning but no ischemia.  Gated ejection fraction 57% with normal wall motion.   PLAN:  In order of problems listed above:  CAD status  post stent to the RCA in 2005 with residual disease on follow-up in 2006 in 2012 but patent RCA stent.  NST 12/102020  and  11/28/22 non ischemic with normal EF   Hypertension-BP controlled at home continue ramipril  given DM  HLD LDL 51 on 07/22/24 continue crestor    DM2-A1C much worse control lately A1c up to 10.5 f/u primary   Aortic atherosclerosis on chest and abdominal CT recently reviewed. No aneurysm. On a statin.   Pulmonary: Active smoker  with ILD, bullous emphysema and OSA F/U Dr Theophilus Has albuterol  inhaler Diagnosed with stage 2B non small cell cancer. Seeing oncology for neoadjuvant chemo   F/U in a year    Signed, Maude Emmer, MD  08/22/2024 10:06 AM    [facility] [address] [place], [place], [place]  [address] Phone: [phone]; Fax: [phone]    "

## 2024-08-22 ENCOUNTER — Encounter: Payer: Self-pay | Admitting: Cardiovascular Disease

## 2024-08-22 ENCOUNTER — Ambulatory Visit: Attending: Cardiovascular Disease | Admitting: Cardiovascular Disease

## 2024-08-22 VITALS — BP 133/72 | HR 92 | Resp 17 | Ht 70.0 in | Wt 198.0 lb

## 2024-08-22 DIAGNOSIS — E782 Mixed hyperlipidemia: Secondary | ICD-10-CM | POA: Diagnosis not present

## 2024-08-22 DIAGNOSIS — E1159 Type 2 diabetes mellitus with other circulatory complications: Secondary | ICD-10-CM

## 2024-08-22 DIAGNOSIS — I2510 Atherosclerotic heart disease of native coronary artery without angina pectoris: Secondary | ICD-10-CM | POA: Diagnosis not present

## 2024-08-22 DIAGNOSIS — I10 Essential (primary) hypertension: Secondary | ICD-10-CM | POA: Diagnosis not present

## 2024-08-22 NOTE — Patient Instructions (Signed)
 Medication Instructions:   Your physician recommends that you continue on your current medications as directed. Please refer to the Current Medication list given to you today.  *If you need a refill on your cardiac medications before your next appointment, please call your pharmacy*    Follow-Up: At [facility], you and your health needs are our priority.  As part of our continuing mission to provide you with exceptional heart care, our providers are all part of one team.  This team includes your primary Cardiologist (physician) and Advanced Practice Providers or APPs (Physician Assistants and Nurse Practitioners) who all work together to provide you with the care you need, when you need it.  Your next appointment:   1 year(s)  Provider:   Janelle Mediate, MD

## 2024-08-22 NOTE — Progress Notes (Signed)
 Pharmacist Chemotherapy Monitoring - Initial Assessment    Anticipated start date: 08/29/24   The following has been reviewed per standard work regarding the patient's treatment regimen: The patient's diagnosis, treatment plan and drug doses, and organ/hematologic function Lab orders and baseline tests specific to treatment regimen  The treatment plan start date, drug sequencing, and pre-medications Prior authorization status  Patient's documented medication list, including drug-drug interaction screen and prescriptions for anti-emetics and supportive care specific to the treatment regimen The drug concentrations, fluid compatibility, administration routes, and timing of the medications to be used The patient's access for treatment and lifetime cumulative dose history, if applicable  The patient's medication allergies and previous infusion related reactions, if applicable   Changes made to treatment plan:  Due to addition of nivolumab  to plan by MD: TSH/T4 added to pretreatment cycle & cycle 3 Offset times adjusted  Follow up needed:  N/A  Joe Green, RPH, 08/22/2024  11:43 AM

## 2024-08-26 ENCOUNTER — Inpatient Hospital Stay

## 2024-08-28 ENCOUNTER — Encounter (HOSPITAL_COMMUNITY): Payer: Self-pay

## 2024-08-28 MED FILL — Fosaprepitant Dimeglumine For IV Infusion 150 MG (Base Eq): INTRAVENOUS | Qty: 5 | Status: AC

## 2024-08-29 ENCOUNTER — Inpatient Hospital Stay

## 2024-08-29 VITALS — BP 130/63 | HR 67 | Temp 97.7°F | Resp 16 | Wt 194.5 lb

## 2024-08-29 DIAGNOSIS — Z5111 Encounter for antineoplastic chemotherapy: Secondary | ICD-10-CM | POA: Diagnosis not present

## 2024-08-29 DIAGNOSIS — C3412 Malignant neoplasm of upper lobe, left bronchus or lung: Secondary | ICD-10-CM

## 2024-08-29 LAB — CMP (CANCER CENTER ONLY)
ALT: 30 U/L (ref 0–44)
AST: 19 U/L (ref 15–41)
Albumin: 4.3 g/dL (ref 3.5–5.0)
Alkaline Phosphatase: 103 U/L (ref 38–126)
Anion gap: 11 (ref 5–15)
BUN: 10 mg/dL (ref 8–23)
CO2: 24 mmol/L (ref 22–32)
Calcium: 9.7 mg/dL (ref 8.9–10.3)
Chloride: 98 mmol/L (ref 98–111)
Creatinine: 0.83 mg/dL (ref 0.61–1.24)
GFR, Estimated: >60 mL/min
Glucose, Bld: 267 mg/dL — ABNORMAL HIGH (ref 70–99)
Potassium: 4.1 mmol/L (ref 3.5–5.1)
Sodium: 132 mmol/L — ABNORMAL LOW (ref 135–145)
Total Bilirubin: 0.5 mg/dL (ref 0.0–1.2)
Total Protein: 7 g/dL (ref 6.5–8.1)

## 2024-08-29 LAB — CBC WITH DIFFERENTIAL (CANCER CENTER ONLY)
Abs Immature Granulocytes: 0.01 K/uL (ref 0.00–0.07)
Basophils Absolute: 0 K/uL (ref 0.0–0.1)
Basophils Relative: 1 %
Eosinophils Absolute: 0.3 K/uL (ref 0.0–0.5)
Eosinophils Relative: 4 %
HCT: 41.4 % (ref 39.0–52.0)
Hemoglobin: 14.5 g/dL (ref 13.0–17.0)
Immature Granulocytes: 0 %
Lymphocytes Relative: 24 %
Lymphs Abs: 1.9 K/uL (ref 0.7–4.0)
MCH: 31 pg (ref 26.0–34.0)
MCHC: 35 g/dL (ref 30.0–36.0)
MCV: 88.7 fL (ref 80.0–100.0)
Monocytes Absolute: 0.6 K/uL (ref 0.1–1.0)
Monocytes Relative: 8 %
Neutro Abs: 4.9 K/uL (ref 1.7–7.7)
Neutrophils Relative %: 63 %
Platelet Count: 151 K/uL (ref 150–400)
RBC: 4.67 MIL/uL (ref 4.22–5.81)
RDW: 12 % (ref 11.5–15.5)
WBC Count: 7.7 K/uL (ref 4.0–10.5)
nRBC: 0 % (ref 0.0–0.2)

## 2024-08-29 LAB — MAGNESIUM: Magnesium: 1.6 mg/dL — ABNORMAL LOW (ref 1.7–2.4)

## 2024-08-29 MED ORDER — PALONOSETRON HCL INJECTION 0.25 MG/5ML
0.2500 mg | Freq: Once | INTRAVENOUS | Status: AC
  Administered 2024-08-29: 0.25 mg via INTRAVENOUS
  Filled 2024-08-29: qty 5

## 2024-08-29 MED ORDER — SODIUM CHLORIDE 0.9 % IV SOLN
150.0000 mg | Freq: Once | INTRAVENOUS | Status: AC
  Administered 2024-08-29: 150 mg via INTRAVENOUS
  Filled 2024-08-29: qty 150

## 2024-08-29 MED ORDER — POTASSIUM CHLORIDE IN NACL 20-0.9 MEQ/L-% IV SOLN
Freq: Once | INTRAVENOUS | Status: AC
  Filled 2024-08-29: qty 1000

## 2024-08-29 MED ORDER — MAGNESIUM SULFATE 2 GM/50ML IV SOLN
2.0000 g | Freq: Once | INTRAVENOUS | Status: AC
  Administered 2024-08-29: 2 g via INTRAVENOUS
  Filled 2024-08-29: qty 50

## 2024-08-29 MED ORDER — SODIUM CHLORIDE 0.9 % IV SOLN
75.0000 mg/m2 | Freq: Once | INTRAVENOUS | Status: AC
  Administered 2024-08-29: 150 mg via INTRAVENOUS
  Filled 2024-08-29: qty 150

## 2024-08-29 MED ORDER — SODIUM CHLORIDE 0.9 % IV SOLN
360.0000 mg | Freq: Once | INTRAVENOUS | Status: AC
  Administered 2024-08-29: 360 mg via INTRAVENOUS
  Filled 2024-08-29: qty 36

## 2024-08-29 MED ORDER — DEXAMETHASONE SOD PHOSPHATE PF 10 MG/ML IJ SOLN
10.0000 mg | Freq: Once | INTRAMUSCULAR | Status: AC
  Administered 2024-08-29: 10 mg via INTRAVENOUS
  Filled 2024-08-29: qty 1

## 2024-08-29 MED ORDER — SODIUM CHLORIDE 0.9 % IV SOLN
500.0000 mg/m2 | Freq: Once | INTRAVENOUS | Status: AC
  Administered 2024-08-29: 1100 mg via INTRAVENOUS
  Filled 2024-08-29: qty 40

## 2024-08-29 MED ORDER — SODIUM CHLORIDE 0.9 % IV SOLN: INTRAVENOUS | Status: DC

## 2024-08-29 NOTE — Patient Instructions (Signed)
 [facility] - A DEPT OF [facility]  Discharge Instructions: Thank you for choosing [facility] to provide your oncology and hematology care.   If you have a lab appointment with the Cancer Center, please go directly to the Cancer Center and check in at the registration area.   Wear comfortable clothing and clothing appropriate for easy access to any Portacath or PICC line.   We strive to give you quality time with your provider. You may need to reschedule your appointment if you arrive late (15 or more minutes).  Arriving late affects you and other patients whose appointments are after yours.  Also, if you miss three or more appointments without notifying the office, you may be dismissed from the clinic at the providers discretion.      For prescription refill requests, have your pharmacy contact our office and allow 72 hours for refills to be completed.    Today you received the following chemotherapy and/or immunotherapy agents: nivolumab , pemetrexed , and cisplatin       To help prevent nausea and vomiting after your treatment, we encourage you to take your nausea medication as directed.  BELOW ARE SYMPTOMS THAT SHOULD BE REPORTED IMMEDIATELY: *FEVER GREATER THAN 100.4 F (38 C) OR HIGHER *CHILLS OR SWEATING *NAUSEA AND VOMITING THAT IS NOT CONTROLLED WITH YOUR NAUSEA MEDICATION *UNUSUAL SHORTNESS OF BREATH *UNUSUAL BRUISING OR BLEEDING *URINARY PROBLEMS (pain or burning when urinating, or frequent urination) *BOWEL PROBLEMS (unusual diarrhea, constipation, pain near the anus) TENDERNESS IN MOUTH AND THROAT WITH OR WITHOUT PRESENCE OF ULCERS (sore throat, sores in mouth, or a toothache) UNUSUAL RASH, SWELLING OR PAIN  UNUSUAL VAGINAL DISCHARGE OR ITCHING   Items with * indicate a potential emergency and should be followed up as soon as possible or go to the Emergency Department if any problems should occur.  Please show the CHEMOTHERAPY  ALERT CARD or IMMUNOTHERAPY ALERT CARD at check-in to the Emergency Department and triage nurse.  Should you have questions after your visit or need to cancel or reschedule your appointment, please contact [facility] - A DEPT OF [facility]  Dept: [phone]  and follow the prompts.  Office hours are 8:00 a.m. to 4:30 p.m. Monday - Friday. Please note that voicemails left after 4:00 p.m. may not be returned until the following business day.  We are closed weekends and major holidays. You have access to a nurse at all times for urgent questions. Please call the main number to the clinic Dept: [phone] and follow the prompts.   For any non-urgent questions, you may also contact your provider using MyChart. We now offer e-Visits for anyone 80 and older to request care online for non-urgent symptoms. For details visit [email or web address].   Also download the MyChart app! Go to the app store, search MyChart, open the app, select [place], and log in with your MyChart username and password.

## 2024-09-01 ENCOUNTER — Telehealth: Payer: Self-pay

## 2024-09-01 NOTE — Telephone Encounter (Signed)
-----   Message from Nurse Bernardino DEL, RN sent at 08/29/2024  3:54 PM EST ----- Regarding: 1st Time Cisplatin  1st Time Cisplatin /Alimta /Nivolumab  Dr Jeannett patient. No issues during treatment.

## 2024-09-03 ENCOUNTER — Encounter: Payer: Self-pay | Admitting: Internal Medicine

## 2024-09-03 ENCOUNTER — Inpatient Hospital Stay

## 2024-09-03 ENCOUNTER — Inpatient Hospital Stay: Admitting: Internal Medicine

## 2024-09-03 VITALS — BP 107/62 | HR 95 | Temp 97.7°F | Resp 17 | Ht 70.0 in | Wt 190.0 lb

## 2024-09-03 DIAGNOSIS — C3412 Malignant neoplasm of upper lobe, left bronchus or lung: Secondary | ICD-10-CM

## 2024-09-03 DIAGNOSIS — Z5111 Encounter for antineoplastic chemotherapy: Secondary | ICD-10-CM | POA: Diagnosis not present

## 2024-09-03 LAB — CBC WITH DIFFERENTIAL (CANCER CENTER ONLY)
Abs Immature Granulocytes: 0.01 K/uL (ref 0.00–0.07)
Basophils Absolute: 0 K/uL (ref 0.0–0.1)
Basophils Relative: 0 %
Eosinophils Absolute: 0.4 K/uL (ref 0.0–0.5)
Eosinophils Relative: 5 %
HCT: 41.9 % (ref 39.0–52.0)
Hemoglobin: 14.7 g/dL (ref 13.0–17.0)
Immature Granulocytes: 0 %
Lymphocytes Relative: 17 %
Lymphs Abs: 1.2 K/uL (ref 0.7–4.0)
MCH: 31.5 pg (ref 26.0–34.0)
MCHC: 35.1 g/dL (ref 30.0–36.0)
MCV: 89.7 fL (ref 80.0–100.0)
Monocytes Absolute: 0.2 K/uL (ref 0.1–1.0)
Monocytes Relative: 2 %
Neutro Abs: 5.5 K/uL (ref 1.7–7.7)
Neutrophils Relative %: 76 %
Platelet Count: 140 K/uL — ABNORMAL LOW (ref 150–400)
RBC: 4.67 MIL/uL (ref 4.22–5.81)
RDW: 11.7 % (ref 11.5–15.5)
WBC Count: 7.2 K/uL (ref 4.0–10.5)
nRBC: 0 % (ref 0.0–0.2)

## 2024-09-03 LAB — CMP (CANCER CENTER ONLY)
ALT: 43 U/L (ref 0–44)
AST: 23 U/L (ref 15–41)
Albumin: 4.2 g/dL (ref 3.5–5.0)
Alkaline Phosphatase: 93 U/L (ref 38–126)
Anion gap: 12 (ref 5–15)
BUN: 15 mg/dL (ref 8–23)
CO2: 27 mmol/L (ref 22–32)
Calcium: 9.8 mg/dL (ref 8.9–10.3)
Chloride: 99 mmol/L (ref 98–111)
Creatinine: 1.05 mg/dL (ref 0.61–1.24)
GFR, Estimated: >60 mL/min
Glucose, Bld: 226 mg/dL — ABNORMAL HIGH (ref 70–99)
Potassium: 4.1 mmol/L (ref 3.5–5.1)
Sodium: 138 mmol/L (ref 135–145)
Total Bilirubin: 0.8 mg/dL (ref 0.0–1.2)
Total Protein: 6.9 g/dL (ref 6.5–8.1)

## 2024-09-03 LAB — MAGNESIUM: Magnesium: 1.6 mg/dL — ABNORMAL LOW (ref 1.7–2.4)

## 2024-09-03 NOTE — Progress Notes (Signed)
 "     [facility] [phone]   [phone]  OFFICE PROGRESS NOTE  [name], Lang DASEN, PA-C [address] [place] [address]  DIAGNOSIS: Stage IIb (T1b, N2a, M0) non-small cell lung cancer, adenocarcinoma presented with left lingular nodule in addition to left hilar and AP window lymphadenopathy diagnosed in November 2025.   PRIOR THERAPY: None  CURRENT THERAPY: Neoadjuvant systemic chemoimmunotherapy with cisplatin  75 MGs/M2, pemetrexed  500 mg/M2 and nivolumab  360 mg IV every 3 weeks.  Status post 1 cycle.  INTERVAL HISTORY: Joe Green 74 y.o. male returns to the clinic today for follow-up visit.Discussed the use of AI scribe software for clinical note transcription with the patient, who gave verbal consent to proceed.  History of Present Illness Joe Green is a 74 year old male with stage IIIB non-small cell lung cancer undergoing neoadjuvant chemoimmunotherapy who presents for evaluation of treatment-related adverse effects.  He has completed one cycle of cisplatin , pemetrexed , and nivolumab . During the first week post-infusion, he experienced increased fatigue, requiring significant rest, with mild improvement over the weekend followed by worsening symptoms during the subsequent week. He has remained quarantined at home and has been resting extensively.  He denies nausea, vomiting, and diarrhea. He has been compliant with Compazine  and folic acid , but has not yet taken two other prescribed medications. He endorses decreased appetite, stating his appetite has diminished compared to earlier in the treatment course, and he has not been eating well. His current weight is 190.4 lbs. He is experiencing difficulty maintaining adequate oral hydration, noting aversion to Gatorade and other beverages. He denies abnormal bleeding, bruising, fevers, chills, or new palpable masses.  He and his companion discussed the anticipated treatment  timeline, including interval imaging after neoadjuvant therapy, possible surgical resection, and the potential need for adjuvant therapy if the tumor is not resectable. He expressed concern regarding the timing of a planned vacation and the possibility of further therapy after surgery.      MEDICAL HISTORY: Past Medical History:  Diagnosis Date   Aortic atherosclerosis (HCC)by Chest CT scan 11/2016 12/30/2017   Per chest CT 11/2016     Arthritis    B12 deficiency 04/22/2020   BPH (benign prostatic hyperplasia) 10/05/2014   CAD (coronary artery disease)    a. s/p stent to RCA 2005; b. LHC 2006 after Nuc with inf ischemia: patent RCA stent, D1 80%, LAD 60% => med Rx;  c. LHC 04/18/11: Proximal LAD 60% (unchanged from previous catheterization), distal LAD 65% (small caliber), Dx 60%, proximal CFX 20%, mid CFX 50%, OM1 30%, proximal RCA 20%, mid RCA stent patent, EF 65%.=> med Rx   COPD (chronic obstructive pulmonary disease) (HCC) 10/05/2014   Gallstones 02/21/2021   GERD (gastroesophageal reflux disease)    Hepatic steatosis 02/17/2020   Per CT 02/2020     History of colonic polyps 04/23/2020   HLD (hyperlipidemia)    Hyperlipidemia associated with type 2 diabetes mellitus (HCC) 04/03/2011   Hypertension    ILD (interstitial lung disease) (HCC) 02/21/2021   Suggested by CT 02/2021 - pulmonology referral placed     Mini stroke 08/2023   [facility]   Obesity    OSA (obstructive sleep apnea)    has a cpap-does not use-has a special mouthpiece he wears at night   RLS (restless legs syndrome) 10/05/2014   Type 2 diabetes mellitus (HCC) 06/05/2014   Type II or unspecified type diabetes mellitus without mention of complication, not stated as uncontrolled  Vitamin D  deficiency    Wears glasses     ALLERGIES:  has no known allergies.  MEDICATIONS:  Current Outpatient Medications  Medication Sig Dispense Refill   ACCU-CHEK GUIDE TEST test strip 1 each 3 (three) times daily.      Accu-Chek Softclix Lancets lancets SMARTSIG:Topical     acetaminophen  (TYLENOL ) 500 MG tablet Take 2 tablets (1,000 mg total) by mouth every 6 (six) hours. 30 tablet 0   albuterol  (VENTOLIN  HFA) 108 (90 Base) MCG/ACT inhaler Inhale 2 puffs into the lungs every 6 (six) hours as needed for wheezing or shortness of breath. 8 g 2   Blood Glucose Monitoring Suppl DEVI 1 each by Does not apply route in the morning, at noon, and at bedtime. May substitute to any manufacturer covered by patient's insurance. 1 each 0   Cinnamon 500 MG TABS Take 500 mg by mouth daily. (Patient taking differently: Take 500 mg by mouth as needed.)     clopidogrel  (PLAVIX ) 75 MG tablet Take 1 tablet by mouth once daily 90 tablet 3   doxazosin  (CARDURA ) 8 MG tablet TAKE 1 TABLET BY MOUTH AT BEDTIME FOR BLOOD PRESSURE AND FOR PROSTATE 90 tablet 0   Dulaglutide  ([name] ) 4.5 MG/0.5ML SOAJ Inject 4.5 mg as directed once a week. 6 mL 1   fexofenadine (ALLEGRA) 60 MG tablet Take 60 mg by mouth daily as needed for allergies or rhinitis.     finasteride  (PROSCAR ) 5 MG tablet TAKE 1 TABLET BY MOUTH ONCE DAILY FOR PROSTATE 90 tablet 1   fluticasone  furoate-vilanterol (BREO ELLIPTA ) 100-25 MCG/ACT AEPB Use 1 inhalation Daily for COPD / Asthma 60 each 3   folic acid  (FOLVITE ) 1 MG tablet Take 1 tablet (1 mg total) by mouth daily. Start 7 days before pemetrexed  chemotherapy. Continue until 21 days after pemetrexed  completed. 100 tablet 3   glimepiride  (AMARYL ) 4 MG tablet TAKE 1/2 TO 1 (ONE-HALF TO ONE) TABLET BY MOUTH ONCE DAILY WITH SUPPER FOR DIABETES 90 tablet 2   metFORMIN  (GLUCOPHAGE -XR) 500 MG 24 hr tablet On week 1 take 2 tablets daily with breakfast (1,000 mg daily total), THEN on week 2 take 2 tablets with breakfast and 1 tablet with dinner (1,500 mg daily total), THEN on week 3 take 2 tablets daily with breakfast and 2 tablets daily with dinner (2,000 mg daily total). You will continue 4 tablets daily thereafter. (Patient taking  differently: Take 500 mg by mouth 2 (two) times daily with a meal. On week 1 take 2 tablets daily with breakfast (1,000 mg daily total), THEN on week 2 take 2 tablets with breakfast and 1 tablet with dinner (1,500 mg daily total), THEN on week 3 take 2 tablets daily with breakfast and 2 tablets daily with dinner (2,000 mg daily total). You will continue 4 tablets daily thereafter.) 120 tablet 6   nitroGLYCERIN  (NITROSTAT ) 0.4 MG SL tablet Place 1 tablet (0.4 mg total) under the tongue every 5 (five) minutes as needed for chest pain. 25 tablet 3   ondansetron  (ZOFRAN ) 8 MG tablet Take 1 tablet (8 mg total) by mouth every 8 (eight) hours as needed for nausea or vomiting. Start on the third day after cisplatin . 30 tablet 1   pramipexole  (MIRAPEX ) 1 MG tablet 1-2 tabs at bedtime 180 tablet 2   prochlorperazine  (COMPAZINE ) 10 MG tablet Take 1 tablet (10 mg total) by mouth every 6 (six) hours as needed for nausea or vomiting. 30 tablet 1   ramipril  (ALTACE ) 5 MG capsule TAKE  1 CAPSULE BY MOUTH ONCE DAILY FOR  BLOOD  PRESSURE  AND  DIABETIC  KIDNEY  PROTECTION 90 capsule 0   rosuvastatin  (CRESTOR ) 20 MG tablet TAKE 1 TABLET BY MOUTH THREE TIMES A WEEK FOR CHOLESTEROL 30 tablet 6   No current facility-administered medications for this visit.   Facility-Administered Medications Ordered in Other Visits  Medication Dose Route Frequency Provider Last Rate Last Admin   regadenoson  (LEXISCAN ) injection SOLN 0.4 mg  0.4 mg Intravenous Once Crenshaw, Brian S, MD       technetium tetrofosmin  (TC-MYOVIEW ) injection 32.3 millicurie  32.3 millicurie Intravenous Once PRN Pietro Redell RAMAN, MD        SURGICAL HISTORY:  Past Surgical History:  Procedure Laterality Date   CARDIAC CATHETERIZATION  2005   stent   CHOLECYSTECTOMY N/A 10/09/2023   Procedure: LAPAROSCOPIC CHOLECYSTECTOMY WITH INDOCYANINE GREEN  DYE;  Surgeon: Signe Mitzie LABOR, MD;  Location: [facility];  Service: General;  Laterality: N/A;   CHONDROPLASTY   06/25/2017   Procedure: CHONDROPLASTY;  Surgeon: Yvone Rush, MD;  Location: [facility];  Service: Orthopedics;;   COLONOSCOPY  11/2011   Due 5 years Dr. Aneita   heart stent  2005   RCA   INGUINAL HERNIA REPAIR Right 11/16/2014   Procedure: RIGHT INGUINAL HERNIA REPAIR WITH MESH;  Surgeon: Krystal Spinner, MD;  Location: [facility];  Service: General;  Laterality: Right;   INSERTION OF MESH Right 11/16/2014   Procedure: INSERTION OF MESH;  Surgeon: Krystal Spinner, MD;  Location: [facility];  Service: General;  Laterality: Right;   KNEE ARTHROSCOPY WITH MEDIAL MENISECTOMY Bilateral 06/25/2017   Procedure: right knee arthroscopy with medial meniscectomy, chondroplasty, Left knee arthroscopy with medial menisectomy removal of plica and chondroplasty;  Surgeon: Yvone Rush, MD;  Location: [facility];  Service: Orthopedics;  Laterality: Bilateral;   POLYPECTOMY     VIDEO BRONCHOSCOPY WITH ENDOBRONCHIAL NAVIGATION Left 06/24/2024   Procedure: VIDEO BRONCHOSCOPY WITH ENDOBRONCHIAL NAVIGATION;  Surgeon: Kara Dorn NOVAK, MD;  Location: [facility];  Service: Pulmonary;  Laterality: Left;   VIDEO BRONCHOSCOPY WITH ENDOBRONCHIAL ULTRASOUND Bilateral 06/24/2024   Procedure: BRONCHOSCOPY, WITH EBUS;  Surgeon: Kara Dorn NOVAK, MD;  Location: [facility];  Service: Pulmonary;  Laterality: Bilateral;    REVIEW OF SYSTEMS:  Constitutional: positive for fatigue Eyes: negative Ears, nose, mouth, throat, and face: negative Respiratory: negative Cardiovascular: negative Gastrointestinal: negative Genitourinary:negative Integument/breast: negative Hematologic/lymphatic: negative Musculoskeletal:negative Neurological: negative Behavioral/Psych: negative Endocrine: negative Allergic/Immunologic: negative   PHYSICAL EXAMINATION: General appearance: alert, cooperative, and no distress Head: Normocephalic, without obvious abnormality,  atraumatic Neck: no adenopathy, no JVD, supple, symmetrical, trachea midline, and thyroid  not enlarged, symmetric, no tenderness/mass/nodules Lymph nodes: Cervical, supraclavicular, and axillary nodes normal. Resp: clear to auscultation bilaterally Back: symmetric, no curvature. ROM normal. No CVA tenderness. Cardio: regular rate and rhythm, S1, S2 normal, no murmur, click, rub or gallop GI: soft, non-tender; bowel sounds normal; no masses,  no organomegaly Extremities: extremities normal, atraumatic, no cyanosis or edema Neurologic: Alert and oriented X 3, normal strength and tone. Normal symmetric reflexes. Normal coordination and gait  ECOG PERFORMANCE STATUS: 1 - Symptomatic but completely ambulatory  Blood pressure 107/62, pulse 95, temperature 97.7 F (36.5 C), temperature source Temporal, resp. rate 17, height 5' 10 (1.778 m), weight 190 lb (86.2 kg), SpO2 99%.  LABORATORY DATA: Lab Results  Component Value Date   WBC 7.2 09/03/2024   HGB 14.7 09/03/2024   HCT 41.9 09/03/2024  MCV 89.7 09/03/2024   PLT 140 (L) 09/03/2024      Chemistry      Component Value Date/Time   NA 132 (L) 08/29/2024 0800   NA 137 04/22/2024 1356   K 4.1 08/29/2024 0800   CL 98 08/29/2024 0800   CO2 24 08/29/2024 0800   BUN 10 08/29/2024 0800   BUN 8 04/22/2024 1356   CREATININE 0.83 08/29/2024 0800   CREATININE 0.79 08/30/2023 1404      Component Value Date/Time   CALCIUM  9.7 08/29/2024 0800   ALKPHOS 103 08/29/2024 0800   AST 19 08/29/2024 0800   ALT 30 08/29/2024 0800   BILITOT 0.5 08/29/2024 0800       RADIOGRAPHIC STUDIES: No results found.  ASSESSMENT AND PLAN: This is a 74 year old Stage IIb (T1b, N2a, M0) non-small cell lung cancer, adenocarcinoma presented with left lingular nodule in addition to left hilar and AP window lymphadenopathy diagnosed in November 2025.  He is currently undergoing neoadjuvant systemic chemoimmunotherapy with cisplatin  75 MGs/M2, pemetrexed  500  mg/M2 and nivolumab  360 mg IV every 3 weeks status post 1 cycle.  He tolerated the first cycle of his treatment fairly well except for fatigue. Assessment and Plan Assessment & Plan Stage IIb non-small cell lung cancer, adenocarcinoma Locally advanced disease managed with neoadjuvant chemoimmunotherapy. He has completed one cycle and is tolerating therapy without significant cytopenias. - Planned interval imaging with regular CT scan after completion of neoadjuvant treatment to assess response. - Provided anticipatory guidance regarding timing of surgery and potential need for adjuvant therapy based on response. - Scheduled follow-up visit in two weeks for cycle two.  Adverse effects of antineoplastic and immunotherapy drugs Experiencing expected mild adverse effects from first cycle, including fatigue, decreased appetite, and taste changes affecting oral hydration. No significant nausea, vomiting, or diarrhea. He remains compliant with antiemetics and folic acid . Blood counts are stable. Side effects are expected to improve in the coming weeks. - Reinforced importance of oral hydration to aid in clearance of chemotherapy agents. - Advised continuation of prescribed antiemetics (Compazine ) and folic acid . - Cancelled unnecessary repeat laboratory appointment scheduled for the following day. - Continued weekly laboratory monitoring as per protocol. - Provided anticipatory guidance regarding expected side effect trajectory and management. The patient was advised to call immediately if he has any concerning symptoms in the interval.  The patient voices understanding of current disease status and treatment options and is in agreement with the current care plan.  All questions were answered. The patient knows to call the clinic with any problems, questions or concerns. We can certainly see the patient much sooner if necessary. The total time spent in the appointment was 30 minutes including review of  chart and various tests results, discussions about plan of care and coordination of care plan .  Disclaimer: This note was dictated with voice recognition software. Similar sounding words can inadvertently be transcribed and may not be corrected upon review.        "

## 2024-09-04 ENCOUNTER — Inpatient Hospital Stay

## 2024-09-11 ENCOUNTER — Inpatient Hospital Stay

## 2024-09-11 DIAGNOSIS — Z5111 Encounter for antineoplastic chemotherapy: Secondary | ICD-10-CM | POA: Diagnosis not present

## 2024-09-11 DIAGNOSIS — C3412 Malignant neoplasm of upper lobe, left bronchus or lung: Secondary | ICD-10-CM

## 2024-09-11 LAB — CBC WITH DIFFERENTIAL (CANCER CENTER ONLY)
Abs Immature Granulocytes: 0.01 10*3/uL (ref 0.00–0.07)
Basophils Absolute: 0 10*3/uL (ref 0.0–0.1)
Basophils Relative: 0 %
Eosinophils Absolute: 0.3 10*3/uL (ref 0.0–0.5)
Eosinophils Relative: 7 %
HCT: 37.5 % — ABNORMAL LOW (ref 39.0–52.0)
Hemoglobin: 13 g/dL (ref 13.0–17.0)
Immature Granulocytes: 0 %
Lymphocytes Relative: 24 %
Lymphs Abs: 1.2 10*3/uL (ref 0.7–4.0)
MCH: 31.1 pg (ref 26.0–34.0)
MCHC: 34.7 g/dL (ref 30.0–36.0)
MCV: 89.7 fL (ref 80.0–100.0)
Monocytes Absolute: 0.6 10*3/uL (ref 0.1–1.0)
Monocytes Relative: 11 %
Neutro Abs: 2.9 10*3/uL (ref 1.7–7.7)
Neutrophils Relative %: 58 %
Platelet Count: 89 10*3/uL — ABNORMAL LOW (ref 150–400)
RBC: 4.18 MIL/uL — ABNORMAL LOW (ref 4.22–5.81)
RDW: 11.9 % (ref 11.5–15.5)
WBC Count: 5 10*3/uL (ref 4.0–10.5)
nRBC: 0 % (ref 0.0–0.2)

## 2024-09-11 LAB — MAGNESIUM: Magnesium: 1.6 mg/dL — ABNORMAL LOW (ref 1.7–2.4)

## 2024-09-11 LAB — CMP (CANCER CENTER ONLY)
ALT: 29 U/L (ref 0–44)
AST: 17 U/L (ref 15–41)
Albumin: 4.2 g/dL (ref 3.5–5.0)
Alkaline Phosphatase: 102 U/L (ref 38–126)
Anion gap: 11 (ref 5–15)
BUN: 9 mg/dL (ref 8–23)
CO2: 27 mmol/L (ref 22–32)
Calcium: 9.7 mg/dL (ref 8.9–10.3)
Chloride: 101 mmol/L (ref 98–111)
Creatinine: 0.79 mg/dL (ref 0.61–1.24)
GFR, Estimated: >60 mL/min
Glucose, Bld: 234 mg/dL — ABNORMAL HIGH (ref 70–99)
Potassium: 4.1 mmol/L (ref 3.5–5.1)
Sodium: 138 mmol/L (ref 135–145)
Total Bilirubin: 0.4 mg/dL (ref 0.0–1.2)
Total Protein: 6.7 g/dL (ref 6.5–8.1)

## 2024-09-16 ENCOUNTER — Encounter: Payer: Self-pay | Admitting: Internal Medicine

## 2024-09-17 ENCOUNTER — Inpatient Hospital Stay: Attending: Internal Medicine

## 2024-09-17 ENCOUNTER — Inpatient Hospital Stay: Attending: Internal Medicine | Admitting: Internal Medicine

## 2024-09-17 VITALS — BP 116/61 | HR 73 | Temp 97.7°F | Resp 17 | Ht 70.0 in | Wt 194.1 lb

## 2024-09-17 DIAGNOSIS — C3412 Malignant neoplasm of upper lobe, left bronchus or lung: Secondary | ICD-10-CM

## 2024-09-17 LAB — CBC WITH DIFFERENTIAL (CANCER CENTER ONLY)
Abs Immature Granulocytes: 0.01 10*3/uL (ref 0.00–0.07)
Basophils Absolute: 0 10*3/uL (ref 0.0–0.1)
Basophils Relative: 0 %
Eosinophils Absolute: 0.5 10*3/uL (ref 0.0–0.5)
Eosinophils Relative: 10 %
HCT: 37.8 % — ABNORMAL LOW (ref 39.0–52.0)
Hemoglobin: 13.1 g/dL (ref 13.0–17.0)
Immature Granulocytes: 0 %
Lymphocytes Relative: 27 %
Lymphs Abs: 1.3 10*3/uL (ref 0.7–4.0)
MCH: 31.3 pg (ref 26.0–34.0)
MCHC: 34.7 g/dL (ref 30.0–36.0)
MCV: 90.2 fL (ref 80.0–100.0)
Monocytes Absolute: 0.6 10*3/uL (ref 0.1–1.0)
Monocytes Relative: 14 %
Neutro Abs: 2.3 10*3/uL (ref 1.7–7.7)
Neutrophils Relative %: 49 %
Platelet Count: 135 10*3/uL — ABNORMAL LOW (ref 150–400)
RBC: 4.19 MIL/uL — ABNORMAL LOW (ref 4.22–5.81)
RDW: 12.4 % (ref 11.5–15.5)
WBC Count: 4.7 10*3/uL (ref 4.0–10.5)
nRBC: 0 % (ref 0.0–0.2)

## 2024-09-17 LAB — CMP (CANCER CENTER ONLY)
ALT: 28 U/L (ref 0–44)
AST: 20 U/L (ref 15–41)
Albumin: 4.3 g/dL (ref 3.5–5.0)
Alkaline Phosphatase: 101 U/L (ref 38–126)
Anion gap: 9 (ref 5–15)
BUN: 11 mg/dL (ref 8–23)
CO2: 25 mmol/L (ref 22–32)
Calcium: 9.2 mg/dL (ref 8.9–10.3)
Chloride: 102 mmol/L (ref 98–111)
Creatinine: 0.81 mg/dL (ref 0.61–1.24)
GFR, Estimated: >60 mL/min
Glucose, Bld: 197 mg/dL — ABNORMAL HIGH (ref 70–99)
Potassium: 4.5 mmol/L (ref 3.5–5.1)
Sodium: 137 mmol/L (ref 135–145)
Total Bilirubin: 0.4 mg/dL (ref 0.0–1.2)
Total Protein: 6.9 g/dL (ref 6.5–8.1)

## 2024-09-17 LAB — MAGNESIUM: Magnesium: 1.9 mg/dL (ref 1.7–2.4)

## 2024-09-17 MED FILL — Fosaprepitant Dimeglumine For IV Infusion 150 MG (Base Eq): INTRAVENOUS | Qty: 5 | Status: AC

## 2024-09-17 NOTE — Progress Notes (Signed)
 "     [facility] [phone]   [phone]  OFFICE PROGRESS NOTE  [name], Lang DASEN, PA-C [address] [employer] [place] [address]  DIAGNOSIS: Stage IIb (T1b, N2a, M0) non-small cell lung cancer, adenocarcinoma presented with left lingular nodule in addition to left hilar and AP window lymphadenopathy diagnosed in November 2025.   Biomarker Findings HRD signature - Cannot Be Determined Microsatellite status - Cannot Be Determined ? Tumor Mutational Burden - Cannot Be Determined Genomic Findings For a complete list of the genes assayed, please refer to the Appendix. TP53 [id number] Disease relevant genes with no reportable alterations: ALK, BRAF, EGFR, ERBB2, KRAS, MET, RET, ROS1  cMET 80%  PDL1 TPS: 1%  PRIOR THERAPY: None  CURRENT THERAPY: Neoadjuvant systemic chemoimmunotherapy with cisplatin  75 MGs/M2, pemetrexed  500 mg/M2 and nivolumab  360 mg IV every 3 weeks.  Status post 1 cycle.  INTERVAL HISTORY: Joe Green 74 y.o. male returns to the clinic today for follow-up visit.Discussed the use of AI scribe software for clinical note transcription with the patient, who gave verbal consent to proceed.  History of Present Illness Joe Green is a 74 year old male with stage IIb non-small cell lung cancer, adenocarcinoma, presenting for evaluation prior to cycle two of neoadjuvant chemoimmunotherapy.  He was diagnosed with stage IIb (T1b N2a M0) non-small cell lung cancer, adenocarcinoma, in November 2025. Tumor profiling revealed no actionable mutation, CMIT overexpression of 80%, and PD-L1 expression of 1%. He is currently receiving neoadjuvant cisplatin , pemetrexed , and nivolumab  every three weeks, and has completed one cycle to date.  Following the first cycle, he experienced fatigue and malaise for several days, beginning after the initial one to two days of feeling well. He had a single episode of epistaxis, which he attributed to  nasal dryness from home heating, without associated nasal drainage. He consumed a cup of beer nearly a week after treatment and inquired about the safety of alcohol use during therapy. No other significant symptoms were reported.  He expressed interest in the timing and risk of alopecia with his current regimen, as well as the duration of immunosuppression associated with treatment.        MEDICAL HISTORY: Past Medical History:  Diagnosis Date   Aortic atherosclerosis (HCC)by Chest CT scan 11/2016 12/30/2017   Per chest CT 11/2016     Arthritis    B12 deficiency 04/22/2020   BPH (benign prostatic hyperplasia) 10/05/2014   CAD (coronary artery disease)    a. s/p stent to RCA 2005; b. LHC 2006 after Nuc with inf ischemia: patent RCA stent, D1 80%, LAD 60% => med Rx;  c. LHC 04/18/11: Proximal LAD 60% (unchanged from previous catheterization), distal LAD 65% (small caliber), Dx 60%, proximal CFX 20%, mid CFX 50%, OM1 30%, proximal RCA 20%, mid RCA stent patent, EF 65%.=> med Rx   COPD (chronic obstructive pulmonary disease) (HCC) 10/05/2014   Gallstones 02/21/2021   GERD (gastroesophageal reflux disease)    Hepatic steatosis 02/17/2020   Per CT 02/2020     History of colonic polyps 04/23/2020   HLD (hyperlipidemia)    Hyperlipidemia associated with type 2 diabetes mellitus (HCC) 04/03/2011   Hypertension    ILD (interstitial lung disease) (HCC) 02/21/2021   Suggested by CT 02/2021 - pulmonology referral placed     Mini stroke 08/2023   [facility] [employer]   Obesity    OSA (obstructive sleep apnea)    has a cpap-does not use-has a special mouthpiece he wears  at night   RLS (restless legs syndrome) 10/05/2014   Type 2 diabetes mellitus (HCC) 06/05/2014   Type II or unspecified type diabetes mellitus without mention of complication, not stated as uncontrolled    Vitamin D  deficiency    Wears glasses     ALLERGIES:  has no known allergies.  MEDICATIONS:  Current Outpatient Medications   Medication Sig Dispense Refill   ACCU-CHEK GUIDE TEST test strip 1 each 3 (three) times daily.     Accu-Chek Softclix Lancets lancets SMARTSIG:Topical     acetaminophen  (TYLENOL ) 500 MG tablet Take 2 tablets (1,000 mg total) by mouth every 6 (six) hours. 30 tablet 0   albuterol  (VENTOLIN  HFA) 108 (90 Base) MCG/ACT inhaler Inhale 2 puffs into the lungs every 6 (six) hours as needed for wheezing or shortness of breath. 8 g 2   Blood Glucose Monitoring Suppl DEVI 1 each by Does not apply route in the morning, at noon, and at bedtime. May substitute to any manufacturer covered by patient's insurance. 1 each 0   Cinnamon 500 MG TABS Take 500 mg by mouth daily. (Patient taking differently: Take 500 mg by mouth as needed.)     clopidogrel  (PLAVIX ) 75 MG tablet Take 1 tablet by mouth once daily 90 tablet 3   doxazosin  (CARDURA ) 8 MG tablet TAKE 1 TABLET BY MOUTH AT BEDTIME FOR BLOOD PRESSURE AND FOR PROSTATE 90 tablet 0   Dulaglutide  ([name] ) 4.5 MG/0.5ML SOAJ Inject 4.5 mg as directed once a week. 6 mL 1   fexofenadine (ALLEGRA) 60 MG tablet Take 60 mg by mouth daily as needed for allergies or rhinitis.     finasteride  (PROSCAR ) 5 MG tablet TAKE 1 TABLET BY MOUTH ONCE DAILY FOR PROSTATE 90 tablet 1   fluticasone  furoate-vilanterol (BREO ELLIPTA ) 100-25 MCG/ACT AEPB Use 1 inhalation Daily for COPD / Asthma 60 each 3   folic acid  (FOLVITE ) 1 MG tablet Take 1 tablet (1 mg total) by mouth daily. Start 7 days before pemetrexed  chemotherapy. Continue until 21 days after pemetrexed  completed. 100 tablet 3   glimepiride  (AMARYL ) 4 MG tablet TAKE 1/2 TO 1 (ONE-HALF TO ONE) TABLET BY MOUTH ONCE DAILY WITH SUPPER FOR DIABETES 90 tablet 2   metFORMIN  (GLUCOPHAGE -XR) 500 MG 24 hr tablet On week 1 take 2 tablets daily with breakfast (1,000 mg daily total), THEN on week 2 take 2 tablets with breakfast and 1 tablet with dinner (1,500 mg daily total), THEN on week 3 take 2 tablets daily with breakfast and 2 tablets  daily with dinner (2,000 mg daily total). You will continue 4 tablets daily thereafter. (Patient taking differently: Take 500 mg by mouth 2 (two) times daily with a meal. On week 1 take 2 tablets daily with breakfast (1,000 mg daily total), THEN on week 2 take 2 tablets with breakfast and 1 tablet with dinner (1,500 mg daily total), THEN on week 3 take 2 tablets daily with breakfast and 2 tablets daily with dinner (2,000 mg daily total). You will continue 4 tablets daily thereafter.) 120 tablet 6   nitroGLYCERIN  (NITROSTAT ) 0.4 MG SL tablet Place 1 tablet (0.4 mg total) under the tongue every 5 (five) minutes as needed for chest pain. 25 tablet 3   ondansetron  (ZOFRAN ) 8 MG tablet Take 1 tablet (8 mg total) by mouth every 8 (eight) hours as needed for nausea or vomiting. Start on the third day after cisplatin . 30 tablet 1   pramipexole  (MIRAPEX ) 1 MG tablet 1-2 tabs at bedtime 180 tablet  2   prochlorperazine  (COMPAZINE ) 10 MG tablet Take 1 tablet (10 mg total) by mouth every 6 (six) hours as needed for nausea or vomiting. 30 tablet 1   ramipril  (ALTACE ) 5 MG capsule TAKE 1 CAPSULE BY MOUTH ONCE DAILY FOR  BLOOD  PRESSURE  AND  DIABETIC  KIDNEY  PROTECTION 90 capsule 0   rosuvastatin  (CRESTOR ) 20 MG tablet TAKE 1 TABLET BY MOUTH THREE TIMES A WEEK FOR CHOLESTEROL 30 tablet 6   No current facility-administered medications for this visit.   Facility-Administered Medications Ordered in Other Visits  Medication Dose Route Frequency Provider Last Rate Last Admin   regadenoson  (LEXISCAN ) injection SOLN 0.4 mg  0.4 mg Intravenous Once Crenshaw, Brian S, MD       technetium tetrofosmin  (TC-MYOVIEW ) injection 32.3 millicurie  32.3 millicurie Intravenous Once PRN Pietro Redell RAMAN, MD        SURGICAL HISTORY:  Past Surgical History:  Procedure Laterality Date   CARDIAC CATHETERIZATION  2005   stent   CHOLECYSTECTOMY N/A 10/09/2023   Procedure: LAPAROSCOPIC CHOLECYSTECTOMY WITH INDOCYANINE GREEN  DYE;   Surgeon: Signe Mitzie LABOR, MD;  Location: [facility];  Service: General;  Laterality: N/A;   CHONDROPLASTY  06/25/2017   Procedure: CHONDROPLASTY;  Surgeon: Yvone Rush, MD;  Location: [facility];  Service: Orthopedics;;   COLONOSCOPY  11/2011   Due 5 years Dr. Aneita   heart stent  2005   RCA   INGUINAL HERNIA REPAIR Right 11/16/2014   Procedure: RIGHT INGUINAL HERNIA REPAIR WITH MESH;  Surgeon: Krystal Spinner, MD;  Location: [facility];  Service: General;  Laterality: Right;   INSERTION OF MESH Right 11/16/2014   Procedure: INSERTION OF MESH;  Surgeon: Krystal Spinner, MD;  Location: [facility];  Service: General;  Laterality: Right;   KNEE ARTHROSCOPY WITH MEDIAL MENISECTOMY Bilateral 06/25/2017   Procedure: right knee arthroscopy with medial meniscectomy, chondroplasty, Left knee arthroscopy with medial menisectomy removal of plica and chondroplasty;  Surgeon: Yvone Rush, MD;  Location: [facility];  Service: Orthopedics;  Laterality: Bilateral;   POLYPECTOMY     VIDEO BRONCHOSCOPY WITH ENDOBRONCHIAL NAVIGATION Left 06/24/2024   Procedure: VIDEO BRONCHOSCOPY WITH ENDOBRONCHIAL NAVIGATION;  Surgeon: Kara Dorn NOVAK, MD;  Location[facility];  Service: Pulmonary;  Laterality: Left;   VIDEO BRONCHOSCOPY WITH ENDOBRONCHIAL ULTRASOUND Bilateral 06/24/2024   Procedure: BRONCHOSCOPY, WITH EBUS;  Surgeon: Kara Dorn NOVAK, MD;  Location: [facility];  Service: Pulmonary;  Laterality: Bilateral;    REVIEW OF SYSTEMS:  A comprehensive review of systems was negative except for: Constitutional: positive for fatigue   PHYSICAL EXAMINATION: General appearance: alert, cooperative, and no distress Head: Normocephalic, without obvious abnormality, atraumatic Neck: no adenopathy, no JVD, supple, symmetrical, trachea midline, and thyroid  not enlarged, symmetric, no tenderness/mass/nodules Lymph nodes: Cervical, supraclavicular, and axillary nodes  normal. Resp: clear to auscultation bilaterally Back: symmetric, no curvature. ROM normal. No CVA tenderness. Cardio: regular rate and rhythm, S1, S2 normal, no murmur, click, rub or gallop GI: soft, non-tender; bowel sounds normal; no masses,  no organomegaly Extremities: extremities normal, atraumatic, no cyanosis or edema  ECOG PERFORMANCE STATUS: 1 - Symptomatic but completely ambulatory  Blood pressure 116/61, pulse 73, temperature 97.7 F (36.5 C), temperature source Temporal, resp. rate 17, height 5' 10 (1.778 m), weight 194 lb 1.6 oz (88 kg), SpO2 97%.  LABORATORY DATA: Lab Results  Component Value Date   WBC 4.7 09/17/2024   HGB 13.1 09/17/2024   HCT 37.8 (L)  09/17/2024   MCV 90.2 09/17/2024   PLT 135 (L) 09/17/2024      Chemistry      Component Value Date/Time   NA 137 09/17/2024 0818   NA 137 04/22/2024 1356   K 4.5 09/17/2024 0818   CL 102 09/17/2024 0818   CO2 25 09/17/2024 0818   BUN 11 09/17/2024 0818   BUN 8 04/22/2024 1356   CREATININE 0.81 09/17/2024 0818   CREATININE 0.79 08/30/2023 1404      Component Value Date/Time   CALCIUM  9.2 09/17/2024 0818   ALKPHOS 101 09/17/2024 0818   AST 20 09/17/2024 0818   ALT 28 09/17/2024 0818   BILITOT 0.4 09/17/2024 0818       RADIOGRAPHIC STUDIES: No results found.  ASSESSMENT AND PLAN: This is a 74 year old Stage IIb (T1b, N2a, M0) non-small cell lung cancer, adenocarcinoma presented with left lingular nodule in addition to left hilar and AP window lymphadenopathy diagnosed in November 2025.  He is currently undergoing neoadjuvant systemic chemoimmunotherapy with cisplatin  75 MGs/M2, pemetrexed  500 mg/M2 and nivolumab  360 mg IV every 3 weeks status post 1 cycle.  He tolerated the first cycle of his treatment fairly well except for fatigue. Assessment and Plan Assessment & Plan Stage IIb non-small cell lung cancer, adenocarcinoma Undergoing neoadjuvant systemic chemoimmunotherapy with cisplatin , pemetrexed ,  and nivolumab  for stage IIb non-small cell lung cancer, adenocarcinoma. Tolerated first cycle with only mild fatigue and no significant complications. Laboratory values are sufficient to proceed. Prognosis is higher risk due to N2a involvement; response to neoadjuvant therapy and surgical resectability will determine long-term outlook. - Proceed with cycle two of cisplatin , pemetrexed , and nivolumab  as scheduled tomorrow. - Plan for a total of three cycles, then repeat CT imaging approximately six weeks after cycle 3 to assess response and surgical resectability. - Continue weekly laboratory monitoring during treatment for cytopenias and other toxicities; hold treatment if cytopenias or significant toxicity develop. - Monitor for treatment-related symptoms: fatigue, anorexia, dysgeusia, nausea, vomiting, diarrhea, epistaxis, infection, and alopecia. - Advised to avoid alcohol around treatment; occasional consumption a week or more after treatment is acceptable. - Provided anticipatory guidance regarding immunosuppression and avoidance of sick contacts. - Discussed timing of surgery and potential need for adjuvant therapy based on response to neoadjuvant therapy. - Follow-up visit scheduled in two weeks for next cycle.  Adverse effects of antineoplastic and immunotherapy drugs Reported mild fatigue, anorexia, and dysgeusia following first cycle, which are expected. Experienced epistaxis, likely due to nasal dryness from home heating rather than thrombocytopenia or other treatment-related causes. No evidence of severe or unexpected adverse effects. Blood counts remain within acceptable parameters for treatment continuation. - Recommended humidifier or saline nasal drops for nasal dryness to prevent further epistaxis. - Encouraged oral hydration and dietary modifications for anorexia and dysgeusia. - Continue weekly blood work and symptom review for adverse effects. - Reinforced anticipatory guidance  regarding signs of infection and when to seek medical attention. He was advised to call immediately if he has any other concerning symptoms in the interval.  The patient voices understanding of current disease status and treatment options and is in agreement with the current care plan.  All questions were answered. The patient knows to call the clinic with any problems, questions or concerns. We can certainly see the patient much sooner if necessary. The total time spent in the appointment was 30 minutes including review of chart and various tests results, discussions about plan of care and coordination of care plan .  Disclaimer: This note was dictated with voice recognition software. Similar sounding words can inadvertently be transcribed and may not be corrected upon review.        "

## 2024-09-18 ENCOUNTER — Inpatient Hospital Stay

## 2024-09-18 VITALS — BP 132/65 | HR 70 | Temp 97.7°F | Resp 16

## 2024-09-18 DIAGNOSIS — C3412 Malignant neoplasm of upper lobe, left bronchus or lung: Secondary | ICD-10-CM

## 2024-09-18 MED ORDER — SODIUM CHLORIDE 0.9 % IV SOLN
500.0000 mg/m2 | Freq: Once | INTRAVENOUS | Status: AC
  Administered 2024-09-18: 1100 mg via INTRAVENOUS
  Filled 2024-09-18: qty 40

## 2024-09-18 MED ORDER — SODIUM CHLORIDE 0.9 % IV SOLN
360.0000 mg | Freq: Once | INTRAVENOUS | Status: AC
  Administered 2024-09-18: 360 mg via INTRAVENOUS
  Filled 2024-09-18: qty 12

## 2024-09-18 MED ORDER — SODIUM CHLORIDE 0.9 % IV SOLN
150.0000 mg | Freq: Once | INTRAVENOUS | Status: AC
  Administered 2024-09-18: 150 mg via INTRAVENOUS
  Filled 2024-09-18: qty 150

## 2024-09-18 MED ORDER — SODIUM CHLORIDE 0.9 % IV SOLN
75.0000 mg/m2 | Freq: Once | INTRAVENOUS | Status: AC
  Administered 2024-09-18: 150 mg via INTRAVENOUS
  Filled 2024-09-18: qty 150

## 2024-09-18 MED ORDER — MAGNESIUM SULFATE 2 GM/50ML IV SOLN
2.0000 g | Freq: Once | INTRAVENOUS | Status: AC
  Administered 2024-09-18: 2 g via INTRAVENOUS
  Filled 2024-09-18: qty 50

## 2024-09-18 MED ORDER — DEXAMETHASONE SOD PHOSPHATE PF 10 MG/ML IJ SOLN
10.0000 mg | Freq: Once | INTRAMUSCULAR | Status: AC
  Administered 2024-09-18: 10 mg via INTRAVENOUS
  Filled 2024-09-18: qty 1

## 2024-09-18 MED ORDER — POTASSIUM CHLORIDE IN NACL 20-0.9 MEQ/L-% IV SOLN
Freq: Once | INTRAVENOUS | Status: AC
  Filled 2024-09-18: qty 1000

## 2024-09-18 MED ORDER — SODIUM CHLORIDE 0.9 % IV SOLN: INTRAVENOUS | Status: DC

## 2024-09-18 MED ORDER — PALONOSETRON HCL INJECTION 0.25 MG/5ML
0.2500 mg | Freq: Once | INTRAVENOUS | Status: AC
  Administered 2024-09-18: 0.25 mg via INTRAVENOUS
  Filled 2024-09-18: qty 5

## 2024-09-18 NOTE — Patient Instructions (Signed)
 [facility] - A DEPT OF [facility]  Discharge Instructions: Thank you for choosing [facility] to provide your oncology and hematology care.   If you have a lab appointment with the Cancer Center, please go directly to the Cancer Center and check in at the registration area.   Wear comfortable clothing and clothing appropriate for easy access to any Portacath or PICC line.   We strive to give you quality time with your provider. You may need to reschedule your appointment if you arrive late (15 or more minutes).  Arriving late affects you and other patients whose appointments are after yours.  Also, if you miss three or more appointments without notifying the office, you may be dismissed from the clinic at the providers discretion.      For prescription refill requests, have your pharmacy contact our office and allow 72 hours for refills to be completed.    Today you received the following chemotherapy and/or immunotherapy agents: nivolumab , pemetrexed , and cisplatin       To help prevent nausea and vomiting after your treatment, we encourage you to take your nausea medication as directed.  BELOW ARE SYMPTOMS THAT SHOULD BE REPORTED IMMEDIATELY: *FEVER GREATER THAN 100.4 F (38 C) OR HIGHER *CHILLS OR SWEATING *NAUSEA AND VOMITING THAT IS NOT CONTROLLED WITH YOUR NAUSEA MEDICATION *UNUSUAL SHORTNESS OF BREATH *UNUSUAL BRUISING OR BLEEDING *URINARY PROBLEMS (pain or burning when urinating, or frequent urination) *BOWEL PROBLEMS (unusual diarrhea, constipation, pain near the anus) TENDERNESS IN MOUTH AND THROAT WITH OR WITHOUT PRESENCE OF ULCERS (sore throat, sores in mouth, or a toothache) UNUSUAL RASH, SWELLING OR PAIN  UNUSUAL VAGINAL DISCHARGE OR ITCHING   Items with * indicate a potential emergency and should be followed up as soon as possible or go to the Emergency Department if any problems should occur.  Please show the CHEMOTHERAPY  ALERT CARD or IMMUNOTHERAPY ALERT CARD at check-in to the Emergency Department and triage nurse.  Should you have questions after your visit or need to cancel or reschedule your appointment, please contact [facility] - A DEPT OF [facility]  Dept: [phone]  and follow the prompts.  Office hours are 8:00 a.m. to 4:30 p.m. Monday - Friday. Please note that voicemails left after 4:00 p.m. may not be returned until the following business day.  We are closed weekends and major holidays. You have access to a nurse at all times for urgent questions. Please call the main number to the clinic Dept: [phone] and follow the prompts.   For any non-urgent questions, you may also contact your provider using MyChart. We now offer e-Visits for anyone 80 and older to request care online for non-urgent symptoms. For details visit [email or web address].   Also download the MyChart app! Go to the app store, search MyChart, open the app, select [place], and log in with your MyChart username and password.

## 2024-09-25 ENCOUNTER — Inpatient Hospital Stay

## 2024-10-02 ENCOUNTER — Inpatient Hospital Stay

## 2024-10-09 ENCOUNTER — Inpatient Hospital Stay: Admitting: Internal Medicine

## 2024-10-09 ENCOUNTER — Inpatient Hospital Stay

## 2024-10-22 ENCOUNTER — Ambulatory Visit (HOSPITAL_BASED_OUTPATIENT_CLINIC_OR_DEPARTMENT_OTHER): Admitting: Student
